# Patient Record
Sex: Female | Born: 1950 | Race: Black or African American | Hispanic: No | Marital: Single | Smoking: Current every day smoker
Health system: Southern US, Community
[De-identification: ages and names within clinical notes are randomized; demographics above are authoritative.]

## PROBLEM LIST (undated history)

## (undated) DIAGNOSIS — Z5189 Encounter for other specified aftercare: Secondary | ICD-10-CM

## (undated) DIAGNOSIS — Z853 Personal history of malignant neoplasm of breast: Secondary | ICD-10-CM

## (undated) DIAGNOSIS — J439 Emphysema, unspecified: Secondary | ICD-10-CM

## (undated) DIAGNOSIS — F419 Anxiety disorder, unspecified: Secondary | ICD-10-CM

## (undated) DIAGNOSIS — IMO0001 Reserved for inherently not codable concepts without codable children: Secondary | ICD-10-CM

## (undated) DIAGNOSIS — D649 Anemia, unspecified: Secondary | ICD-10-CM

## (undated) HISTORY — PX: NEPHRECTOMY: SHX65

## (undated) HISTORY — DX: Emphysema, unspecified: J43.9

## (undated) HISTORY — DX: Personal history of malignant neoplasm of breast: Z85.3

## (undated) HISTORY — DX: Anemia, unspecified: D64.9

## (undated) HISTORY — DX: Encounter for other specified aftercare: Z51.89

---

## 1978-04-12 HISTORY — PX: OTHER SURGICAL HISTORY: SHX169

## 1993-04-12 HISTORY — PX: ABDOMINAL HYSTERECTOMY: SHX81

## 1994-04-12 HISTORY — PX: BREAST SURGERY: SHX581

## 1995-04-13 HISTORY — PX: OTHER SURGICAL HISTORY: SHX169

## 2011-04-13 DIAGNOSIS — IMO0001 Reserved for inherently not codable concepts without codable children: Secondary | ICD-10-CM

## 2011-04-13 HISTORY — DX: Reserved for inherently not codable concepts without codable children: IMO0001

## 2012-04-12 ENCOUNTER — Ambulatory Visit: Payer: Self-pay | Admitting: Hematology and Oncology

## 2012-04-12 HISTORY — PX: BREAST BIOPSY: SHX20

## 2012-04-27 ENCOUNTER — Emergency Department: Payer: Self-pay | Admitting: Emergency Medicine

## 2012-04-27 LAB — URINALYSIS, COMPLETE
Bacteria: NONE SEEN
Bilirubin,UR: NEGATIVE
Ketone: NEGATIVE
Leukocyte Esterase: NEGATIVE
RBC,UR: 1 /HPF (ref 0–5)
Specific Gravity: 1.005 (ref 1.003–1.030)

## 2012-04-27 LAB — CBC WITH DIFFERENTIAL/PLATELET
Comment - H1-Com1: NORMAL
Comment - H1-Com3: NORMAL
HCT: 45.4 % (ref 35.0–47.0)
MCH: 29.2 pg (ref 26.0–34.0)
MCHC: 33.9 g/dL (ref 32.0–36.0)
MCV: 86 fL (ref 80–100)
Monocytes: 13 %
RBC: 5.27 10*6/uL — ABNORMAL HIGH (ref 3.80–5.20)
RDW: 13.9 % (ref 11.5–14.5)
Segmented Neutrophils: 58 %

## 2012-04-27 LAB — BASIC METABOLIC PANEL
Anion Gap: 6 — ABNORMAL LOW (ref 7–16)
BUN: 17 mg/dL (ref 7–18)
Chloride: 106 mmol/L (ref 98–107)
Co2: 27 mmol/L (ref 21–32)
EGFR (African American): >60
EGFR (Non-African Amer.): >60
Glucose: 95 mg/dL (ref 65–99)
Osmolality: 279 (ref 275–301)
Potassium: 3.8 mmol/L (ref 3.5–5.1)
Sodium: 139 mmol/L (ref 136–145)

## 2012-04-29 LAB — URINE CULTURE

## 2012-05-01 ENCOUNTER — Ambulatory Visit: Payer: Self-pay | Admitting: Oncology

## 2012-05-03 LAB — CULTURE, BLOOD (SINGLE)

## 2012-05-13 ENCOUNTER — Ambulatory Visit: Payer: Self-pay | Admitting: Oncology

## 2012-05-16 LAB — CBC CANCER CENTER
Basophil #: 0.1 x10 3/mm (ref 0.0–0.1)
Basophil %: 1.3 %
Eosinophil #: 0.1 x10 3/mm (ref 0.0–0.7)
Eosinophil %: 1.3 %
Monocyte #: 0.7 x10 3/mm (ref 0.2–0.9)
Monocyte %: 12.3 %
Platelet: 210 x10 3/mm (ref 150–440)
RBC: 5.24 10*6/uL — ABNORMAL HIGH (ref 3.80–5.20)
RDW: 14.2 % (ref 11.5–14.5)

## 2012-05-16 LAB — COMPREHENSIVE METABOLIC PANEL
Alkaline Phosphatase: 68 U/L (ref 50–136)
Calcium, Total: 9.7 mg/dL (ref 8.5–10.1)
Chloride: 102 mmol/L (ref 98–107)
Co2: 29 mmol/L (ref 21–32)
EGFR (Non-African Amer.): >60
Glucose: 100 mg/dL — ABNORMAL HIGH (ref 65–99)
SGOT(AST): 20 U/L (ref 15–37)
SGPT (ALT): 25 U/L (ref 12–78)
Sodium: 141 mmol/L (ref 136–145)
Total Protein: 8.2 g/dL (ref 6.4–8.2)

## 2012-05-22 ENCOUNTER — Ambulatory Visit: Payer: Self-pay | Admitting: Hematology and Oncology

## 2012-05-30 LAB — CBC CANCER CENTER
Basophil %: 1.2 %
Eosinophil #: 0.1 x10 3/mm (ref 0.0–0.7)
HGB: 14.7 g/dL (ref 12.0–16.0)
Lymphocyte #: 1.4 x10 3/mm (ref 1.0–3.6)
MCH: 28.6 pg (ref 26.0–34.0)
MCHC: 33.1 g/dL (ref 32.0–36.0)
Neutrophil #: 2.4 x10 3/mm (ref 1.4–6.5)
Neutrophil %: 54.7 %
RDW: 14.2 % (ref 11.5–14.5)

## 2012-05-30 LAB — COMPREHENSIVE METABOLIC PANEL
Albumin: 3.7 g/dL (ref 3.4–5.0)
Alkaline Phosphatase: 67 U/L (ref 50–136)
Anion Gap: 8 (ref 7–16)
BUN: 15 mg/dL (ref 7–18)
Co2: 29 mmol/L (ref 21–32)
EGFR (African American): >60
Glucose: 121 mg/dL — ABNORMAL HIGH (ref 65–99)
Osmolality: 283 (ref 275–301)
Potassium: 4.4 mmol/L (ref 3.5–5.1)
SGOT(AST): 24 U/L (ref 15–37)
SGPT (ALT): 30 U/L (ref 12–78)
Sodium: 141 mmol/L (ref 136–145)
Total Protein: 7.7 g/dL (ref 6.4–8.2)

## 2012-05-30 LAB — PROTIME-INR
INR: 0.9
Prothrombin Time: 12.1 secs (ref 11.5–14.7)

## 2012-05-31 ENCOUNTER — Encounter: Payer: Self-pay | Admitting: Nurse Practitioner

## 2012-05-31 ENCOUNTER — Encounter: Payer: Self-pay | Admitting: Cardiothoracic Surgery

## 2012-06-05 ENCOUNTER — Ambulatory Visit: Payer: Self-pay | Admitting: Vascular Surgery

## 2012-06-06 ENCOUNTER — Ambulatory Visit: Payer: Self-pay | Admitting: Hematology and Oncology

## 2012-06-06 LAB — COMPREHENSIVE METABOLIC PANEL
Alkaline Phosphatase: 67 U/L (ref 50–136)
Anion Gap: 10 (ref 7–16)
BUN: 10 mg/dL (ref 7–18)
Bilirubin,Total: 0.3 mg/dL (ref 0.2–1.0)
Calcium, Total: 8.9 mg/dL (ref 8.5–10.1)
Chloride: 104 mmol/L (ref 98–107)
Glucose: 107 mg/dL — ABNORMAL HIGH (ref 65–99)
Osmolality: 283 (ref 275–301)
SGOT(AST): 16 U/L (ref 15–37)
Sodium: 142 mmol/L (ref 136–145)
Total Protein: 7.5 g/dL (ref 6.4–8.2)

## 2012-06-06 LAB — CBC CANCER CENTER
Basophil #: 0.1 x10 3/mm (ref 0.0–0.1)
Basophil %: 1.1 %
Eosinophil %: 1.3 %
HCT: 42.7 % (ref 35.0–47.0)
Lymphocyte #: 1.8 x10 3/mm (ref 1.0–3.6)
Lymphocyte %: 34.7 %
Monocyte #: 0.6 x10 3/mm (ref 0.2–0.9)
Monocyte %: 11.1 %
Neutrophil #: 2.7 x10 3/mm (ref 1.4–6.5)
Platelet: 195 x10 3/mm (ref 150–440)
RDW: 14 % (ref 11.5–14.5)

## 2012-06-10 ENCOUNTER — Ambulatory Visit: Payer: Self-pay | Admitting: Oncology

## 2012-06-10 ENCOUNTER — Encounter: Payer: Self-pay | Admitting: Cardiothoracic Surgery

## 2012-06-10 ENCOUNTER — Encounter: Payer: Self-pay | Admitting: Nurse Practitioner

## 2012-06-21 LAB — CBC CANCER CENTER
Basophil #: 0 x10 3/mm (ref 0.0–0.1)
Eosinophil #: 0 x10 3/mm (ref 0.0–0.7)
Lymphocyte #: 2.4 x10 3/mm (ref 1.0–3.6)
Lymphocyte %: 38.9 %
MCH: 29.3 pg (ref 26.0–34.0)
MCV: 87 fL (ref 80–100)
Neutrophil %: 44.7 %
Platelet: 182 x10 3/mm (ref 150–440)

## 2012-06-21 LAB — COMPREHENSIVE METABOLIC PANEL
Albumin: 3.9 g/dL (ref 3.4–5.0)
Anion Gap: 6 — ABNORMAL LOW (ref 7–16)
Chloride: 106 mmol/L (ref 98–107)
Creatinine: 0.87 mg/dL (ref 0.60–1.30)
EGFR (African American): >60
Glucose: 81 mg/dL (ref 65–99)
SGPT (ALT): 71 U/L (ref 12–78)
Total Protein: 7.9 g/dL (ref 6.4–8.2)

## 2012-06-27 LAB — COMPREHENSIVE METABOLIC PANEL
Albumin: 3.7 g/dL (ref 3.4–5.0)
Alkaline Phosphatase: 72 U/L (ref 50–136)
Calcium, Total: 9 mg/dL (ref 8.5–10.1)
Chloride: 103 mmol/L (ref 98–107)
Co2: 28 mmol/L (ref 21–32)
Creatinine: 1 mg/dL (ref 0.60–1.30)
EGFR (African American): >60
EGFR (Non-African Amer.): >60
SGOT(AST): 16 U/L (ref 15–37)
Sodium: 140 mmol/L (ref 136–145)

## 2012-06-27 LAB — CBC CANCER CENTER
Basophil %: 1.4 %
HCT: 41.4 % (ref 35.0–47.0)
HGB: 13.8 g/dL (ref 12.0–16.0)
MCHC: 33.4 g/dL (ref 32.0–36.0)
MCV: 85 fL (ref 80–100)
Monocyte #: 0.9 x10 3/mm (ref 0.2–0.9)
Monocyte %: 19.5 %
Platelet: 256 x10 3/mm (ref 150–440)
RBC: 4.87 10*6/uL (ref 3.80–5.20)
RDW: 15.3 % — ABNORMAL HIGH (ref 11.5–14.5)
WBC: 4.7 x10 3/mm (ref 3.6–11.0)

## 2012-07-11 ENCOUNTER — Encounter: Payer: Self-pay | Admitting: Cardiothoracic Surgery

## 2012-07-11 ENCOUNTER — Ambulatory Visit: Payer: Self-pay | Admitting: Oncology

## 2012-07-11 ENCOUNTER — Encounter: Payer: Self-pay | Admitting: Nurse Practitioner

## 2012-07-18 LAB — CBC CANCER CENTER
Basophil %: 1 %
Eosinophil %: 0.4 %
HCT: 39.7 % (ref 35.0–47.0)
HGB: 13.2 g/dL (ref 12.0–16.0)
Lymphocyte #: 1.3 x10 3/mm (ref 1.0–3.6)
Lymphocyte %: 27.9 %
MCHC: 33.3 g/dL (ref 32.0–36.0)
Monocyte #: 0.9 x10 3/mm (ref 0.2–0.9)
Neutrophil #: 2.5 x10 3/mm (ref 1.4–6.5)
Neutrophil %: 51.8 %
Platelet: 236 x10 3/mm (ref 150–440)
RDW: 15.1 % — ABNORMAL HIGH (ref 11.5–14.5)

## 2012-07-18 LAB — COMPREHENSIVE METABOLIC PANEL
Anion Gap: 8 (ref 7–16)
BUN: 15 mg/dL (ref 7–18)
Bilirubin,Total: 0.3 mg/dL (ref 0.2–1.0)
Chloride: 103 mmol/L (ref 98–107)
Co2: 29 mmol/L (ref 21–32)
Creatinine: 1.19 mg/dL (ref 0.60–1.30)
EGFR (Non-African Amer.): 50 — ABNORMAL LOW
Osmolality: 280 (ref 275–301)
Potassium: 3.6 mmol/L (ref 3.5–5.1)
SGOT(AST): 20 U/L (ref 15–37)
SGPT (ALT): 28 U/L (ref 12–78)
Sodium: 140 mmol/L (ref 136–145)
Total Protein: 7.2 g/dL (ref 6.4–8.2)

## 2012-07-26 LAB — CBC CANCER CENTER
Basophil %: 1.5 %
Eosinophil #: 0 x10 3/mm (ref 0.0–0.7)
Lymphocyte %: 76.3 %
RBC: 4.59 10*6/uL (ref 3.80–5.20)
RDW: 15.2 % — ABNORMAL HIGH (ref 11.5–14.5)
WBC: 1.4 x10 3/mm — CL (ref 3.6–11.0)

## 2012-07-26 LAB — BASIC METABOLIC PANEL
Anion Gap: 3 — ABNORMAL LOW (ref 7–16)
BUN: 14 mg/dL (ref 7–18)
Calcium, Total: 8.6 mg/dL (ref 8.5–10.1)
Chloride: 108 mmol/L — ABNORMAL HIGH (ref 98–107)
Co2: 29 mmol/L (ref 21–32)
Creatinine: 0.9 mg/dL (ref 0.60–1.30)
EGFR (African American): >60
EGFR (Non-African Amer.): >60
Glucose: 116 mg/dL — ABNORMAL HIGH (ref 65–99)
Sodium: 140 mmol/L (ref 136–145)

## 2012-08-08 LAB — CBC CANCER CENTER
Basophil #: 0.1 x10 3/mm (ref 0.0–0.1)
Basophil %: 1.1 %
Eosinophil %: 0.7 %
HCT: 40.6 % (ref 35.0–47.0)
Lymphocyte #: 1.5 x10 3/mm (ref 1.0–3.6)
Lymphocyte %: 30.5 %
MCH: 27.9 pg (ref 26.0–34.0)
MCV: 85 fL (ref 80–100)
Neutrophil %: 49.1 %
RBC: 4.75 10*6/uL (ref 3.80–5.20)
WBC: 4.9 x10 3/mm (ref 3.6–11.0)

## 2012-08-08 LAB — COMPREHENSIVE METABOLIC PANEL
Albumin: 3.4 g/dL (ref 3.4–5.0)
Anion Gap: 4 — ABNORMAL LOW (ref 7–16)
Bilirubin,Total: 0.2 mg/dL (ref 0.2–1.0)
Calcium, Total: 8.8 mg/dL (ref 8.5–10.1)
Co2: 29 mmol/L (ref 21–32)
EGFR (African American): >60
EGFR (Non-African Amer.): >60
Osmolality: 281 (ref 275–301)
Potassium: 3.7 mmol/L (ref 3.5–5.1)
SGOT(AST): 21 U/L (ref 15–37)
SGPT (ALT): 28 U/L (ref 12–78)
Sodium: 141 mmol/L (ref 136–145)
Total Protein: 6.9 g/dL (ref 6.4–8.2)

## 2012-08-10 ENCOUNTER — Ambulatory Visit: Payer: Self-pay | Admitting: Oncology

## 2012-08-29 ENCOUNTER — Ambulatory Visit: Payer: Self-pay | Admitting: Hematology and Oncology

## 2012-08-30 LAB — CBC CANCER CENTER
Eosinophil #: 0 x10 3/mm (ref 0.0–0.7)
Eosinophil %: 0.5 %
HCT: 39.2 % (ref 35.0–47.0)
HGB: 13.1 g/dL (ref 12.0–16.0)
Lymphocyte #: 1.6 x10 3/mm (ref 1.0–3.6)
Lymphocyte %: 26 %
MCHC: 33.5 g/dL (ref 32.0–36.0)
MCV: 84 fL (ref 80–100)
Monocyte %: 15.3 %
Neutrophil #: 3.6 x10 3/mm (ref 1.4–6.5)
Platelet: 246 x10 3/mm (ref 150–440)
RDW: 15.7 % — ABNORMAL HIGH (ref 11.5–14.5)
WBC: 6.3 x10 3/mm (ref 3.6–11.0)

## 2012-08-30 LAB — COMPREHENSIVE METABOLIC PANEL
Anion Gap: 10 (ref 7–16)
Bilirubin,Total: 0.3 mg/dL (ref 0.2–1.0)
Calcium, Total: 9.1 mg/dL (ref 8.5–10.1)
Chloride: 105 mmol/L (ref 98–107)
Osmolality: 284 (ref 275–301)
Potassium: 3.8 mmol/L (ref 3.5–5.1)
SGOT(AST): 17 U/L (ref 15–37)
Sodium: 142 mmol/L (ref 136–145)

## 2012-09-07 ENCOUNTER — Ambulatory Visit: Payer: Self-pay | Admitting: Hematology and Oncology

## 2012-09-08 LAB — CBC CANCER CENTER
Basophil %: 1.3 %
Eosinophil #: 0.3 x10 3/mm (ref 0.0–0.7)
Eosinophil %: 7.6 %
HGB: 12.4 g/dL (ref 12.0–16.0)
Lymphocyte %: 32.8 %
MCH: 28.2 pg (ref 26.0–34.0)
MCV: 85 fL (ref 80–100)
Monocyte %: 16.6 %
Neutrophil #: 1.7 x10 3/mm (ref 1.4–6.5)
Platelet: 195 x10 3/mm (ref 150–440)
RBC: 4.41 10*6/uL (ref 3.80–5.20)
RDW: 16.1 % — ABNORMAL HIGH (ref 11.5–14.5)

## 2012-09-08 LAB — BASIC METABOLIC PANEL
Anion Gap: 8 (ref 7–16)
BUN: 15 mg/dL (ref 7–18)
Chloride: 106 mmol/L (ref 98–107)
Co2: 28 mmol/L (ref 21–32)
EGFR (Non-African Amer.): >60
Potassium: 3.5 mmol/L (ref 3.5–5.1)

## 2012-09-10 ENCOUNTER — Ambulatory Visit: Payer: Self-pay | Admitting: Oncology

## 2012-09-14 LAB — BASIC METABOLIC PANEL
Anion Gap: 11 (ref 7–16)
BUN: 15 mg/dL (ref 7–18)
Calcium, Total: 9 mg/dL (ref 8.5–10.1)
Chloride: 101 mmol/L (ref 98–107)
Co2: 27 mmol/L (ref 21–32)
Creatinine: 1.03 mg/dL (ref 0.60–1.30)
Glucose: 91 mg/dL (ref 65–99)
Osmolality: 278 (ref 275–301)
Potassium: 3.7 mmol/L (ref 3.5–5.1)

## 2012-09-14 LAB — CBC CANCER CENTER
HCT: 36.4 % (ref 35.0–47.0)
Lymphocyte %: 60.8 %
MCH: 29.3 pg (ref 26.0–34.0)
MCHC: 34.8 g/dL (ref 32.0–36.0)
Monocyte #: 0.1 x10 3/mm — ABNORMAL LOW (ref 0.2–0.9)
Monocyte %: 4.2 %
Neutrophil #: 0.5 x10 3/mm — ABNORMAL LOW (ref 1.4–6.5)
Neutrophil %: 27.8 %
RDW: 16 % — ABNORMAL HIGH (ref 11.5–14.5)
WBC: 1.8 x10 3/mm — CL (ref 3.6–11.0)

## 2012-09-29 LAB — COMPREHENSIVE METABOLIC PANEL WITH GFR
Albumin: 3.3 g/dL — ABNORMAL LOW
Alkaline Phosphatase: 76 U/L
Anion Gap: 6 — ABNORMAL LOW
BUN: 16 mg/dL
Bilirubin,Total: 0.2 mg/dL
Calcium, Total: 8.8 mg/dL
Chloride: 105 mmol/L
Co2: 29 mmol/L
Creatinine: 1.17 mg/dL
EGFR (African American): 59 — ABNORMAL LOW
EGFR (Non-African Amer.): 51 — ABNORMAL LOW
Glucose: 110 mg/dL — ABNORMAL HIGH
Osmolality: 281
Potassium: 3.3 mmol/L — ABNORMAL LOW
SGOT(AST): 15 U/L
SGPT (ALT): 26 U/L
Sodium: 140 mmol/L
Total Protein: 6.8 g/dL

## 2012-09-29 LAB — CBC CANCER CENTER
Basophil #: 0.1 x10 3/mm (ref 0.0–0.1)
Basophil %: 1.1 %
Eosinophil #: 0 x10 3/mm (ref 0.0–0.7)
HGB: 12.8 g/dL (ref 12.0–16.0)
Lymphocyte #: 1.7 x10 3/mm (ref 1.0–3.6)
MCH: 28.6 pg (ref 26.0–34.0)
MCHC: 33.6 g/dL (ref 32.0–36.0)
MCV: 85 fL (ref 80–100)
Monocyte #: 0.9 x10 3/mm (ref 0.2–0.9)
Monocyte %: 17.1 %
Neutrophil #: 2.3 x10 3/mm (ref 1.4–6.5)

## 2012-10-10 ENCOUNTER — Ambulatory Visit: Payer: Self-pay | Admitting: Oncology

## 2012-10-10 ENCOUNTER — Ambulatory Visit (INDEPENDENT_AMBULATORY_CARE_PROVIDER_SITE_OTHER): Payer: BC Managed Care – PPO | Admitting: General Surgery

## 2012-10-10 ENCOUNTER — Encounter: Payer: Self-pay | Admitting: General Surgery

## 2012-10-10 VITALS — BP 120/70 | HR 74 | Resp 12 | Ht 67.0 in | Wt 174.0 lb

## 2012-10-10 DIAGNOSIS — C50912 Malignant neoplasm of unspecified site of left female breast: Secondary | ICD-10-CM

## 2012-10-10 DIAGNOSIS — C50919 Malignant neoplasm of unspecified site of unspecified female breast: Secondary | ICD-10-CM

## 2012-10-10 DIAGNOSIS — D485 Neoplasm of uncertain behavior of skin: Secondary | ICD-10-CM

## 2012-10-10 NOTE — Progress Notes (Addendum)
Patient ID: [name] [name], female   DOB: 1951/02/25, 62 y.o.   MRN: [id number]  Chief Complaint  Patient presents with  . Other    Breast Discussion    HPI [name] [name] is a 62 y.o. female who presents for a breast discussion. The patient has a history of breast cancer in the right breast diagnosed in 1996. She underwent a right mastectomy at that time. She also did chemotherapy treatments. She has a paternal grandmother with a history of breast cancer.  She states she noticed back in January 2014 that her left breasts had begun to undergoing changes and shortly after this appreciated swelling in her left arm.  She states the swelling has decreased over time, since the initiation of neoadjuvant chemotherapy, and "is almost back down to normal now".   Left breast changes were noted within the last 6 months.  The patient's original breast biopsy was completed by [name], M.D. Power port was placed by [name], M.D. The patient is seen now to discuss options for management of this very advanced breast cancer. HPI  Past Medical History  Diagnosis Date  . Personal history of malignant neoplasm of breast     Past Surgical History  Procedure Laterality Date  . Oophrectomy  1980  . Abdominal hysterectomy  1995  . Breast surgery Right 1996    mastectomy    Family History  Problem Relation Age of Onset  . Cancer Father   . Heart disease Sister   . Diabetes Sister   . Heart disease Brother   . Cancer Paternal Grandmother     breast    Social History History  Substance Use Topics  . Smoking status: Current Every Day Smoker -- 1.00 packs/day for 25 years    Types: Cigarettes  . Smokeless tobacco: Never Used  . Alcohol Use: No    Allergies  Allergen Reactions  . Sulfa Antibiotics Nausea And Vomiting    headache    Current Outpatient Prescriptions  Medication Sig Dispense Refill  . acetaminophen (TYLENOL) 325 MG tablet Take 650 mg by mouth every 6 (six) hours as  needed for pain.      . potassium chloride (K-DUR) 10 MEQ tablet Take 20 mEq by mouth 2 (two) times daily.      . prochlorperazine (COMPAZINE) 10 MG tablet Take 10 mg by mouth every 6 (six) hours as needed.      . traMADol (ULTRAM) 50 MG tablet Take 50 mg by mouth every 4 (four) hours as needed for pain.       No current facility-administered medications for this visit.    Review of Systems Review of Systems  Constitutional: Negative.   Respiratory: Negative.   Cardiovascular: Negative.     Blood pressure 120/70, pulse 74, resp. rate 12, height 5\' 7"  (1.702 m), weight 174 lb (78.926 kg).  Physical Exam Physical Exam  Constitutional: She appears well-developed and well-nourished.  Neck: No thyromegaly present.  Cardiovascular: Normal rate, regular rhythm and normal heart sounds.   No murmur heard. Pulmonary/Chest: Effort normal and breath sounds normal. Left breast exhibits inverted nipple, mass and skin change. Left breast exhibits no nipple discharge and no tenderness. Breasts are asymmetrical.    Nicely healed right mastectomy site.   8 x 10 cm mass on the left upper aspect of the breast.    Extensive skin changes are noted beginning approximately 5 cm below the clavicle midclavicular line extending to at least the midline over the sternum  down to and below the inframammary fold and extending laterally past the mid axillary line. It's unclear with her this is all tumor related or a desmoplastic response.    Musculoskeletal:       Arms: Lymphadenopathy:    She has no cervical adenopathy.       Left axillary: No pectoral and no lateral adenopathy present.      Left: No supraclavicular adenopathy present.  2 cm skin cyst on left side of neck.  Neurological: She is alert.  Skin: Skin is warm and dry.    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  PET CT dated Sep 07, 2012 residual disease versus scarring within the chest wall. Interval  resolution of the diffuse intense metabolic activity involving the axilla and supraclavicular area. The patient is now completed 6 cycles of cyclophosphamide and docetaxel. Assessment    Advanced left breast cancer with extensive skin involvement versus desmoplastic response.     Plan    I recommended that the patient have punch biopsies at the periphery of the tumor. These were completed the midclavicular line 6 cm below the clavicle and 23 cm below the clavicle, medially over the sternum and laterally at the posterior axillary line. This would recommended to help better to ligate the amount of soft tissue that would be lost and the need for plastic surgery reconstruction. A total of 10 cc of 0.5% Xylocaine with 0.25% Marcaine with 1-200,000 units of epinephrine was utilized well tolerated. 2.5 mm punch biopsies were obtained from the above noted locations. Each incision was closed with a 5-0 nylon simple suture. Telfa and Tegaderm dressing applied. The patient tolerated the procedure very well.  The patient will be contacted with the biopsy results are available. If she is going to require flap coverage, smoking cessation will be essential to minimize flap loss.        [name] [name] 10/10/2012, 8:33 PM

## 2012-10-10 NOTE — Addendum Note (Signed)
Addended by: [name] on: 10/10/2012 09:11 PM   Modules accepted: Orders

## 2012-10-10 NOTE — Patient Instructions (Signed)

## 2012-10-11 LAB — CBC CANCER CENTER
Basophil #: 0 x10 3/mm (ref 0.0–0.1)
Basophil %: 0.3 %
Eosinophil #: 0 x10 3/mm (ref 0.0–0.7)
Eosinophil %: 1.8 %
HCT: 36.7 % (ref 35.0–47.0)
HGB: 12.3 g/dL (ref 12.0–16.0)
Lymphocyte %: 62.4 %
Lymphs Abs: 1.5 x10 3/mm (ref 1.0–3.6)
MCH: 28.4 pg (ref 26.0–34.0)
MCHC: 33.7 g/dL (ref 32.0–36.0)
MCV: 84 fL (ref 80–100)
Monocyte #: 0.7 x10 3/mm (ref 0.2–0.9)
Monocyte %: 27.4 %
Neutrophil #: 0.2 x10 3/mm — ABNORMAL LOW (ref 1.4–6.5)
Neutrophil %: 8.1 %
Platelet: 195 x10 3/mm (ref 150–440)
RBC: 4.34 x10 6/mm (ref 3.80–5.20)
RDW: 16.5 % — ABNORMAL HIGH (ref 11.5–14.5)
WBC: 2.4 x10 3/mm — ABNORMAL LOW (ref 3.6–11.0)

## 2012-10-11 LAB — COMPREHENSIVE METABOLIC PANEL
Albumin: 3.3 g/dL — ABNORMAL LOW (ref 3.4–5.0)
Alkaline Phosphatase: 82 U/L (ref 50–136)
BUN: 12 mg/dL (ref 7–18)
Bilirubin,Total: 0.3 mg/dL (ref 0.2–1.0)
Chloride: 105 mmol/L (ref 98–107)
Co2: 30 mmol/L (ref 21–32)
EGFR (African American): >60
EGFR (Non-African Amer.): 56 — ABNORMAL LOW
Glucose: 88 mg/dL (ref 65–99)
Osmolality: 286 (ref 275–301)
Potassium: 3.4 mmol/L — ABNORMAL LOW (ref 3.5–5.1)
SGOT(AST): 14 U/L — ABNORMAL LOW (ref 15–37)
SGPT (ALT): 24 U/L (ref 12–78)
Sodium: 144 mmol/L (ref 136–145)

## 2012-10-12 ENCOUNTER — Telehealth: Payer: Self-pay | Admitting: General Surgery

## 2012-10-12 DIAGNOSIS — C50912 Malignant neoplasm of unspecified site of left female breast: Secondary | ICD-10-CM

## 2012-10-12 LAB — PATHOLOGY

## 2012-10-12 MED ORDER — LETROZOLE 2.5 MG PO TABS: 2.5000 mg | ORAL_TABLET | Freq: Every day | ORAL | Status: DC

## 2012-10-12 NOTE — Telephone Encounter (Signed)
The 4 quadrant biopsies completed earlier this week showed metastatic breast cancer in all samples. This extended from 6 cm below the clavicle to 23 cm below the clavicle, to the sternum and to the posterior axillary line.  The likelihood of surgical resection to clear margins is very small.  In discussion with Ronald Lobo, M.D. from pathology service as well as Shyrl Numbers, M.D.the patient's medical oncologist were going make an effort to achieve better control with the use of antiestrogen therapy.  The patient will be placed on Femara, 2.5 mg daily. Maximal effect may take 3-6 months.  The patient is amenable to this trial.  She will return the week of 10/16/2012 for suture removal from her biopsies. We'll plan for an office followup in 6 weeks to assess her tolerance of antiestrogen therapy.

## 2012-10-19 ENCOUNTER — Ambulatory Visit (INDEPENDENT_AMBULATORY_CARE_PROVIDER_SITE_OTHER): Payer: BC Managed Care – PPO | Admitting: *Deleted

## 2012-10-19 DIAGNOSIS — C50912 Malignant neoplasm of unspecified site of left female breast: Secondary | ICD-10-CM

## 2012-10-19 DIAGNOSIS — C792 Secondary malignant neoplasm of skin: Secondary | ICD-10-CM

## 2012-10-19 DIAGNOSIS — C50919 Malignant neoplasm of unspecified site of unspecified female breast: Secondary | ICD-10-CM

## 2012-10-19 NOTE — Patient Instructions (Addendum)
The patient is aware to call back for any questions or concerns. Follow up as scheduled

## 2012-10-19 NOTE — Progress Notes (Signed)
The sutures were removed from 4 different areas left chest and steri strips applied. Patient advised that steri strips will fall off on there on. If any questions or concerns she is to call our office.

## 2012-11-10 ENCOUNTER — Ambulatory Visit: Payer: Self-pay | Admitting: Oncology

## 2012-11-16 LAB — COMPREHENSIVE METABOLIC PANEL
Albumin: 3.6 g/dL (ref 3.4–5.0)
Alkaline Phosphatase: 99 U/L (ref 50–136)
Anion Gap: 9 (ref 7–16)
Bilirubin,Total: 0.2 mg/dL (ref 0.2–1.0)
Calcium, Total: 9.2 mg/dL (ref 8.5–10.1)
Co2: 28 mmol/L (ref 21–32)
EGFR (African American): 57 — ABNORMAL LOW
Osmolality: 290 (ref 275–301)
Potassium: 3.8 mmol/L (ref 3.5–5.1)
SGPT (ALT): 31 U/L (ref 12–78)
Total Protein: 7.5 g/dL (ref 6.4–8.2)

## 2012-11-16 LAB — CBC CANCER CENTER
Basophil #: 0 x10 3/mm (ref 0.0–0.1)
Eosinophil #: 0.2 x10 3/mm (ref 0.0–0.7)
Eosinophil %: 3.9 %
HCT: 40.4 % (ref 35.0–47.0)
HGB: 13.9 g/dL (ref 12.0–16.0)
Lymphocyte #: 1.9 x10 3/mm (ref 1.0–3.6)
Lymphocyte %: 38 %
MCHC: 34.4 g/dL (ref 32.0–36.0)
Monocyte #: 0.8 x10 3/mm (ref 0.2–0.9)
Platelet: 194 x10 3/mm (ref 150–440)
WBC: 4.9 x10 3/mm (ref 3.6–11.0)

## 2012-11-30 ENCOUNTER — Encounter: Payer: Self-pay | Admitting: General Surgery

## 2012-11-30 ENCOUNTER — Ambulatory Visit (INDEPENDENT_AMBULATORY_CARE_PROVIDER_SITE_OTHER): Payer: BC Managed Care – PPO | Admitting: General Surgery

## 2012-11-30 VITALS — BP 142/76 | HR 72 | Resp 14 | Ht 67.0 in | Wt 174.0 lb

## 2012-11-30 DIAGNOSIS — C50919 Malignant neoplasm of unspecified site of unspecified female breast: Secondary | ICD-10-CM

## 2012-11-30 DIAGNOSIS — C50912 Malignant neoplasm of unspecified site of left female breast: Secondary | ICD-10-CM

## 2012-11-30 DIAGNOSIS — C792 Secondary malignant neoplasm of skin: Secondary | ICD-10-CM

## 2012-11-30 NOTE — Patient Instructions (Signed)
Patient to return in 3 months

## 2012-11-30 NOTE — Progress Notes (Signed)
Patient ID: [name] [name], female   DOB: 1950-05-20, 62 y.o.   MRN: [id number]  Chief Complaint  Patient presents with  . Breast Cancer Long Term Follow Up    6 week follow up    HPI [name] [name] is a 62 y.o. female who presents for a 6 week follow up from her left breast cancer. States she is having some side effects of the medication that she is on but is unable to remember the name of the medication. ( Femara) The side effects include night sweats, decreased appetite, fatigue, and weakness. Besides the side effects she is doing well. Bowels are moving well.   HPI  Past Medical History  Diagnosis Date  . Personal history of malignant neoplasm of breast     Past Surgical History  Procedure Laterality Date  . Oophrectomy  1980  . Abdominal hysterectomy  1995  . Breast surgery Right 1996    mastectomy    Family History  Problem Relation Age of Onset  . Cancer Father   . Heart disease Sister   . Diabetes Sister   . Heart disease Brother   . Cancer Paternal Grandmother     breast    Social History History  Substance Use Topics  . Smoking status: Current Every Day Smoker -- 1.00 packs/day for 25 years    Types: Cigarettes  . Smokeless tobacco: Never Used  . Alcohol Use: No    Allergies  Allergen Reactions  . Sulfa Antibiotics Nausea And Vomiting    headache    No current outpatient prescriptions on file.   No current facility-administered medications for this visit.    Review of Systems Review of Systems  Constitutional: Negative.   Respiratory: Negative.   Cardiovascular: Negative.     Blood pressure 142/76, pulse 72, resp. rate 14, height 5\' 7"  (1.702 m), weight 174 lb (78.926 kg).  Physical Exam Physical Exam  Constitutional: She is oriented to person, place, and time. She appears well-developed and well-nourished.  Neck: No thyromegaly present.  Cardiovascular: Normal rate, regular rhythm and normal heart sounds.   No murmur  heard. Pulmonary/Chest: Effort normal and breath sounds normal.  Warmth over the left breast. Well healed incision site.   Lymphadenopathy:    She has no cervical adenopathy.    She has no axillary adenopathy.  Neurological: She is alert and oriented to person, place, and time.  Skin: Skin is warm and dry.  Left chest wall looks a little less fibrous than on her past exams. There still diffuse warmth. Nodularity appears to be somewhat less prominent.  Data Reviewed Previous pathology showed evidence of invasive breast malignancy in all 4 quadrant biopsies.  Assessment    Advanced left breast cancer, failed adjuvant chemotherapy.     Plan    The patient's volume of disease precludes reasonable expectations of excision without significant volume reduction. The plan at present is to continue an aromatase inhibitor for 6 months to see its maximal effect.        [name] [name] 12/01/2012, 9:20 PM

## 2012-12-01 ENCOUNTER — Encounter: Payer: Self-pay | Admitting: General Surgery

## 2012-12-11 ENCOUNTER — Ambulatory Visit: Payer: Self-pay | Admitting: Hematology and Oncology

## 2013-02-28 ENCOUNTER — Encounter: Payer: Self-pay | Admitting: General Surgery

## 2013-02-28 ENCOUNTER — Ambulatory Visit (INDEPENDENT_AMBULATORY_CARE_PROVIDER_SITE_OTHER): Payer: BC Managed Care – PPO | Admitting: General Surgery

## 2013-02-28 VITALS — BP 142/82 | HR 84 | Resp 14 | Wt 176.0 lb

## 2013-02-28 DIAGNOSIS — C50912 Malignant neoplasm of unspecified site of left female breast: Secondary | ICD-10-CM

## 2013-02-28 DIAGNOSIS — C792 Secondary malignant neoplasm of skin: Secondary | ICD-10-CM

## 2013-02-28 DIAGNOSIS — C50919 Malignant neoplasm of unspecified site of unspecified female breast: Secondary | ICD-10-CM

## 2013-02-28 NOTE — Progress Notes (Signed)
Patient ID: [name] [name], female   DOB: 24-May-1950, 62 y.o.   MRN: [id number]  Chief Complaint  Patient presents with  . Follow-up    breast cancer    HPI [name] [name] is a 62 y.o. female here today following up for her left breast cancer. She states doing  well on the Femara besides hot flashes..  No new problems at this time.   The patient had been identified with extensive chest wall disease not thought amenable to surgical resection. She underwent neo- adjuvant chemotherapy with no discernible affect. Biopsies showed extensive disease consult covering at least a 12 inch area of the chest wall extending to the posterior axillary line. Hormonal therapy was thought reasonable, the patient has completed 4 months of this. She's tolerating the Femara well with minimal hot flashes. She has appreciated some softening of the breast as well as the skin of the left chest wall since beginning treatment. She is not experiencing any pain. HPI  Past Medical History  Diagnosis Date  . Personal history of malignant neoplasm of breast     Past Surgical History  Procedure Laterality Date  . Oophrectomy  1980  . Abdominal hysterectomy  1995  . Breast surgery Right 1996    mastectomy  . Breast biopsy Left 2014    Family History  Problem Relation Age of Onset  . Cancer Father   . Heart disease Sister   . Diabetes Sister   . Heart disease Brother   . Cancer Paternal Grandmother     breast    Social History History  Substance Use Topics  . Smoking status: Current Every Day Smoker -- 1.00 packs/day for 25 years    Types: Cigarettes  . Smokeless tobacco: Never Used  . Alcohol Use: No    Allergies  Allergen Reactions  . Sulfa Antibiotics Nausea And Vomiting    headache    Current Outpatient Prescriptions  Medication Sig Dispense Refill  . letrozole (FEMARA) 2.5 MG tablet Take 2.5 mg by mouth daily.       No current facility-administered medications for this visit.    Review  of Systems Review of Systems  Constitutional: Negative.   Respiratory: Negative.   Cardiovascular: Negative.     Blood pressure 142/82, pulse 84, resp. rate 14, weight 176 lb (79.833 kg).  Physical Exam Physical Exam  Constitutional: She is oriented to person, place, and time. She appears well-developed and well-nourished.  Eyes: No scleral icterus.  Cardiovascular: Normal rate, regular rhythm and normal heart sounds.   Pulmonary/Chest: Breath sounds normal. Left breast exhibits no inverted nipple, no mass, no nipple discharge, no skin change and no tenderness.  Left breast area there is some softening    Lymphadenopathy:    She has no cervical adenopathy.    She has no axillary adenopathy.  Neurological: She is alert and oriented to person, place, and time.  Skin: Skin is warm and dry.    Data Reviewed No new data.  Assessment    Extensive skin involvement and her underlying breast cancer, significant softening although still faint erythema over a 12 x 12" area of the left chest wall.    Plan    We'll go ahead and follow her up for an additional 2 months, with 6 months being the time of maximum antiestrogen affect. At that time, she would like to be a candidate for restaging and if no metastatic disease is appreciated assessment by plastic surgery for reconstruction of the left chest  wall would be considered.       [name] [name] 03/03/2013, 8:32 AM

## 2013-02-28 NOTE — Patient Instructions (Signed)
Patient to return in January 2015.  

## 2013-03-06 ENCOUNTER — Ambulatory Visit: Payer: Self-pay | Admitting: Hematology and Oncology

## 2013-03-06 LAB — CBC CANCER CENTER
Basophil #: 0 x10 3/mm (ref 0.0–0.1)
Basophil %: 0.6 %
Eosinophil #: 0.1 x10 3/mm (ref 0.0–0.7)
Eosinophil %: 3.1 %
HGB: 14.2 g/dL (ref 12.0–16.0)
MCHC: 32.9 g/dL (ref 32.0–36.0)
Monocyte #: 0.5 x10 3/mm (ref 0.2–0.9)
Monocyte %: 11.4 %
Neutrophil %: 46.1 %
Platelet: 185 x10 3/mm (ref 150–440)
RBC: 5.18 10*6/uL (ref 3.80–5.20)
WBC: 4 x10 3/mm (ref 3.6–11.0)

## 2013-03-06 LAB — BASIC METABOLIC PANEL
BUN: 15 mg/dL (ref 7–18)
Calcium, Total: 9.6 mg/dL (ref 8.5–10.1)
Chloride: 105 mmol/L (ref 98–107)
Co2: 29 mmol/L (ref 21–32)
Creatinine: 1.02 mg/dL (ref 0.60–1.30)
EGFR (African American): >60
Sodium: 143 mmol/L (ref 136–145)

## 2013-03-12 ENCOUNTER — Ambulatory Visit: Payer: Self-pay | Admitting: Hematology and Oncology

## 2013-03-12 LAB — CANCER ANTIGEN 27.29: CA 27.29: 17.9 U/mL (ref 0.0–38.6)

## 2013-03-16 ENCOUNTER — Ambulatory Visit (INDEPENDENT_AMBULATORY_CARE_PROVIDER_SITE_OTHER): Payer: BC Managed Care – PPO | Admitting: Podiatry

## 2013-03-16 ENCOUNTER — Ambulatory Visit (INDEPENDENT_AMBULATORY_CARE_PROVIDER_SITE_OTHER): Payer: BC Managed Care – PPO

## 2013-03-16 ENCOUNTER — Encounter: Payer: Self-pay | Admitting: Podiatry

## 2013-03-16 VITALS — BP 141/81 | HR 77 | Resp 16 | Ht 67.0 in | Wt 174.0 lb

## 2013-03-16 DIAGNOSIS — M2040 Other hammer toe(s) (acquired), unspecified foot: Secondary | ICD-10-CM

## 2013-03-16 DIAGNOSIS — M79671 Pain in right foot: Secondary | ICD-10-CM

## 2013-03-16 DIAGNOSIS — M779 Enthesopathy, unspecified: Secondary | ICD-10-CM

## 2013-03-16 DIAGNOSIS — M79609 Pain in unspecified limb: Secondary | ICD-10-CM

## 2013-03-16 MED ORDER — TRIAMCINOLONE ACETONIDE 10 MG/ML IJ SUSP
10.0000 mg | Freq: Once | INTRAMUSCULAR | Status: AC
  Administered 2013-03-16: 10 mg

## 2013-03-16 NOTE — Progress Notes (Signed)
   Subjective:    Patient ID: [name] [name], female    DOB: January 10, 1951, 62 y.o.   MRN: [id number]  HPI Comments: N PAIN L 5TH DIGIT  RIGHT FOOT D 1 M O SUDDEN C WORSE A WALKING, AM BAD, CLOSED IN SHOES T TYLENOL, CREAM   Toe Pain       Review of Systems  Constitutional: Negative.   HENT: Negative.   Eyes: Negative.   Respiratory: Negative.   Cardiovascular: Negative.   Gastrointestinal: Negative.   Genitourinary: Negative.   Musculoskeletal:       JOINT PAIN DIFFICULTY WALKING  Skin: Positive for color change.  Allergic/Immunologic: Negative.   Neurological: Negative.   Hematological: Negative.   Psychiatric/Behavioral: Negative.        Objective:   Physical Exam        Assessment & Plan:

## 2013-03-17 NOTE — Progress Notes (Signed)
Subjective:     Patient ID: [name] [name], female   DOB: [birth year], 62 y.o.   MRN: [id number]  Toe Pain    patient points to right foot stating I'm having a lot of pain between my fourth and fifth toes and I also have a mild bump on my big toe joint that gets sore when I wear shoe gear   Review of Systems  All other systems reviewed and are negative.       Objective:   Physical Exam  Nursing note and vitals reviewed. Constitutional: She is oriented to person, place, and time.  Cardiovascular: Intact distal pulses.   Musculoskeletal: Normal range of motion.  Neurological: She is oriented to person, place, and time.  Skin: Skin is warm.   patient is found to have gear irritation and keratotic tissue between the fourth and fifth toes on the right it's painful when pressed and structural HAV deformity right foot noted. Neurovascular status intact with cancer as a problem for the last year of which she took chemotherapy is finished at the current time     Assessment:     Inflamed right fourth intermetatarsal space with keratotic lesion formation and structural HAV deformity right    Plan:     H&P performed and condition explained along with x-ray review. Injected the fourth MPJ 3 mg dexamethasone Kenalog 5 mg Xylocaine and after appropriate numbness debrided the anterior fourth fifth toe and instructed on soaks. If symptoms were to persist get worse if any swelling or drainage were to occur patient is to let us know immediately

## 2013-04-06 ENCOUNTER — Ambulatory Visit: Payer: BC Managed Care – PPO | Admitting: Podiatry

## 2013-04-27 ENCOUNTER — Telehealth: Payer: Self-pay | Admitting: *Deleted

## 2013-04-27 MED ORDER — CEPHALEXIN 500 MG PO CAPS: 500.0000 mg | ORAL_CAPSULE | Freq: Two times a day (BID) | ORAL | Status: DC

## 2013-04-27 NOTE — Telephone Encounter (Signed)
PT CALLED SAID YOU WERE GOING TO GIVE HER AN ANTIBIOTIC FOR HER HAMMER TOE IF IT HAS NOT CLEARED UP. IS REQUESTING AN ANTIBIOTIC FOR HER TOE SENT TO [facility] [address] SAYS IT HAS NOT CLEARED UP AND HAS MADE AN APPT WITH YOU ON 2.6.15

## 2013-04-27 NOTE — Telephone Encounter (Signed)
Cephalexin 500mg  bid as long as not allergic

## 2013-04-27 NOTE — Telephone Encounter (Signed)
Per dr Paulla Dolly send over cephalexin 500mg  #14 1 refill [facility] [address]

## 2013-04-30 ENCOUNTER — Encounter: Payer: Self-pay | Admitting: General Surgery

## 2013-04-30 ENCOUNTER — Ambulatory Visit (INDEPENDENT_AMBULATORY_CARE_PROVIDER_SITE_OTHER): Payer: BC Managed Care – PPO | Admitting: General Surgery

## 2013-04-30 VITALS — BP 140/80 | HR 76 | Resp 12 | Ht 67.0 in | Wt 174.0 lb

## 2013-04-30 DIAGNOSIS — C50919 Malignant neoplasm of unspecified site of unspecified female breast: Secondary | ICD-10-CM

## 2013-04-30 DIAGNOSIS — C50912 Malignant neoplasm of unspecified site of left female breast: Secondary | ICD-10-CM

## 2013-04-30 NOTE — Patient Instructions (Addendum)
Patient to have a PET scan and labwork.

## 2013-04-30 NOTE — Progress Notes (Addendum)
Patient ID: [name] [name], female   DOB: Dec 05, 1950, 63 y.o.   MRN: [id number]  Chief Complaint  Patient presents with  . Follow-up    breast cancer    HPI [name] [name] is a 63 y.o. female here today following up for her left breast cancer. She states doing well on the Femara besides hot flashes.. No new problems at this time.   HPI  Past Medical History  Diagnosis Date  . Personal history of malignant neoplasm of breast     Past Surgical History  Procedure Laterality Date  . Oophrectomy  1980  . Abdominal hysterectomy  1995  . Breast surgery Right 1996    mastectomy  . Breast biopsy Left 2014    Family History  Problem Relation Age of Onset  . Cancer Father   . Heart disease Sister   . Diabetes Sister   . Heart disease Brother   . Cancer Paternal Grandmother     breast    Social History History  Substance Use Topics  . Smoking status: Current Every Day Smoker -- 1.00 packs/day for 25 years    Types: Cigarettes  . Smokeless tobacco: Never Used  . Alcohol Use: No    Allergies  Allergen Reactions  . Sulfa Antibiotics Nausea And Vomiting    headache    Current Outpatient Prescriptions  Medication Sig Dispense Refill  . cephALEXin (KEFLEX) 500 MG capsule Take 1 capsule (500 mg total) by mouth 2 (two) times daily.  14 capsule  1  . letrozole (FEMARA) 2.5 MG tablet Take 2.5 mg by mouth daily.       No current facility-administered medications for this visit.    Review of Systems Review of Systems  Constitutional: Negative.   Respiratory: Negative.   Cardiovascular: Negative.     Blood pressure 140/80, pulse 76, resp. rate 12, height 5\' 7"  (1.702 m), weight 174 lb (78.926 kg).  Physical Exam Physical Exam  Constitutional: She is oriented to person, place, and time. She appears well-developed and well-nourished.  Eyes: Conjunctivae are normal. No scleral icterus.  Neck: Neck supple.  Cardiovascular: Normal rate, regular rhythm and normal heart  sounds.   Pulmonary/Chest: Effort normal and breath sounds normal.    Left mastectomy site well healed . 15 by 20 faint redness left chest wall.   Lymphadenopathy:    She has no cervical adenopathy.    She has no axillary adenopathy.       Left: No supraclavicular adenopathy present.  Neurological: She is alert and oriented to person, place, and time.  Skin: Skin is warm and dry.    Data Reviewed No new data.  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     Plan     The case was discussed with Kennieth Francois, M.D. from medical oncology. There is improvement, but still suggestion of indolent disease after 6 months of Femara. A repeat PET scan will be obtained to assess for any metastatic disease. If negative, plastic surgery consultation for possible resection of the large area of the chest wall will be obtained. If adequate coverage is not felt possible, the patient may be a candidate for radiation without resection and ongoing aromatase inhibitor blockade. Follow up will take place after her PET scan and laboratory studies are available for review   The patient requested Ativan for use during her PET scan. She is amenable to having a driver. A  prescription for Ativan, 1 mg to be taken one hour prior to the scan was provided.    Patient has been scheduled for a PET scan at [facility] for 05-03-13 at 8 am (arrive 7:30 am). She is aware of date, time, and prep instructions.   This patient will also be sent to [facility] lab to have the following labs drawn today: CA 27-29.   Robert Bellow 04/30/2013, 8:44 PM   An

## 2013-05-01 LAB — CANCER ANTIGEN 27.29: CA 27.29: 13.7 U/mL (ref 0.0–38.6)

## 2013-05-03 ENCOUNTER — Ambulatory Visit: Payer: Self-pay | Admitting: General Surgery

## 2013-05-04 ENCOUNTER — Encounter: Payer: Self-pay | Admitting: General Surgery

## 2013-05-04 ENCOUNTER — Telehealth: Payer: Self-pay | Admitting: General Surgery

## 2013-05-04 NOTE — Telephone Encounter (Signed)
The patient was asked to return to call to discuss her recently completed PET/CT.

## 2013-05-07 ENCOUNTER — Telehealth: Payer: Self-pay | Admitting: *Deleted

## 2013-05-07 NOTE — Telephone Encounter (Signed)
Pt called and said that she missed her call on Friday about her results, so she was just returning the call to find out her results on her CT Scan.

## 2013-05-09 ENCOUNTER — Telehealth: Payer: Self-pay | Admitting: *Deleted

## 2013-05-09 NOTE — Telephone Encounter (Signed)
Notified pt, she agrees

## 2013-05-09 NOTE — Telephone Encounter (Signed)
Message copied by Carson Myrtle on Wed May 09, 2013 10:00 AM ------      Message from: Rea, Forest Gleason      Created: Wed May 09, 2013  8:35 AM       Please notify the patient that her recent PET scan showed a new area of concern. She should be contacted by Dr. Kallie Edward in the near future. At this time, surgery is not an option for her left breast, but we will continue her Femara.            If she does not hear from medical oncology by Monday, February 2 she should call them to arrange a followup. ------

## 2013-05-09 NOTE — Telephone Encounter (Signed)
Notified patient as instructed.   

## 2013-05-18 ENCOUNTER — Ambulatory Visit (INDEPENDENT_AMBULATORY_CARE_PROVIDER_SITE_OTHER): Payer: BC Managed Care – PPO | Admitting: Podiatry

## 2013-05-18 ENCOUNTER — Encounter: Payer: Self-pay | Admitting: Podiatry

## 2013-05-18 VITALS — BP 144/90 | HR 78 | Resp 16

## 2013-05-18 DIAGNOSIS — M2040 Other hammer toe(s) (acquired), unspecified foot: Secondary | ICD-10-CM

## 2013-05-18 DIAGNOSIS — L851 Acquired keratosis [keratoderma] palmaris et plantaris: Secondary | ICD-10-CM

## 2013-05-18 NOTE — Progress Notes (Signed)
Just checking my toe it is better

## 2013-05-18 NOTE — Progress Notes (Signed)
Subjective:     Patient ID: [name] [name], female   DOB: Jul 29, 1950, 63 y.o.   MRN: [id number]  HPI patient presents stating my foot is feeling some better but I wanted to check in between those toes   Review of Systems     Objective:   Physical Exam Neurovascular status is intact with no health history changes noted and keratotic lesion fourth interspace right and moderately tender    Assessment:     Interspace lesion fourth right is moderately tender when pressed    Plan:     Advised on watching this and if it breaks down again we will need to consider surgical intervention. Reappoint her recheck

## 2013-05-21 ENCOUNTER — Ambulatory Visit: Payer: Self-pay | Admitting: Hematology and Oncology

## 2013-05-23 LAB — CANCER ANTIGEN 27.29: CA 27.29: 21.5 U/mL (ref 0.0–38.6)

## 2013-06-06 LAB — CBC CANCER CENTER
Basophil #: 0.1 x10 3/mm (ref 0.0–0.1)
Basophil %: 0.9 %
Eosinophil #: 0.1 x10 3/mm (ref 0.0–0.7)
Eosinophil %: 1.9 %
HCT: 43.7 % (ref 35.0–47.0)
HGB: 14.2 g/dL (ref 12.0–16.0)
Lymphocyte #: 2.3 x10 3/mm (ref 1.0–3.6)
Lymphocyte %: 41.7 %
MCH: 28.2 pg (ref 26.0–34.0)
MCHC: 32.6 g/dL (ref 32.0–36.0)
MCV: 86 fL (ref 80–100)
MONO ABS: 0.5 x10 3/mm (ref 0.2–0.9)
Monocyte %: 8.7 %
Neutrophil #: 2.6 x10 3/mm (ref 1.4–6.5)
Neutrophil %: 46.8 %
Platelet: 209 x10 3/mm (ref 150–440)
RBC: 5.05 10*6/uL (ref 3.80–5.20)
RDW: 14.1 % (ref 11.5–14.5)
WBC: 5.6 x10 3/mm (ref 3.6–11.0)

## 2013-06-06 LAB — COMPREHENSIVE METABOLIC PANEL
ALBUMIN: 3.6 g/dL (ref 3.4–5.0)
ALK PHOS: 86 U/L
AST: 15 U/L (ref 15–37)
Anion Gap: 5 — ABNORMAL LOW (ref 7–16)
BILIRUBIN TOTAL: 0.3 mg/dL (ref 0.2–1.0)
BUN: 17 mg/dL (ref 7–18)
CHLORIDE: 105 mmol/L (ref 98–107)
CO2: 31 mmol/L (ref 21–32)
Calcium, Total: 10.2 mg/dL — ABNORMAL HIGH (ref 8.5–10.1)
Creatinine: 1 mg/dL (ref 0.60–1.30)
EGFR (Non-African Amer.): >60
Glucose: 101 mg/dL — ABNORMAL HIGH (ref 65–99)
OSMOLALITY: 283 (ref 275–301)
Potassium: 3.5 mmol/L (ref 3.5–5.1)
SGPT (ALT): 28 U/L (ref 12–78)
Sodium: 141 mmol/L (ref 136–145)
TOTAL PROTEIN: 7.7 g/dL (ref 6.4–8.2)

## 2013-06-10 ENCOUNTER — Ambulatory Visit: Payer: Self-pay | Admitting: Hematology and Oncology

## 2013-06-21 LAB — CBC CANCER CENTER
Basophil #: 0 x10 3/mm (ref 0.0–0.1)
Basophil %: 0.6 %
EOS ABS: 0.1 x10 3/mm (ref 0.0–0.7)
EOS PCT: 3.1 %
HCT: 43.6 % (ref 35.0–47.0)
HGB: 14.3 g/dL (ref 12.0–16.0)
LYMPHS PCT: 50.5 %
Lymphocyte #: 2.2 x10 3/mm (ref 1.0–3.6)
MCH: 28.3 pg (ref 26.0–34.0)
MCHC: 32.8 g/dL (ref 32.0–36.0)
MCV: 86 fL (ref 80–100)
Monocyte #: 0.5 x10 3/mm (ref 0.2–0.9)
Monocyte %: 12.5 %
NEUTROS ABS: 1.5 x10 3/mm (ref 1.4–6.5)
Neutrophil %: 33.3 %
Platelet: 177 x10 3/mm (ref 150–440)
RBC: 5.07 10*6/uL (ref 3.80–5.20)
RDW: 14.1 % (ref 11.5–14.5)
WBC: 4.4 x10 3/mm (ref 3.6–11.0)

## 2013-07-11 ENCOUNTER — Ambulatory Visit: Payer: Self-pay | Admitting: Hematology and Oncology

## 2013-08-14 ENCOUNTER — Ambulatory Visit: Payer: Self-pay | Admitting: Hematology and Oncology

## 2013-08-21 ENCOUNTER — Ambulatory Visit: Payer: Self-pay | Admitting: Hematology and Oncology

## 2013-08-21 LAB — CBC CANCER CENTER
Basophil #: 0 x10 3/mm (ref 0.0–0.1)
Basophil %: 0.8 %
Eosinophil #: 0.1 x10 3/mm (ref 0.0–0.7)
Eosinophil %: 2.8 %
HCT: 41 % (ref 35.0–47.0)
HGB: 13.7 g/dL (ref 12.0–16.0)
LYMPHS PCT: 42.6 %
Lymphocyte #: 2.2 x10 3/mm (ref 1.0–3.6)
MCH: 28.5 pg (ref 26.0–34.0)
MCHC: 33.5 g/dL (ref 32.0–36.0)
MCV: 85 fL (ref 80–100)
MONO ABS: 0.8 x10 3/mm (ref 0.2–0.9)
MONOS PCT: 15.1 %
Neutrophil #: 2 x10 3/mm (ref 1.4–6.5)
Neutrophil %: 38.7 %
Platelet: 187 x10 3/mm (ref 150–440)
RBC: 4.82 10*6/uL (ref 3.80–5.20)
RDW: 14.2 % (ref 11.5–14.5)
WBC: 5.1 x10 3/mm (ref 3.6–11.0)

## 2013-08-21 LAB — COMPREHENSIVE METABOLIC PANEL
ALT: 28 U/L (ref 12–78)
ANION GAP: 10 (ref 7–16)
AST: 17 U/L (ref 15–37)
Albumin: 3.6 g/dL (ref 3.4–5.0)
Alkaline Phosphatase: 87 U/L
BUN: 17 mg/dL (ref 7–18)
Bilirubin,Total: 0.2 mg/dL (ref 0.2–1.0)
Calcium, Total: 9.1 mg/dL (ref 8.5–10.1)
Chloride: 106 mmol/L (ref 98–107)
Co2: 27 mmol/L (ref 21–32)
Creatinine: 1 mg/dL (ref 0.60–1.30)
EGFR (African American): >60
GLUCOSE: 103 mg/dL — AB (ref 65–99)
Osmolality: 287 (ref 275–301)
Potassium: 3.8 mmol/L (ref 3.5–5.1)
Sodium: 143 mmol/L (ref 136–145)
Total Protein: 7.6 g/dL (ref 6.4–8.2)

## 2013-08-22 LAB — CANCER ANTIGEN 27.29: CA 27.29: 26.3 U/mL (ref 0.0–38.6)

## 2013-09-10 ENCOUNTER — Ambulatory Visit: Payer: Self-pay | Admitting: Hematology and Oncology

## 2013-09-20 LAB — CBC CANCER CENTER
BASOS PCT: 0.4 %
Basophil #: 0 x10 3/mm (ref 0.0–0.1)
Eosinophil #: 0.1 x10 3/mm (ref 0.0–0.7)
Eosinophil %: 2.9 %
HCT: 42.7 % (ref 35.0–47.0)
HGB: 14.1 g/dL (ref 12.0–16.0)
Lymphocyte #: 1.5 x10 3/mm (ref 1.0–3.6)
Lymphocyte %: 36.1 %
MCH: 28.6 pg (ref 26.0–34.0)
MCHC: 33.1 g/dL (ref 32.0–36.0)
MCV: 87 fL (ref 80–100)
Monocyte #: 0.5 x10 3/mm (ref 0.2–0.9)
Monocyte %: 13.1 %
NEUTROS ABS: 1.9 x10 3/mm (ref 1.4–6.5)
NEUTROS PCT: 47.5 %
PLATELETS: 177 x10 3/mm (ref 150–440)
RBC: 4.94 10*6/uL (ref 3.80–5.20)
RDW: 14.7 % — ABNORMAL HIGH (ref 11.5–14.5)
WBC: 4.1 x10 3/mm (ref 3.6–11.0)

## 2013-09-27 LAB — CBC CANCER CENTER
BASOS ABS: 0 x10 3/mm (ref 0.0–0.1)
Basophil %: 0.6 %
EOS PCT: 4 %
Eosinophil #: 0.2 x10 3/mm (ref 0.0–0.7)
HCT: 42.8 % (ref 35.0–47.0)
HGB: 14.2 g/dL (ref 12.0–16.0)
LYMPHS PCT: 34.9 %
Lymphocyte #: 1.4 x10 3/mm (ref 1.0–3.6)
MCH: 28.5 pg (ref 26.0–34.0)
MCHC: 33.1 g/dL (ref 32.0–36.0)
MCV: 86 fL (ref 80–100)
Monocyte #: 0.6 x10 3/mm (ref 0.2–0.9)
Monocyte %: 14.9 %
NEUTROS PCT: 45.6 %
Neutrophil #: 1.8 x10 3/mm (ref 1.4–6.5)
PLATELETS: 178 x10 3/mm (ref 150–440)
RBC: 4.96 10*6/uL (ref 3.80–5.20)
RDW: 14.9 % — AB (ref 11.5–14.5)
WBC: 4 x10 3/mm (ref 3.6–11.0)

## 2013-10-04 LAB — CBC CANCER CENTER
Basophil #: 0 x10 3/mm (ref 0.0–0.1)
Basophil %: 0.7 %
EOS ABS: 0.1 x10 3/mm (ref 0.0–0.7)
Eosinophil %: 3 %
HCT: 41.1 % (ref 35.0–47.0)
HGB: 14.1 g/dL (ref 12.0–16.0)
Lymphocyte #: 1.1 x10 3/mm (ref 1.0–3.6)
Lymphocyte %: 32.5 %
MCH: 28.8 pg (ref 26.0–34.0)
MCHC: 34.3 g/dL (ref 32.0–36.0)
MCV: 84 fL (ref 80–100)
Monocyte #: 0.6 x10 3/mm (ref 0.2–0.9)
Monocyte %: 18.3 %
Neutrophil #: 1.5 x10 3/mm (ref 1.4–6.5)
Neutrophil %: 45.5 %
Platelet: 177 x10 3/mm (ref 150–440)
RBC: 4.89 10*6/uL (ref 3.80–5.20)
RDW: 14.6 % — AB (ref 11.5–14.5)
WBC: 3.3 x10 3/mm — AB (ref 3.6–11.0)

## 2013-10-09 ENCOUNTER — Ambulatory Visit: Payer: BC Managed Care – PPO | Admitting: General Surgery

## 2013-10-10 ENCOUNTER — Ambulatory Visit: Payer: Self-pay | Admitting: Hematology and Oncology

## 2013-10-11 LAB — CBC CANCER CENTER
BASOS ABS: 0 x10 3/mm (ref 0.0–0.1)
BASOS PCT: 0.9 %
EOS PCT: 3.1 %
Eosinophil #: 0.1 x10 3/mm (ref 0.0–0.7)
HCT: 44.2 % (ref 35.0–47.0)
HGB: 14.6 g/dL (ref 12.0–16.0)
LYMPHS PCT: 30.7 %
Lymphocyte #: 0.9 x10 3/mm — ABNORMAL LOW (ref 1.0–3.6)
MCH: 28.5 pg (ref 26.0–34.0)
MCHC: 33.1 g/dL (ref 32.0–36.0)
MCV: 86 fL (ref 80–100)
MONO ABS: 0.5 x10 3/mm (ref 0.2–0.9)
MONOS PCT: 15.4 %
Neutrophil #: 1.5 x10 3/mm (ref 1.4–6.5)
Neutrophil %: 49.9 %
PLATELETS: 180 x10 3/mm (ref 150–440)
RBC: 5.12 10*6/uL (ref 3.80–5.20)
RDW: 14.9 % — AB (ref 11.5–14.5)
WBC: 3.1 x10 3/mm — ABNORMAL LOW (ref 3.6–11.0)

## 2013-10-18 LAB — COMPREHENSIVE METABOLIC PANEL
ALBUMIN: 3.8 g/dL (ref 3.4–5.0)
AST: 15 U/L (ref 15–37)
Alkaline Phosphatase: 92 U/L
Anion Gap: 11 (ref 7–16)
BUN: 15 mg/dL (ref 7–18)
Bilirubin,Total: 0.3 mg/dL (ref 0.2–1.0)
CALCIUM: 9.6 mg/dL (ref 8.5–10.1)
CREATININE: 1.11 mg/dL (ref 0.60–1.30)
Chloride: 104 mmol/L (ref 98–107)
Co2: 28 mmol/L (ref 21–32)
EGFR (African American): >60
EGFR (Non-African Amer.): 54 — ABNORMAL LOW
Glucose: 119 mg/dL — ABNORMAL HIGH (ref 65–99)
Osmolality: 287 (ref 275–301)
Potassium: 3.7 mmol/L (ref 3.5–5.1)
SGPT (ALT): 21 U/L (ref 12–78)
SODIUM: 143 mmol/L (ref 136–145)
Total Protein: 8 g/dL (ref 6.4–8.2)

## 2013-10-18 LAB — CBC CANCER CENTER
Basophil #: 0 x10 3/mm (ref 0.0–0.1)
Basophil %: 0.5 %
EOS ABS: 0.1 x10 3/mm (ref 0.0–0.7)
Eosinophil %: 3.4 %
HCT: 44.6 % (ref 35.0–47.0)
HGB: 14.6 g/dL (ref 12.0–16.0)
Lymphocyte #: 0.7 x10 3/mm — ABNORMAL LOW (ref 1.0–3.6)
Lymphocyte %: 23.9 %
MCH: 28.6 pg (ref 26.0–34.0)
MCHC: 32.8 g/dL (ref 32.0–36.0)
MCV: 87 fL (ref 80–100)
Monocyte #: 0.4 x10 3/mm (ref 0.2–0.9)
Monocyte %: 14 %
NEUTROS PCT: 58.2 %
Neutrophil #: 1.7 x10 3/mm (ref 1.4–6.5)
Platelet: 168 x10 3/mm (ref 150–440)
RBC: 5.11 10*6/uL (ref 3.80–5.20)
RDW: 14.7 % — ABNORMAL HIGH (ref 11.5–14.5)
WBC: 2.9 x10 3/mm — ABNORMAL LOW (ref 3.6–11.0)

## 2013-10-19 LAB — CANCER ANTIGEN 27.29: CA 27.29: 56.3 U/mL — AB (ref 0.0–38.6)

## 2013-10-25 LAB — CBC CANCER CENTER
BASOS ABS: 0 x10 3/mm (ref 0.0–0.1)
BASOS PCT: 0.4 %
EOS ABS: 0.1 x10 3/mm (ref 0.0–0.7)
Eosinophil %: 2.6 %
HCT: 42.3 % (ref 35.0–47.0)
HGB: 14.1 g/dL (ref 12.0–16.0)
LYMPHS PCT: 23.3 %
Lymphocyte #: 0.8 x10 3/mm — ABNORMAL LOW (ref 1.0–3.6)
MCH: 29 pg (ref 26.0–34.0)
MCHC: 33.3 g/dL (ref 32.0–36.0)
MCV: 87 fL (ref 80–100)
Monocyte #: 0.7 x10 3/mm (ref 0.2–0.9)
Monocyte %: 22 %
Neutrophil #: 1.7 x10 3/mm (ref 1.4–6.5)
Neutrophil %: 51.7 %
PLATELETS: 171 x10 3/mm (ref 150–440)
RBC: 4.87 10*6/uL (ref 3.80–5.20)
RDW: 14.9 % — ABNORMAL HIGH (ref 11.5–14.5)
WBC: 3.3 x10 3/mm — ABNORMAL LOW (ref 3.6–11.0)

## 2013-11-10 ENCOUNTER — Ambulatory Visit: Payer: Self-pay | Admitting: Hematology and Oncology

## 2013-11-15 ENCOUNTER — Ambulatory Visit: Payer: BC Managed Care – PPO | Admitting: General Surgery

## 2013-11-19 ENCOUNTER — Ambulatory Visit (INDEPENDENT_AMBULATORY_CARE_PROVIDER_SITE_OTHER): Payer: BC Managed Care – PPO | Admitting: General Surgery

## 2013-11-19 ENCOUNTER — Encounter: Payer: Self-pay | Admitting: General Surgery

## 2013-11-19 VITALS — BP 130/80 | HR 76 | Resp 12 | Ht 67.0 in | Wt 179.0 lb

## 2013-11-19 DIAGNOSIS — C50912 Malignant neoplasm of unspecified site of left female breast: Secondary | ICD-10-CM

## 2013-11-19 DIAGNOSIS — C50919 Malignant neoplasm of unspecified site of unspecified female breast: Secondary | ICD-10-CM

## 2013-11-19 NOTE — Patient Instructions (Signed)
Patient to return in 2 months left breast punch biopsy's .

## 2013-11-19 NOTE — Progress Notes (Signed)
Patient ID: [name] [name], female   DOB: 03/06/1951, 63 y.o.   MRN: [id number]  Chief Complaint  Patient presents with  . Follow-up    discuss surgery    HPI [name] [name] is a 63 y.o. female here today to discuss surgery . Patient states she is been going well. Patient finished her radiation 2 weeks ago. She states she feels numb in the fourth and firth fingers on the left hand.  HPI  Past Medical History  Diagnosis Date  . Personal history of malignant neoplasm of breast     Past Surgical History  Procedure Laterality Date  . Oophrectomy  1980  . Abdominal hysterectomy  1995  . Breast surgery Right 1996    mastectomy  . Breast biopsy Left 2014    Family History  Problem Relation Age of Onset  . Cancer Father   . Heart disease Sister   . Diabetes Sister   . Heart disease Brother   . Cancer Paternal Grandmother     breast    Social History History  Substance Use Topics  . Smoking status: Current Every Day Smoker -- 1.00 packs/day for 25 years    Types: Cigarettes  . Smokeless tobacco: Never Used  . Alcohol Use: No    Allergies  Allergen Reactions  . Sulfa Antibiotics Nausea And Vomiting    headache    Current Outpatient Prescriptions  Medication Sig Dispense Refill  . letrozole (FEMARA) 2.5 MG tablet Take 2.5 mg by mouth daily.      Marland Kitchen LORazepam (ATIVAN) 1 MG tablet        No current facility-administered medications for this visit.    Review of Systems Review of Systems  Blood pressure 130/80, pulse 76, resp. rate 12, height 5\' 7"  (1.702 m), weight 179 lb (81.194 kg).  Physical Exam Physical Exam  Constitutional: She is oriented to person, place, and time. She appears well-developed and well-nourished.  Eyes: Conjunctivae are normal. No scleral icterus.  Neck: Neck supple.  Cardiovascular: Normal rate, regular rhythm and normal heart sounds.   Pulmonary/Chest:    Left breast site there is extremist changes from prevent visit. Lower left  breast  4 by 9 cm area of healing tissues  Lymphadenopathy:    She has no axillary adenopathy.       Left: No supraclavicular adenopathy present.  Neurological: She is alert and oriented to person, place, and time.  Skin: Skin is warm and dry.  Examination of the upper extremities for lymphedema was completed. Measurements were obtained 15 cm superior as well as 10 and 20 cm inferior to the olecranon process.   Measurements on the right: 32, 28.5 and 22 cm respectively. (June 2014 measurements: 34, 29, 22 centimeters respectively.)  Measurements on the left: 33, 27.5 and 22 respectively. (June 2014 measurements: 33, 29, 22 cm respectively)  Data Reviewed PET scan dated May 2015 showed increasing areas of hypermetabolic activity in the lower aspect left breast. No evidence of axillary disease or ongoing activity and the previously identified left neck.  Medical oncology notes of October 23, 2013 reviewed.  The patient recently completed chest wall radiation approximately 10 days ago.  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     Plan    The patient was seen in conjunction with Dr. Jamal Collin, for his input regarding management of the sizable area of active disease.  Will allow 2 months for the swelling related to the recent radiation to  resolve and then re-biopsy the periphery of the chest wall to see if it's possible to address resection of all the nodal cutaneous disease as well as the rest itself. If this is not possible, we'll need to consider whether debulking the breast primary and leaving microscopic disease in the skin post radiation will be of benefit to the patient.  The possibility of plastic surgery involvement for rotation flap coverage was broached.  We'll arrange for plastic surgery consultation in about 2 months, just after her next set of biopsies are completed for early input regarding our coverage.    PCP: No Pcp Per Patient       [name] [name] 11/19/2013, 9:52 AM

## 2013-11-21 ENCOUNTER — Telehealth: Payer: Self-pay | Admitting: *Deleted

## 2013-11-21 NOTE — Telephone Encounter (Signed)
Records have been forwarded to Dr. Edwin Dada office requesting an appointment. April at Dr. Edwin Dada office confirmed that they received records.   Dr. Edwin Dada office will be in contact with patient to arrange day and time.

## 2013-11-21 NOTE — Telephone Encounter (Signed)
Message copied by Dominga Ferry on Wed Nov 21, 2013 11:50 AM ------      Message from: [place], Forest Gleason      Created: Mon Nov 19, 2013  9:06 PM       Please arrange for appointment with Nicholaus Bloom, MD about 2 weeks after her October visit to discuss the chest wall reconstruction. Thank you ------

## 2013-12-06 LAB — CBC CANCER CENTER
BASOS ABS: 0 x10 3/mm (ref 0.0–0.1)
Basophil %: 0.5 %
Eosinophil #: 0.1 x10 3/mm (ref 0.0–0.7)
Eosinophil %: 2.4 %
HCT: 42.7 % (ref 35.0–47.0)
HGB: 14 g/dL (ref 12.0–16.0)
LYMPHS PCT: 35.9 %
Lymphocyte #: 1.4 x10 3/mm (ref 1.0–3.6)
MCH: 28.7 pg (ref 26.0–34.0)
MCHC: 32.8 g/dL (ref 32.0–36.0)
MCV: 88 fL (ref 80–100)
MONOS PCT: 15.2 %
Monocyte #: 0.6 x10 3/mm (ref 0.2–0.9)
NEUTROS ABS: 1.8 x10 3/mm (ref 1.4–6.5)
Neutrophil %: 46 %
Platelet: 187 x10 3/mm (ref 150–440)
RBC: 4.88 10*6/uL (ref 3.80–5.20)
RDW: 14.4 % (ref 11.5–14.5)
WBC: 4 x10 3/mm (ref 3.6–11.0)

## 2013-12-11 ENCOUNTER — Ambulatory Visit: Payer: Self-pay | Admitting: Hematology and Oncology

## 2014-01-01 ENCOUNTER — Ambulatory Visit: Payer: Self-pay | Admitting: Hematology and Oncology

## 2014-01-08 LAB — CBC CANCER CENTER
BASOS PCT: 0.5 %
Basophil #: 0 x10 3/mm (ref 0.0–0.1)
EOS ABS: 0.1 x10 3/mm (ref 0.0–0.7)
EOS PCT: 2.4 %
HCT: 42.2 % (ref 35.0–47.0)
HGB: 13.9 g/dL (ref 12.0–16.0)
LYMPHS ABS: 1.1 x10 3/mm (ref 1.0–3.6)
LYMPHS PCT: 31 %
MCH: 28.7 pg (ref 26.0–34.0)
MCHC: 32.9 g/dL (ref 32.0–36.0)
MCV: 87 fL (ref 80–100)
MONOS PCT: 15.7 %
Monocyte #: 0.6 x10 3/mm (ref 0.2–0.9)
Neutrophil #: 1.8 x10 3/mm (ref 1.4–6.5)
Neutrophil %: 50.4 %
PLATELETS: 188 x10 3/mm (ref 150–440)
RBC: 4.83 10*6/uL (ref 3.80–5.20)
RDW: 14.5 % (ref 11.5–14.5)
WBC: 3.6 x10 3/mm (ref 3.6–11.0)

## 2014-01-08 LAB — COMPREHENSIVE METABOLIC PANEL
ALBUMIN: 3.6 g/dL (ref 3.4–5.0)
ALK PHOS: 83 U/L
Anion Gap: 7 (ref 7–16)
BUN: 16 mg/dL (ref 7–18)
Bilirubin,Total: 0.3 mg/dL (ref 0.2–1.0)
Calcium, Total: 9.4 mg/dL (ref 8.5–10.1)
Chloride: 105 mmol/L (ref 98–107)
Co2: 30 mmol/L (ref 21–32)
Creatinine: 1.04 mg/dL (ref 0.60–1.30)
EGFR (African American): >60
GFR CALC NON AF AMER: 57 — AB
Glucose: 91 mg/dL (ref 65–99)
Osmolality: 284 (ref 275–301)
POTASSIUM: 3.9 mmol/L (ref 3.5–5.1)
SGOT(AST): 15 U/L (ref 15–37)
SGPT (ALT): 22 U/L
SODIUM: 142 mmol/L (ref 136–145)
TOTAL PROTEIN: 7.5 g/dL (ref 6.4–8.2)

## 2014-01-10 ENCOUNTER — Ambulatory Visit: Payer: Self-pay | Admitting: Internal Medicine

## 2014-01-10 ENCOUNTER — Ambulatory Visit: Payer: Self-pay | Admitting: Hematology and Oncology

## 2014-01-11 LAB — CANCER ANTIGEN 27.29: CA 27.29: 22.9 U/mL (ref 0.0–38.6)

## 2014-01-21 ENCOUNTER — Ambulatory Visit (INDEPENDENT_AMBULATORY_CARE_PROVIDER_SITE_OTHER): Payer: BC Managed Care – PPO | Admitting: General Surgery

## 2014-01-21 ENCOUNTER — Encounter: Payer: Self-pay | Admitting: General Surgery

## 2014-01-21 VITALS — BP 120/78 | HR 76 | Resp 12 | Ht 67.5 in | Wt 182.0 lb

## 2014-01-21 DIAGNOSIS — C50912 Malignant neoplasm of unspecified site of left female breast: Secondary | ICD-10-CM

## 2014-01-21 HISTORY — PX: BREAST BIOPSY: SHX20

## 2014-01-21 NOTE — Progress Notes (Signed)
Patient ID: [name] [name], female   DOB: 22-Oct-1950, 63 y.o.   MRN: [id number]  Chief Complaint  Patient presents with  . Procedure    left breast punch biopsy x 2    HPI [name] [name] is a 63 y.o. female who presents for a procedure. The procedure to be performed in a left breast punch biopsy x 4 Quadrants.   HPI  Past Medical History  Diagnosis Date  . Personal history of malignant neoplasm of breast     Past Surgical History  Procedure Laterality Date  . Oophrectomy  1980  . Abdominal hysterectomy  1995  . Breast surgery Right 1996    mastectomy  . Breast biopsy Left 2014    Family History  Problem Relation Age of Onset  . Cancer Father   . Heart disease Sister   . Diabetes Sister   . Heart disease Brother   . Cancer Paternal Grandmother     breast    Social History History  Substance Use Topics  . Smoking status: Current Every Day Smoker -- 1.00 packs/day for 25 years    Types: Cigarettes  . Smokeless tobacco: Never Used  . Alcohol Use: No    Allergies  Allergen Reactions  . Sulfa Antibiotics Nausea And Vomiting    headache    Current Outpatient Prescriptions  Medication Sig Dispense Refill  . Calcium Carbonate-Vitamin D (CALCIUM + D PO) Take 2 tablets by mouth daily.      Marland Kitchen letrozole (FEMARA) 2.5 MG tablet Take 2.5 mg by mouth daily.      Marland Kitchen VITAMIN E PO Take 1 tablet by mouth daily.       No current facility-administered medications for this visit.    Review of Systems Review of Systems  Constitutional: Negative.   Respiratory: Negative.   Cardiovascular: Negative.     Blood pressure 120/78, pulse 76, resp. rate 12, height 5' 7.5" (1.715 m), weight 182 lb (82.555 kg).  Physical Exam Physical Exam  Pulmonary/Chest:      Data Reviewed January 01, 2014 PET scan suggests a new area of nodularity in the medial aspect of the breast as well as a pleural based area atypical  metastatic disease.  Assessment    Advanced left breast  cancer.     Plan    4 quadrant biopsy and recommended to reassess for the possibility of resection she's been on a aromatase inhibitor for a year and had chest wall radiation. Previous biopsies completed one foot apar had shown malignancy on her last assessment.  The ureter was cleansed with alcohol a total 10 cc of 0.5% Xylocaine 0.25% Marcaine with 1-200,000 his epinephrine was utilized a well-tolerated. Chlorpromazine plug of the skin. 2.5 mm punch biopsy obtained from 4 quadrants of the breast 12, 3, 6 and 9 o'clock position. Each defect closed with a single nylon stitch. Procedure was well tolerated. Benzoin and Steri-Strips applied. The patient will be contacted when the pathology is available. If there is a significant reduction in dermal involvement she may be a candidate for rotation flap coverage and vasectomy.     PCP: No Pcp Per Patient   Robert Bellow 01/22/2014, 8:52 PM

## 2014-01-21 NOTE — Patient Instructions (Signed)
Patient to return in 1 week for follow up. The patient is aware to call back for any questions or concerns.

## 2014-01-22 DIAGNOSIS — C50919 Malignant neoplasm of unspecified site of unspecified female breast: Secondary | ICD-10-CM | POA: Insufficient documentation

## 2014-01-24 LAB — PATHOLOGY

## 2014-01-29 ENCOUNTER — Encounter: Payer: Self-pay | Admitting: General Surgery

## 2014-01-29 ENCOUNTER — Ambulatory Visit (INDEPENDENT_AMBULATORY_CARE_PROVIDER_SITE_OTHER): Payer: BC Managed Care – PPO | Admitting: General Surgery

## 2014-01-29 VITALS — BP 130/84 | HR 84 | Resp 14 | Ht 67.5 in | Wt 181.0 lb

## 2014-01-29 DIAGNOSIS — C792 Secondary malignant neoplasm of skin: Secondary | ICD-10-CM

## 2014-01-29 DIAGNOSIS — C50912 Malignant neoplasm of unspecified site of left female breast: Secondary | ICD-10-CM

## 2014-01-29 NOTE — Progress Notes (Signed)
Patient ID: [name] [name], female   DOB: 04-26-1950, 63 y.o.   MRN: [id number]  Chief Complaint  Patient presents with  . Routine Post Op    HPI [name] [name] is a 63 y.o. female.  Here today for postoperative visit, left breast biopsy on 01-21-14. States she is doing well. She is recovering from "flu" like symptoms that started Tuesday last week.  The patient tolerated her biopsies without difficulty.   HPI  Past Medical History  Diagnosis Date  . Personal history of malignant neoplasm of breast     Past Surgical History  Procedure Laterality Date  . Oophrectomy  1980  . Abdominal hysterectomy  1995  . Breast surgery Right 1996    mastectomy  . Breast biopsy Left 2014  . Breast biopsy Left 01-21-14    Family History  Problem Relation Age of Onset  . Cancer Father   . Heart disease Sister   . Diabetes Sister   . Heart disease Brother   . Cancer Paternal Grandmother     breast    Social History History  Substance Use Topics  . Smoking status: Current Every Day Smoker -- 1.00 packs/day for 25 years    Types: Cigarettes  . Smokeless tobacco: Never Used  . Alcohol Use: No    Allergies  Allergen Reactions  . Sulfa Antibiotics Nausea And Vomiting    headache    Current Outpatient Prescriptions  Medication Sig Dispense Refill  . Calcium Carbonate-Vitamin D (CALCIUM + D PO) Take 2 tablets by mouth daily.      Marland Kitchen letrozole (FEMARA) 2.5 MG tablet Take 2.5 mg by mouth daily.      Marland Kitchen VITAMIN E PO Take 1 tablet by mouth daily.       No current facility-administered medications for this visit.    Review of Systems Review of Systems  Constitutional: Negative.   Respiratory: Negative.   Cardiovascular: Negative.     Blood pressure 130/84, pulse 84, resp. rate 14, height 5' 7.5" (1.715 m), weight 181 lb (82.101 kg).  Physical Exam Physical Exam  Constitutional: She is oriented to person, place, and time. She appears well-developed and well-nourished.   Pulmonary/Chest:  Left breast punch biopsy incisions healing well, sutures removed.  Neurological: She is alert and oriented to person, place, and time.  Skin: Skin is warm and dry.    Data Reviewed All of the biopsy samples showed evidence of residual carcinoma.  Assessment    Diffuse skin involvement with invasive lobular carcinoma. No evidence of metastatic disease on PET scan.     Plan    The case has been discussed with both medical oncology and plastic surgery (Dr.Coan). The patient may be able to undergo up she resection of the involved skin with latissimus and free flap coverage, but he recommended university assessment.  With no other areas of metastatic disease, if clear margins could be obtained this might be a significant benefit to the patient. If clear margins can't be predicted or obtained, medical oncology does not believe that debulking would be of benefit.  The patient is amenable to assessment at [facility].     Patient is scheduled to see [facility] Dr Venancio Poisson on 03/27/14 at 1:30 pm. Patient is aware of date and time.   PCP none Ref: [facility] 01/29/2014, 10:00 PM

## 2014-01-29 NOTE — Patient Instructions (Signed)
Continue self breast exams. Call office for any new breast issues or concerns. 

## 2014-01-31 ENCOUNTER — Ambulatory Visit: Payer: BC Managed Care – PPO | Admitting: General Surgery

## 2014-02-05 LAB — CBC CANCER CENTER
Basophil #: 0 x10 3/mm (ref 0.0–0.1)
Basophil %: 0.6 %
EOS ABS: 0.1 x10 3/mm (ref 0.0–0.7)
EOS PCT: 2.8 %
HCT: 42.5 % (ref 35.0–47.0)
HGB: 13.8 g/dL (ref 12.0–16.0)
Lymphocyte #: 1.3 x10 3/mm (ref 1.0–3.6)
Lymphocyte %: 26.5 %
MCH: 28.9 pg (ref 26.0–34.0)
MCHC: 32.5 g/dL (ref 32.0–36.0)
MCV: 89 fL (ref 80–100)
Monocyte #: 0.5 x10 3/mm (ref 0.2–0.9)
Monocyte %: 11.4 %
Neutrophil #: 2.8 x10 3/mm (ref 1.4–6.5)
Neutrophil %: 58.7 %
Platelet: 208 x10 3/mm (ref 150–440)
RBC: 4.78 10*6/uL (ref 3.80–5.20)
RDW: 14.4 % (ref 11.5–14.5)
WBC: 4.8 x10 3/mm (ref 3.6–11.0)

## 2014-02-05 LAB — BASIC METABOLIC PANEL
Anion Gap: 8 (ref 7–16)
BUN: 16 mg/dL (ref 7–18)
CALCIUM: 9.1 mg/dL (ref 8.5–10.1)
CO2: 27 mmol/L (ref 21–32)
CREATININE: 1.08 mg/dL (ref 0.60–1.30)
Chloride: 108 mmol/L — ABNORMAL HIGH (ref 98–107)
EGFR (African American): >60
GFR CALC NON AF AMER: 55 — AB
GLUCOSE: 111 mg/dL — AB (ref 65–99)
Osmolality: 287 (ref 275–301)
Potassium: 3.8 mmol/L (ref 3.5–5.1)
Sodium: 143 mmol/L (ref 136–145)

## 2014-02-10 ENCOUNTER — Ambulatory Visit: Payer: Self-pay | Admitting: Hematology and Oncology

## 2014-02-10 ENCOUNTER — Ambulatory Visit: Payer: Self-pay | Admitting: Internal Medicine

## 2014-02-11 ENCOUNTER — Encounter: Payer: Self-pay | Admitting: General Surgery

## 2014-02-19 LAB — CBC CANCER CENTER
BASOS ABS: 0 x10 3/mm (ref 0.0–0.1)
Basophil %: 0.5 %
EOS ABS: 0.1 x10 3/mm (ref 0.0–0.7)
Eosinophil %: 2.2 %
HCT: 42.2 % (ref 35.0–47.0)
HGB: 13.9 g/dL (ref 12.0–16.0)
LYMPHS PCT: 26 %
Lymphocyte #: 0.8 x10 3/mm — ABNORMAL LOW (ref 1.0–3.6)
MCH: 29.4 pg (ref 26.0–34.0)
MCHC: 32.8 g/dL (ref 32.0–36.0)
MCV: 90 fL (ref 80–100)
MONO ABS: 0.3 x10 3/mm (ref 0.2–0.9)
Monocyte %: 8.3 %
Neutrophil #: 2 x10 3/mm (ref 1.4–6.5)
Neutrophil %: 63 %
PLATELETS: 202 x10 3/mm (ref 150–440)
RBC: 4.72 10*6/uL (ref 3.80–5.20)
RDW: 14.4 % (ref 11.5–14.5)
WBC: 3.2 x10 3/mm — AB (ref 3.6–11.0)

## 2014-03-05 LAB — CBC CANCER CENTER
BASOS PCT: 0.9 %
Basophil #: 0 x10 3/mm (ref 0.0–0.1)
EOS ABS: 0 x10 3/mm (ref 0.0–0.7)
Eosinophil %: 0.9 %
HCT: 40.8 % (ref 35.0–47.0)
HGB: 13.4 g/dL (ref 12.0–16.0)
Lymphocyte #: 1.2 x10 3/mm (ref 1.0–3.6)
Lymphocyte %: 50.2 %
MCH: 29.3 pg (ref 26.0–34.0)
MCHC: 32.9 g/dL (ref 32.0–36.0)
MCV: 89 fL (ref 80–100)
MONOS PCT: 5.8 %
Monocyte #: 0.1 x10 3/mm — ABNORMAL LOW (ref 0.2–0.9)
NEUTROS PCT: 42.2 %
Neutrophil #: 1 x10 3/mm — ABNORMAL LOW (ref 1.4–6.5)
PLATELETS: 138 x10 3/mm — AB (ref 150–440)
RBC: 4.58 10*6/uL (ref 3.80–5.20)
RDW: 14.5 % (ref 11.5–14.5)
WBC: 2.4 x10 3/mm — AB (ref 3.6–11.0)

## 2014-03-05 LAB — COMPREHENSIVE METABOLIC PANEL
ALT: 47 U/L
ANION GAP: 8 (ref 7–16)
Albumin: 3.7 g/dL (ref 3.4–5.0)
Alkaline Phosphatase: 99 U/L
BILIRUBIN TOTAL: 0.4 mg/dL (ref 0.2–1.0)
BUN: 15 mg/dL (ref 7–18)
CALCIUM: 9.4 mg/dL (ref 8.5–10.1)
CHLORIDE: 104 mmol/L (ref 98–107)
Co2: 27 mmol/L (ref 21–32)
Creatinine: 1.16 mg/dL (ref 0.60–1.30)
EGFR (Non-African Amer.): 51 — ABNORMAL LOW
Glucose: 101 mg/dL — ABNORMAL HIGH (ref 65–99)
Osmolality: 279 (ref 275–301)
Potassium: 3.6 mmol/L (ref 3.5–5.1)
SGOT(AST): 20 U/L (ref 15–37)
SODIUM: 139 mmol/L (ref 136–145)
Total Protein: 7.7 g/dL (ref 6.4–8.2)

## 2014-03-06 LAB — CANCER ANTIGEN 27.29: CA 27.29: 54.1 U/mL — ABNORMAL HIGH (ref 0.0–38.6)

## 2014-03-12 ENCOUNTER — Ambulatory Visit: Payer: Self-pay | Admitting: Internal Medicine

## 2014-03-12 ENCOUNTER — Ambulatory Visit: Payer: Self-pay | Admitting: Hematology and Oncology

## 2014-03-19 LAB — CBC CANCER CENTER
Basophil #: 0 x10 3/mm (ref 0.0–0.1)
Basophil %: 1.2 %
EOS ABS: 0 x10 3/mm (ref 0.0–0.7)
EOS PCT: 1 %
HCT: 43.5 % (ref 35.0–47.0)
HGB: 14.3 g/dL (ref 12.0–16.0)
Lymphocyte #: 1.2 x10 3/mm (ref 1.0–3.6)
Lymphocyte %: 36.8 %
MCH: 29.1 pg (ref 26.0–34.0)
MCHC: 32.8 g/dL (ref 32.0–36.0)
MCV: 89 fL (ref 80–100)
Monocyte #: 0.6 x10 3/mm (ref 0.2–0.9)
Monocyte %: 17.6 %
Neutrophil #: 1.4 x10 3/mm (ref 1.4–6.5)
Neutrophil %: 43.4 %
Platelet: 182 x10 3/mm (ref 150–440)
RBC: 4.89 10*6/uL (ref 3.80–5.20)
RDW: 16.3 % — AB (ref 11.5–14.5)
WBC: 3.2 x10 3/mm — ABNORMAL LOW (ref 3.6–11.0)

## 2014-03-19 LAB — COMPREHENSIVE METABOLIC PANEL
ALK PHOS: 90 U/L
Albumin: 3.8 g/dL (ref 3.4–5.0)
Anion Gap: 12 (ref 7–16)
BUN: 18 mg/dL (ref 7–18)
Bilirubin,Total: 0.4 mg/dL (ref 0.2–1.0)
CALCIUM: 9.6 mg/dL (ref 8.5–10.1)
Chloride: 105 mmol/L (ref 98–107)
Co2: 25 mmol/L (ref 21–32)
Creatinine: 1.17 mg/dL (ref 0.60–1.30)
EGFR (African American): >60
GFR CALC NON AF AMER: 50 — AB
GLUCOSE: 106 mg/dL — AB (ref 65–99)
Osmolality: 285 (ref 275–301)
Potassium: 4.4 mmol/L (ref 3.5–5.1)
SGOT(AST): 19 U/L (ref 15–37)
SGPT (ALT): 22 U/L
Sodium: 142 mmol/L (ref 136–145)
Total Protein: 7.9 g/dL (ref 6.4–8.2)

## 2014-04-10 LAB — COMPREHENSIVE METABOLIC PANEL
ANION GAP: 14 (ref 7–16)
Albumin: 3.7 g/dL (ref 3.4–5.0)
Alkaline Phosphatase: 81 U/L
BUN: 15 mg/dL (ref 7–18)
Bilirubin,Total: 0.4 mg/dL (ref 0.2–1.0)
CREATININE: 1.2 mg/dL (ref 0.60–1.30)
Calcium, Total: 9 mg/dL (ref 8.5–10.1)
Chloride: 104 mmol/L (ref 98–107)
Co2: 25 mmol/L (ref 21–32)
EGFR (Non-African Amer.): 49 — ABNORMAL LOW
GFR CALC AF AMER: 59 — AB
Glucose: 96 mg/dL (ref 65–99)
Osmolality: 286 (ref 275–301)
POTASSIUM: 3.8 mmol/L (ref 3.5–5.1)
SGOT(AST): 17 U/L (ref 15–37)
SGPT (ALT): 30 U/L
SODIUM: 143 mmol/L (ref 136–145)
TOTAL PROTEIN: 7.7 g/dL (ref 6.4–8.2)

## 2014-04-10 LAB — CBC CANCER CENTER
BASOS ABS: 0 x10 3/mm (ref 0.0–0.1)
Basophil %: 0.6 %
EOS ABS: 0.1 x10 3/mm (ref 0.0–0.7)
Eosinophil %: 1.9 %
HCT: 40.1 % (ref 35.0–47.0)
HGB: 13.4 g/dL (ref 12.0–16.0)
LYMPHS ABS: 0.8 x10 3/mm — AB (ref 1.0–3.6)
Lymphocyte %: 26.3 %
MCH: 29.5 pg (ref 26.0–34.0)
MCHC: 33.5 g/dL (ref 32.0–36.0)
MCV: 88 fL (ref 80–100)
Monocyte #: 0.2 x10 3/mm (ref 0.2–0.9)
Monocyte %: 8.4 %
Neutrophil #: 1.9 x10 3/mm (ref 1.4–6.5)
Neutrophil %: 62.8 %
PLATELETS: 183 x10 3/mm (ref 150–440)
RBC: 4.55 10*6/uL (ref 3.80–5.20)
RDW: 16.4 % — ABNORMAL HIGH (ref 11.5–14.5)
WBC: 2.9 x10 3/mm — ABNORMAL LOW (ref 3.6–11.0)

## 2014-04-12 ENCOUNTER — Ambulatory Visit: Payer: Self-pay | Admitting: Hematology and Oncology

## 2014-04-12 ENCOUNTER — Ambulatory Visit: Payer: Self-pay | Admitting: Internal Medicine

## 2014-04-12 LAB — CANCER ANTIGEN 27.29: CA 27.29: 94 U/mL — ABNORMAL HIGH (ref 0.0–38.6)

## 2014-05-08 ENCOUNTER — Telehealth: Payer: Self-pay | Admitting: *Deleted

## 2014-05-08 NOTE — Telephone Encounter (Signed)
I talked with Dr. Dahlia Bailiff office and the patient had to reschedule her appointment from 03-27-14 to 05-15-14. They are aware to fax [place] notes after the appointment.

## 2014-05-09 ENCOUNTER — Ambulatory Visit: Payer: Self-pay | Admitting: Hematology and Oncology

## 2014-05-09 LAB — CBC CANCER CENTER
BASOS ABS: 0.1 x10 3/mm (ref 0.0–0.1)
Basophil %: 1.2 %
EOS PCT: 2.8 %
Eosinophil #: 0.1 x10 3/mm (ref 0.0–0.7)
HCT: 40.4 % (ref 35.0–47.0)
HGB: 13.7 g/dL (ref 12.0–16.0)
Lymphocyte #: 1.3 x10 3/mm (ref 1.0–3.6)
Lymphocyte %: 29.6 %
MCH: 30.3 pg (ref 26.0–34.0)
MCHC: 33.9 g/dL (ref 32.0–36.0)
MCV: 89 fL (ref 80–100)
MONO ABS: 0.6 x10 3/mm (ref 0.2–0.9)
Monocyte %: 13.2 %
Neutrophil #: 2.3 x10 3/mm (ref 1.4–6.5)
Neutrophil %: 53.2 %
PLATELETS: 226 x10 3/mm (ref 150–440)
RBC: 4.52 10*6/uL (ref 3.80–5.20)
RDW: 18.1 % — AB (ref 11.5–14.5)
WBC: 4.4 x10 3/mm (ref 3.6–11.0)

## 2014-05-09 LAB — COMPREHENSIVE METABOLIC PANEL
ALBUMIN: 3.5 g/dL (ref 3.4–5.0)
AST: 17 U/L (ref 15–37)
Alkaline Phosphatase: 83 U/L (ref 46–116)
Anion Gap: 8 (ref 7–16)
BUN: 17 mg/dL (ref 7–18)
Bilirubin,Total: 0.4 mg/dL (ref 0.2–1.0)
CALCIUM: 8.9 mg/dL (ref 8.5–10.1)
CO2: 29 mmol/L (ref 21–32)
CREATININE: 1.03 mg/dL (ref 0.60–1.30)
Chloride: 105 mmol/L (ref 98–107)
EGFR (African American): >60
GFR CALC NON AF AMER: 58 — AB
Glucose: 94 mg/dL (ref 65–99)
OSMOLALITY: 284 (ref 275–301)
POTASSIUM: 3.5 mmol/L (ref 3.5–5.1)
SGPT (ALT): 24 U/L (ref 14–63)
SODIUM: 142 mmol/L (ref 136–145)
Total Protein: 7.6 g/dL (ref 6.4–8.2)

## 2014-05-10 LAB — CANCER ANTIGEN 27.29: CA 27.29: 170.8 U/mL — ABNORMAL HIGH (ref 0.0–38.6)

## 2014-05-13 ENCOUNTER — Ambulatory Visit: Payer: Self-pay | Admitting: Internal Medicine

## 2014-05-13 ENCOUNTER — Ambulatory Visit: Payer: Self-pay | Admitting: Hematology and Oncology

## 2014-05-23 ENCOUNTER — Telehealth: Payer: Self-pay | Admitting: *Deleted

## 2014-05-23 NOTE — Telephone Encounter (Signed)
-----   Message from Robert Bellow, MD sent at 05/23/2014  3:18 PM EST ----- Please arrange appt for next week: patient needs a core biopsy of the left breast.  Requested by [name] at cancer center.

## 2014-05-23 NOTE — Telephone Encounter (Signed)
Patient contacted today and arrangements have been made for patient to come in on Monday, 05-27-14 at 2 pm.  Message left for Juliann Pulse at the [facility] to fax Dr. Kem Parkinson records.

## 2014-05-27 ENCOUNTER — Ambulatory Visit (INDEPENDENT_AMBULATORY_CARE_PROVIDER_SITE_OTHER): Payer: BLUE CROSS/BLUE SHIELD | Admitting: General Surgery

## 2014-05-27 ENCOUNTER — Ambulatory Visit: Payer: Self-pay

## 2014-05-27 ENCOUNTER — Other Ambulatory Visit: Payer: Self-pay | Admitting: General Surgery

## 2014-05-27 ENCOUNTER — Encounter: Payer: Self-pay | Admitting: General Surgery

## 2014-05-27 VITALS — BP 132/84 | HR 78 | Resp 12 | Ht 67.0 in | Wt 182.0 lb

## 2014-05-27 DIAGNOSIS — C50912 Malignant neoplasm of unspecified site of left female breast: Secondary | ICD-10-CM

## 2014-05-27 NOTE — Patient Instructions (Signed)
The patient is aware to call back for any questions or concerns.  

## 2014-05-27 NOTE — Progress Notes (Signed)
Patient ID: [name] [name], female   DOB: 02/10/1951, 64 y.o.   MRN: [id number]  Chief Complaint  Patient presents with  . Procedure    left breast biopsy    HPI [name] [name] is a 64 y.o. female who presents for a left breast biopsy. The patient was last seen in October which time plans were for plastic surgery assessment at [facility]. The release appointment was December. In the interval the patient showed progressive disease. She is now being followed by [name] [name] M.D. at the cancer center. There is a question of whether her tumor may be HER-2/neu positive and for that reason repeat biopsy of the foci on the of metastatic disease on the chest wall is been requested.   HPI  Past Medical History  Diagnosis Date  . Personal history of malignant neoplasm of breast     Past Surgical History  Procedure Laterality Date  . Oophrectomy  1980  . Abdominal hysterectomy  1995  . Breast surgery Right 1996    mastectomy  . Breast biopsy Left 2014  . Breast biopsy Left 01-21-14    Family History  Problem Relation Age of Onset  . Cancer Father   . Heart disease Sister   . Diabetes Sister   . Heart disease Brother   . Cancer Paternal Grandmother     breast    Social History History  Substance Use Topics  . Smoking status: Current Every Day Smoker -- 1.00 packs/day for 25 years    Types: Cigarettes  . Smokeless tobacco: Never Used  . Alcohol Use: No    Allergies  Allergen Reactions  . Sulfa Antibiotics Nausea And Vomiting    headache    Current Outpatient Prescriptions  Medication Sig Dispense Refill  . Calcium Carbonate-Vitamin D (CALCIUM + D PO) Take 2 tablets by mouth daily.    . capecitabine (XELODA) 150 MG tablet Take 450 mg by mouth 2 (two) times daily after a meal.    . capecitabine (XELODA) 500 MG tablet Take 1,500 mg by mouth 2 (two) times daily after a meal.     No current facility-administered medications for this visit.    Review of Systems Review of  Systems  Constitutional: Negative.   Respiratory: Negative.   Cardiovascular: Negative.     Blood pressure 132/84, pulse 78, resp. rate 12, height 5' 7"  (1.702 m), weight 182 lb (82.555 kg).  Physical Exam Physical Exam  Pulmonary/Chest:      Data:  Case reviewed with Lorenda Cahill, M.D. for medical oncology.   Assessment     Metastatic breast cancer, failed chemotherapy, radiation and antiestrogen treatment.     Plan     Biopsy is been requested to determine if the tumor is now HER-2/neu positive 10 mL of 0.5% Xylocaine with 0.25% Marcaine with 1-200,000 of epinephrine was utilized well tolerated. ChloraPrep was applied to the skin. A 2.5 mm punch biopsy was obtained from the 2 locations noted above and sent in formalin for histology with special attention to HER-2/neu determination.  Wounds were closed with a single 4-0 nylon at each site. Bacitracin ointment followed by Telfa and Tegaderm dressing applied. Postoperative wound care reviewed. She'll return in one week for suture removal by the staff. Robert Bellow 05/27/2014, 12:35 PM

## 2014-05-29 ENCOUNTER — Telehealth: Payer: Self-pay | Admitting: *Deleted

## 2014-05-29 NOTE — Telephone Encounter (Signed)
Calling to confirm that the most recent specimen needed HER2 sent, advised yes.

## 2014-06-03 ENCOUNTER — Ambulatory Visit: Payer: BLUE CROSS/BLUE SHIELD

## 2014-06-03 LAB — HER-2 / NEU, FISH
AVG NUM HER-2 SIGNALS/NUCLEUS: 2
Avg Num CEP17 probes/nucleus:: 2.4
HER-2/CEP17 Ratio: 0.82
NUMBER OF OBSERVERS: 2
Number of Tumor Cells Counted:: 40

## 2014-06-04 ENCOUNTER — Ambulatory Visit: Payer: BLUE CROSS/BLUE SHIELD

## 2014-06-04 LAB — PATHOLOGY

## 2014-06-07 ENCOUNTER — Ambulatory Visit (INDEPENDENT_AMBULATORY_CARE_PROVIDER_SITE_OTHER): Payer: BLUE CROSS/BLUE SHIELD | Admitting: *Deleted

## 2014-06-07 DIAGNOSIS — C50912 Malignant neoplasm of unspecified site of left female breast: Secondary | ICD-10-CM

## 2014-06-07 NOTE — Progress Notes (Signed)
Patient here today for follow up post left chest wall biopsy.  Applied steri strip. The patient is aware that a heating pad may be used for comfort as needed.Follow up as scheduled.

## 2014-06-11 ENCOUNTER — Ambulatory Visit
Admit: 2014-06-11 | Disposition: A | Discharge status: Home / Self Care | Payer: Self-pay | Attending: Hematology and Oncology | Admitting: Hematology and Oncology

## 2014-06-11 ENCOUNTER — Ambulatory Visit
Admit: 2014-06-11 | Disposition: A | Discharge status: Home / Self Care | Payer: Self-pay | Attending: Internal Medicine | Admitting: Internal Medicine

## 2014-07-05 LAB — CANCER ANTIGEN 27.29: CA 27.29: 531.1 U/mL — ABNORMAL HIGH (ref 0.0–38.6)

## 2014-07-11 ENCOUNTER — Encounter
Admit: 2014-07-11 | Disposition: A | Discharge status: Home / Self Care | Payer: Self-pay | Attending: Surgery | Admitting: Surgery

## 2014-07-12 ENCOUNTER — Encounter
Admit: 2014-07-12 | Disposition: A | Discharge status: Home / Self Care | Payer: Self-pay | Attending: Surgery | Admitting: Surgery

## 2014-07-12 ENCOUNTER — Ambulatory Visit
Admit: 2014-07-12 | Disposition: A | Discharge status: Home / Self Care | Payer: Self-pay | Attending: Hematology and Oncology | Admitting: Hematology and Oncology

## 2014-07-12 ENCOUNTER — Ambulatory Visit
Admit: 2014-07-12 | Disposition: A | Discharge status: Home / Self Care | Payer: Self-pay | Attending: Internal Medicine | Admitting: Internal Medicine

## 2014-07-23 LAB — CBC CANCER CENTER
Basophil #: 0 x10 3/mm (ref 0.0–0.1)
Basophil %: 0.3 %
Eosinophil #: 0.1 x10 3/mm (ref 0.0–0.7)
Eosinophil %: 1.6 %
HCT: 40.5 % (ref 35.0–47.0)
HGB: 13.6 g/dL (ref 12.0–16.0)
Lymphocyte #: 1.6 x10 3/mm (ref 1.0–3.6)
Lymphocyte %: 34.1 %
MCH: 32.4 pg (ref 26.0–34.0)
MCHC: 33.6 g/dL (ref 32.0–36.0)
MCV: 97 fL (ref 80–100)
Monocyte #: 0.7 x10 3/mm (ref 0.2–0.9)
Monocyte %: 15.5 %
Neutrophil #: 2.3 x10 3/mm (ref 1.4–6.5)
Neutrophil %: 48.5 %
Platelet: 192 x10 3/mm (ref 150–440)
RBC: 4.2 10*6/uL (ref 3.80–5.20)
RDW: 20 % — ABNORMAL HIGH (ref 11.5–14.5)
WBC: 4.7 x10 3/mm (ref 3.6–11.0)

## 2014-07-23 LAB — COMPREHENSIVE METABOLIC PANEL
Albumin: 4.1 g/dL
Alkaline Phosphatase: 75 U/L
Anion Gap: 8 (ref 7–16)
BUN: 19 mg/dL
Bilirubin,Total: 0.4 mg/dL
Calcium, Total: 9.6 mg/dL
Chloride: 104 mmol/L
Co2: 25 mmol/L
Creatinine: 1.04 mg/dL — ABNORMAL HIGH
EGFR (African American): >60
EGFR (Non-African Amer.): 58 — ABNORMAL LOW
Glucose: 107 mg/dL — ABNORMAL HIGH
Potassium: 4 mmol/L
SGOT(AST): 24 U/L
SGPT (ALT): 22 U/L
Sodium: 137 mmol/L
Total Protein: 7.4 g/dL

## 2014-07-24 LAB — CANCER ANTIGEN 27.29: CA 27.29: 399.7 U/mL — ABNORMAL HIGH (ref 0.0–38.6)

## 2014-07-25 DIAGNOSIS — C50912 Malignant neoplasm of unspecified site of left female breast: Secondary | ICD-10-CM

## 2014-07-25 DIAGNOSIS — Z01818 Encounter for other preprocedural examination: Secondary | ICD-10-CM

## 2014-07-26 LAB — CBC CANCER CENTER
Basophil #: 0 x10 3/mm (ref 0.0–0.1)
Basophil %: 0.7 %
Eosinophil #: 0.1 x10 3/mm (ref 0.0–0.7)
Eosinophil %: 2.5 %
HCT: 40.2 % (ref 35.0–47.0)
HGB: 13.3 g/dL (ref 12.0–16.0)
Lymphocyte #: 1.5 x10 3/mm (ref 1.0–3.6)
Lymphocyte %: 42.2 %
MCH: 31.8 pg (ref 26.0–34.0)
MCHC: 33.1 g/dL (ref 32.0–36.0)
MCV: 96 fL (ref 80–100)
Monocyte #: 0.7 x10 3/mm (ref 0.2–0.9)
Monocyte %: 19.3 %
Neutrophil #: 1.3 x10 3/mm — ABNORMAL LOW (ref 1.4–6.5)
Neutrophil %: 35.3 %
Platelet: 166 x10 3/mm (ref 150–440)
RBC: 4.18 10*6/uL (ref 3.80–5.20)
RDW: 20.4 % — ABNORMAL HIGH (ref 11.5–14.5)
WBC: 3.6 x10 3/mm (ref 3.6–11.0)

## 2014-07-26 LAB — COMPREHENSIVE METABOLIC PANEL
Albumin: 4.1 g/dL
Alkaline Phosphatase: 111 U/L
Anion Gap: 6 — ABNORMAL LOW (ref 7–16)
BUN: 15 mg/dL
Bilirubin,Total: 0.8 mg/dL
Calcium, Total: 8.8 mg/dL — ABNORMAL LOW
Chloride: 98 mmol/L — ABNORMAL LOW
Co2: 28 mmol/L
Creatinine: 0.96 mg/dL
EGFR (African American): >60
EGFR (Non-African Amer.): >60
Glucose: 85 mg/dL
Potassium: 3.9 mmol/L
SGOT(AST): 23 U/L
SGPT (ALT): 22 U/L
Sodium: 132 mmol/L — ABNORMAL LOW
Total Protein: 7.4 g/dL

## 2014-08-01 LAB — CBC CANCER CENTER
Basophil #: 0 x10 3/mm (ref 0.0–0.1)
Basophil %: 0.5 %
Eosinophil #: 0.1 x10 3/mm (ref 0.0–0.7)
Eosinophil %: 1.6 %
HCT: 42.2 % (ref 35.0–47.0)
HGB: 14.2 g/dL (ref 12.0–16.0)
Lymphocyte #: 1.3 x10 3/mm (ref 1.0–3.6)
Lymphocyte %: 32.3 %
MCH: 32.4 pg (ref 26.0–34.0)
MCHC: 33.7 g/dL (ref 32.0–36.0)
MCV: 96 fL (ref 80–100)
Monocyte #: 0.8 x10 3/mm (ref 0.2–0.9)
Monocyte %: 18.3 %
Neutrophil #: 2 x10 3/mm (ref 1.4–6.5)
Neutrophil %: 47.3 %
Platelet: 169 x10 3/mm (ref 150–440)
RBC: 4.38 10*6/uL (ref 3.80–5.20)
RDW: 20 % — ABNORMAL HIGH (ref 11.5–14.5)
WBC: 4.2 x10 3/mm (ref 3.6–11.0)

## 2014-08-02 NOTE — Op Note (Signed)
PATIENT NAME:  [name] [name], [name] [name] MR#:  [id number] DATE OF BIRTH:  May 25, 1950  DATE OF PROCEDURE:  06/05/2012  PREOPERATIVE DIAGNOSIS: Breast cancer with poor venous access.   POSTOPERATIVE DIAGNOSIS: Breast cancer with poor venous access.   PROCEDURES PERFORMED: 1.  Ultrasound guidance for vascular access, right jugular vein.  2.  Fluoroscopic guidance for placement of catheter.  3.  Placement of CT compatible Port-A-Cath, right jugular vein.   SURGEON: Algernon Huxley, M.D.   ANESTHESIA: Local with moderate conscious sedation.   ESTIMATED BLOOD LOSS: Minimal.   FLUOROSCOPY TIME: Less than 1 minute.   CONTRAST USED: None.   INDICATION FOR PROCEDURE: This is a 65 year old African American female with breast cancer. She has need for Port-A-Cath for venous access for chemotherapy. Risks and benefits were discussed and informed consent was obtained.   DESCRIPTION OF PROCEDURE: The patient is brought to the vascular interventional radiology suite. Right neck and chest were sterilely prepped and draped and a sterile surgical field was created. The right jugular vein was visualized with ultrasound and found to be widely patent. It was then accessed under direct ultrasound guidance without difficulty with a Seldinger needle. A J-wire was placed. After skin nick and dilatation, the peel-away sheath was placed over the wire. I then anesthetized an area 1 to 2 fingerbreadths below the right clavicle. A transverse incision was created. An inferior pocket was created after the transverse incision. The port was secured to the chest wall with 2 Prolene sutures, connected to the catheter and tunneled from the subclavicular incision to the access site. Fluoroscopic guidance was then used to cut the catheter to an appropriate length. It was then placed through the peel-away sheath and the peel-away sheath was removed. The catheter tip was parked in excellent location, at the cavoatrial junction. The port withdrew  blood well and flushed easily with heparinized saline. The wound was irrigated with antibiotic-impregnated saline and closed with 3-0 Vicryl and 4-0 Monocryl and the access incision was closed with a single 4-0 Monocryl. Dermabond was placed as a dressing. The patient tolerated the procedure well and was taken to the recovery room in stable condition.  ____________________________ Algernon Huxley, MD jsd:sb D: 06/05/2012 09:29:58 ET T: 06/05/2012 10:41:20 ET JOB#: [id number]  cc: Algernon Huxley, MD, <Dictator> Algernon Huxley MD ELECTRONICALLY SIGNED 06/05/2012 15:56

## 2014-08-03 NOTE — Consult Note (Signed)
Reason for Visit: This 64 year old Female patient presents to the clinic for initial evaluation of  cancer .   Referred by Dr.Ramiah.  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  Pathology Report pathology report reviewed   Imaging Report PET CT scan is reviewed   Referral Report clinical notes reviewed   HPI   the patient is a 64 year old female status post history of right modified radical mastectomy 1988 at [facility] with adjuvant chemotherapy. She presented with increasing inflammation of her left breastover a six-month period was seen [facility] scan was suspicious for breast malignancy. Biopsy January 2014 was positive for lobular carcinoma. At the time seen by surgical oncology multiple chest wall biopsies were positive for invasive mammary carcinoma and cover to large area for surgical resection. She was started on neoadjuvant chemotherapy including 6 cycles of Taxotere and Cytoxan with good response confirmed on PET CT scan. She's been currently on Femara maintenance therapy repeat CT scan shows PET positivity in the left chest wall. She's also developed some small satellite skin nodules consistent with metastatic disease to her skin. She has no evidence of skin ulceration at this time. Case was discussed at tumor conference I have personally discussed the case with surgical oncology and medical oncology. At this time she seen for evaluation for radiation therapy to her left breast as the only site of disease. She does on PET CT scan has some small level II cervical lymph nodes in my review do not think these are significant. She otherwise specifically  denies chest wall pain or tenderness.  Past Hx:    Breast Cancer:    Mastectomy:    Nephrectomy:   Past, Family and Social History:  Past Medical History positive   Past Surgical History right mastectomy as described above, status post nephrectomy   Family History positive   Social History positive   Social History Comments continues to smoke a half a pack of cigarettes a day greater than 20-pack-year smoking history no EtOH abuse history   Allergies:   Sulfa drugs: N/V/Diarrhea, Hives  Home Meds:  Home Medications: Medication Instructions Status  letrozole 2.5 mg oral tablet 1 tab(s) orally once a day Active   Review of Systems:  General negative   Performance Status (ECOG) 0   Skin see HPI   Breast see HPI   Ophthalmologic negative   ENMT negative   Respiratory and Thorax negative   Cardiovascular negative   Gastrointestinal negative   Genitourinary negative   Musculoskeletal negative   Neurological negative   Psychiatric negative   Hematology/Lymphatics negative   Endocrine negative   Allergic/Immunologic negative   Review of Systems   denies any weight loss, fatigue, weakness, fever, chills or night sweats. Patient denies any loss of vision, blurred vision. Patient denies any ringing  of the ears or hearing loss. No irregular heartbeat. Patient denies heart murmur or history of fainting. Patient denies any chest pain or pain radiating to her upper extremities. Patient denies any shortness of breath, difficulty breathing at night, cough or hemoptysis. Patient denies any swelling in the lower legs. Patient denies any nausea vomiting, vomiting of blood, or coffee ground material in the vomitus. Patient denies any stomach pain. Patient  states has had normal bowel movements no significant constipation or diarrhea. Patient denies any dysuria, hematuria or significant nocturia. Patient denies any problems walking, swelling in the joints or loss of  balance. Patient denies any skin changes, loss of hair or loss of weight. Patient denies any excessive worrying or anxiety or significant depression. Patient denies any problems with insomnia. Patient denies excessive thirst, polyuria, polydipsia. Patient denies any swollen glands, patient denies easy bruising or easy bleeding. Patient denies any recent infections, allergies or URI. Patient "s visual fields have not changed significantly in recent time.   Nursing Notes:  Nursing Vital Signs and Chemo Nursing Nursing Notes: *CC Vital Signs Flowsheet:   20-May-15 14:04  Temp Temperature 96.6  Pulse Pulse 88  Respirations Respirations 20  SBP SBP 122  DBP DBP 75  Pain Scale (0-10)  0  Current Weight (kg) (kg) 80.9   Physical Exam:  General/Skin/HEENT:  General normal   Eyes normal   ENMT normal   Head and Neck normal   Additional PE well-developed female in NAD. She status post right modified radical mastectomy chest walls clear without evidence of mass or nodularity. Left breast is markedly deformed and trunk and. It is firm consistent with diffuse tumor involvement. No axillary or supraclavicular adenopathy is identified no lymph edema in her upper extremities is noted. Lungs are clear to A&P cardiac examination shows regular rate and rhythm.   Breasts/Resp/CV/GI/GU:  Respiratory and Thorax normal   Cardiovascular normal   Gastrointestinal normal   Genitourinary normal   MS/Neuro/Psych/Lymph:  Musculoskeletal normal   Neurological normal   Lymphatics normal   Other Results:  Radiology Results: LabUnknown:    10-Feb-14 15:50, PET/CT Scan Breast CA Stage/Restaging  PACS Image     05-May-15 10:04, PET/CT Scan Breast CA Stage/Restaging  PACS Image   Nuclear Med:    10-Feb-14 15:50, PET/CT Scan Breast CA Stage/Restaging  PET/CT Scan Breast CA Stage/Restaging   REASON FOR EXAM:    metastatic breast CA  COMMENTS:       PROCEDURE: PET - PET/CT RESTG BREAST CA  - May 22 2012  3:50PM     RESULT:     FINDINGS: The patient has a fasting blood glucose level of 90 mg/dl. The   patient received an injection of 11.21mCi of fluorine 18 labeled   fluorodeoxyglucose in the left antecubital region at 13:50 hours with   images obtained from the base of the brain into the thighs between the   hours of 15:18 and 15:39. Injection of tracer was in the left antecubital   region. A low dose, noncontrast CT is performed over the same regions for   the purposes of attenuation correction and fusion. The CT, PET and fused   PET/CT data are reconstructed by the [employer] software in the axial,     coronal and sagittal planes. A rotating three dimensional maximum   intensity pixel projection PET image is also created.     The patient has no previous PET study for comparison.    FINDINGS: There is extensive abnormal accumulation of F18-FDG over the   left chest wall and breast region especially in the area of the nipple   and superiorly. This extends into the left axillary region. There is some   left supraclavicular activity present. There is some skin thickening   present. There does not appear to be evidence of abnormal localization   along the internal mammary chain. There is some increased localization  along the medial left pectoralis region. Some of the changes may   represent residual disease. The possibility of abnormal uptake secondary   to surgery and or radiation changes is not excluded. There is no abnormal   accumulation of tracer within the abdomen or pelvis. The liver, spleen     and kidneys show no abnormal accumulation of activity. No abnormal bony   localization is appreciated. Maximal uptake over the left breast region   is 12.74 with a mean of 6.99 SUV. The findings are most concerning for   residual malignant disease. Uptake in the supraclavicular aspect medially   on the left demonstrates a maximum SUV of 8.41 with a mean of 4.98.    IMPRESSION:   Findings concerning for multiple areas of progressive or   persistent disease over the left breast and axillary region as well as in   the supraclavicular aspect medially on the left. Significant skin   thickening is present. Post surgical changes are present presumably. Some   distortion of the skin surface secondary to disease is not completely   excluded. It appears the patient has undergone previous right mastectomy.      Thank you for this opportunity to contribute to the care of your patient.   Dictation Site: 1        Verified By: Sundra Aland, M.D., MD    [phone] 10:04, PET/CT Scan Breast CA Stage/Restaging  PET/CT Scan Breast CA Stage/Restaging   REASON FOR EXAM:    breast CA restage  COMMENTS:       PROCEDURE: PET - PET/CT RESTG BREAST CA  - Aug 14 2013 10:04AM     CLINICAL DATA:  Subsequent treatment strategy for breast carcinoma.  Status post chemotherapy.    EXAM:  NUCLEAR MEDICINE PET SKULL BASE TO THIGH    TECHNIQUE:  12.6 mCi F-18 FDG was injected intravenously. Full-ring PET imaging  was performed from the skull base to thigh after the radiotracer. CT  data was obtained and used for attenuation correction and anatomic  localization.    FASTING BLOOD GLUCOSE:  Value: 107 mg/dl    COMPARISON:  05/03/2013    FINDINGS:  NECK    No hypermetabolic lymph nodes in the neck. Previously noted small  left cervical level 2 lymph node no longer shows hypermetabolic  activity.    CHEST  No hypermetabolic mediastinal or hilar nodes. No suspicious  pulmonary nodules on the CT scan. There a 3 hypermetabolic nodules  in the lower inner and outer quadrants of the left breast which show  increased size and hypermetabolic activity compared to previous  study. The largest of these in the lower inner quadrant measures  approximately 2.4 cm and has a maximum SUV of 12.5. The 2 others  have maximum SUVs of approximately 7 in 12. No hypermetabolic  axillary lymph  nodes identified. Previous right mastectomy again  noted.    ABDOMEN/PELVIS    No abnormal hypermetabolic activity within the liver, pancreas,  adrenal glands, or spleen. No hypermetabolic lymph nodes in the  abdomen or pelvis.  SKELETON    No focal hypermetabolic activity to suggest skeletal metastasis.     IMPRESSION:  Increase in size and activity of 3 hypermetabolic nodules in the  lower inner and outer quadrants of the left breast, suspicious for  carcinoma. Dedicated breast imaging evaluation recommended.    No evidence of hypermetabolic axillary lymphadenopathy or other  sites of metabolically active metastatic disease. Resolution of  metabolic activity  within the left level 2 cervical lymph node since  prior exam.    Electronically Signed    By: Earle Gell M.D.    On: 08/14/2013 11:45         Verified By: Marlaine Hind, M.D.,   Relevent Results:   Relevant Scans and Labs serial PET/CT scans are reviewed   Assessment and Plan: Impression:   stage IIIB invasive mammary carcinoma the left breast unresectable based on extent of disease and chest wall involvement in 65 year-old female status post neoadjuvant chemotherapy currently on aromatase inhibitor Plan:   at this time I believe we should try to eradicate disease in her left breast with radiation. She is developing skin nodules and soon will develop ulceration of the skin which may not require narcotic analgesics. I will plan on delivering 5000 cGy to left breast and peripheral lymphatics boosting her chest wall another 2000 cGy using electron beam incorporating areas of PET positivity are her most current PET/CT scan. Risks and benefits of treatment including skin reaction, fatigue, alteration of blood counts, inclusions and superficial lung, possible lymphedema of her left upper extremity all were described in detail to the patient. She seems to comprehend my treatment plan well. Again case was discussed our weekly  tumor conference. I have set her up for CT simulation next week.  I would like to take this opportunity for allowing me to participate in the care of your patient..  Electronic Signatures: Armstead Peaks (MD)  (Signed 20-May-15 15:17)  Authored: HPI, Diagnosis, Past Hx, PFSH, Allergies, Home Meds, ROS, Nursing Notes, Physical Exam, Other Results, Relevent Results, Encounter Assessment and Plan   Last Updated: 20-May-15 15:17 by Armstead Peaks (MD)

## 2014-08-06 ENCOUNTER — Encounter: Payer: Self-pay | Admitting: Hematology and Oncology

## 2014-08-06 DIAGNOSIS — C50912 Malignant neoplasm of unspecified site of left female breast: Secondary | ICD-10-CM

## 2014-08-06 LAB — COMPREHENSIVE METABOLIC PANEL
Albumin: 4.2 g/dL
Alkaline Phosphatase: 81 U/L
Anion Gap: 7 (ref 7–16)
BUN: 15 mg/dL
Bilirubin,Total: 0.7 mg/dL
Calcium, Total: 9.4 mg/dL
Chloride: 103 mmol/L
Co2: 26 mmol/L
Creatinine: 0.9 mg/dL
EGFR (African American): >60
EGFR (Non-African Amer.): >60
Glucose: 97 mg/dL
Potassium: 3.8 mmol/L
SGOT(AST): 25 U/L
SGPT (ALT): 24 U/L
Sodium: 136 mmol/L
Total Protein: 7.7 g/dL

## 2014-08-06 LAB — CBC CANCER CENTER
Basophil #: 0 x10 3/mm (ref 0.0–0.1)
Basophil %: 0.6 %
Eosinophil #: 0.1 x10 3/mm (ref 0.0–0.7)
Eosinophil %: 1.6 %
HCT: 41.6 % (ref 35.0–47.0)
HGB: 13.9 g/dL (ref 12.0–16.0)
Lymphocyte #: 1.3 x10 3/mm (ref 1.0–3.6)
Lymphocyte %: 32.6 %
MCH: 31.9 pg (ref 26.0–34.0)
MCHC: 33.5 g/dL (ref 32.0–36.0)
MCV: 95 fL (ref 80–100)
Monocyte #: 0.6 x10 3/mm (ref 0.2–0.9)
Monocyte %: 15.7 %
Neutrophil #: 1.9 x10 3/mm (ref 1.4–6.5)
Neutrophil %: 49.5 %
Platelet: 170 x10 3/mm (ref 150–440)
RBC: 4.36 10*6/uL (ref 3.80–5.20)
RDW: 18.6 % — ABNORMAL HIGH (ref 11.5–14.5)
WBC: 3.8 x10 3/mm (ref 3.6–11.0)

## 2014-08-07 LAB — CANCER ANTIGEN 27.29: CA 27.29: 339.8 U/mL — ABNORMAL HIGH (ref 0.0–38.6)

## 2014-08-07 LAB — CREATININE, SERUM: CREATINE, SERUM: 0.96

## 2014-08-12 ENCOUNTER — Other Ambulatory Visit: Payer: Self-pay | Admitting: *Deleted

## 2014-08-13 ENCOUNTER — Encounter: Payer: Self-pay | Admitting: *Deleted

## 2014-08-13 ENCOUNTER — Other Ambulatory Visit: Payer: Self-pay | Admitting: Hematology and Oncology

## 2014-08-13 ENCOUNTER — Inpatient Hospital Stay (HOSPITAL_BASED_OUTPATIENT_CLINIC_OR_DEPARTMENT_OTHER): Payer: BLUE CROSS/BLUE SHIELD | Admitting: Hematology and Oncology

## 2014-08-13 ENCOUNTER — Encounter: Payer: Self-pay | Admitting: Hematology and Oncology

## 2014-08-13 ENCOUNTER — Inpatient Hospital Stay: Payer: BLUE CROSS/BLUE SHIELD | Attending: Hematology and Oncology

## 2014-08-13 ENCOUNTER — Inpatient Hospital Stay: Payer: BLUE CROSS/BLUE SHIELD

## 2014-08-13 VITALS — BP 123/80 | HR 92 | Temp 98.0°F | Resp 18 | Ht 67.5 in | Wt 179.5 lb

## 2014-08-13 DIAGNOSIS — F17210 Nicotine dependence, cigarettes, uncomplicated: Secondary | ICD-10-CM | POA: Diagnosis not present

## 2014-08-13 DIAGNOSIS — Z853 Personal history of malignant neoplasm of breast: Secondary | ICD-10-CM | POA: Diagnosis not present

## 2014-08-13 DIAGNOSIS — Z9011 Acquired absence of right breast and nipple: Secondary | ICD-10-CM | POA: Diagnosis not present

## 2014-08-13 DIAGNOSIS — C50912 Malignant neoplasm of unspecified site of left female breast: Secondary | ICD-10-CM

## 2014-08-13 DIAGNOSIS — C792 Secondary malignant neoplasm of skin: Principal | ICD-10-CM

## 2014-08-13 DIAGNOSIS — J439 Emphysema, unspecified: Secondary | ICD-10-CM | POA: Diagnosis not present

## 2014-08-13 DIAGNOSIS — Z79899 Other long term (current) drug therapy: Secondary | ICD-10-CM | POA: Insufficient documentation

## 2014-08-13 DIAGNOSIS — Z170 Estrogen receptor positive status [ER+]: Secondary | ICD-10-CM | POA: Insufficient documentation

## 2014-08-13 LAB — BASIC METABOLIC PANEL
Anion gap: 5 (ref 5–15)
BUN: 13 mg/dL (ref 6–20)
CO2: 26 mmol/L (ref 22–32)
Calcium: 8.8 mg/dL — ABNORMAL LOW (ref 8.9–10.3)
Chloride: 103 mmol/L (ref 101–111)
Creatinine, Ser: 0.83 mg/dL (ref 0.44–1.00)
GFR calc Af Amer: >60 mL/min (ref >60)
GFR calc non Af Amer: >60 mL/min (ref >60)
Glucose, Bld: 127 mg/dL — ABNORMAL HIGH (ref 65–99)
Potassium: 3.5 mmol/L (ref 3.5–5.1)
Sodium: 134 mmol/L — ABNORMAL LOW (ref 135–145)

## 2014-08-13 LAB — CBC WITH DIFFERENTIAL/PLATELET
Basophils Absolute: 0 10*3/uL (ref 0–0.1)
Basophils Relative: 1 %
Eosinophils Absolute: 0 10*3/uL (ref 0–0.7)
Eosinophils Relative: 1 %
HCT: 39.2 % (ref 35.0–47.0)
Hemoglobin: 13.1 g/dL (ref 12.0–16.0)
Lymphocytes Relative: 46 %
Lymphs Abs: 1.4 10*3/uL (ref 1.0–3.6)
MCH: 32 pg (ref 26.0–34.0)
MCHC: 33.4 g/dL (ref 32.0–36.0)
MCV: 95.7 fL (ref 80.0–100.0)
Monocytes Absolute: 0.3 10*3/uL (ref 0.2–0.9)
Monocytes Relative: 12 %
Neutro Abs: 1.2 10*3/uL — ABNORMAL LOW (ref 1.4–6.5)
Neutrophils Relative %: 42 %
Platelets: 203 10*3/uL (ref 150–440)
RBC: 4.1 MIL/uL (ref 3.80–5.20)
RDW: 18.4 % — ABNORMAL HIGH (ref 11.5–14.5)
WBC: 3 10*3/uL — ABNORMAL LOW (ref 3.6–11.0)

## 2014-08-13 MED ORDER — HEPARIN SOD (PORK) LOCK FLUSH 100 UNIT/ML IV SOLN
500.0000 [IU] | Freq: Once | INTRAVENOUS | Status: AC | PRN
  Administered 2014-08-13: 500 [IU]
  Filled 2014-08-13: qty 5

## 2014-08-13 MED ORDER — INV-ERIBULIN CHEMO INJECTION 1 MG/2ML RU011201I ACCRU
1.4000 mg/m2 | Freq: Once | INTRAVENOUS | Status: AC
  Administered 2014-08-13: 2.7 mg via INTRAVENOUS
  Filled 2014-08-13: qty 5.4

## 2014-08-13 MED ORDER — SODIUM CHLORIDE 0.9 % IV SOLN
Freq: Once | INTRAVENOUS | Status: AC
  Administered 2014-08-13: 17:00:00 via INTRAVENOUS
  Filled 2014-08-13: qty 250

## 2014-08-13 MED ORDER — ONDANSETRON HCL 40 MG/20ML IJ SOLN
Freq: Once | INTRAMUSCULAR | Status: AC
  Administered 2014-08-13: 17:00:00 via INTRAVENOUS
  Filled 2014-08-13: qty 4

## 2014-08-13 MED ORDER — SODIUM CHLORIDE 0.9 % IJ SOLN
10.0000 mL | INTRAMUSCULAR | Status: DC | PRN
Start: 2014-08-13 — End: 2015-02-04
  Filled 2014-08-13: qty 10

## 2014-08-13 NOTE — Progress Notes (Signed)
[facility] day:  08/13/2014  Chief Complaint: [name] [name] is an 64 y.o. female with recurrent breast cancer who is seen for assessment prior to day 8 of cycle #1 Halaven.  HPI: The patient was last seen in the medical onoclogy clinic on 08/06/2014.  At that time, she began treatment on ACCRU [id number] I (randomization between Halaven and Taxol).  She randomized to Halaven.  She received her first week of Halaven uneventfully.    She states that she tolerated her chemotherapy well.  She notes no side effects.  She did not pick up her anti-emetic.  She has had less chest wall itching.  She is going to wound care every other week.  She feels good.  Past Medical History  Diagnosis Date  . Personal history of malignant neoplasm of breast   . Breast cancer   . Anemia   . Blood transfusion without reported diagnosis   . Emphysema of lung     Past Surgical History  Procedure Laterality Date  . Oophrectomy  1980  . Abdominal hysterectomy  1995  . Breast surgery Right 1996    mastectomy  . Breast biopsy Left 2014  . Breast biopsy Left 01-21-14    Family History  Problem Relation Age of Onset  . Cancer Father   . Heart disease Sister   . Diabetes Sister   . Heart disease Brother   . Cancer Paternal Grandmother     breast    Social History:  reports that she has been smoking Cigarettes.  She has a 25 pack-year smoking history. She has never used smokeless tobacco. She reports that she does not drink alcohol or use illicit drugs.  Allergies:  Allergies  Allergen Reactions  . Sulfa Antibiotics Nausea And Vomiting    headache    Current Outpatient Prescriptions  Medication Sig Dispense Refill  . Calcium Carbonate-Vitamin D (CALCIUM + D PO) Take 2 tablets by mouth daily.    Marland Kitchen KLOR-CON 10 10 MEQ tablet   0  . traMADol (ULTRAM) 50 MG tablet Take 50 mg by mouth every 8 (eight) hours as needed.     No current facility-administered  medications for this visit.   ROS GENERAL:  Feels good.  Active.  No fevers, sweats or weight loss. PERFORMANCE STATUS (ECOG):  1 HEENT:  No visual changes, runny nose, sore throat, mouth sores or tenderness. Lungs: No shortness of breath or cough.  No hemoptysis. Cardiac:  No chest pain, palpitations, orthopnea, or PND. GI:  No nausea, vomiting, diarrhea, constipation, melena or hematochezia. GU:  No urgency, frequency, dysuria, or hematuria. Musculoskeletal:  No back pain.  No joint pain.  No muscle tenderness. Extremities:  No pain or swelling. Skin:  Less chest wall itching.  No changes in left chest wall cutaneous breast cancer. Neuro:  No headache, numbness or weakness, balance or coordination issues. Endocrine:  No diabetes, thyroid issues, hot flashes or night sweats. Psych:  No mood changes, depression or anxiety. Pain:  No focal pain. Review of systems:  All other systems reviewed and found to be negative.   Physical Exam  Blood pressure 123/80, pulse 92, temperature 98 F (36.7 C), temperature source Tympanic, resp. rate 18, height 5' 7.5" (1.715 m), weight 179 lb 7.3 oz (81.4 kg). GENERAL:  Well developed, well nourished woman sitting comfortably in the exam room in no acute distress. MENTAL STATUS:  Alert and oriented to person, place and time. HEAD:  Ow[place]ark short  hair with slight graying.  Normocephalic, atraumatic, face symmetric, no Cushingoid features. EYES:  Brown eyes.  Pupils equal round and reactive to light and accomodation.  No conjunctivitis or scleral icterus. ENT:  Oropharynx clear without lesion.  Tongue normal. Mucous membranes moist.  RESPIRATORY:  Clear to auscultation without rales, wheezes or rhonchi. CARDIOVASCULAR:  Regular rate and rhythm without murmur, rub or gallop. BREAST:  Left chest wall nodularity unchanged.  Left lateral chest wall denuded area more dry.  Healing right midsternal small denided area. ABDOMEN:  Soft, non-tender, with active bowel  sounds, and no hepatosplenomegaly.  No masses. SKIN:  No rashes, ulcers or lesions. EXTREMITIES: No edema, no skin discoloration or tenderness.  No palpable cords. LYMPH NODES: Left axillary ridge more soft.  No palpable cervical, supraclavicular, axillary or inguinal adenopathy  NEUROLOGICAL: Unremarkable. PSYCH:  Appropriate.   Clinical Support on 08/13/2014  Component Date Value Ref Range Status  . WBC 08/13/2014 3.0* 3.6 - 11.0 K/uL Final  . RBC 08/13/2014 4.10  3.80 - 5.20 MIL/uL Final  . Hemoglobin 08/13/2014 13.1  12.0 - 16.0 g/dL Final  . HCT 08/13/2014 39.2  35.0 - 47.0 % Final  . MCV 08/13/2014 95.7  80.0 - 100.0 fL Final  . MCH 08/13/2014 32.0  26.0 - 34.0 pg Final  . MCHC 08/13/2014 33.4  32.0 - 36.0 g/dL Final  . RDW 08/13/2014 18.4* 11.5 - 14.5 % Final  . Platelets 08/13/2014 203  150 - 440 K/uL Final  . Neutrophils Relative % 08/13/2014 42%   Final  . Neutro Abs 08/13/2014 1.2* 1.4 - 6.5 K/uL Final  . Lymphocytes Relative 08/13/2014 46%   Final  . Lymphs Abs 08/13/2014 1.4  1.0 - 3.6 K/uL Final  . Monocytes Relative 08/13/2014 12%   Final  . Monocytes Absolute 08/13/2014 0.3  0.2 - 0.9 K/uL Final  . Eosinophils Relative 08/13/2014 1%   Final  . Eosinophils Absolute 08/13/2014 0.0  0 - 0.7 K/uL Final  . Basophils Relative 08/13/2014 1%   Final  . Basophils Absolute 08/13/2014 0.0  0 - 0.1 K/uL Final   Assessment:  The patient is a 64 year old woman with a history of stage I right breast cancer s/p modified radical mastectomy on 06/12/1996.  Pathology revealed a grade III mutifocal infiltrating ductal carcinoma (tumor size 8.5 cm with an invasive component 1.6 cm).  Twenty-one lymph nodes were negative.  Tumor was ER/PR negative.  She received 4 cycles of AC at [facility].  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  PET scan on 05/22/2012 revealed multiple areas of  disease.  She received 6 cycles of Taxotere and Cytoxan (TC) from 06/06/2012 - 09/29/2012.  PET scan  on 09/07/2012 noted marked improvement.  Post chemotherapy biopsy was positive.  She was not a surgical candidate.  She was placed on Femara.  PET scan on 05/03/2013 revealed a new focus along lateral left breast and a new level II cervical lymph node.  She received radiation from 09/13/2013 - 11/05/2013.  PET scan on 01/01/2014 revealed new nodule medial left chest wall pleural/paraspinal activity LUL.  Multiple biopsies on 01/19/2014 were positive.   She began daily letrozole and Ibrance on 02/12/2014.  Despite treatment, new nodules formed.  She received a total of 2 cycles.    PET scan on 05/09/2014 revealed marked progression of disease with multifocal masses in chest wall (left > right).  She  did not receive Imbrance in 04/2014. CA27.29 has increased:  22.9 on 01/08/2014, 54 on 03/05/2014, 170.8 on 05/06/2014, 250.8 on 05/23/2014, 445.8 on 06/14/2014, and 531.1 on 07/01/2104.    She received 3 cycles of Xeloda (02/12 - 07/05/2014).  Chest and abdomen CT scan on 07/05/2014 revealed persistent and slightly progressive diffuse locally invasive chest wall tumor.  Bone scan on 07/25/2014 revealed no evidence of metastatic disease.    She is currently day 8 of cycle #1 Halaven (began 08/06/2014) on ACCRU [id number] I.  She tolerated her first treatment well without side effect.  Exam reveals slight softening in the left axillae.  Plan: 1)  Labs today:  CBC with diff, BMP. 2)  Day 8 Halaven. 3)  RTC in 1 week for CBC with diff. 4)  RTC in 2 weeks for MD assessment, labs (CBC with diff, CMP, Mg, CA27.29) and cycle #2 Halaven.   Lequita Asal, MD  08/13/2014, 3:28 PM

## 2014-08-13 NOTE — Progress Notes (Signed)
08/13/2014 Patient returns to clinic for Cycle 1 Day 8 Eribulin infusion. She reports feeling well and has no new complaints, but had reported earlier that she was not able to get into her [employer] phone survey this week and that she did not receive a reminder call. T/C made to [employer] contact, and he reports he has reset the survey date, and that the patient should receive weekly reminder phone calls from now on. Patient was provided paper survey to complete prior to her treatment today. She continues to report fatigue, but states this is interfering with her ADLs "a little bit"; therefore is a grade 1 with attribution of possibly related to the study drug. Reports she is having a little difficulty with sleep and rates this as "mild" but also reports it is not new; grade 1 & unrelated. Continues to esperience mild or grade 1 neuropathy, which has not changed since baseline and grade 1 pain, which is also the same as baseline; so both are unrelated to the Eribulin. She does report some appetite changes and states she can tell she is not as hungry - but is making herself eat anyway. CBC, chemistries and VS are all within acceptable parameters for patient to receive her treatment today. [name] has also assessed [name] and we will proceed with C1D8 Eribulin as scheduled.

## 2014-08-15 ENCOUNTER — Encounter: Payer: BLUE CROSS/BLUE SHIELD | Attending: Surgery | Admitting: Surgery

## 2014-08-15 DIAGNOSIS — S21001A Unspecified open wound of right breast, initial encounter: Secondary | ICD-10-CM | POA: Insufficient documentation

## 2014-08-15 DIAGNOSIS — C50412 Malignant neoplasm of upper-outer quadrant of left female breast: Secondary | ICD-10-CM | POA: Insufficient documentation

## 2014-08-15 DIAGNOSIS — S21002A Unspecified open wound of left breast, initial encounter: Secondary | ICD-10-CM | POA: Insufficient documentation

## 2014-08-15 DIAGNOSIS — C50812 Malignant neoplasm of overlapping sites of left female breast: Secondary | ICD-10-CM | POA: Insufficient documentation

## 2014-08-15 DIAGNOSIS — X58XXXA Exposure to other specified factors, initial encounter: Secondary | ICD-10-CM | POA: Diagnosis not present

## 2014-08-16 NOTE — Progress Notes (Signed)
[name] [name], [name] [name] ([id number]) Visit Report for 08/15/2014 Arrival Information Details Patient Name: [name] [name] Date of Service: 08/15/2014 8:00 AM Medical Record Number: [id number] Patient Account Number: [id number] Date of Birth/Sex: 14-Dec-1950 (64 y.o. Female) Treating RN: Afful, RN, BSN, [employer] Primary Care Physician: PATIENT, NO Other Clinician: Referring Physician: Treating Physician/Extender: Frann Rider in Treatment: 5 Visit Information History Since Last Visit Any new allergies or adverse reactions: No Patient Arrived: Ambulatory Had a fall or experienced change in No Arrival Time: 08:14 activities of daily living that may affect Accompanied By: sef risk of falls: Transfer Assistance: None Signs or symptoms of abuse/neglect since last No Patient Identification Verified: Yes visito Secondary Verification Process Yes Hospitalized since last visit: No Completed: Has Dressing in Place as Prescribed: Yes Patient Requires Transmission-Based No Pain Present Now: No Precautions: Patient Has Alerts: No Electronic Signature(s) Signed: 08/15/2014 5:21:09 PM By: Regan Lemming BSN, RN Entered By: Regan Lemming on 08/15/2014 08:15:44 [name] [name] ([id number]) -------------------------------------------------------------------------------- Clinic Level of Care Assessment Details Patient Name: [name] [name] Date of Service: 08/15/2014 8:00 AM Medical Record Number: [id number] Patient Account Number: [id number] Date of Birth/Sex: [birth year] (64 y.o. Female) Treating RN: Montey Hora Primary Care Physician: PATIENT, NO Other Clinician: Referring Physician: Treating Physician/Extender: Frann Rider in Treatment: 5 Clinic Level of Care Assessment Items TOOL 4 Quantity Score []  - Use when only an EandM is performed on FOLLOW-UP visit 0 ASSESSMENTS - Nursing Assessment / Reassessment X - Reassessment of Co-morbidities (includes updates in patient status) 1 10 X  - Reassessment of Adherence to Treatment Plan 1 5 ASSESSMENTS - Wound and Skin Assessment / Reassessment []  - Simple Wound Assessment / Reassessment - one wound 0 X - Complex Wound Assessment / Reassessment - multiple wounds 3 5 []  - Dermatologic / Skin Assessment (not related to wound area) 0 ASSESSMENTS - Focused Assessment []  - Circumferential Edema Measurements - multi extremities 0 []  - Nutritional Assessment / Counseling / Intervention 0 []  - Lower Extremity Assessment (monofilament, tuning fork, pulses) 0 []  - Peripheral Arterial Disease Assessment (using hand held doppler) 0 ASSESSMENTS - Ostomy and/or Continence Assessment and Care []  - Incontinence Assessment and Management 0 []  - Ostomy Care Assessment and Management (repouching, etc.) 0 PROCESS - Coordination of Care X - Simple Patient / Family Education for ongoing care 1 15 []  - Complex (extensive) Patient / Family Education for ongoing care 0 []  - Staff obtains Programmer, systems, Records, Test Results / Process Orders 0 []  - Staff telephones HHA, Nursing Homes / Clarify orders / etc 0 []  - Routine Transfer to another Facility (non-emergent condition) 0 [name] [name], [name] [name] ([id number]) []  - Routine Hospital Admission (non-emergent condition) 0 []  - New Admissions / Biomedical engineer / Ordering NPWT, Apligraf, etc. 0 []  - Emergency Hospital Admission (emergent condition) 0 X - Simple Discharge Coordination 1 10 []  - Complex (extensive) Discharge Coordination 0 PROCESS - Special Needs []  - Pediatric / Minor Patient Management 0 []  - Isolation Patient Management 0 []  - Hearing / Language / Visual special needs 0 []  - Assessment of Community assistance (transportation, D/C planning, etc.) 0 []  - Additional assistance / Altered mentation 0 []  - Support Surface(s) Assessment (bed, cushion, seat, etc.) 0 INTERVENTIONS - Wound Cleansing / Measurement []  - Simple Wound Cleansing - one wound 0 X - Complex Wound Cleansing - multiple  wounds 3 5 X - Wound Imaging (photographs - any number of wounds) 1 5 []  - Wound Tracing (instead of photographs) 0 []  -  Simple Wound Measurement - one wound 0 X - Complex Wound Measurement - multiple wounds 3 5 INTERVENTIONS - Wound Dressings X - Small Wound Dressing one or multiple wounds 3 10 []  - Medium Wound Dressing one or multiple wounds 0 []  - Large Wound Dressing one or multiple wounds 0 []  - Application of Medications - topical 0 []  - Application of Medications - injection 0 INTERVENTIONS - Miscellaneous []  - External ear exam 0 [name] [name], [name] ([id number]) []  - Specimen Collection (cultures, biopsies, blood, body fluids, etc.) 0 []  - Specimen(s) / Culture(s) sent or taken to Lab for analysis 0 []  - Patient Transfer (multiple staff / Harrel Lemon Lift / Similar devices) 0 []  - Simple Staple / Suture removal (25 or less) 0 []  - Complex Staple / Suture removal (26 or more) 0 []  - Hypo / Hyperglycemic Management (close monitor of Blood Glucose) 0 []  - Ankle / Brachial Index (ABI) - do not check if billed separately 0 X - Vital Signs 1 5 Has the patient been seen at the hospital within the last three years: Yes Total Score: 125 Level Of Care: New/Established - Level 4 Electronic Signature(s) Signed: 08/15/2014 5:45:27 PM By: Montey Hora Entered By: Montey Hora on 08/15/2014 08:35:38 [name] [name] ([id number]) -------------------------------------------------------------------------------- Encounter Discharge Information Details Patient Name: [name] [name] Date of Service: 08/15/2014 8:00 AM Medical Record Number: [id number] Patient Account Number: [id number] Date of Birth/Sex: 1951/01/20 (64 y.o. Female) Treating RN: Afful, RN, BSN, Velva Harman Primary Care Physician: PATIENT, NO Other Clinician: Referring Physician: Treating Physician/Extender: Frann Rider in Treatment: 5 Encounter Discharge Information Items Discharge Pain Level: 0 Discharge Condition:  Stable Ambulatory Status: Ambulatory Discharge Destination: Home Transportation: Private Auto Accompanied By: self Schedule Follow-up Appointment: No Medication Reconciliation completed and provided to Patient/Care No Mehki Klumpp: Provided on Clinical Summary of Care: 08/15/2014 Form Type Recipient Paper Patient EG Electronic Signature(s) Signed: 08/15/2014 5:21:09 PM By: Regan Lemming BSN, RN Previous Signature: 08/15/2014 8:41:39 AM Version By: Ruthine Dose Entered By: Regan Lemming on 08/15/2014 08:43:31 [name] [name] ([id number]) -------------------------------------------------------------------------------- Lower Extremity Assessment Details Patient Name: [name] [name] Date of Service: 08/15/2014 8:00 AM Medical Record Number: [id number] Patient Account Number: [id number] Date of Birth/Sex: Aug 14, 1950 (63 y.o. Female) Treating RN: Baruch Gouty, RN, BSN, [employer] Primary Care Physician: PATIENT, NO Other Clinician: Referring Physician: Treating Physician/Extender: Frann Rider in Treatment: 5 Electronic Signature(s) Signed: 08/15/2014 5:21:09 PM By: Regan Lemming BSN, RN Entered By: Regan Lemming on 08/15/2014 08:16:30 [name] [name] ([id number]) -------------------------------------------------------------------------------- Multi Wound Chart Details Patient Name: [name] [name] Date of Service: 08/15/2014 8:00 AM Medical Record Number: [id number] Patient Account Number: [id number] Date of Birth/Sex: 07/29/1950 (63 y.o. Female) Treating RN: Montey Hora Primary Care Physician: PATIENT, NO Other Clinician: Referring Physician: Treating Physician/Extender: Frann Rider in Treatment: 5 Vital Signs Height(in): 68 Pulse(bpm): 90 Weight(lbs): 180.5 Blood Pressure 111/65 (mmHg): Body Mass Index(BMI): 27 Temperature(F): 98.4 Respiratory Rate 16 (breaths/min): Photos: [1:No Photos] [2:No Photos] [3:No Photos] Wound Location: [1:Left Chest] [2:Right Breast] [3:Right  Breast - Medial] Wounding Event: [1:Other Lesion] [2:Other Lesion] [3:Other Lesion] Primary Etiology: [1:Malignant Wound] [2:Malignant Wound] [3:Malignant Wound] Comorbid History: [1:Received Chemotherapy, Received Radiation] [2:Received Chemotherapy, Received Radiation] [3:Received Chemotherapy, Received Radiation] Date Acquired: [1:05/10/2014] [2:06/20/2014] [3:06/20/2014] Weeks of Treatment: [1:5] [2:5] [3:5] Wound Status: [1:Open] [2:Open] [3:Open] Measurements L x W x D 4x6.8x0.2 [2:1x1.5x0.1] [3:0.2x0.4x0.1] (cm) Area (cm) : [1:21.363] [2:1.178] [3:0.063] Volume (cm) : [1:4.273] [2:0.118] [3:0.006] % Reduction in Area: [1:9.30%] [2:-4.90%] [3:59.90%] % Reduction in Volume: 9.30% [  2:-5.40%] [3:62.50%] Classification: [1:Full Thickness Without Exposed Support Structures] [2:Full Thickness Without Exposed Support Structures] [3:Full Thickness Without Exposed Support Structures] Exudate Amount: [1:Small] [2:Small] [3:Small] Exudate Type: [1:Serosanguineous] [2:Serous] [3:Serosanguineous] Exudate Color: [1:red, brown] [2:amber] [3:red, brown] Foul Odor After [1:No] [2:Yes] [3:No] Cleansing: Odor Anticipated Due to N/A [2:No] [3:N/A] Product Use: Wound Margin: [1:Distinct, outline attached] [2:Distinct, outline attached] [3:Distinct, outline attached] Granulation Amount: [1:Medium (34-66%)] [2:Large (67-100%)] [3:Large (67-100%)] Granulation Quality: [1:Red] [2:Red] [3:Red] Necrotic Amount: [1:Medium (34-66%)] [2:Small (1-33%)] [3:None Present (0%)] Exposed Structures: DAANA, PETRASEK ([id number]) Fascia: No Fascia: No Fascia: No Fat: No Fat: No Fat: No Tendon: No Tendon: No Tendon: No Muscle: No Muscle: No Muscle: No Joint: No Joint: No Joint: No Bone: No Bone: No Bone: No Limited to Skin Limited to Skin Limited to Skin Breakdown Breakdown Breakdown Epithelialization: Small (1-33%) Small (1-33%) Small (1-33%) Periwound Skin Texture: Induration: Yes Induration: Yes  Induration: Yes Scarring: Yes Scarring: Yes Scarring: Yes Edema: No Edema: No Edema: No Excoriation: No Excoriation: No Excoriation: No Callus: No Callus: No Callus: No Crepitus: No Crepitus: No Crepitus: No Fluctuance: No Fluctuance: No Fluctuance: No Friable: No Friable: No Friable: No Rash: No Rash: No Rash: No Periwound Skin No Abnormalities Noted Moist: Yes No Abnormalities Noted Moisture: Maceration: No Dry/Scaly: No Periwound Skin Color: No Abnormalities Noted No Abnormalities Noted No Abnormalities Noted Temperature: No Abnormality No Abnormality No Abnormality Tenderness on No No No Palpation: Wound Preparation: Ulcer Cleansing: Ulcer Cleansing: Ulcer Cleansing: Rinsed/Irrigated with Rinsed/Irrigated with Rinsed/Irrigated with Saline Saline Saline Topical Anesthetic Topical Anesthetic Topical Anesthetic Applied: Other: 4% Applied: Other: 4% Applied: Other: 4% Lidocaine Lidocaine cream lidocaine Cream Treatment Notes Electronic Signature(s) Signed: 08/15/2014 5:45:27 PM By: Montey Hora Entered By: Montey Hora on 08/15/2014 08:32:33 [name] [name] ([id number]) -------------------------------------------------------------------------------- [place] Details Patient Name: [name] [name] Date of Service: 08/15/2014 8:00 AM Medical Record Number: [id number] Patient Account Number: [id number] Date of Birth/Sex: 1950-05-08 (63 y.o. Female) Treating RN: Montey Hora Primary Care Physician: PATIENT, NO Other Clinician: Referring Physician: Treating Physician/Extender: Frann Rider in Treatment: 5 Active Inactive Abuse / Safety / Falls / Self Care Management Nursing Diagnoses: Potential for falls Goals: Patient will remain injury free Date Initiated: 07/11/2014 Goal Status: Active Interventions: Assess fall risk on admission and as needed Notes: Malignancy/Atypical Etiology Nursing Diagnoses: Knowledge deficit  related to disease process and management of atypical ulcer etiology Goals: Patient/caregiver will verbalize understanding of disease process and disease management of atypical ulcer etiology Date Initiated: 07/11/2014 Goal Status: Active Interventions: Provide education on atypical ulcer etiologies Notes: Nutrition Nursing Diagnoses: Potential for alteratiion in Nutrition/Potential for imbalanced nutrition Goals: [name] [name], [name] [name] ([id number]) Patient/caregiver agrees to and verbalizes understanding of need to use nutritional supplements and/or vitamins as prescribed Date Initiated: 07/11/2014 Goal Status: Active Interventions: Assess patient nutrition upon admission and as needed per policy Notes: Orientation to the Wound Care Program Nursing Diagnoses: Knowledge deficit related to the wound healing center program Goals: Patient/caregiver will verbalize understanding of the [place] Date Initiated: 07/11/2014 Goal Status: Active Interventions: Provide education on orientation to the wound center Notes: Wound/Skin Impairment Nursing Diagnoses: Impaired tissue integrity Goals: Patient will demonstrate a reduced rate of smoking or cessation of smoking Date Initiated: 07/11/2014 Goal Status: Active Patient will have a decrease in wound volume by X% from date: (specify in notes) Date Initiated: 07/11/2014 Goal Status: Active Patient/caregiver will verbalize understanding of skin care regimen Date Initiated: 07/11/2014 Goal Status: Active Ulcer/skin breakdown will have  a volume reduction of 30% by week 4 Date Initiated: 07/11/2014 Goal Status: Active Ulcer/skin breakdown will have a volume reduction of 50% by week 8 Date Initiated: 07/11/2014 Goal Status: Active Ulcer/skin breakdown will have a volume reduction of 80% by week 12 [name] [name], [name] [name] ([id number]) Date Initiated: 07/11/2014 Goal Status: Active Ulcer/skin breakdown will heal within 14 weeks Date  Initiated: 07/11/2014 Goal Status: Active Interventions: Assess patient/caregiver ability to obtain necessary supplies Assess patient/caregiver ability to perform ulcer/skin care regimen upon admission and as needed Assess ulceration(s) every visit Provide education on smoking Provide education on ulcer and skin care Screen for HBO Notes: Electronic Signature(s) Signed: 08/15/2014 5:45:27 PM By: Montey Hora Entered By: Montey Hora on 08/15/2014 08:32:24 [name] [name] ([id number]) -------------------------------------------------------------------------------- Pain Assessment Details Patient Name: [name] [name] Date of Service: 08/15/2014 8:00 AM Medical Record Number: [id number] Patient Account Number: [id number] Date of Birth/Sex: [birth year] (63 y.o. Female) Treating RN: Afful, RN, BSN, Velva Harman Primary Care Physician: PATIENT, NO Other Clinician: Referring Physician: Treating Physician/Extender: Frann Rider in Treatment: 5 Active Problems Location of Pain Severity and Description of Pain Patient Has Paino No Site Locations Pain Management and Medication Current Pain Management: Electronic Signature(s) Signed: 08/15/2014 5:21:09 PM By: Regan Lemming BSN, RN Entered By: Regan Lemming on 08/15/2014 08:15:51 [name] [name] ([id number]) -------------------------------------------------------------------------------- Patient/Caregiver Education Details Patient Name: [name] [name] Date of Service: 08/15/2014 8:00 AM Medical Record Number: [id number] Patient Account Number: [id number] Date of Birth/Gender: Dec 23, 1950 (63 y.o. Female) Treating RN: Baruch Gouty, RN, BSN, Velva Harman Primary Care Physician: PATIENT, NO Other Clinician: Referring Physician: Treating Physician/Extender: Frann Rider in Treatment: 5 Education Assessment Education Provided To: Patient Education Topics Provided Wound/Skin Impairment: Methods: Explain/Verbal Responses: State content  correctly Electronic Signature(s) Signed: 08/15/2014 5:21:09 PM By: Regan Lemming BSN, RN Entered By: Regan Lemming on 08/15/2014 08:43:59 [name] [name] ([id number]) -------------------------------------------------------------------------------- Wound Assessment Details Patient Name: [name] [name] Date of Service: 08/15/2014 8:00 AM Medical Record Number: [id number] Patient Account Number: [id number] Date of Birth/Sex: 1950/04/17 (63 y.o. Female) Treating RN: Afful, RN, BSN, Administrator, sports Primary Care Physician: PATIENT, NO Other Clinician: Referring Physician: Treating Physician/Extender: Frann Rider in Treatment: 5 Wound Status Wound Number: 1 Primary Malignant Wound Etiology: Wound Location: Left Chest Wound Status: Open Wounding Event: Other Lesion Comorbid Received Chemotherapy, Received Date Acquired: 05/10/2014 History: Radiation Weeks Of Treatment: 5 Clustered Wound: No Photos Photo Uploaded By: Regan Lemming on 08/15/2014 17:06:12 Wound Measurements Length: (cm) 4 Width: (cm) 6.8 Depth: (cm) 0.2 Area: (cm) 21.363 Volume: (cm) 4.273 % Reduction in Area: 9.3% % Reduction in Volume: 9.3% Epithelialization: Small (1-33%) Tunneling: No Wound Description Full Thickness Without Exposed Classification: Support Structures Wound Margin: Distinct, outline attached Exudate Small Amount: Exudate Type: Serosanguineous Exudate Color: red, brown Foul Odor After Cleansing: No Wound Bed Granulation Amount: Medium (34-66%) Exposed Structure Granulation Quality: Red Fascia Exposed: No Necrotic Amount: Medium (34-66%) Fat Layer Exposed: No [name] [name], [name] ([id number]) Necrotic Quality: Adherent Slough Tendon Exposed: No Muscle Exposed: No Joint Exposed: No Bone Exposed: No Limited to Skin Breakdown Periwound Skin Texture Texture Color No Abnormalities Noted: No No Abnormalities Noted: Yes Callus: No Temperature / Pain Crepitus: No Temperature: No  Abnormality Excoriation: No Fluctuance: No Friable: No Induration: Yes Localized Edema: No Rash: No Scarring: Yes Moisture No Abnormalities Noted: Yes Wound Preparation Ulcer Cleansing: Rinsed/Irrigated with Saline Topical Anesthetic Applied: Other: 4% Lidocaine, Treatment Notes Wound #1 (Left Chest) 1. Cleansed with: Clean wound with Normal Saline 4. Dressing Applied: Aquacel Ag  5. Secondary Dressing Applied Bordered Foam Dressing Electronic Signature(s) Signed: 08/15/2014 5:21:09 PM By: Regan Lemming BSN, RN Entered By: Regan Lemming on 08/15/2014 08:23:03 [name] [name] ([id number]) -------------------------------------------------------------------------------- Wound Assessment Details Patient Name: [name] [name] Date of Service: 08/15/2014 8:00 AM Medical Record Number: [id number] Patient Account Number: [id number] Date of Birth/Sex: [birth year] (63 y.o. Female) Treating RN: Afful, RN, BSN, Administrator, sports Primary Care Physician: PATIENT, NO Other Clinician: Referring Physician: Treating Physician/Extender: Frann Rider in Treatment: 5 Wound Status Wound Number: 2 Primary Malignant Wound Etiology: Wound Location: Right Breast Wound Status: Open Wounding Event: Other Lesion Comorbid Received Chemotherapy, Received Date Acquired: 06/20/2014 History: Radiation Weeks Of Treatment: 5 Clustered Wound: No Photos Photo Uploaded By: Regan Lemming on 08/15/2014 17:06:13 Wound Measurements Length: (cm) 1 Width: (cm) 1.5 Depth: (cm) 0.1 Area: (cm) 1.178 Volume: (cm) 0.118 % Reduction in Area: -4.9% % Reduction in Volume: -5.4% Epithelialization: Small (1-33%) Tunneling: No Undermining: No Wound Description Full Thickness Without Exposed Classification: Support Structures Wound Margin: Distinct, outline attached Exudate Small Amount: Exudate Type: Serous Exudate Color: amber Foul Odor After Cleansing: Yes Due to Product Use: No Wound Bed Granulation Amount:  Large (67-100%) Exposed Structure Granulation Quality: Red Fascia Exposed: No Necrotic Amount: Small (1-33%) Fat Layer Exposed: No [name] [name], [name] ([id number]) Necrotic Quality: Adherent Slough Tendon Exposed: No Muscle Exposed: No Joint Exposed: No Bone Exposed: No Limited to Skin Breakdown Periwound Skin Texture Texture Color No Abnormalities Noted: No No Abnormalities Noted: Yes Callus: No Temperature / Pain Crepitus: No Temperature: No Abnormality Excoriation: No Fluctuance: No Friable: No Induration: Yes Localized Edema: No Rash: No Scarring: Yes Moisture No Abnormalities Noted: No Dry / Scaly: No Maceration: No Moist: Yes Wound Preparation Ulcer Cleansing: Rinsed/Irrigated with Saline Topical Anesthetic Applied: Other: 4% Lidocaine cream, Treatment Notes Wound #2 (Right Breast) 1. Cleansed with: Clean wound with Normal Saline 4. Dressing Applied: Aquacel Ag 5. Secondary Dressing Applied Bordered Foam Dressing Electronic Signature(s) Signed: 08/15/2014 5:21:09 PM By: Regan Lemming BSN, RN Entered By: Regan Lemming on 08/15/2014 08:23:18 [name] [name] ([id number]) -------------------------------------------------------------------------------- Wound Assessment Details Patient Name: [name] [name] Date of Service: 08/15/2014 8:00 AM Medical Record Number: [id number] Patient Account Number: [id number] Date of Birth/Sex: 09/12/1950 (63 y.o. Female) Treating RN: Afful, RN, BSN, Administrator, sports Primary Care Physician: PATIENT, NO Other Clinician: Referring Physician: Treating Physician/Extender: Frann Rider in Treatment: 5 Wound Status Wound Number: 3 Primary Malignant Wound Etiology: Wound Location: Right Breast - Medial Wound Status: Open Wounding Event: Other Lesion Comorbid Received Chemotherapy, Received Date Acquired: 06/20/2014 History: Radiation Weeks Of Treatment: 5 Clustered Wound: No Photos Photo Uploaded By: Regan Lemming on 08/15/2014  17:06:13 Wound Measurements Length: (cm) 0.2 Width: (cm) 0.4 Depth: (cm) 0.1 Area: (cm) 0.063 Volume: (cm) 0.006 % Reduction in Area: 59.9% % Reduction in Volume: 62.5% Epithelialization: Small (1-33%) Tunneling: No Undermining: No Wound Description Full Thickness Without Exposed Classification: Support Structures Wound Margin: Distinct, outline attached Exudate Small Amount: Exudate Type: Serosanguineous Exudate Color: red, brown Foul Odor After Cleansing: No Wound Bed Granulation Amount: Large (67-100%) Exposed Structure Granulation Quality: Red Fascia Exposed: No Necrotic Amount: None Present (0%) Fat Layer Exposed: No [name] [name], [name] ([id number]) Tendon Exposed: No Muscle Exposed: No Joint Exposed: No Bone Exposed: No Limited to Skin Breakdown Periwound Skin Texture Texture Color No Abnormalities Noted: No No Abnormalities Noted: Yes Callus: No Temperature / Pain Crepitus: No Temperature: No Abnormality Excoriation: No Fluctuance: No Friable: No Induration: Yes Localized Edema: No Rash: No Scarring: Yes Moisture  No Abnormalities Noted: Yes Wound Preparation Ulcer Cleansing: Rinsed/Irrigated with Saline Topical Anesthetic Applied: Other: 4% lidocaine Cream, Treatment Notes Wound #3 (Right, Medial Breast) 1. Cleansed with: Clean wound with Normal Saline 4. Dressing Applied: Aquacel Ag 5. Secondary Dressing Applied Bordered Foam Dressing Electronic Signature(s) Signed: 08/15/2014 5:21:09 PM By: Regan Lemming BSN, RN Entered By: Regan Lemming on 08/15/2014 08:23:34 [name] [name] ([id number]) -------------------------------------------------------------------------------- Vitals Details Patient Name: [name] [name] Date of Service: 08/15/2014 8:00 AM Medical Record Number: [id number] Patient Account Number: [id number] Date of Birth/Sex: 11/09/1950 (63 y.o. Female) Treating RN: Afful, RN, BSN, Administrator, sports Primary Care Physician: PATIENT, NO Other  Clinician: Referring Physician: Treating Physician/Extender: Frann Rider in Treatment: 5 Vital Signs Time Taken: 08:15 Temperature (F): 98.4 Height (in): 68 Pulse (bpm): 90 Weight (lbs): 180.5 Respiratory Rate (breaths/min): 16 Body Mass Index (BMI): 27.4 Blood Pressure (mmHg): 111/65 Reference Range: 80 - 120 mg / dl Electronic Signature(s) Signed: 08/15/2014 5:21:09 PM By: Regan Lemming BSN, RN Entered By: Regan Lemming on 08/15/2014 08:16:10

## 2014-08-16 NOTE — Progress Notes (Signed)
[name] [name], [name] [name] ([id number]) Visit Report for 08/15/2014 Chief Complaint Document Details Patient Name: [name] [name], [name] [name] Date of Service: 08/15/2014 8:00 AM Medical Record Patient Account Number: [id number] Number: Afful, RN, BSN, Treating RN: 11-11-1950 [phone] y.o. 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 07/18/2014 -- since last week the patient has had no complaints and is on her last cycle of chemotherapy. I have reviewed 155 pages of her reports from a medical oncologist and the summary is made in the history of present illness. Electronic Signature(s) Signed: 08/15/2014 12:22:46 PM By: Christin Fudge MD, FACS Entered By: Christin Fudge on 08/15/2014 08:35:39 [name] [name] ([id number]) -------------------------------------------------------------------------------- HPI Details Patient Name: [name] [name] Date of Service: 08/15/2014 8:00 AM Medical Record Patient Account Number: [id number] Number: Afful, RN, BSN, Treating RN: 10-07-1950 (64 y.o. [name] [name] Date of Birth/Sex: Female) Other Clinician: Primary Care Physician: PATIENT, NO Treating Sherrye Puga Referring Physician: Physician/Extender: Suella Grove in Treatment: 5 History of Present Illness HPI Description: this 64 year old patient has come by herself today as a self-referral and has no documentation with her. She has had open wounds to her left chest and the right chest wall for about 2 months. She is not a diabetic and has no significant medical history except breast cancer. Her right breast cancer was  diagnosed 18 years ago and she had a mastectomy and chemotherapy. 3 years ago she was then diagnosed with a breast cancer of the left side which was advanced and could not be operated and was given chemotherapy for a year and then followed by radiation. Her last radiation was over a year ago. She has not had any biopsy done recently from the ulcerated area but she says there have been other biopsies done from the chest wall and these reports are not available at the present time. She was told to keep the wounds open and let them dry out but she is finding it's more and more difficult to stop soiling her clothes if she leaves wounds open. She is on chemotherapy and we will try and obtain these notes from her oncologist. She is not in pain and does not have any significant problems except the discomfort from an open wound on the chest wall. 07/18/2014 This is a summary of [name] [name] date of birth 04/16/1950. These are obtained by reviewing 151 pages of medical records which were obtained from the [facility] 64 year old patient who had a history of stage I right breast cancer status post modified radical mastectomy on 06/12/1996. 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 She received 4 cycles of AC at [facility]. In January 2014 she presented with left breast swelling and apparently cellulitis. Breast biopsy done revealed lobular carcinoma which was ER and PR positive and HER-2/neu negative. On PET scan and she was shown to have multiple areas of disease left breast, axilla and supraclavicular areas. She received 6 cycles of chemotherapy and then received Femara because she was not a surgical candidate. PET scan also revealed disease in the left lateral breast and new cervical lymph nodes at level II. The patient was  then sent for radiation therapy and received this over a month. In January 2016 she had a PET CT scan done which  was compatible with marked progression of disease with multifocal soft tissue masses in the chest wall left greater than right. She is currently receiving Xeloda and is noted to have disease in the chest wall which is widely present. Her most recent lab work from 07/02/2014 shows that her CMP is within normal limits with a serum albumin of 4.4. The previous CBC was within normal limits with a WBC count of 4.3 hemoglobin of 14 hematocrit of 40.9 and platelets of 202. 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 We will continue with palliative care and make her comfortable as much as possible as far as her wound care goes. Present time there is no odor but if this does occur we would use carbon- based products to mask the odor. [name], [name] ([id number]) Notes were also reviewed from her surgeon Dr. Hervey Ard --closure on 05/27/2014 for breast biopsy in view of the fact that she had extensive disease on her chest wall. Biopsies are taken from the nodular metastatic disease overlying the sternum and nodules chosen for biopsy. Punch biopsies were taken and the pathology on this confirmed that this was consistent with carcinoma 08/15/2014 -- she is started on a new course of injectable chemotherapy and this is 2 weeks on and 2 weeks off. She has no new symptoms no bleeding from the wounds nor is there any odor. She is doing fine otherwise. Electronic Signature(s) Signed: 08/15/2014 12:22:46 PM By: Christin Fudge MD, FACS Entered By: Christin Fudge on 08/15/2014 08:36:15 [name] [name] ([id number]) -------------------------------------------------------------------------------- Physical Exam Details Patient Name: [name] [name] Date of Service: 08/15/2014 8:00 AM Medical Record Patient  Account Number: [id number] Number: Afful, RN, BSN, Treating RN: [birth year] [phone] y.o. [name] [name] Date of Birth/Sex: Female) Other Clinician: Primary Care Physician: PATIENT, NO Treating Giannis Corpuz Referring Physician: Physician/Extender: Suella Grove in Treatment: 5 Constitutional . Pulse regular. Respirations normal and unlabored. Afebrile. . Eyes Nonicteric. Reactive to light. Ears, Nose, Mouth, and Throat Lips, teeth, and gums WNL.Marland Kitchen Moist mucosa without lesions . Neck supple and nontender. No palpable supraclavicular or cervical adenopathy. Normal sized without goiter. Respiratory WNL. No retractions.. Chest contracted left breast with carcinomatosis. Ulcerated areas are clean with no bleeding or odor.. hardened necrotic area with no tenderness nor is there any cellulitis or fungal patient.. Lymphatic heart palpable nodes in the left axilla.. Psychiatric Judgement and insight Intact.. No evidence of depression, anxiety, or agitation.. Electronic Signature(s) Signed: 08/15/2014 12:22:46 PM By: Christin Fudge MD, FACS Entered By: Christin Fudge on 08/15/2014 08:37:29 [name] [name] ([id number]) -------------------------------------------------------------------------------- Physician Orders Details Patient Name: [name] [name] Date of Service: 08/15/2014 8:00 AM Medical Record Number: [id number] Patient Account Number: [id number] Date of Birth/Sex: [birth year] (63 y.o. Female) Treating RN: Montey Hora Primary Care Physician: PATIENT, NO Other Clinician: Referring Physician: Treating Physician/Extender: Frann Rider in Treatment: 5 Verbal / Phone Orders: Yes Clinician: Montey Hora Read Back and Verified: Yes Diagnosis Coding Wound Cleansing Wound #1 Left Chest o Clean wound with Normal Saline. Wound #2 Right Breast o Clean wound with Normal Saline. Wound #3 Right,Medial Breast o Clean wound with Normal Saline. Anesthetic Wound #1 Left Chest o  Topical Lidocaine 4%  cream applied to wound bed prior to debridement Wound #2 Right Breast o Topical Lidocaine 4% cream applied to wound bed prior to debridement Wound #3 Right,Medial Breast o Topical Lidocaine 4% cream applied to wound bed prior to debridement Skin Barriers/Peri-Wound Care Wound #1 Left Chest o Skin Prep Wound #2 Right Breast o Skin Prep Wound #3 Right,Medial Breast o Skin Prep Primary Wound Dressing Wound #1 Left Chest o Aquacel Ag o Other: - carboflex if needed for odor Wound #2 Right Breast o Aquacel Ag [name] [name] ([id number]) o Other: - carboflex if needed for odor Wound #3 Right,Medial Breast o Aquacel Ag o Other: - carboflex if needed for odor Secondary Dressing Wound #1 Left Chest o Boardered Foam Dressing Wound #2 Right Breast o Boardered Foam Dressing Wound #3 Right,Medial Breast o Boardered Foam Dressing Dressing Change Frequency Wound #1 Left Chest o Change dressing every other day. - or more if needed Wound #2 Right Breast o Change dressing every other day. - or more if needed Wound #3 Right,Medial Breast o Change dressing every other day. - or more if needed Follow-up Appointments Wound #1 Left Chest o Return Appointment in 1 month Wound #2 Right Breast o Return Appointment in 1 month Wound #3 Right,Medial Breast o Return Appointment in 1 month Electronic Signature(s) Signed: 08/15/2014 12:22:46 PM By: Christin Fudge MD, FACS Signed: 08/15/2014 5:45:27 PM By: Montey Hora Entered By: Montey Hora on 08/15/2014 08:36:38 [name] [name] ([id number]) -------------------------------------------------------------------------------- Problem List Details Patient Name: [name] [name] Date of Service: 08/15/2014 8:00 AM Medical Record Patient Account Number: [id number] Number: Afful, RN, BSN, Treating RN: 1950/12/19 (64 y.o. [name] [name] Date of Birth/Sex: Female) Other Clinician: Primary  Care Physician: PATIENT, NO Treating Mikolaj Woolstenhulme Referring Physician: Physician/Extender: Suella Grove in Treatment: 5 Active Problems ICD-10 Encounter Code Description Active Date Diagnosis C50.812 Malignant neoplasm of overlapping sites of left female 07/11/2014 Yes breast S21.001A Unspecified open wound of right breast, initial encounter 07/11/2014 Yes C50.412 Malignant neoplasm of upper-outer quadrant of left female 07/11/2014 Yes breast S21.002A Unspecified open wound of left breast, initial encounter 07/11/2014 Yes Inactive Problems Resolved Problems Electronic Signature(s) Signed: 08/15/2014 12:22:46 PM By: Christin Fudge MD, FACS Entered By: Christin Fudge on 08/15/2014 08:35:31 [name] [name] ([id number]) -------------------------------------------------------------------------------- Progress Note Details Patient Name: [name] [name] Date of Service: 08/15/2014 8:00 AM Medical Record Patient Account Number: [id number] Number: Afful, RN, BSN, Treating RN: [birth year] (64 y.o. 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 07/18/2014 -- since last week the patient has had no complaints and is on her last cycle of chemotherapy. I have reviewed 155 pages of her reports from a medical oncologist and the summary is made in the history of present illness. History of Present Illness (HPI) this 64 year old patient has come by herself today as a self-referral and has no documentation with her. She has had open wounds to her left chest and the right chest wall for about 2  months. She is not a diabetic and has no significant medical history except breast cancer. Her right breast cancer was diagnosed 18 years ago and she had a mastectomy and chemotherapy. 3  years ago she was then diagnosed with a breast cancer of the left side which was advanced and could not be operated and was given chemotherapy for a year and then followed by radiation. Her last radiation was over a year ago. She has not had any biopsy done recently from the ulcerated area but she says there have been other biopsies done from the chest wall and these reports are not available at the present time. She was told to keep the wounds open and let them dry out but she is finding it's more and more difficult to stop soiling her clothes if she leaves wounds open. She is on chemotherapy and we will try and obtain these notes from her oncologist. She is not in pain and does not have any significant problems except the discomfort from an open wound on the chest wall. 07/18/2014 This is a summary of [name] [name] date of birth May 20, 1950. These are obtained by reviewing 151 pages of medical records which were obtained from the [facility] 64 year old patient who had a history of stage I right breast cancer status post modified radical mastectomy on 06/12/1996. 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 She received 4 cycles of AC at [facility]. In January 2014 she presented with left breast swelling and apparently cellulitis. Breast biopsy done revealed lobular carcinoma which was ER and PR positive and HER-2/neu negative. On PET scan and she was shown to have multiple areas of disease left breast, axilla and supraclavicular areas. She received 6 cycles of chemotherapy and then received Femara because she was not a surgical candidate. PET scan also revealed disease in the left lateral breast and new cervical lymph nodes at level II. The patient  was then sent for radiation therapy and received this over a month. In January 2016 she had a PET CT scan done which was compatible with marked progression of disease with multifocal soft tissue masses in the chest wall left greater than right. [name] [name], [name] [name]. ([id number]) She is currently receiving Xeloda and is noted to have disease in the chest wall which is widely present. Her most recent lab work from 07/02/2014 shows that her CMP is within normal limits with a serum albumin of 4.4. The previous CBC was within normal limits with a WBC count of 4.3 hemoglobin of 14 hematocrit of 40.9 and platelets of 202. 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 We will continue with palliative care and make her comfortable as much as possible as far as her wound care goes. Present time there is no odor but if this does occur we would use carbon- based products to mask the odor. Notes were also reviewed from her surgeon Dr. Hervey Ard --closure on 05/27/2014 for breast biopsy in view of the fact that she had extensive disease on her chest wall. Biopsies are taken from the nodular metastatic disease overlying the sternum and nodules chosen for biopsy. Punch biopsies were taken and the pathology on this confirmed that this was consistent with carcinoma 08/15/2014 -- she is started on a new course of injectable chemotherapy and this is 2 weeks on and 2 weeks off. She has no new symptoms no bleeding from the wounds nor is there any odor. She is doing fine  otherwise. Objective Constitutional Pulse regular. Respirations normal and unlabored. Afebrile. Vitals Time Taken: 8:15 AM, Height: 68 in, Weight: 180.5 lbs, BMI: 27.4, Temperature: 98.4 F, Pulse: 90 bpm, Respiratory Rate: 16 breaths/min, Blood  Pressure: 111/65 mmHg. Eyes Nonicteric. Reactive to light. Ears, Nose, Mouth, and Throat Lips, teeth, and gums WNL.Marland Kitchen Moist mucosa without lesions . Neck supple and nontender. No palpable supraclavicular or cervical adenopathy. Normal sized without goiter. Respiratory WNL. No retractions.. Chest contracted left breast with carcinomatosis. Ulcerated areas are clean with no bleeding or odor.. hardened necrotic area with no tenderness nor is there any cellulitis or fungal patient.. Lymphatic [name] [name], [name] ([id number]) heart palpable nodes in the left axilla.. Psychiatric Judgement and insight Intact.. No evidence of depression, anxiety, or agitation.. Integumentary (Hair, Skin) Wound #1 status is Open. Original cause of wound was Other Lesion. The wound is located on the Left Chest. The wound measures 4cm length x 6.8cm width x 0.2cm depth; 21.363cm^2 area and 4.273cm^3 volume. The wound is limited to skin breakdown. There is no tunneling noted. There is a small amount of serosanguineous drainage noted. The wound margin is distinct with the outline attached to the wound base. There is medium (34-66%) red granulation within the wound bed. There is a medium (34-66%) amount of necrotic tissue within the wound bed including Adherent Slough. The periwound skin appearance had no abnormalities noted for moisture. The periwound skin appearance had no abnormalities noted for color. The periwound skin appearance exhibited: Induration, Scarring. The periwound skin appearance did not exhibit: Callus, Crepitus, Excoriation, Fluctuance, Friable, Localized Edema, Rash. Periwound temperature was noted as No Abnormality. Wound #2 status is Open. Original cause of wound was Other Lesion. The wound is located on the Right Breast. The wound measures 1cm length x 1.5cm width x 0.1cm depth; 1.178cm^2 area and 0.118cm^3 volume. The wound is limited to skin breakdown. There is no tunneling or undermining noted.  There is a small amount of serous drainage noted. The wound margin is distinct with the outline attached to the wound base. There is large (67-100%) red granulation within the wound bed. There is a small (1-33%) amount of necrotic tissue within the wound bed including Adherent Slough. The periwound skin appearance had no abnormalities noted for color. The periwound skin appearance exhibited: Induration, Scarring, Moist. The periwound skin appearance did not exhibit: Callus, Crepitus, Excoriation, Fluctuance, Friable, Localized Edema, Rash, Dry/Scaly, Maceration. Periwound temperature was noted as No Abnormality. Wound #3 status is Open. Original cause of wound was Other Lesion. The wound is located on the Right,Medial Breast. The wound measures 0.2cm length x 0.4cm width x 0.1cm depth; 0.063cm^2 area and 0.006cm^3 volume. The wound is limited to skin breakdown. There is no tunneling or undermining noted. There is a small amount of serosanguineous drainage noted. The wound margin is distinct with the outline attached to the wound base. There is large (67-100%) red granulation within the wound bed. There is no necrotic tissue within the wound bed. The periwound skin appearance had no abnormalities noted for moisture. The periwound skin appearance had no abnormalities noted for color. The periwound skin appearance exhibited: Induration, Scarring. The periwound skin appearance did not exhibit: Callus, Crepitus, Excoriation, Fluctuance, Friable, Localized Edema, Rash. Periwound temperature was noted as No Abnormality. Assessment Active Problems ICD-10 C50.812 - Malignant neoplasm of overlapping sites of left female breast S21.001A - Unspecified open wound of right breast, initial encounter [name] [name], [name] [name]. ([id number]) C50.412 - Malignant neoplasm of upper-outer quadrant of left female breast S21.002A -  Unspecified open wound of left breast, initial encounter She is doing fine from the palliative  wound care angle and her wounds are clean and not fungating. We will continue all supportive care with silver alginate and if required Carboflex. We will make sure she has enough of dressing material and I have asked her to come as often as she can. Plan Wound Cleansing: Wound #1 Left Chest: Clean wound with Normal Saline. Wound #2 Right Breast: Clean wound with Normal Saline. Wound #3 Right,Medial Breast: Clean wound with Normal Saline. Anesthetic: Wound #1 Left Chest: Topical Lidocaine 4% cream applied to wound bed prior to debridement Wound #2 Right Breast: Topical Lidocaine 4% cream applied to wound bed prior to debridement Wound #3 Right,Medial Breast: Topical Lidocaine 4% cream applied to wound bed prior to debridement Skin Barriers/Peri-Wound Care: Wound #1 Left Chest: Skin Prep Wound #2 Right Breast: Skin Prep Wound #3 Right,Medial Breast: Skin Prep Primary Wound Dressing: Wound #1 Left Chest: Aquacel Ag Other: - carboflex if needed for odor Wound #2 Right Breast: Aquacel Ag Other: - carboflex if needed for odor Wound #3 Right,Medial Breast: Aquacel Ag Other: - carboflex if needed for odor Secondary Dressing: Wound #1 Left Chest: Boardered Foam Dressing Wound #2 Right Breast: [name] [name], [name] [name] ([id number]) Boardered Foam Dressing Wound #3 Right,Medial Breast: Boardered Foam Dressing Dressing Change Frequency: Wound #1 Left Chest: Change dressing every other day. - or more if needed Wound #2 Right Breast: Change dressing every other day. - or more if needed Wound #3 Right,Medial Breast: Change dressing every other day. - or more if needed Follow-up Appointments: Wound #1 Left Chest: Return Appointment in 1 month Wound #2 Right Breast: Return Appointment in 1 month Wound #3 Right,Medial Breast: Return Appointment in 1 month She is doing fine from the palliative wound care angle and her wounds are clean and not fungating. We will continue all supportive  care with silver alginate and if required Carboflex. We will make sure she has enough of dressing material and I have asked her to come as often as she can. Electronic Signature(s) Signed: 08/15/2014 12:22:46 PM By: Christin Fudge MD, FACS Entered By: Christin Fudge on 08/15/2014 08:46:59

## 2014-08-20 ENCOUNTER — Inpatient Hospital Stay: Payer: BLUE CROSS/BLUE SHIELD

## 2014-08-20 ENCOUNTER — Other Ambulatory Visit: Payer: Self-pay | Admitting: *Deleted

## 2014-08-20 DIAGNOSIS — C50912 Malignant neoplasm of unspecified site of left female breast: Secondary | ICD-10-CM

## 2014-08-20 DIAGNOSIS — C792 Secondary malignant neoplasm of skin: Principal | ICD-10-CM

## 2014-08-20 LAB — CBC WITH DIFFERENTIAL/PLATELET
Basophils Absolute: 0 10*3/uL (ref 0–0.1)
Basophils Relative: 1 %
Eosinophils Absolute: 0 10*3/uL (ref 0–0.7)
Eosinophils Relative: 0 %
HEMATOCRIT: 37.9 % (ref 35.0–47.0)
Hemoglobin: 12.5 g/dL (ref 12.0–16.0)
Lymphs Abs: 1.3 10*3/uL (ref 1.0–3.6)
MCH: 31.2 pg (ref 26.0–34.0)
MCHC: 32.9 g/dL (ref 32.0–36.0)
MCV: 94.6 fL (ref 80.0–100.0)
Monocytes Absolute: 0.3 10*3/uL (ref 0.2–0.9)
Monocytes Relative: 17 %
Neutro Abs: 0.2 10*3/uL — ABNORMAL LOW (ref 1.4–6.5)
Neutrophils Relative %: 12 %
Platelets: 207 10*3/uL (ref 150–440)
RBC: 4 MIL/uL (ref 3.80–5.20)
RDW: 18.4 % — AB (ref 11.5–14.5)
WBC: 1.9 10*3/uL — ABNORMAL LOW (ref 3.6–11.0)

## 2014-08-27 ENCOUNTER — Inpatient Hospital Stay (HOSPITAL_BASED_OUTPATIENT_CLINIC_OR_DEPARTMENT_OTHER): Payer: BLUE CROSS/BLUE SHIELD | Admitting: Hematology and Oncology

## 2014-08-27 ENCOUNTER — Inpatient Hospital Stay: Payer: BLUE CROSS/BLUE SHIELD

## 2014-08-27 ENCOUNTER — Other Ambulatory Visit: Payer: Self-pay

## 2014-08-27 ENCOUNTER — Other Ambulatory Visit: Payer: Self-pay | Admitting: *Deleted

## 2014-08-27 VITALS — BP 139/83 | HR 99 | Temp 96.7°F | Ht 68.0 in | Wt 179.2 lb

## 2014-08-27 VITALS — BP 124/71 | HR 86

## 2014-08-27 DIAGNOSIS — C792 Secondary malignant neoplasm of skin: Principal | ICD-10-CM

## 2014-08-27 DIAGNOSIS — C50912 Malignant neoplasm of unspecified site of left female breast: Secondary | ICD-10-CM | POA: Diagnosis not present

## 2014-08-27 DIAGNOSIS — Z006 Encounter for examination for normal comparison and control in clinical research program: Secondary | ICD-10-CM

## 2014-08-27 LAB — CBC WITH DIFFERENTIAL/PLATELET
Basophils Absolute: 0 10*3/uL (ref 0–0.1)
Basophils Relative: 1 %
Eosinophils Absolute: 0 10*3/uL (ref 0–0.7)
Eosinophils Relative: 0 %
HCT: 40.7 % (ref 35.0–47.0)
Hemoglobin: 13.4 g/dL (ref 12.0–16.0)
Lymphocytes Relative: 45 %
Lymphs Abs: 1.5 10*3/uL (ref 1.0–3.6)
MCH: 31.1 pg (ref 26.0–34.0)
MCHC: 32.8 g/dL (ref 32.0–36.0)
MCV: 94.7 fL (ref 80.0–100.0)
Monocytes Absolute: 0.8 10*3/uL (ref 0.2–0.9)
Monocytes Relative: 24 %
Neutro Abs: 1 10*3/uL — ABNORMAL LOW (ref 1.4–6.5)
Neutrophils Relative %: 30 %
Platelets: 224 10*3/uL (ref 150–440)
RBC: 4.3 MIL/uL (ref 3.80–5.20)
RDW: 18.4 % — ABNORMAL HIGH (ref 11.5–14.5)
WBC: 3.3 10*3/uL — ABNORMAL LOW (ref 3.6–11.0)

## 2014-08-27 LAB — COMPREHENSIVE METABOLIC PANEL
ALT: 36 U/L (ref 14–54)
AST: 27 U/L (ref 15–41)
Albumin: 4.1 g/dL (ref 3.5–5.0)
Alkaline Phosphatase: 73 U/L (ref 38–126)
Anion gap: 7 (ref 5–15)
BUN: 15 mg/dL (ref 6–20)
CO2: 25 mmol/L (ref 22–32)
Calcium: 9.1 mg/dL (ref 8.9–10.3)
Chloride: 103 mmol/L (ref 101–111)
Creatinine, Ser: 1.07 mg/dL — ABNORMAL HIGH (ref 0.44–1.00)
GFR calc Af Amer: >60 mL/min (ref >60)
GFR calc non Af Amer: 55 mL/min — ABNORMAL LOW (ref >60)
Glucose, Bld: 102 mg/dL — ABNORMAL HIGH (ref 65–99)
Potassium: 3.5 mmol/L (ref 3.5–5.1)
Sodium: 135 mmol/L (ref 135–145)
Total Bilirubin: 0.5 mg/dL (ref 0.3–1.2)
Total Protein: 7.2 g/dL (ref 6.5–8.1)

## 2014-08-27 MED ORDER — INV-ERIBULIN CHEMO INJECTION 1 MG/2ML RU011201I ACCRU
1.4000 mg/m2 | Freq: Once | INTRAVENOUS | Status: AC
  Administered 2014-08-27: 2.7 mg via INTRAVENOUS
  Filled 2014-08-27: qty 5.4

## 2014-08-27 MED ORDER — SODIUM CHLORIDE 0.9 % IV SOLN
Freq: Once | INTRAVENOUS | Status: AC
  Administered 2014-08-27: 16:00:00 via INTRAVENOUS
  Filled 2014-08-27: qty 4

## 2014-08-27 MED ORDER — SODIUM CHLORIDE 0.9 % IV SOLN
Freq: Once | INTRAVENOUS | Status: AC
  Administered 2014-08-27: 16:00:00 via INTRAVENOUS
  Filled 2014-08-27: qty 1000

## 2014-08-27 MED ORDER — SODIUM CHLORIDE 0.9 % IJ SOLN
10.0000 mL | INTRAMUSCULAR | Status: DC | PRN
  Administered 2014-08-27: 10 mL
  Filled 2014-08-27: qty 10

## 2014-08-27 MED ORDER — HEPARIN SOD (PORK) LOCK FLUSH 100 UNIT/ML IV SOLN
500.0000 [IU] | Freq: Once | INTRAVENOUS | Status: AC | PRN
  Administered 2014-08-27: 500 [IU]

## 2014-08-27 NOTE — Progress Notes (Signed)
Pt here today for follow up regarding breast cancer; offers no complaints today

## 2014-08-27 NOTE — Progress Notes (Signed)
3:15pm Met with [name] [name] in MD office to discuss her treatment this afternoon. She has already had her labs drawn and has been over to the main hospital for an ECG. CBC shows her [name] is 1000 today, and per section 8.2.1 of the protocol, this is barely high enough to receive her Eribulin treatment. Her ECG results were reviewed by [name] and abnormalities were deemed not to be clinically significant. Cutaneous lesions were assessed, measured and photographed by [name] and one of the lesions has completely healed and the others are healing as well. Patient has completed her [employer] phone survey this week on 08/24/14 and this was reviewed with the patient and [name]. She reported mild constipation this past week, but only had one episode of a "hard" stool. Reports appetite has decreased, but she is still eating at least 2 meals per day with no significant weight loss. Continues to report moderate fatigue, which does not interfere with her ability to perform ADLs. Hair is now thinning, but no complete hair loss. 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 Reports insomnia, waking up 1-2 times at night and remaining awake for 30-60 minutes. Reports having one loose stool and that same day she had a total of 2 bowel movements, 1 more than baseline. Reports her pain is about the same and she still takes Tramadol 3 times daily with good control for this. 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 [name] denies any additional adverse effects from her first cycle of chemotherapy. VS are stable. Will proceed with C2D1 Eribulin infusion as scheduled. Patient to return to clinic in one week for C2D8 Eribulin if her Leonard result allows.

## 2014-08-29 ENCOUNTER — Encounter: Payer: Self-pay | Admitting: Hematology and Oncology

## 2014-08-29 NOTE — Progress Notes (Signed)
[facility] day:  08/27/2014  Chief Complaint: [name] [name] is an 64 y.o. female with recurrent breast cancer who is seen for assessment prior to day 1 of cycle #2 Halaven  HPI:  The patient was last seen in the medical onoclogy clinic on 08/13/2014. At that time, she received day 8 Halaven.  She tolerated her chemotherapy well.  She returned the following week for nadir counts.  Counts on 08/20/2014 included a hematocrit of 37.9, hemoglobin 12.5, platelets 207,000, WBC 1900, and ANC 200.  She states that she "can't tell a difference" in how she feels with chemotherapy.  Symptomatically, she states that her appetite was down last week.  Although she wasn't hungry, she ate and has maintained her weight.  She comments that her hair is "thinning a lot".  She has some insomnia.  She had an isolated loose stool.  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  Regarding her chest lesions, one small lesion on her right anterior chest has resolved.  The other right anterior chest wall lesion is improving.  She is unaware of the status of the larger left sided lesion.  She is going to wound care once a month.  She feels her chest wall lesions are drying up.  Past Medical History  Diagnosis Date  . Personal history of malignant neoplasm of breast   . Breast cancer   . Anemia   . Blood transfusion without reported diagnosis   . Emphysema of lung     Past Surgical History  Procedure Laterality Date  . Oophrectomy  1980  . Abdominal hysterectomy  1995  . Breast surgery Right 1996    mastectomy  . Breast biopsy Left 2014  . Breast biopsy Left 01-21-14   Family History  Problem Relation Age of Onset  . Cancer Father   . Heart disease Sister   . Diabetes Sister   . Heart disease Brother   . Cancer Paternal Grandmother     breast   Social History:  reports that she has been smoking Cigarettes.  She has a 25 pack-year smoking history. She has  never used smokeless tobacco. She reports that she does not drink alcohol or use illicit drugs.  The patient is accompanied by the clinical trial nurse, [name] [name].  Allergies:  Allergies  Allergen Reactions  . Sulfa Antibiotics Nausea And Vomiting    headache   Current Medications: Current Outpatient Prescriptions  Medication Sig Dispense Refill  . Calcium Carbonate-Vitamin D (CALCIUM + D PO) Take 2 tablets by mouth daily.    . traMADol (ULTRAM) 50 MG tablet Take 50 mg by mouth every 8 (eight) hours as needed.    Marland Kitchen KLOR-CON 10 10 MEQ tablet   0   No current facility-administered medications for this visit.   Facility-Administered Medications Ordered in Other Visits  Medication Dose Route Frequency Provider Last Rate Last Dose  . sodium chloride 0.9 % injection 10 mL  10 mL Intracatheter PRN Lequita Asal, MD      . sodium chloride 0.9 % injection 10 mL  10 mL Intracatheter PRN Lequita Asal, MD   10 mL at 08/27/14 1400   Review of Systems:  GENERAL:  Feels good.  Active.  No fevers or sweats.  Weight stable. PERFORMANCE STATUS (ECOG):  1 HEENT:  No visual changes, runny nose, sore throat, mouth sores or tenderness. Lungs: No shortness of breath or cough.  No hemoptysis. Cardiac:  No  chest pain, palpitations, orthopnea, or PND. GI:  One loose stool last week.  Appetite down last week.  Eating.  No nausea, vomiting, diarrhea, constipation, melena or hematochezia. GU:  No urgency, frequency, dysuria, or hematuria. Musculoskeletal:  No back pain.  No joint pain.  No muscle tenderness. Extremities:  No pain or swelling. Skin:  Chest wall drying up.  Right anterior chest wall lesions improved (see HPI).  Hair thinning. Neuro:  No headache, numbness or weakness, balance or coordination issues.  No change in baseline mild neuropathy prior to initiation of treatment. Endocrine:  No diabetes, thyroid issues, hot flashes or night sweats. Psych:  No mood changes, depression or  anxiety. Pain:  Chronic mild pain on Tramadol. Review of systems:  All other systems reviewed and found to be negative.   Physical Exam: Blood pressure 139/83, pulse 99, temperature 96.7 F (35.9 C), temperature source Tympanic, height 5' 8"  (1.727 m), weight 179 lb 3.7 oz (81.3 kg).  GENERAL:  Well developed, well nourished, sitting comfortably in the exam room in no acute distress. MENTAL STATUS:  Alert and oriented to person, place and time. HEAD:  Short brown hair with slight graying.  Normocephalic, atraumatic, face symmetric, no Cushingoid features. EYES:  Brown eyes.  Pupils equal round and reactive to light and accomodation.  No conjunctivitis or scleral icterus. ENT:  Oropharynx clear without lesion.  Tongue normal. Mucous membranes moist.  RESPIRATORY:  Clear to auscultation without rales, wheezes or rhonchi. CARDIOVASCULAR:  Regular rate and rhythm without murmur, rub or gallop. BREAST:  Right chest wall lesions improved (central lesion healed with new skin, 2nd lesion < 1 cm).  Central upper sternal area flatter and less discrete (3 cm in greatest dimension).  Skin drying and flattening on left chest wall.  Left lateral denuded area in slightly irregular rectangular shape measuring 7 x 3-4 cm). ABDOMEN:  Soft, non-tender, with active bowel sounds, and no hepatosplenomegaly.  No masses. SKIN:  Cutaneous breast cancer noted in Breast section.  No other lesions. EXTREMITIES: No edema, no skin discoloration or tenderness.  No palpable cords. LYMPH NODES: Left axillary ridge stable.  No palpable cervical, supraclavicular, axillary or inguinal adenopathy  NEUROLOGICAL: Unremarkable. PSYCH:  Appropriate.   Clinical Support on 08/27/2014  Component Date Value Ref Range Status  . WBC 08/27/2014 3.3* 3.6 - 11.0 K/uL Final   A-LINE DRAW  . RBC 08/27/2014 4.30  3.80 - 5.20 MIL/uL Final  . Hemoglobin 08/27/2014 13.4  12.0 - 16.0 g/dL Final  . HCT 08/27/2014 40.7  35.0 - 47.0 % Final  .  MCV 08/27/2014 94.7  80.0 - 100.0 fL Final  . MCH 08/27/2014 31.1  26.0 - 34.0 pg Final  . MCHC 08/27/2014 32.8  32.0 - 36.0 g/dL Final  . RDW 08/27/2014 18.4* 11.5 - 14.5 % Final  . Platelets 08/27/2014 224  150 - 440 K/uL Final  . Neutrophils Relative % 08/27/2014 30   Final  . Neutro Abs 08/27/2014 1.0* 1.4 - 6.5 K/uL Final  . Lymphocytes Relative 08/27/2014 45   Final  . Lymphs Abs 08/27/2014 1.5  1.0 - 3.6 K/uL Final  . Monocytes Relative 08/27/2014 24   Final  . Monocytes Absolute 08/27/2014 0.8  0.2 - 0.9 K/uL Final  . Eosinophils Relative 08/27/2014 0   Final  . Eosinophils Absolute 08/27/2014 0.0  0 - 0.7 K/uL Final  . Basophils Relative 08/27/2014 1   Final  . Basophils Absolute 08/27/2014 0.0  0 - 0.1 K/uL Final  .  Sodium 08/27/2014 135  135 - 145 mmol/L Final  . Potassium 08/27/2014 3.5  3.5 - 5.1 mmol/L Final  . Chloride 08/27/2014 103  101 - 111 mmol/L Final  . CO2 08/27/2014 25  22 - 32 mmol/L Final  . Glucose, Bld 08/27/2014 102* 65 - 99 mg/dL Final  . BUN 08/27/2014 15  6 - 20 mg/dL Final  . Creatinine, Ser 08/27/2014 1.07* 0.44 - 1.00 mg/dL Final  . Calcium 08/27/2014 9.1  8.9 - 10.3 mg/dL Final  . Total Protein 08/27/2014 7.2  6.5 - 8.1 g/dL Final  . Albumin 08/27/2014 4.1  3.5 - 5.0 g/dL Final  . AST 08/27/2014 27  15 - 41 U/L Final  . ALT 08/27/2014 36  14 - 54 U/L Final  . Alkaline Phosphatase 08/27/2014 73  38 - 126 U/L Final  . Total Bilirubin 08/27/2014 0.5  0.3 - 1.2 mg/dL Final  . GFR calc non Af Amer 08/27/2014 55* >60 mL/min Final  . GFR calc Af Amer 08/27/2014 >60  >60 mL/min Final   Comment: (NOTE) The eGFR has been calculated using the CKD EPI equation. This calculation has not been validated in all clinical situations. eGFR's persistently <60 mL/min signify possible Chronic Kidney Disease.   . Anion gap 08/27/2014 7  5 - 15 Final    Assessment:  [name] [name] is a 64 year old African American woman with a history of stage I right breast  cancer s/p modified radical mastectomy on 06/12/1996. Pathology revealed a grade III mutifocal infiltrating ductal carcinoma (tumor size 8.5 cm with an invasive component 1.6 cm). Twenty-one lymph nodes were negative. Tumor was ER/PR negative. She received 4 cycles of AC at [facility].  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 PET scan on 05/22/2012 revealed multiple areas of disease. She received 6 cycles of Taxotere and Cytoxan (TC) from 06/06/2012 - 09/29/2012. PET scan on 09/07/2012 noted marked improvement. Post chemotherapy biopsy was positive. She was not a surgical candidate. She was placed on Femara.  PET scan on 05/03/2013 revealed a new focus along lateral left breast and a new level II cervical lymph node. She received radiation from 09/13/2013 - 11/05/2013. PET scan on 01/01/2014 revealed new nodule medial left chest wall pleural/paraspinal activity LUL. Multiple biopsies on 01/19/2014 were positive. She began daily letrozole and Ibrance on 02/12/2014. Despite treatment, new nodules formed. She received a total of 2 cycles.   PET scan on 05/09/2014 revealed marked progression of disease with multifocal masses in chest wall (left > right). She did not receive Imbrance in 04/2014. CA27.29 has increased: 22.9 on 01/08/2014, 54 on 03/05/2014, 170.8 on 05/06/2014, 250.8 on 05/23/2014, 445.8 on 06/14/2014, and 531.1 on 07/01/2104.   She received 3 cycles of Xeloda (02/12 - 07/05/2014). Chest and abdomen CT scan on 07/05/2014 revealed persistent and slightly progressive diffuse locally invasive chest wall tumor. Bone scan on 07/25/2014 revealed no evidence of metastatic disease.   She is status post 1 cycle of Halaven (began 08/06/2014) on ACCRU [id number]. Cycle #1 was notable for an ANC of 200 on day 15.  Symptomatically, she feels good.  She has had minimal side  effects due to treatment.  Exam reveals resolving right chest wall lesions, smaller superior sternal lesion, drying of the left anterior chest wall lesions, and a large denuded area in the left lateral chest wall.    Plan: 1. Labs today:  CBC with diff,  CMP, CA27.29. 2. Day 1 of cycle #2 Halaven. 3. Medical photographs. 4. RTC in 1 week for labs and day 8 of cycle #2 Halaven. 5. Discuss blood counts and associated neutropenia with cycle #1.  Re-review fever and neutropenia precautions. 6. RTC in 3 weeks for MD assessment, labs (CBC with diff, CMP, CA27.29) and cycle #3 Halaven.  Lequita Asal, MD  08/27/2014, 4:46 PM

## 2014-08-30 ENCOUNTER — Telehealth: Payer: Self-pay | Admitting: *Deleted

## 2014-08-30 NOTE — Telephone Encounter (Signed)
Received voicemail message earlier today from patient requesting call back. T/C made to [name] [name] and she reports she has been experiencing some nausea for the past 2 days. States she does not have any medication at home for this. Questioned about prescription [name] had sent over to her pharmacy prior to beginning treatment, and patient reports she never picked it up. T/C made to [facility] pharmacy and questioned whether they still have the prescription. They report that the prescription ifs still on file. Inst pharmacy to go ahead and fill the prescription. Patient called back and informed that [facility] will be filling her prescription again and she can go over/send someone over to pick it up later today. Patient states she is not vomiting yet, but feels very queasy and fears she may become sick over the weekend and need the medication.

## 2014-09-03 ENCOUNTER — Encounter: Payer: Self-pay | Admitting: *Deleted

## 2014-09-03 ENCOUNTER — Encounter: Payer: Self-pay | Admitting: Hematology and Oncology

## 2014-09-03 ENCOUNTER — Inpatient Hospital Stay: Payer: BLUE CROSS/BLUE SHIELD

## 2014-09-03 VITALS — BP 136/75 | HR 80 | Temp 97.8°F | Resp 18

## 2014-09-03 DIAGNOSIS — C50912 Malignant neoplasm of unspecified site of left female breast: Secondary | ICD-10-CM | POA: Diagnosis not present

## 2014-09-03 LAB — CBC WITH DIFFERENTIAL/PLATELET
Basophils Absolute: 0 10*3/uL (ref 0–0.1)
Basophils Relative: 0 %
Eosinophils Absolute: 0 10*3/uL (ref 0–0.7)
Eosinophils Relative: 0 %
HCT: 38.6 % (ref 35.0–47.0)
Hemoglobin: 12.8 g/dL (ref 12.0–16.0)
Lymphocytes Relative: 47 %
Lymphs Abs: 1.5 10*3/uL (ref 1.0–3.6)
MCH: 31 pg (ref 26.0–34.0)
MCHC: 33.1 g/dL (ref 32.0–36.0)
MCV: 93.5 fL (ref 80.0–100.0)
Monocytes Absolute: 0.4 10*3/uL (ref 0.2–0.9)
Monocytes Relative: 13 %
Neutro Abs: 1.3 10*3/uL — ABNORMAL LOW (ref 1.4–6.5)
Neutrophils Relative %: 40 %
Platelets: 192 10*3/uL (ref 150–440)
RBC: 4.12 MIL/uL (ref 3.80–5.20)
RDW: 17.7 % — ABNORMAL HIGH (ref 11.5–14.5)
WBC: 3.1 10*3/uL — ABNORMAL LOW (ref 3.6–11.0)

## 2014-09-03 MED ORDER — INV-ERIBULIN CHEMO INJECTION 1 MG/2ML RU011201I ACCRU
1.4000 mg/m2 | Freq: Once | INTRAVENOUS | Status: AC
  Administered 2014-09-03: 2.7 mg via INTRAVENOUS
  Filled 2014-09-03: qty 5.4

## 2014-09-03 MED ORDER — SODIUM CHLORIDE 0.9 % IV SOLN
Freq: Once | INTRAVENOUS | Status: AC
  Administered 2014-09-03: 16:00:00 via INTRAVENOUS
  Filled 2014-09-03: qty 4

## 2014-09-03 MED ORDER — SODIUM CHLORIDE 0.9 % IV SOLN
Freq: Once | INTRAVENOUS | Status: AC
  Administered 2014-09-03: 16:00:00 via INTRAVENOUS
  Filled 2014-09-03: qty 250

## 2014-09-03 MED ORDER — SODIUM CHLORIDE 0.9 % IJ SOLN
10.0000 mL | INTRAMUSCULAR | Status: DC | PRN
  Administered 2014-09-03: 10 mL
  Filled 2014-09-03: qty 10

## 2014-09-03 MED ORDER — HEPARIN SOD (PORK) LOCK FLUSH 100 UNIT/ML IV SOLN
500.0000 [IU] | Freq: Once | INTRAVENOUS | Status: AC | PRN
  Administered 2014-09-03: 500 [IU]
  Filled 2014-09-03: qty 5

## 2014-09-03 NOTE — Progress Notes (Signed)
09/03/2014 @2 :40pm Patient returns to clinic this afternoon for cycle 2, day 8 Eribulin infusion per protocol. Patient reports she has had a rough week, and is feeling like she has no energy. CBC collected by Infusion RN and PRO-CTCAE symptoms reviewed by research nurse and [name]. Ms. Frenz continues to report decreased appetite, but states she is still eating regularly - grade 1 anorexia that is definitely related to study drug. Also reports feeling quite a bit more fatigue since her last infusion and she rates her level of fatigue as "severe" on the PRO survey; however she was able to continue to manage her ADLs. She does report periods of having no energy at all, and rest did not help with the fatigue, therefore it is a grade 2 and definitely related. Reports increased insomnia since last week stating she occasionally has difficulty sleeping more than 2-3 hours at a time, then waking up for 1-2 hours before being able to fall back to sleep. She does not use a sleeping aid, it does not occur every night, and she reported insomnia prior to beginning the Eribulin; therefore it is a grade 1 and unrelated. She has experienced quite a bit more hair loss, but not complete hair loss yet; grade 1 and definitely related. Reports experiencing a little nausea last week, but never vomited and did not take prn medication for it - grade 1 and definitely related. Pt states she experienced one episode of numbness and tingling in her left hand and fingers consistent with grade 1 sensory neuropathy, which has not worsened since baseline and likely not related to the Eribulin. Ms. Beachem daughter Jenetta Downer is with her today.  3:00pm    CBC results show ANC of 1300 and platelets are 192,000 today. VS are stable. Reported all to [name] and will proceed with C2D8 Eribulin infusion as planned. Ms. Hershkowitz reports her appointment card has her coming back next week for infusion. Instructed pt that she will not have her  infusion next week - but will be off and return in 2 weeks for her next cycle of Eribulin infusions, and that her appointment is on June 7th at 8:45am. Patient wrote the appointment date/time down.

## 2014-09-10 ENCOUNTER — Ambulatory Visit: Payer: BLUE CROSS/BLUE SHIELD | Admitting: Hematology and Oncology

## 2014-09-10 ENCOUNTER — Other Ambulatory Visit: Payer: BLUE CROSS/BLUE SHIELD

## 2014-09-10 ENCOUNTER — Ambulatory Visit: Payer: BLUE CROSS/BLUE SHIELD

## 2014-09-12 ENCOUNTER — Ambulatory Visit: Payer: BLUE CROSS/BLUE SHIELD | Admitting: Surgery

## 2014-09-16 ENCOUNTER — Encounter: Payer: BLUE CROSS/BLUE SHIELD | Attending: Surgery | Admitting: Surgery

## 2014-09-16 DIAGNOSIS — C50812 Malignant neoplasm of overlapping sites of left female breast: Secondary | ICD-10-CM | POA: Diagnosis not present

## 2014-09-16 DIAGNOSIS — S21001A Unspecified open wound of right breast, initial encounter: Secondary | ICD-10-CM | POA: Diagnosis present

## 2014-09-16 DIAGNOSIS — S21002A Unspecified open wound of left breast, initial encounter: Secondary | ICD-10-CM | POA: Diagnosis present

## 2014-09-16 DIAGNOSIS — C50412 Malignant neoplasm of upper-outer quadrant of left female breast: Secondary | ICD-10-CM | POA: Insufficient documentation

## 2014-09-17 ENCOUNTER — Encounter (INDEPENDENT_AMBULATORY_CARE_PROVIDER_SITE_OTHER): Payer: Self-pay

## 2014-09-17 ENCOUNTER — Inpatient Hospital Stay: Payer: BLUE CROSS/BLUE SHIELD | Attending: Hematology and Oncology

## 2014-09-17 ENCOUNTER — Encounter: Payer: Self-pay | Admitting: Hematology and Oncology

## 2014-09-17 ENCOUNTER — Inpatient Hospital Stay: Payer: BLUE CROSS/BLUE SHIELD

## 2014-09-17 ENCOUNTER — Encounter: Payer: Self-pay | Admitting: *Deleted

## 2014-09-17 ENCOUNTER — Inpatient Hospital Stay (HOSPITAL_BASED_OUTPATIENT_CLINIC_OR_DEPARTMENT_OTHER): Payer: BLUE CROSS/BLUE SHIELD | Admitting: Hematology and Oncology

## 2014-09-17 VITALS — BP 122/82 | HR 90 | Temp 96.9°F | Resp 20 | Ht 68.0 in | Wt 176.1 lb

## 2014-09-17 DIAGNOSIS — C7981 Secondary malignant neoplasm of breast: Secondary | ICD-10-CM | POA: Diagnosis not present

## 2014-09-17 DIAGNOSIS — R5383 Other fatigue: Secondary | ICD-10-CM | POA: Insufficient documentation

## 2014-09-17 DIAGNOSIS — Z170 Estrogen receptor positive status [ER+]: Secondary | ICD-10-CM | POA: Diagnosis not present

## 2014-09-17 DIAGNOSIS — C50912 Malignant neoplasm of unspecified site of left female breast: Secondary | ICD-10-CM

## 2014-09-17 DIAGNOSIS — Z5111 Encounter for antineoplastic chemotherapy: Secondary | ICD-10-CM | POA: Diagnosis not present

## 2014-09-17 DIAGNOSIS — Z79899 Other long term (current) drug therapy: Secondary | ICD-10-CM | POA: Diagnosis not present

## 2014-09-17 DIAGNOSIS — R197 Diarrhea, unspecified: Secondary | ICD-10-CM | POA: Diagnosis not present

## 2014-09-17 DIAGNOSIS — Z171 Estrogen receptor negative status [ER-]: Secondary | ICD-10-CM | POA: Insufficient documentation

## 2014-09-17 DIAGNOSIS — Z853 Personal history of malignant neoplasm of breast: Secondary | ICD-10-CM | POA: Diagnosis not present

## 2014-09-17 DIAGNOSIS — F17210 Nicotine dependence, cigarettes, uncomplicated: Secondary | ICD-10-CM | POA: Diagnosis not present

## 2014-09-17 LAB — CBC WITH DIFFERENTIAL/PLATELET
Basophils Absolute: 0 10*3/uL (ref 0–0.1)
Basophils Relative: 1 %
Eosinophils Absolute: 0 10*3/uL (ref 0–0.7)
Eosinophils Relative: 0 %
HCT: 40.8 % (ref 35.0–47.0)
Hemoglobin: 13.3 g/dL (ref 12.0–16.0)
Lymphocytes Relative: 36 %
Lymphs Abs: 1.1 10*3/uL (ref 1.0–3.6)
MCH: 30.2 pg (ref 26.0–34.0)
MCHC: 32.4 g/dL (ref 32.0–36.0)
MCV: 93.1 fL (ref 80.0–100.0)
Monocytes Absolute: 0.8 10*3/uL (ref 0.2–0.9)
Monocytes Relative: 27 %
Neutro Abs: 1.1 10*3/uL — ABNORMAL LOW (ref 1.4–6.5)
Neutrophils Relative %: 36 %
Platelets: 233 10*3/uL (ref 150–440)
RBC: 4.39 MIL/uL (ref 3.80–5.20)
RDW: 18.4 % — ABNORMAL HIGH (ref 11.5–14.5)
WBC: 3 10*3/uL — ABNORMAL LOW (ref 3.6–11.0)

## 2014-09-17 LAB — COMPREHENSIVE METABOLIC PANEL
ALT: 33 U/L (ref 14–54)
AST: 31 U/L (ref 15–41)
Albumin: 3.9 g/dL (ref 3.5–5.0)
Alkaline Phosphatase: 74 U/L (ref 38–126)
Anion gap: 5 (ref 5–15)
BUN: 15 mg/dL (ref 6–20)
CO2: 25 mmol/L (ref 22–32)
Calcium: 8.7 mg/dL — ABNORMAL LOW (ref 8.9–10.3)
Chloride: 107 mmol/L (ref 101–111)
Creatinine, Ser: 1.04 mg/dL — ABNORMAL HIGH (ref 0.44–1.00)
GFR calc Af Amer: >60 mL/min (ref >60)
GFR calc non Af Amer: 57 mL/min — ABNORMAL LOW (ref >60)
Glucose, Bld: 99 mg/dL (ref 65–99)
Potassium: 3.5 mmol/L (ref 3.5–5.1)
Sodium: 137 mmol/L (ref 135–145)
Total Bilirubin: 0.6 mg/dL (ref 0.3–1.2)
Total Protein: 7.4 g/dL (ref 6.5–8.1)

## 2014-09-17 MED ORDER — INV-ERIBULIN CHEMO INJECTION 1 MG/2ML RU011201I ACCRU
1.4000 mg/m2 | Freq: Once | INTRAVENOUS | Status: AC
  Administered 2014-09-17: 2.7 mg via INTRAVENOUS
  Filled 2014-09-17: qty 5.4

## 2014-09-17 MED ORDER — SODIUM CHLORIDE 0.9 % IV SOLN
Freq: Once | INTRAVENOUS | Status: AC
  Administered 2014-09-17: 12:00:00 via INTRAVENOUS
  Filled 2014-09-17: qty 4

## 2014-09-17 MED ORDER — HEPARIN SOD (PORK) LOCK FLUSH 100 UNIT/ML IV SOLN
500.0000 [IU] | Freq: Once | INTRAVENOUS | Status: AC | PRN
  Administered 2014-09-17: 500 [IU]
  Filled 2014-09-17: qty 5

## 2014-09-17 MED ORDER — SODIUM CHLORIDE 0.9 % IV SOLN
Freq: Once | INTRAVENOUS | Status: AC
  Administered 2014-09-17: 11:00:00 via INTRAVENOUS
  Filled 2014-09-17: qty 1000

## 2014-09-17 NOTE — Progress Notes (Signed)
09/17/14 @09 :30 [name] returns to clinic this morning for consideration of Cycle 3 Day 1 Eribulin infusion. She has completed her PRO phone survey and has listed the adverse events of fatigue and decreased appetite as "severe" in nature, but reports these responses were based on how she felt last week. Reports she feels good today, and her appetite is good, but states her appetite was very poor last week and she has lost about 3 lbs since her last visit, and nearly 7 lbs since baseline weight check. Continues to report fatigue and states some days she feels like a "rag doll." States she is able to perform her ADLs unassisted, but has no energy to do anything else on these days. She is also experiencing partial hair loss, problems with waking up in the middle of the night - but not falling asleep, 2 consecutive days of "diarrhea" in which she had 2 loose stools each day, and "rare" transient nausea - for which she did not take an antiemetic. Continues to report 1-2 episodes of transient peripheral neuropathy in her hands and fingers, which has not changed since baseline assessment. She reports taking Tramadol 3 times daily to prevent pain; and therefore only has occasional mild pain. Her neutrophil count is 1100 K/uL, which is a grade 2 decrease per CTCAE, but is high enough (1000 K/uL or greater) for the patient to receive her Eribulin infusion today. VS are stable. CBC & MetC values are all otherwise within acceptable treatment parameters. Will proceed with C3D1 Eribulin as planned. AEs identified at this visit along with grade and attribution to study drug include:      Decreased appetite - grade 1; definitely attributed   Fatigue - grade 2; deifnitely attributed   Alopecia - grade 1; definitely attributed   Insomnia - grade 1; unlikely related   Diarrhea - grade 1; definitely attributed   Nausea - grade 1; definitely attributed   Peripheral Neuropathy - grade 1; not attributed   Pain - grade  1; not attributed   Neutrophil Count decrease - grade 2; definitely attributed  09/17/14 @9 :45am Patient was seen by [name] and AE's were discussed. [name] to give her another prescription for Tramadol. Pt also reports she was seen at the wound care center yesterday, and they photographed the lesions on her chest. Copy of photos requested per [name] for patient records. Infusion orders reviewed by [name] and signed.

## 2014-09-17 NOTE — Progress Notes (Signed)
NEDA, WILLENBRING ([id number]) Visit Report for 09/16/2014 Chief Complaint Document Details Patient Name: [name] [name], [name] [name] Date of Service: 09/16/2014 8:15 AM Medical Record Number: [id number] Patient Account Number: [id number] Date of Birth/Sex: 07-04-1950 (64 y.o. Female) Treating RN: Primary Care Physician: PATIENT, NO Other Clinician: Referring Physician: Treating Physician/Extender: Frann Rider in Treatment: 9 Information Obtained from: Patient Chief Complaint Patient presents to the wound care center for a consult due non healing wound patient is a self-referral who comes with a history of having open wounds to her left chest wall and right chest wall for about 2 months. 07/18/2014 -- since last week the patient has had no complaints and is on her last cycle of chemotherapy. I have reviewed 155 pages of her reports from a medical oncologist and the summary is made in the history of present illness. Electronic Signature(s) Signed: 09/16/2014 12:34:34 PM By: Christin Fudge MD, FACS Entered By: Christin Fudge on 09/16/2014 08:38:32 [name] ([id number]) -------------------------------------------------------------------------------- HPI Details Patient Name: [name] Date of Service: 09/16/2014 8:15 AM Medical Record Number: [id number] Patient Account Number: [id number] Date of Birth/Sex: [birth year] (64 y.o. Female) Treating RN: Primary Care Physician: PATIENT, NO Other Clinician: Referring Physician: Treating Physician/Extender: Frann Rider in Treatment: 9 History of Present Illness HPI Description: this 64 year old patient has come by herself today as a self-referral and has no documentation with her. She has had open wounds to her left chest and the right chest wall for about 2 months. She is not a diabetic and has no significant medical history except breast cancer. Her right breast cancer was diagnosed 18 years ago and she had a mastectomy and  chemotherapy. 3 years ago she was then diagnosed with a breast cancer of the left side which was advanced and could not be operated and was given chemotherapy for a year and then followed by radiation. Her last radiation was over a year ago. She has not had any biopsy done recently from the ulcerated area but she says there have been other biopsies done from the chest wall and these reports are not available at the present time. She was told to keep the wounds open and let them dry out but she is finding it's more and more difficult to stop soiling her clothes if she leaves wounds open. She is on chemotherapy and we will try and obtain these notes from her oncologist. She is not in pain and does not have any significant problems except the discomfort from an open wound on the chest wall. 07/18/2014 This is a summary of Kasumi Ditullio date of birth 19-Oct-1950. These are obtained by reviewing 151 pages of medical records which were obtained from the [facility] 64 year old patient who had a history of stage I right breast cancer status post modified radical mastectomy on 06/12/1996. 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 She received 4 cycles of AC at [facility]. In January 2014 she presented with left breast swelling and apparently cellulitis. Breast biopsy done revealed lobular carcinoma which was ER and PR positive and HER-2/neu negative. On PET scan and she was shown to have multiple areas of disease left breast, axilla and supraclavicular areas. She received 6 cycles of chemotherapy and then received Femara because she was not a surgical candidate. PET scan also revealed disease in the left lateral breast and new cervical lymph nodes at level II. The patient was then sent for radiation therapy and received this  over a month. In January 2016 she had a PET CT scan done which was compatible with marked progression of  disease with multifocal soft tissue masses in the chest wall left greater than right. She is currently receiving Xeloda and is noted to have disease in the chest wall which is widely present. Her most recent lab work from 07/02/2014 shows that her CMP is within normal limits with a serum albumin of 4.4. The previous CBC was within normal limits with a WBC count of 4.3 hemoglobin of 14 hematocrit of 40.9 and platelets of 202. 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 We will continue with palliative care and make her comfortable as much as possible as far as her wound care goes. Present time there is no odor but if this does occur we would use carbon- based products to mask the odor. Notes were also reviewed from her surgeon Dr. Jeffrey Byrnett --closure on 05/27/2014 for breast biopsy in [place], Raha W. ([id number]) view of the fact that she had extensive disease on her chest wall. Biopsies are taken from the nodular metastatic disease overlying the sternum and nodules chosen for biopsy. Punch biopsies were taken and the pathology on this confirmed that this was consistent with carcinoma 08/15/2014 -- she is started on a new course of injectable chemotherapy and this is 2 weeks on and 2 weeks off. She has no new symptoms no bleeding from the wounds nor is there any odor. She is doing fine otherwise. 09/16/2014. She is doing very well and the right side wounds have completely healed. She has finished 2 cycles of chemotherapy and she is 2 weeks on and 2 weeks off. He is to restart her chemotherapy this week. No fresh complaints pain is within control and there is no odor from the wounds. Electronic Signature(s) Signed: 09/16/2014 12:34:34 PM By: Britto, Errol MD, FACS Entered By: Britto, Errol on  09/16/2014 08:39:31 Masek, Inaara W. ([id number]) -------------------------------------------------------------------------------- Physical Exam Details Patient Name: Buehrle, Deborra W. Date of Service: 09/16/2014 8:15 AM Medical Record Number: [id number] Patient Account Number: [id number] Date of Birth/Sex: 09/02/1950 (63 y.o. Female) Treating RN: Primary Care Physician: PATIENT, NO Other Clinician: Referring Physician: Treating Physician/Extender: Britto, Errol Weeks in Treatment: 9 Constitutional . Pulse regular. Respirations normal and unlabored. Afebrile. . Eyes Nonicteric. Reactive to light. Ears, Nose, Mouth, and Throat Lips, teeth, and gums WNL.. Moist mucosa without lesions . Neck supple and nontender. No palpable supraclavicular or cervical adenopathy. Normal sized without goiter. Respiratory WNL. No retractions.. Cardiovascular Pedal Pulses WNL. No clubbing, cyanosis or edema. Chest continues to have an ulcerated area on the left lateral chest wall and this shows small amount of slough and no odor.. right breast shows that there is minimal scarring but no open ulceration and gross tumor is palpable.. Integumentary (Hair, Skin) No suspicious lesions. No crepitus or fluctuance. No peri-wound warmth or erythema. No masses.. Psychiatric Judgement and insight Intact.. No evidence of depression, anxiety, or agitation.. Electronic Signature(s) Signed: 09/16/2014 12:34:34 PM By: Britto, Errol MD, FACS Entered By: Britto, Errol on 09/16/2014 08:40:50 Dragoo, Junette W. ([id number]) -------------------------------------------------------------------------------- Physician Orders Details Patient Name: Childrey, Shamaria W. Date of Service: 09/16/2014 8:15 AM Medical Record Number: [id number] Patient Account Number: [id number] Date of Birth/Sex: 03/07/1951 (63 y.o. Female) Treating RN: Dorthy, Joanna Primary Care Physician:   PATIENT, NO Other Clinician: Referring Physician: Treating  Physician/Extender: Britto, Errol Weeks in Treatment: 9 Verbal / Phone Orders: Yes Clinician: Dorthy, Joanna Read Back and Verified: Yes Diagnosis Coding Wound Cleansing Wound #1 Left Chest o Clean wound with Normal Saline. Anesthetic Wound #1 Left Chest o Topical Lidocaine 4% cream applied to wound bed prior to debridement Skin Barriers/Peri-Wound Care Wound #1 Left Chest o Skin Prep Primary Wound Dressing Wound #1 Left Chest o Aquacel Ag Secondary Dressing Wound #1 Left Chest o Boardered Foam Dressing Dressing Change Frequency Wound #1 Left Chest o Change dressing every other day. - or more if needed Follow-up Appointments Wound #1 Left Chest o Return Appointment in 1 month Electronic Signature(s) Signed: 09/16/2014 12:34:34 PM By: Britto, Errol MD, FACS Signed: 09/16/2014 5:11:42 PM By: Dorthy, Joanna Entered By: Dorthy, Joanna on 09/16/2014 08:39:44 [place], Maudine W. ([id number]) Nayak, Jose W. ([id number]) -------------------------------------------------------------------------------- Problem List Details Patient Name: Perin, Amalie W. Date of Service: 09/16/2014 8:15 AM Medical Record Number: [id number] Patient Account Number: [id number] Date of Birth/Sex: 07/22/1950 (63 y.o. Female) Treating RN: Primary Care Physician: PATIENT, NO Other Clinician: Referring Physician: Treating Physician/Extender: Britto, Errol Weeks in Treatment: 9 Active Problems ICD-10 Encounter Code Description Active Date Diagnosis C50.812 Malignant neoplasm of overlapping sites of left female 07/11/2014 Yes breast S21.001A Unspecified open wound of right breast, initial encounter 07/11/2014 Yes C50.412 Malignant neoplasm of upper-outer quadrant of left female 07/11/2014 Yes breast S21.002A Unspecified open wound of left breast, initial encounter 07/11/2014 Yes Inactive Problems Resolved Problems Electronic Signature(s) Signed: 09/16/2014 12:34:34 PM By: Britto, Errol MD,  FACS Entered By: Britto, Errol on 09/16/2014 08:38:26 Gruszka, Viviana W. ([id number]) -------------------------------------------------------------------------------- Progress Note Details Patient Name: Mcbroom, Sudie W. Date of Service: 09/16/2014 8:15 AM Medical Record Number: [id number] Patient Account Number: [id number] Date of Birth/Sex: 10/12/1950 (63 y.o. 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 07/18/2014 -- since last week the patient has had no complaints and is on her last cycle of chemotherapy. I have reviewed 155 pages of her reports from a medical oncologist and the summary is made in the history of present illness. History of Present Illness (HPI) this 63-year-old patient has come by herself today as a self-referral and has no documentation with her. She has had open wounds to her left chest and the right chest wall for about 2 months. She is not a diabetic and has no significant medical history except breast cancer. Her right breast cancer was diagnosed 18 years ago and she had a mastectomy and chemotherapy. 3 years ago she was then diagnosed with a breast cancer of the left side which was advanced and could not be operated and was given chemotherapy for a year and then followed by radiation. Her last radiation was over a year ago. She has not had any biopsy done recently from the ulcerated area but she says there have been other biopsies done from the chest wall and these reports are not available at the present time. She was told to keep the wounds open and let them dry out but she  is finding it's more and more difficult to stop soiling her clothes if she leaves   wounds open. She is on chemotherapy and we will try and obtain these notes from her oncologist. She is not in pain and does not have any significant problems except the discomfort from an open wound on the chest wall. 07/18/2014 This is a summary of Akshitha Culmer date of birth May 11, 1950. These are obtained by reviewing 151 pages of medical records which were obtained from the [facility] 64 year old patient who had a history of stage I right breast cancer status post modified radical mastectomy on 06/12/1996. 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 She received 4 cycles of AC at [facility]. In January 2014 she presented with left breast swelling and apparently cellulitis. Breast biopsy done revealed lobular carcinoma which was ER and PR positive and HER-2/neu negative. On PET scan and she was shown to have multiple areas of disease left breast, axilla and supraclavicular areas. She received 6 cycles of chemotherapy and then received Femara because she was not a surgical candidate. PET scan also revealed disease in the left lateral breast and new cervical lymph nodes at level II. The patient was then sent for radiation therapy and received this over a month. In January 2016 she had a PET CT scan done which was compatible with marked progression of disease with multifocal soft tissue masses in the chest wall left greater than right. She is currently receiving Xeloda and is noted to have disease in the chest wall which is widely present. Her most recent lab work from 07/02/2014 shows that her CMP is within normal limits with a serum albumin Tolle, Berna W. ([id number]) of 4.4. The previous CBC was within normal limits with a WBC count of 4.3 hemoglobin of 14 hematocrit of 40.9 and platelets of 202. After reading the reports as above I believe that  this patient has extensive stage IV breast carcinoma which has now infiltrated into the skin of the chest wall and has fungating tumors coming through in several places especially on the left. We will continue with palliative care and make her comfortable as much as possible as far as her wound care goes. Present time there is no odor but if this does occur we would use carbon- based products to mask the odor. Notes were also reviewed from her surgeon Dr. Hervey Ard --closure on 05/27/2014 for breast biopsy in view of the fact that she had extensive disease on her chest wall. Biopsies are taken from the nodular metastatic disease overlying the sternum and nodules chosen for biopsy. Punch biopsies were taken and the pathology on this confirmed that this was consistent with carcinoma 08/15/2014 -- she is started on a new course of injectable chemotherapy and this is 2 weeks on and 2 weeks off. She has no new symptoms no bleeding from the wounds nor is there any odor. She is doing fine otherwise. 09/16/2014. She is doing very well and the right side wounds have completely healed. She has finished 2 cycles of chemotherapy and she is 2 weeks on and 2 weeks off. He is to restart her chemotherapy this week. No fresh complaints pain is within control and there is no odor from the wounds. Objective Constitutional Pulse regular. Respirations normal and unlabored. Afebrile. Vitals Time Taken: 8:10 AM, Height: 68 in, Weight: 180.5 lbs, BMI: 27.4, Temperature: 98.5 F, Pulse: 87 bpm, Respiratory Rate: 16 breaths/min, Blood Pressure: 139/79 mmHg. Eyes Nonicteric. Reactive to light. Ears, Nose, Mouth, and Throat Lips, teeth, and gums WNL.Marland Kitchen Moist  mucosa without lesions . Neck supple and nontender. No palpable supraclavicular or cervical adenopathy. Normal sized without goiter. Respiratory WNL. No retractions.. Cardiovascular Pedal Pulses WNL. No clubbing, cyanosis or edema. KESHANNA, RISO.  ([id number]) Chest continues to have an ulcerated area on the left lateral chest wall and this shows small amount of slough and no odor.. right breast shows that there is minimal scarring but no open ulceration and gross tumor is palpable.Marland Kitchen Psychiatric Judgement and insight Intact.. No evidence of depression, anxiety, or agitation.. Integumentary (Hair, Skin) No suspicious lesions. No crepitus or fluctuance. No peri-wound warmth or erythema. No masses.. Wound #1 status is Open. Original cause of wound was Other Lesion. The wound is located on the Left Chest. The wound measures 4cm length x 6.4cm width x 0.1cm depth; 20.106cm^2 area and 2.011cm^3 volume. The wound is limited to skin breakdown. There is no tunneling or undermining noted. There is a small amount of serosanguineous drainage noted. The wound margin is distinct with the outline attached to the wound base. There is small (1-33%) red granulation within the wound bed. There is a large (67-100%) amount of necrotic tissue within the wound bed including Adherent Slough. The periwound skin appearance had no abnormalities noted for moisture. The periwound skin appearance had no abnormalities noted for color. The periwound skin appearance exhibited: Induration, Scarring. The periwound skin appearance did not exhibit: Callus, Crepitus, Excoriation, Fluctuance, Friable, Localized Edema, Rash. Periwound temperature was noted as No Abnormality. Wound #2 status is Open. Original cause of wound was Other Lesion. The wound is located on the Right Breast. The wound measures 0cm length x 0cm width x 0cm depth; 0cm^2 area and 0cm^3 volume. The wound is limited to skin breakdown. There is no tunneling or undermining noted. There is a none present amount of drainage noted. There is no granulation within the wound bed. There is no necrotic tissue within the wound bed. The periwound skin appearance had no abnormalities noted for texture. The periwound  skin appearance had no abnormalities noted for moisture. The periwound skin appearance had no abnormalities noted for color. Periwound temperature was noted as No Abnormality. Wound #3 status is Open. Original cause of wound was Other Lesion. The wound is located on the Right,Medial Breast. The wound measures 0cm length x 0cm width x 0cm depth; 0cm^2 area and 0cm^3 volume. The wound is limited to skin breakdown. There is no tunneling or undermining noted. There is a none present amount of drainage noted. There is no granulation within the wound bed. There is no necrotic tissue within the wound bed. The periwound skin appearance had no abnormalities noted for texture. The periwound skin appearance had no abnormalities noted for moisture. The periwound skin appearance had no abnormalities noted for color. Periwound temperature was noted as No Abnormality. Assessment Active Problems ICD-10 C50.812 - Malignant neoplasm of overlapping sites of left female breast S21.001A - Unspecified open wound of right breast, initial encounter BRANDILYNN, TAORMINA. ([id number]) C50.412 - Malignant neoplasm of upper-outer quadrant of left female breast S21.002A - Unspecified open wound of left breast, initial encounter With her new chemotherapy she has received 2 cycles and overall has made good progress for palliative care. Her left chest wall wound is still open but small and clean and has minimal slough. We'll continue with Aquacel dressing every other day with a bordered foam over this. If odor is a problem we will add CarboFlex to this. She will come and see as as scheduled by her home health  needs. Plan Wound Cleansing: Wound #1 Left Chest: Clean wound with Normal Saline. Anesthetic: Wound #1 Left Chest: Topical Lidocaine 4% cream applied to wound bed prior to debridement Skin Barriers/Peri-Wound Care: Wound #1 Left Chest: Skin Prep Primary Wound Dressing: Wound #1 Left Chest: Aquacel Ag Secondary  Dressing: Wound #1 Left Chest: Boardered Foam Dressing Dressing Change Frequency: Wound #1 Left Chest: Change dressing every other day. - or more if needed Follow-up Appointments: Wound #1 Left Chest: Return Appointment in 1 month Cowden, Kylii W. ([id number]) With her new chemotherapy she has received 2 cycles and overall has made good progress for palliative care. Her left chest wall wound is still open but small and clean and has minimal slough. We'll continue with Aquacel dressing every other day with a bordered foam over this. If odor is a problem we will add CarboFlex to this. She will come and see as as scheduled by her home health needs. Electronic Signature(s) Signed: 09/16/2014 12:34:34 PM By: Britto, Errol MD, FACS Entered By: Britto, Errol on 09/16/2014 08:42:31 Wolz, Deriyah W. ([id number]) -------------------------------------------------------------------------------- SuperBill Details Patient Name: Oehlert, Mykel W. Date of Service: 09/16/2014 Medical Record Number: [id number] Patient Account Number: [id number] Date of Birth/Sex: 07/23/1950 (63 y.o. Female) Treating RN: Primary Care Physician: PATIENT, NO Other Clinician: Referring Physician: Treating Physician/Extender: Britto, Errol Weeks in Treatment: 9 Diagnosis Coding ICD-10 Codes Code Description C50.812 Malignant neoplasm of overlapping sites of left female breast S21.001A Unspecified open wound of right breast, initial encounter C50.412 Malignant neoplasm of upper-outer quadrant of left female breast S21.002A Unspecified open wound of left breast, initial encounter Facility Procedures CPT4 Code: 76100137 Description: 99212 - WOUND CARE VISIT-LEV 2 EST PT Modifier: Quantity: 1 Physician Procedures CPT4 Code: 6770416 Description: 99213 - WC PHYS LEVEL 3 - EST PT ICD-10 Description Diagnosis C50.812 Malignant neoplasm of overlapping sites of left S21.001A Unspecified open wound of right breast, initial  S21.002A Unspecified open wound of left breast, initial e Modifier: female breast encounter ncounter Quantity: 1 Electronic Signature(s) Signed: 09/16/2014 9:49:22 AM By: Dorthy, Joanna Signed: 09/16/2014 12:34:34 PM By: Britto, Errol MD, FACS Entered By: Dorthy, Joanna on 09/16/2014 09:49:22 

## 2014-09-17 NOTE — Progress Notes (Signed)
[facility], [facility] ([id number]) Visit Report for 09/16/2014 Arrival Information Details Patient Name: [name] [name], WOLKEN Date of Service: 09/16/2014 8:15 AM Medical Record Number: [id number] Patient Account Number: [id number] Date of Birth/Sex: 08/16/1950 (64 y.o. Female) Treating RN: Junious Dresser Primary Care Physician: PATIENT, NO Other Clinician: Referring Physician: Treating Physician/Extender: Frann Rider in Treatment: 9 Visit Information History Since Last Visit Added or deleted any medications: No Patient Arrived: Ambulatory Any new allergies or adverse reactions: No Arrival Time: 08:19 Had a fall or experienced change in No Accompanied By: self activities of daily living that may affect Transfer Assistance: None risk of falls: Patient Identification Verified: Yes Signs or symptoms of abuse/neglect since last No Secondary Verification Process Yes visito Completed: Has Dressing in Place as Prescribed: Yes Patient Requires Transmission-Based No Pain Present Now: Yes Precautions: Patient Has Alerts: No Electronic Signature(s) Signed: 09/16/2014 4:49:26 PM By: Junious Dresser RN Entered By: Junious Dresser on 09/16/2014 08:20:17 [name] [name] ([id number]) -------------------------------------------------------------------------------- Clinic Level of Care Assessment Details Patient Name: [name] [name] Date of Service: 09/16/2014 8:15 AM Medical Record Number: [id number] Patient Account Number: [id number] Date of Birth/Sex: 11/15/1950 (64 y.o. Female) Treating RN: Montey Hora Primary Care Physician: PATIENT, NO Other Clinician: Referring Physician: Treating Physician/Extender: Frann Rider in Treatment: 9 Clinic Level of Care Assessment Items TOOL 4 Quantity Score []  - Use when only an EandM is performed on FOLLOW-UP visit 0 ASSESSMENTS - Nursing Assessment / Reassessment X - Reassessment of Co-morbidities (includes updates in patient status) 1 10 X -  Reassessment of Adherence to Treatment Plan 1 5 ASSESSMENTS - Wound and Skin Assessment / Reassessment X - Simple Wound Assessment / Reassessment - one wound 1 5 []  - Complex Wound Assessment / Reassessment - multiple wounds 0 []  - Dermatologic / Skin Assessment (not related to wound area) 0 ASSESSMENTS - Focused Assessment []  - Circumferential Edema Measurements - multi extremities 0 []  - Nutritional Assessment / Counseling / Intervention 0 []  - Lower Extremity Assessment (monofilament, tuning fork, pulses) 0 []  - Peripheral Arterial Disease Assessment (using hand held doppler) 0 ASSESSMENTS - Ostomy and/or Continence Assessment and Care []  - Incontinence Assessment and Management 0 []  - Ostomy Care Assessment and Management (repouching, etc.) 0 PROCESS - Coordination of Care X - Simple Patient / Family Education for ongoing care 1 15 []  - Complex (extensive) Patient / Family Education for ongoing care 0 []  - Staff obtains Programmer, systems, Records, Test Results / Process Orders 0 []  - Staff telephones HHA, Nursing Homes / Clarify orders / etc 0 []  - Routine Transfer to another Facility (non-emergent condition) 0 [place], [place] ([id number]) []  - Routine Hospital Admission (non-emergent condition) 0 []  - New Admissions / Biomedical engineer / Ordering NPWT, Apligraf, etc. 0 []  - Emergency Hospital Admission (emergent condition) 0 X - Simple Discharge Coordination 1 10 []  - Complex (extensive) Discharge Coordination 0 PROCESS - Special Needs []  - Pediatric / Minor Patient Management 0 []  - Isolation Patient Management 0 []  - Hearing / Language / Visual special needs 0 []  - Assessment of Community assistance (transportation, D/C planning, etc.) 0 []  - Additional assistance / Altered mentation 0 []  - Support Surface(s) Assessment (bed, cushion, seat, etc.) 0 INTERVENTIONS - Wound Cleansing / Measurement X - Simple Wound Cleansing - one wound 1 5 []  - Complex Wound Cleansing - multiple  wounds 0 X - Wound Imaging (photographs - any number of wounds) 1 5 []  - Wound Tracing (instead of photographs) 0 X - Simple  Wound Measurement - one wound 1 5 []  - Complex Wound Measurement - multiple wounds 0 INTERVENTIONS - Wound Dressings X - Small Wound Dressing one or multiple wounds 1 10 []  - Medium Wound Dressing one or multiple wounds 0 []  - Large Wound Dressing one or multiple wounds 0 []  - Application of Medications - topical 0 []  - Application of Medications - injection 0 INTERVENTIONS - Miscellaneous []  - External ear exam 0 Kautzman, Kathryn W. ([id number]) []  - Specimen Collection (cultures, biopsies, blood, body fluids, etc.) 0 []  - Specimen(s) / Culture(s) sent or taken to Lab for analysis 0 []  - Patient Transfer (multiple staff / Harrel Lemon Lift / Similar devices) 0 []  - Simple Staple / Suture removal (25 or less) 0 []  - Complex Staple / Suture removal (26 or more) 0 []  - Hypo / Hyperglycemic Management (close monitor of Blood Glucose) 0 []  - Ankle / Brachial Index (ABI) - do not check if billed separately 0 X - Vital Signs 1 5 Has the patient been seen at the hospital within the last three years: Yes Total Score: 75 Level Of Care: New/Established - Level 2 Electronic Signature(s) Signed: 09/16/2014 9:48:52 AM By: Montey Hora Entered By: Montey Hora on 09/16/2014 09:48:52 [name] [name] ([id number]) -------------------------------------------------------------------------------- Encounter Discharge Information Details Patient Name: [name] [name] Date of Service: 09/16/2014 8:15 AM Medical Record Number: [id number] Patient Account Number: [id number] Date of Birth/Sex: [birth year] (64 y.o. Female) Treating RN: Primary Care Physician: PATIENT, NO Other Clinician: Referring Physician: Treating Physician/Extender: Frann Rider in Treatment: 9 Encounter Discharge Information Items Schedule Follow-up Appointment: No Medication Reconciliation completed  and No provided to Patient/Care Dariel Betzer: Patient Clinical Summary of Care: Declined Electronic Signature(s) Signed: 09/16/2014 8:42:36 AM By: Sharon Mt Entered By: Sharon Mt on 09/16/2014 08:42:36 [name] [name] ([id number]) -------------------------------------------------------------------------------- Multi Wound Chart Details Patient Name: [name] [name] Date of Service: 09/16/2014 8:15 AM Medical Record Number: [id number] Patient Account Number: [id number] Date of Birth/Sex: 1950/08/01 (64 y.o. Female) Treating RN: Montey Hora Primary Care Physician: PATIENT, NO Other Clinician: Referring Physician: Treating Physician/Extender: Frann Rider in Treatment: 9 Vital Signs Height(in): 68 Pulse(bpm): 87 Weight(lbs): 180.5 Blood Pressure 139/79 (mmHg): Body Mass Index(BMI): 27 Temperature(F): 98.5 Respiratory Rate 16 (breaths/min): Photos: [1:No Photos] [2:No Photos] [3:No Photos] Wound Location: [1:Left Chest] [2:Right Breast] [3:Right Breast - Medial] Wounding Event: [1:Other Lesion] [2:Other Lesion] [3:Other Lesion] Primary Etiology: [1:Malignant Wound] [2:Malignant Wound] [3:Malignant Wound] Comorbid History: [1:Received Chemotherapy, Received Radiation] [2:Received Chemotherapy, Received Radiation] [3:Received Chemotherapy, Received Radiation] Date Acquired: [1:05/10/2014] [2:06/20/2014] [3:06/20/2014] Weeks of Treatment: [1:9] [2:9] [3:9] Wound Status: [1:Open] [2:Open] [3:Open] Measurements L x W x D 4x6.4x0.1 [2:0x0x0] [3:0x0x0] (cm) Area (cm) : [1:20.106] [2:0] [3:0] Volume (cm) : [1:2.011] [2:0] [3:0] % Reduction in Area: [1:14.70%] [2:100.00%] [3:100.00%] % Reduction in Volume: 57.30% [2:100.00%] [3:100.00%] Classification: [1:Full Thickness Without Exposed Support Structures] [2:Full Thickness Without Exposed Support Structures] [3:Full Thickness Without Exposed Support Structures] Exudate Amount: [1:Small] [2:None Present] [3:None  Present] Exudate Type: [1:Serosanguineous] [2:N/A] [3:N/A] Exudate Color: [1:red, brown] [2:N/A] [3:N/A] Foul Odor After [1:No] [2:Yes] [3:No] Cleansing: Odor Anticipated Due to N/A [2:No] [3:N/A] Product Use: Wound Margin: [1:Distinct, outline attached] [2:N/A] [3:N/A] Granulation Amount: [1:Small (1-33%)] [2:None Present (0%)] [3:None Present (0%)] Granulation Quality: [1:Red] [2:N/A] [3:N/A] Necrotic Amount: [1:Large (67-100%)] [2:None Present (0%)] [3:None Present (0%)] Exposed Structures: ABYGAYLE, DELTORO. ([id number]) Fascia: No Fascia: No Fascia: No Fat: No Fat: No Fat: No Tendon: No Tendon: No Tendon: No Muscle: No Muscle: No Muscle: No Joint:  No Joint: No Joint: No Bone: No Bone: No Bone: No Limited to Skin Limited to Skin Limited to Skin Breakdown Breakdown Breakdown Epithelialization: None Large (67-100%) Large (67-100%) Periwound Skin Texture: Induration: Yes No Abnormalities Noted No Abnormalities Noted Scarring: Yes Edema: No Excoriation: No Callus: No Crepitus: No Fluctuance: No Friable: No Rash: No Periwound Skin No Abnormalities Noted No Abnormalities Noted No Abnormalities Noted Moisture: Periwound Skin Color: No Abnormalities Noted No Abnormalities Noted No Abnormalities Noted Temperature: No Abnormality No Abnormality No Abnormality Tenderness on No No No Palpation: Wound Preparation: Ulcer Cleansing: Topical Anesthetic Topical Anesthetic Rinsed/Irrigated with Applied: None Applied: None Saline Topical Anesthetic Applied: Other: 4% Lidocaine Treatment Notes Electronic Signature(s) Signed: 09/16/2014 5:11:42 PM By: Montey Hora Entered By: Montey Hora on 09/16/2014 08:36:44 [name] [name] ([id number]) -------------------------------------------------------------------------------- [place] Details Patient Name: [name] [name] Date of Service: 09/16/2014 8:15 AM Medical Record Number: [id number] Patient  Account Number: [id number] Date of Birth/Sex: 11/25/1950 (64 y.o. Female) Treating RN: Montey Hora Primary Care Physician: PATIENT, NO Other Clinician: Referring Physician: Treating Physician/Extender: Frann Rider in Treatment: 9 Active Inactive Abuse / Safety / Falls / Self Care Management Nursing Diagnoses: Potential for falls Goals: Patient will remain injury free Date Initiated: 07/11/2014 Goal Status: Active Interventions: Assess fall risk on admission and as needed Notes: Malignancy/Atypical Etiology Nursing Diagnoses: Knowledge deficit related to disease process and management of atypical ulcer etiology Goals: Patient/caregiver will verbalize understanding of disease process and disease management of atypical ulcer etiology Date Initiated: 07/11/2014 Goal Status: Active Interventions: Provide education on atypical ulcer etiologies Notes: Nutrition Nursing Diagnoses: Potential for alteratiion in Nutrition/Potential for imbalanced nutrition Goals: ZOHA, SPRANGER ([id number]) Patient/caregiver agrees to and verbalizes understanding of need to use nutritional supplements and/or vitamins as prescribed Date Initiated: 07/11/2014 Goal Status: Active Interventions: Assess patient nutrition upon admission and as needed per policy Notes: Orientation to the Wound Care Program Nursing Diagnoses: Knowledge deficit related to the wound healing center program Goals: Patient/caregiver will verbalize understanding of the [facility] Date Initiated: 07/11/2014 Goal Status: Active Interventions: Provide education on orientation to the wound center Notes: Wound/Skin Impairment Nursing Diagnoses: Impaired tissue integrity Goals: Patient will demonstrate a reduced rate of smoking or cessation of smoking Date Initiated: 07/11/2014 Goal Status: Active Patient will have a decrease in wound volume by X% from date: (specify in notes) Date Initiated:  07/11/2014 Goal Status: Active Patient/caregiver will verbalize understanding of skin care regimen Date Initiated: 07/11/2014 Goal Status: Active Ulcer/skin breakdown will have a volume reduction of 30% by week 4 Date Initiated: 07/11/2014 Goal Status: Active Ulcer/skin breakdown will have a volume reduction of 50% by week 8 Date Initiated: 07/11/2014 Goal Status: Active Ulcer/skin breakdown will have a volume reduction of 80% by week 12 SUMMERLYN, FICKEL ([id number]) Date Initiated: 07/11/2014 Goal Status: Active Ulcer/skin breakdown will heal within 14 weeks Date Initiated: 07/11/2014 Goal Status: Active Interventions: Assess patient/caregiver ability to obtain necessary supplies Assess patient/caregiver ability to perform ulcer/skin care regimen upon admission and as needed Assess ulceration(s) every visit Provide education on smoking Provide education on ulcer and skin care Screen for HBO Notes: Electronic Signature(s) Signed: 09/16/2014 5:11:42 PM By: Montey Hora Entered By: Montey Hora on 09/16/2014 08:36:35 [name] [name] ([id number]) -------------------------------------------------------------------------------- Pain Assessment Details Patient Name: [name] [name] Date of Service: 09/16/2014 8:15 AM Medical Record Number: [id number] Patient Account Number: [id number] Date of Birth/Sex: 23-May-1950 (64 y.o. Female) Treating RN: Junious Dresser Primary Care Physician: PATIENT, NO Other Clinician:  Referring Physician: Treating Physician/Extender: Frann Rider in Treatment: 9 Active Problems Location of Pain Severity and Description of Pain Patient Has Paino Yes Site Locations Pain Location: Generalized Pain With Dressing Change: No Duration of the Pain. Constant / Intermittento Constant Rate the pain. Current Pain Level: 2 Worst Pain Level: 2 Least Pain Level: 2 Pain Management and Medication Current Pain Management: Notes pain in left  arm Electronic Signature(s) Signed: 09/16/2014 4:49:26 PM By: Junious Dresser RN Entered By: Junious Dresser on 09/16/2014 08:19:54 [name] [name] ([id number]) -------------------------------------------------------------------------------- Patient/Caregiver Education Details Patient Name: [name] [name] Date of Service: 09/16/2014 8:15 AM Medical Record Number: [id number] Patient Account Number: [id number] Date of Birth/Gender: 1951/03/16 (64 y.o. Female) Treating RN: Junious Dresser Primary Care Physician: PATIENT, NO Other Clinician: Referring Physician: Treating Physician/Extender: Frann Rider in Treatment: 9 Education Assessment Education Provided To: Patient Education Topics Provided Malignant/Atypical Wounds: Methods: Explain/Verbal Responses: State content correctly Wound/Skin Impairment: Methods: Explain/Verbal Responses: State content correctly Electronic Signature(s) Signed: 09/16/2014 4:49:26 PM By: Junious Dresser RN Entered By: Junious Dresser on 09/16/2014 08:48:29 [name] [name] ([id number]) -------------------------------------------------------------------------------- Wound Assessment Details Patient Name: [name] [name] Date of Service: 09/16/2014 8:15 AM Medical Record Number: [id number] Patient Account Number: [id number] Date of Birth/Sex: [birth year] (64 y.o. Female) Treating RN: Junious Dresser Primary Care Physician: PATIENT, NO Other Clinician: Referring Physician: Treating Physician/Extender: Frann Rider in Treatment: 9 Wound Status Wound Number: 1 Primary Malignant Wound Etiology: Wound Location: Left Chest Wound Status: Open Wounding Event: Other Lesion Comorbid Received Chemotherapy, Received Date Acquired: 05/10/2014 History: Radiation Weeks Of Treatment: 9 Clustered Wound: No Photos Photo Uploaded By: Junious Dresser on 09/16/2014 16:43:16 Wound Measurements Length: (cm) 4 Width: (cm) 6.4 Depth: (cm) 0.1 Area: (cm)  20.106 Volume: (cm) 2.011 % Reduction in Area: 14.7% % Reduction in Volume: 57.3% Epithelialization: None Tunneling: No Undermining: No Wound Description Full Thickness Without Exposed Classification: Support Structures Wound Margin: Distinct, outline attached Exudate Small Amount: LONYA, JOHANNESEN. ([id number]) Foul Odor After Cleansing: No Exudate Type: Serosanguineous Exudate Color: red, brown Wound Bed Granulation Amount: Small (1-33%) Exposed Structure Granulation Quality: Red Fascia Exposed: No Necrotic Amount: Large (67-100%) Fat Layer Exposed: No Necrotic Quality: Adherent Slough Tendon Exposed: No Muscle Exposed: No Joint Exposed: No Bone Exposed: No Limited to Skin Breakdown Periwound Skin Texture Texture Color No Abnormalities Noted: No No Abnormalities Noted: Yes Callus: No Temperature / Pain Crepitus: No Temperature: No Abnormality Excoriation: No Fluctuance: No Friable: No Induration: Yes Localized Edema: No Rash: No Scarring: Yes Moisture No Abnormalities Noted: Yes Wound Preparation Ulcer Cleansing: Rinsed/Irrigated with Saline Topical Anesthetic Applied: Other: 4% Lidocaine, Treatment Notes Wound #1 (Left Chest) 1. Cleansed with: Clean wound with Normal Saline 2. Anesthetic Topical Lidocaine 4% cream to wound bed prior to debridement 4. Dressing Applied: Aquacel Ag 5. Secondary Dressing Applied Bordered Foam Dressing Electronic Signature(s) Signed: 09/16/2014 4:49:26 PM By: Junious Dresser RN Entered By: Junious Dresser on 09/16/2014 08:27:51 [name] [name] ([id number]) Archie Balboa, Elouise Munroe ([id number]) -------------------------------------------------------------------------------- Wound Assessment Details Patient Name: [name] [name] Date of Service: 09/16/2014 8:15 AM Medical Record Number: [id number] Patient Account Number: [id number] Date of Birth/Sex: 1950-07-02 (64 y.o. Female) Treating RN: Junious Dresser Primary Care Physician:  PATIENT, NO Other Clinician: Referring Physician: Treating Physician/Extender: Frann Rider in Treatment: 9 Wound Status Wound Number: 2 Primary Malignant Wound Etiology: Wound Location: Right Breast Wound Status: Open Wounding Event: Other Lesion Comorbid Received Chemotherapy, Received Date Acquired: 06/20/2014 History: Radiation Weeks Of Treatment: 9 Clustered Wound: No  Photos Photo Uploaded By: Junious Dresser on 09/16/2014 16:43:17 Wound Measurements Length: (cm) 0 % Reduction i Width: (cm) 0 % Reduction i Depth: (cm) 0 Epithelializa Area: (cm) 0 Tunneling: Volume: (cm) 0 Undermining: n Area: 100% n Volume: 100% tion: Large (67-100%) No No Wound Description Full Thickness Without Exposed Foul Odor A Classification: Support Structures Due to Prod Exudate None Present Amount: fter Cleansing: Yes uct Use: No Wound Bed Granulation Amount: None Present (0%) Exposed Structure Necrotic Amount: None Present (0%) Fascia Exposed: No Fat Layer Exposed: No Tendon Exposed: No Muscle Exposed: No Joint Exposed: No Impastato, Jackye W. ([id number]) Bone Exposed: No Limited to Skin Breakdown Periwound Skin Texture Texture Color No Abnormalities Noted: Yes No Abnormalities Noted: Yes Moisture Temperature / Pain No Abnormalities Noted: Yes Temperature: No Abnormality Wound Preparation Topical Anesthetic Applied: None Electronic Signature(s) Signed: 09/16/2014 4:49:26 PM By: Junious Dresser RN Entered By: Junious Dresser on 09/16/2014 40:98:11 [name] [name] ([id number]) -------------------------------------------------------------------------------- Wound Assessment Details Patient Name: [name] [name] Date of Service: 09/16/2014 8:15 AM Medical Record Number: [id number] Patient Account Number: [id number] Date of Birth/Sex: 23-Aug-1950 (64 y.o. Female) Treating RN: Junious Dresser Primary Care Physician: PATIENT, NO Other Clinician: Referring  Physician: Treating Physician/Extender: Frann Rider in Treatment: 9 Wound Status Wound Number: 3 Primary Malignant Wound Etiology: Wound Location: Right Breast - Medial Wound Status: Open Wounding Event: Other Lesion Comorbid Received Chemotherapy, Received Date Acquired: 06/20/2014 History: Radiation Weeks Of Treatment: 9 Clustered Wound: No Photos Photo Uploaded By: Junious Dresser on 09/16/2014 16:43:46 Wound Measurements Length: (cm) 0 % Reduction in Width: (cm) 0 % Reduction in Depth: (cm) 0 Epithelializat Area: (cm) 0 Tunneling: Volume: (cm) 0 Undermining: Area: 100% Volume: 100% ion: Large (67-100%) No No Wound Description Full Thickness Without Exposed Foul Odor A Classification: Support Structures Exudate None Present Amount: ANTOINE, FIALLOS ([id number]) fter Cleansing: No Wound Bed Granulation Amount: None Present (0%) Exposed Structure Necrotic Amount: None Present (0%) Fascia Exposed: No Fat Layer Exposed: No Tendon Exposed: No Muscle Exposed: No Joint Exposed: No Bone Exposed: No Limited to Skin Breakdown Periwound Skin Texture Texture Color No Abnormalities Noted: Yes No Abnormalities Noted: Yes Moisture Temperature / Pain No Abnormalities Noted: Yes Temperature: No Abnormality Wound Preparation Topical Anesthetic Applied: None Electronic Signature(s) Signed: 09/16/2014 4:49:26 PM By: Junious Dresser RN Entered By: Junious Dresser on 09/16/2014 08:28:52 [name] [name] ([id number]) -------------------------------------------------------------------------------- Vitals Details Patient Name: [name] [name] Date of Service: 09/16/2014 8:15 AM Medical Record Number: [id number] Patient Account Number: [id number] Date of Birth/Sex: 1951/04/01 (64 y.o. Female) Treating RN: Junious Dresser Primary Care Physician: PATIENT, NO Other Clinician: Referring Physician: Treating Physician/Extender: Frann Rider in Treatment: 9 Vital  Signs Time Taken: 08:10 Temperature (F): 98.5 Height (in): 68 Pulse (bpm): 87 Weight (lbs): 180.5 Respiratory Rate (breaths/min): 16 Body Mass Index (BMI): 27.4 Blood Pressure (mmHg): 139/79 Reference Range: 80 - 120 mg / dl Electronic Signature(s) Signed: 09/16/2014 4:49:26 PM By: Junious Dresser RN Entered By: Junious Dresser on 09/16/2014 08:19:07

## 2014-09-24 ENCOUNTER — Encounter: Payer: Self-pay | Admitting: Hematology and Oncology

## 2014-09-24 ENCOUNTER — Inpatient Hospital Stay: Payer: BLUE CROSS/BLUE SHIELD

## 2014-09-24 ENCOUNTER — Ambulatory Visit: Payer: BLUE CROSS/BLUE SHIELD

## 2014-09-24 ENCOUNTER — Other Ambulatory Visit: Payer: Self-pay | Admitting: Hematology and Oncology

## 2014-09-24 ENCOUNTER — Encounter: Payer: Self-pay | Admitting: *Deleted

## 2014-09-24 VITALS — BP 128/67 | HR 87 | Temp 96.6°F | Resp 20

## 2014-09-24 DIAGNOSIS — E876 Hypokalemia: Secondary | ICD-10-CM

## 2014-09-24 DIAGNOSIS — C50912 Malignant neoplasm of unspecified site of left female breast: Secondary | ICD-10-CM

## 2014-09-24 LAB — CBC WITH DIFFERENTIAL/PLATELET
Basophils Absolute: 0 10*3/uL (ref 0–0.1)
Basophils Relative: 0 %
Eosinophils Absolute: 0 10*3/uL (ref 0–0.7)
Eosinophils Relative: 0 %
HCT: 38.6 % (ref 35.0–47.0)
Hemoglobin: 12.7 g/dL (ref 12.0–16.0)
Lymphocytes Relative: 55 %
Lymphs Abs: 2.3 10*3/uL (ref 1.0–3.6)
MCH: 30.5 pg (ref 26.0–34.0)
MCHC: 32.8 g/dL (ref 32.0–36.0)
MCV: 93.1 fL (ref 80.0–100.0)
Monocytes Absolute: 0.5 10*3/uL (ref 0.2–0.9)
Monocytes Relative: 13 %
Neutro Abs: 1.3 10*3/uL — ABNORMAL LOW (ref 1.4–6.5)
Neutrophils Relative %: 32 %
Platelets: 215 10*3/uL (ref 150–440)
RBC: 4.15 MIL/uL (ref 3.80–5.20)
RDW: 17.3 % — ABNORMAL HIGH (ref 11.5–14.5)
WBC: 4.1 10*3/uL (ref 3.6–11.0)

## 2014-09-24 LAB — COMPREHENSIVE METABOLIC PANEL
ALT: 28 U/L (ref 14–54)
AST: 30 U/L (ref 15–41)
Albumin: 3.9 g/dL (ref 3.5–5.0)
Alkaline Phosphatase: 69 U/L (ref 38–126)
Anion gap: 7 (ref 5–15)
BUN: 10 mg/dL (ref 6–20)
CO2: 25 mmol/L (ref 22–32)
Calcium: 8.6 mg/dL — ABNORMAL LOW (ref 8.9–10.3)
Chloride: 105 mmol/L (ref 101–111)
Creatinine, Ser: 0.74 mg/dL (ref 0.44–1.00)
GFR calc Af Amer: >60 mL/min (ref >60)
GFR calc non Af Amer: >60 mL/min (ref >60)
Glucose, Bld: 84 mg/dL (ref 65–99)
Potassium: 3.2 mmol/L — ABNORMAL LOW (ref 3.5–5.1)
Sodium: 137 mmol/L (ref 135–145)
Total Bilirubin: 0.3 mg/dL (ref 0.3–1.2)
Total Protein: 7.4 g/dL (ref 6.5–8.1)

## 2014-09-24 MED ORDER — HEPARIN SOD (PORK) LOCK FLUSH 100 UNIT/ML IV SOLN
INTRAVENOUS | Status: AC
  Filled 2014-09-24: qty 5

## 2014-09-24 MED ORDER — INV-ERIBULIN CHEMO INJECTION 1 MG/2ML RU011201I ACCRU
1.4000 mg/m2 | Freq: Once | INTRAVENOUS | Status: AC
  Administered 2014-09-24: 2.7 mg via INTRAVENOUS
  Filled 2014-09-24: qty 5.4

## 2014-09-24 MED ORDER — SODIUM CHLORIDE 0.9 % IJ SOLN
10.0000 mL | INTRAMUSCULAR | Status: DC | PRN
  Administered 2014-09-24: 10 mL
  Filled 2014-09-24: qty 10

## 2014-09-24 MED ORDER — TRAMADOL HCL 50 MG PO TABS: 50.0000 mg | ORAL_TABLET | Freq: Three times a day (TID) | ORAL | Status: DC | PRN

## 2014-09-24 MED ORDER — POTASSIUM CHLORIDE CRYS ER 10 MEQ PO TBCR: 10.0000 meq | EXTENDED_RELEASE_TABLET | Freq: Two times a day (BID) | ORAL | Status: DC

## 2014-09-24 MED ORDER — SODIUM CHLORIDE 0.9 % IV SOLN
Freq: Once | INTRAVENOUS | Status: AC
  Administered 2014-09-24: 15:00:00 via INTRAVENOUS
  Filled 2014-09-24: qty 4

## 2014-09-24 MED ORDER — SODIUM CHLORIDE 0.9 % IV SOLN
Freq: Once | INTRAVENOUS | Status: AC
  Administered 2014-09-24: 14:00:00 via INTRAVENOUS
  Filled 2014-09-24: qty 250

## 2014-09-24 MED ORDER — HEPARIN SOD (PORK) LOCK FLUSH 100 UNIT/ML IV SOLN
500.0000 [IU] | Freq: Once | INTRAVENOUS | Status: AC | PRN
  Administered 2014-09-24: 500 [IU]

## 2014-09-24 NOTE — Progress Notes (Signed)
Ms. Maree Ainley returns to clinic this afternoon for consideration of Cycle 3 Day 8 Eribulin infusion. Patient reports she has felt better this past week, and anticipates the coming week will be a little worse. States she is going out of town on vacation for several days beginning Saturday - 09/28/14. She has a friend with her this afternoon. PRO phone assessment responses reviewed with patient and she denies any new adverse events, but continues to experience the following events - which she states are mostly less severe than they had been at her last visit:    Decreased appetite - grade 1; definitely attributed Fatigue - grade 1; deifnitely attributed Alopecia - grade 1; definitely attributed Insomnia - grade 1; unlikely related Peripheral Neuropathy - grade 1; not attributed Pain - grade 1; not attributed Neutrophil Count decrease - grade 2; definitely attributed  Ms. Archuleta states her appetite has improved a little and she is trying to eat regularly. Reports she still feels "tired" most of the time, but is not "dragging" as much as before and is well able to perform her ADLs without assistance. Sleep pattern has not changed, and pt reports one episode of difficulty falling asleep in the past week. 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 That she states lasts only a short while and dies not interfere with ambulation at all. Encouraged patient to continue to report any episode of new or increased sensation of numbness or tingling as this can affect whether she receives her chemo, or whether we need to reduce the dose she is receiving. States the occasional shooting pain across her chest has not really changed, but also reports she ran out of Tramadol and did not take it for several days - and the pain did not increase. New  prescription of Tramadol was given to patient per [name] today, and discussed with Ms.Schliep that she may not need to take the medication 3 times daily, but that she should be aware of when the pain starts and whether there is a trigger, and go ahead and take it to prevent any pain if possible. States she will try just taking it at bedtime to see how that works. Patient VS are stable this afternoon. ANC is 1300 and must be at least 1000 for treatment. All other labs are within acceptable treatment parameters per [id number] I protocol guidelines. Reviewed all with [name] and she will sign orders to proceed with scheduled C3D8 Eribulin infusion.  09/24/14 @4 :44pm [name] states the lab also drew a [facility] today and that Ms. Gutridge's potassium level is 3.2 She has sent a prescription over to the patient's pharmacy and wants her to take one 10 Meq tables twice daily for the next 3 days. T/C made to patient to inform that her potassium level was a little low today and instructed that [name] has sent over a prescription for potassium pills and wants her to take the potassium twice daily for the next 3 days. Patient states she still has some potassium from February when it was prescribed before. She verifies that they are 10 Meq tablets and that she has #20 left in the bottle. Instructed to go ahead and begin taking these today and continue for the next 3 days. Patient able to repeat instructions back correctly.

## 2014-09-30 NOTE — Progress Notes (Signed)
[facility] day:  09/17/2014  Chief Complaint: [name] [name] is a 64 y.o. female with recurrent breast cancer who is seen for assessment prior to day 1 of cycle #3 Halaven.  HPI: The patient was last seen in the medical oncology clinic on 08/27/2014.  At that time,  she began cycle #2.  Symptomatically, she felt good.  She described her appetite being down the week prior. She had an isolated episode of loose stool. She had stable pretreatment neuropathy.  Exam revealed resolving right chest wall lesions, smaller superior sternal lesion, drying of the left anterior chest wall lesions, and a large denuded area in the left lateral chest wall.   She continues to tolerate her chemotherapy well. She describes " not being sick". She had 2 days of loose stools (2 episodes each day). Her neuropathy is stable.  She had issues with fatigue. Her appetite was down 2 weeks ago. She lost 3 pounds. She denies mouth sores or tenderness. She went to wound care yesterday. Chest wall lesions are smaller.  Two right-sided lesions have healed. She is taking Tramadol 3 times a day (no change).  Past Medical History  Diagnosis Date  . Personal history of malignant neoplasm of breast   . Breast cancer   . Anemia   . Blood transfusion without reported diagnosis   . Emphysema of lung     Past Surgical History  Procedure Laterality Date  . Oophrectomy  1980  . Abdominal hysterectomy  1995  . Breast surgery Right 1996    mastectomy  . Breast biopsy Left 2014  . Breast biopsy Left 01-21-14    Family History  Problem Relation Age of Onset  . Cancer Father   . Heart disease Sister   . Diabetes Sister   . Heart disease Brother   . Cancer Paternal Grandmother     breast    Social History:  reports that she has been smoking Cigarettes.  She has a 25 pack-year smoking history. She has never used smokeless tobacco. She reports that she does not drink alcohol or use  illicit drugs.  The patient is accompanied by Karma Ganja, protocol nurse.  Allergies:  Allergies  Allergen Reactions  . Sulfa Antibiotics Nausea And Vomiting    headache    Current Medications: Current Outpatient Prescriptions  Medication Sig Dispense Refill  . Calcium Carbonate-Vitamin D (CALCIUM + D PO) Take 2 tablets by mouth daily.    Marland Kitchen LORazepam (ATIVAN) 0.5 MG tablet   0  . KLOR-CON 10 10 MEQ tablet   0  . potassium chloride SA (K-DUR,KLOR-CON) 10 MEQ tablet Take 1 tablet (10 mEq total) by mouth 2 (two) times daily. for 3 days 20 tablet 0  . traMADol (ULTRAM) 50 MG tablet Take 1 tablet (50 mg total) by mouth every 8 (eight) hours as needed. 90 tablet 0   No current facility-administered medications for this visit.   Facility-Administered Medications Ordered in Other Visits  Medication Dose Route Frequency Provider Last Rate Last Dose  . sodium chloride 0.9 % injection 10 mL  10 mL Intracatheter PRN Lequita Asal, MD      . sodium chloride 0.9 % injection 10 mL  10 mL Intracatheter PRN Lequita Asal, MD   10 mL at 08/27/14 1400    Review of Systems:  GENERAL:  Fatigue.  No fevers or sweats.  Weight loss of 3 pounds. PERFORMANCE STATUS (ECOG):  1 HEENT:  No  visual changes, runny nose, sore throat, mouth sores or tenderness. Lungs: No shortness of breath or cough.  No hemoptysis. Cardiac:  No chest pain, palpitations, orthopnea, or PND. GI:  Appetite down.  Loose stool x 2 days (resolved).  No nausea, vomiting, diarrhea, constipation, melena or hematochezia. GU:  No urgency, frequency, dysuria, or hematuria. Musculoskeletal:  No back pain.  No joint pain.  No muscle tenderness. Extremities:  No pain or swelling. Skin:  Hair thinning.  No rashes or skin changes. Neuro:  Baseline neuropathy pre-treatment.  No headache, numbness or weakness, balance or coordination issues. Endocrine:  No diabetes, thyroid issues, hot flashes or night sweats. Psych:  Insomnia.  No  mood changes, depression or anxiety. Pain:  Stable pain treated with Tramadol. Review of systems:  All other systems reviewed and found to be negative.   Physical Exam: Blood pressure 122/82, pulse 90, temperature 96.9 F (36.1 C), temperature source Tympanic, resp. rate 20, height 5' 8"  (1.727 m), weight 176 lb 2.4 oz (79.9 kg), SpO2 98 %.  GENERAL: Well developed, well nourished, sitting comfortably in the exam room in no acute distress. MENTAL STATUS: Alert and oriented to person, place and time. HEAD: Short brown thinning hair with slight graying. Normocephalic, atraumatic, face symmetric, no Cushingoid features. EYES: Brown eyes. Right lateral eye injected.  Pupils equal round and reactive to light and accomodation. No conjunctivitis or scleral icterus. ENT: Oropharynx clear without lesion. Tongue normal. Mucous membranes moist.  RESPIRATORY: Clear to auscultation without rales, wheezes or rhonchi. CARDIOVASCULAR: Regular rate and rhythm without murmur, rub or gallop. BREAST: Right chest wall lesions healed. Central upper sternal area flat.  Left lateral denuded area triangular in shape  measuring 7 x 4 cm in greatest dimension, but tapering (arrow head shape). ABDOMEN: Soft, non-tender, with active bowel sounds, and no hepatosplenomegaly. No masses. SKIN: Cutaneous breast cancer noted in breast section. No other lesions.  Long nails. EXTREMITIES: No edema, no skin discoloration or tenderness. No palpable cords. LYMPH NODES: Left axillary ridge stable. No palpable cervical, supraclavicular, axillary or inguinal adenopathy  NEUROLOGICAL: Unremarkable. PSYCH: Appropriate.  Infusion on 09/17/2014  Component Date Value Ref Range Status  . WBC 09/17/2014 3.0* 3.6 - 11.0 K/uL Final   A-LINE DRAW  . RBC 09/17/2014 4.39  3.80 - 5.20 MIL/uL Final  . Hemoglobin 09/17/2014 13.3  12.0 - 16.0 g/dL Final  . HCT 09/17/2014 40.8  35.0 - 47.0 % Final  . MCV 09/17/2014 93.1  80.0  - 100.0 fL Final  . MCH 09/17/2014 30.2  26.0 - 34.0 pg Final  . MCHC 09/17/2014 32.4  32.0 - 36.0 g/dL Final  . RDW 09/17/2014 18.4* 11.5 - 14.5 % Final  . Platelets 09/17/2014 233  150 - 440 K/uL Final  . Neutrophils Relative % 09/17/2014 36   Final  . Neutro Abs 09/17/2014 1.1* 1.4 - 6.5 K/uL Final  . Lymphocytes Relative 09/17/2014 36   Final  . Lymphs Abs 09/17/2014 1.1  1.0 - 3.6 K/uL Final  . Monocytes Relative 09/17/2014 27   Final  . Monocytes Absolute 09/17/2014 0.8  0.2 - 0.9 K/uL Final  . Eosinophils Relative 09/17/2014 0   Final  . Eosinophils Absolute 09/17/2014 0.0  0 - 0.7 K/uL Final  . Basophils Relative 09/17/2014 1   Final  . Basophils Absolute 09/17/2014 0.0  0 - 0.1 K/uL Final  . Sodium 09/17/2014 137  135 - 145 mmol/L Final  . Potassium 09/17/2014 3.5  3.5 - 5.1 mmol/L Final  .  Chloride 09/17/2014 107  101 - 111 mmol/L Final  . CO2 09/17/2014 25  22 - 32 mmol/L Final  . Glucose, Bld 09/17/2014 99  65 - 99 mg/dL Final  . BUN 09/17/2014 15  6 - 20 mg/dL Final  . Creatinine, Ser 09/17/2014 1.04* 0.44 - 1.00 mg/dL Final  . Calcium 09/17/2014 8.7* 8.9 - 10.3 mg/dL Final  . Total Protein 09/17/2014 7.4  6.5 - 8.1 g/dL Final  . Albumin 09/17/2014 3.9  3.5 - 5.0 g/dL Final  . AST 09/17/2014 31  15 - 41 U/L Final  . ALT 09/17/2014 33  14 - 54 U/L Final  . Alkaline Phosphatase 09/17/2014 74  38 - 126 U/L Final  . Total Bilirubin 09/17/2014 0.6  0.3 - 1.2 mg/dL Final  . GFR calc non Af Amer 09/17/2014 57* >60 mL/min Final  . GFR calc Af Amer 09/17/2014 >60  >60 mL/min Final   Comment: (NOTE) The eGFR has been calculated using the CKD EPI equation. This calculation has not been validated in all clinical situations. eGFR's persistently <60 mL/min signify possible Chronic Kidney Disease.   . Anion gap 09/17/2014 5  5 - 15 Final    Assessment:  DYNASIA KERCHEVAL is a 64 y.o. African American woman with a history of stage I right breast cancer s/p modified radical  mastectomy on 06/12/1996. Pathology revealed a grade III mutifocal infiltrating ductal carcinoma (tumor size 8.5 cm with an invasive component 1.6 cm). Twenty-one lymph nodes were negative. Tumor was ER/PR negative. She received 4 cycles of AC at [facility].  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 PET scan on 05/22/2012 revealed multiple areas of disease. She received 6 cycles of Taxotere and Cytoxan (TC) from 06/06/2012 - 09/29/2012. PET scan on 09/07/2012 noted marked improvement. Post chemotherapy biopsy was positive. She was not a surgical candidate. She was placed on Femara.  PET scan on 05/03/2013 revealed a new focus along lateral left breast and a new level II cervical lymph node. She received radiation from 09/13/2013 - 11/05/2013. PET scan on 01/01/2014 revealed new nodule medial left chest wall pleural/paraspinal activity LUL. Multiple biopsies on 01/19/2014 were positive. She began daily letrozole and Ibrance on 02/12/2014. Despite treatment, new nodules formed. She received a total of 2 cycles.   PET scan on 05/09/2014 revealed marked progression of disease with multifocal masses in chest wall (left > right). She did not receive Imbrance in 04/2014. CA27.29 has increased: 22.9 on 01/08/2014, 54 on 03/05/2014, 170.8 on 05/06/2014, 250.8 on 05/23/2014, 445.8 on 06/14/2014, and 531.1 on 07/01/2104.   She received 3 cycles of Xeloda (02/12 - 07/05/2014). Chest and abdomen CT scan on 07/05/2014 revealed persistent and slightly progressive diffuse locally invasive chest wall tumor. Bone scan on 07/25/2014 revealed no evidence of metastatic disease.   She is status post 2 cycles of Halaven (08/06/2014 - 08/27/2014) on ACCRU [id number] I. Cycle #1 was notable for an ANC of 200 on day 15. Cycle #2 was notable for grade II fatigue, brief grade I diarrhea, and decreased  appetite.    Symptomatically, she is fatigued. Exam reveals resolution of right chest wall lesions, stable superior sternal lesion, and a large triangle shaped denuded area in the left lateral chest wall.   Plan: 1. Labs today: CBC with diff, CMP, CA27.29. 2. Day 1 of cycle #3 Halaven. 3. Medical photographs. 4. RTC in 1 week for labs and day 8 of  cycle #2 Halaven. 5. Refill Tramadol. 6. RTC in 3 weeks for MD assessment, labs (CBC with diff, CMP, CA27.29), and cycle #4 Halaven.   Lequita Asal, MD  09/17/2014, 6:20 PM

## 2014-10-08 ENCOUNTER — Inpatient Hospital Stay (HOSPITAL_BASED_OUTPATIENT_CLINIC_OR_DEPARTMENT_OTHER): Payer: BLUE CROSS/BLUE SHIELD | Admitting: Hematology and Oncology

## 2014-10-08 ENCOUNTER — Encounter: Payer: Self-pay | Admitting: Hematology and Oncology

## 2014-10-08 ENCOUNTER — Inpatient Hospital Stay: Payer: BLUE CROSS/BLUE SHIELD

## 2014-10-08 ENCOUNTER — Encounter: Payer: Self-pay | Admitting: *Deleted

## 2014-10-08 VITALS — BP 141/92 | HR 96 | Temp 96.9°F | Resp 18 | Wt 173.7 lb

## 2014-10-08 DIAGNOSIS — F17210 Nicotine dependence, cigarettes, uncomplicated: Secondary | ICD-10-CM

## 2014-10-08 DIAGNOSIS — Z853 Personal history of malignant neoplasm of breast: Secondary | ICD-10-CM | POA: Diagnosis not present

## 2014-10-08 DIAGNOSIS — Z171 Estrogen receptor negative status [ER-]: Secondary | ICD-10-CM | POA: Diagnosis not present

## 2014-10-08 DIAGNOSIS — C50912 Malignant neoplasm of unspecified site of left female breast: Secondary | ICD-10-CM | POA: Diagnosis not present

## 2014-10-08 DIAGNOSIS — Z006 Encounter for examination for normal comparison and control in clinical research program: Secondary | ICD-10-CM

## 2014-10-08 DIAGNOSIS — R5383 Other fatigue: Secondary | ICD-10-CM | POA: Diagnosis not present

## 2014-10-08 LAB — COMPREHENSIVE METABOLIC PANEL
ALT: 34 U/L (ref 14–54)
AST: 34 U/L (ref 15–41)
Albumin: 4.1 g/dL (ref 3.5–5.0)
Alkaline Phosphatase: 79 U/L (ref 38–126)
Anion gap: 6 (ref 5–15)
BUN: 12 mg/dL (ref 6–20)
CO2: 26 mmol/L (ref 22–32)
Calcium: 8.4 mg/dL — ABNORMAL LOW (ref 8.9–10.3)
Chloride: 104 mmol/L (ref 101–111)
Creatinine, Ser: 1.04 mg/dL — ABNORMAL HIGH (ref 0.44–1.00)
GFR calc Af Amer: >60 mL/min (ref >60)
GFR calc non Af Amer: 57 mL/min — ABNORMAL LOW (ref >60)
Glucose, Bld: 107 mg/dL — ABNORMAL HIGH (ref 65–99)
Potassium: 4 mmol/L (ref 3.5–5.1)
Sodium: 136 mmol/L (ref 135–145)
Total Bilirubin: 0.4 mg/dL (ref 0.3–1.2)
Total Protein: 7.9 g/dL (ref 6.5–8.1)

## 2014-10-08 LAB — CBC WITH DIFFERENTIAL/PLATELET
Basophils Absolute: 0 10*3/uL (ref 0–0.1)
Basophils Relative: 1 %
Eosinophils Absolute: 0 10*3/uL (ref 0–0.7)
Eosinophils Relative: 0 %
HCT: 42.4 % (ref 35.0–47.0)
Hemoglobin: 13.9 g/dL (ref 12.0–16.0)
Lymphocytes Relative: 35 %
Lymphs Abs: 1.2 10*3/uL (ref 1.0–3.6)
MCH: 30.4 pg (ref 26.0–34.0)
MCHC: 32.7 g/dL (ref 32.0–36.0)
MCV: 92.9 fL (ref 80.0–100.0)
Monocytes Absolute: 0.8 10*3/uL (ref 0.2–0.9)
Monocytes Relative: 22 %
Neutro Abs: 1.5 10*3/uL (ref 1.4–6.5)
Neutrophils Relative %: 42 %
Platelets: 246 10*3/uL (ref 150–440)
RBC: 4.57 MIL/uL (ref 3.80–5.20)
RDW: 17.8 % — ABNORMAL HIGH (ref 11.5–14.5)
WBC: 3.5 10*3/uL — ABNORMAL LOW (ref 3.6–11.0)

## 2014-10-08 MED ORDER — SODIUM CHLORIDE 0.9 % IJ SOLN
10.0000 mL | INTRAMUSCULAR | Status: AC | PRN
  Administered 2014-10-08: 10 mL via INTRAVENOUS
  Filled 2014-10-08: qty 10

## 2014-10-08 MED ORDER — SODIUM CHLORIDE 0.9 % IV SOLN
Freq: Once | INTRAVENOUS | Status: AC
  Administered 2014-10-08: 16:00:00 via INTRAVENOUS
  Filled 2014-10-08: qty 4

## 2014-10-08 MED ORDER — INV-ERIBULIN CHEMO INJECTION 1 MG/2ML RU011201I ACCRU
1.4000 mg/m2 | Freq: Once | INTRAVENOUS | Status: AC
  Administered 2014-10-08: 2.7 mg via INTRAVENOUS
  Filled 2014-10-08: qty 5.4

## 2014-10-08 MED ORDER — SODIUM CHLORIDE 0.9 % IJ SOLN
10.0000 mL | INTRAMUSCULAR | Status: DC | PRN
  Filled 2014-10-08: qty 10

## 2014-10-08 MED ORDER — SODIUM CHLORIDE 0.9 % IV SOLN
Freq: Once | INTRAVENOUS | Status: AC
  Administered 2014-10-08: 16:00:00 via INTRAVENOUS
  Filled 2014-10-08: qty 1000

## 2014-10-08 MED ORDER — HEPARIN SOD (PORK) LOCK FLUSH 100 UNIT/ML IV SOLN
500.0000 [IU] | Freq: Once | INTRAVENOUS | Status: AC | PRN
  Administered 2014-10-08: 500 [IU]
  Filled 2014-10-08: qty 5

## 2014-10-08 NOTE — Progress Notes (Signed)
[name] [name] is a 64 y.o. female with recurrent breast cancer who is seen for assessment prior to day 1 of cycle #4 Halaven.  HPI: The patient was last seen in the medical oncology clinic on 09/17/2014.  At that time,  she began cycle #3.  Symptomatically, she noted fatigue. She had 2 days of loose stools (2 episodes each day). Her neuropathy was stable.  Chest wall lesions were smaller.  Two right-sided lesions had healed. She was taking Tramadol 3 times a day (no change).  During the interim, she has done well.  Her fatigue is less.  Appetite remains decreased.  She has lost 3 pounds in the past 3 weeks.  She notes constipation on her recent trip.  She describes a cramping sensation in her stomach.  Her neuropathy is unchanged.  She denies any new chest wall lesions.  Past Medical History  Diagnosis Date  . Personal history of malignant neoplasm of breast   . Breast cancer   . Anemia   . Blood transfusion without reported diagnosis   . Emphysema of lung     Past Surgical History  Procedure Laterality Date  . Oophrectomy  1980  . Abdominal hysterectomy  1995  . Breast surgery Right 1996    mastectomy  . Breast biopsy Left 2014  . Breast biopsy Left 01-21-14    Family History  Problem Relation Age of Onset  . Cancer Father   . Heart disease Sister   . Diabetes Sister   . Heart disease Brother   . Cancer Paternal Grandmother     breast    Social History:  reports that she has been smoking Cigarettes.  She has a 25 pack-year smoking history. She has never used smokeless tobacco. She reports that she does not drink alcohol or use illicit drugs.  She would like to go on a cruise in 12/2014.  The patient is accompanied by Karma Ganja, protocol nurse.  Allergies:  Allergies  Allergen Reactions  . Sulfa Antibiotics Nausea And Vomiting    headache    Current Medications: Current  Outpatient Prescriptions  Medication Sig Dispense Refill  . Calcium Carbonate-Vitamin D (CALCIUM + D PO) Take 2 tablets by mouth daily.    Marland Kitchen KLOR-CON 10 10 MEQ tablet   0  . LORazepam (ATIVAN) 0.5 MG tablet   0  . potassium chloride SA (K-DUR,KLOR-CON) 10 MEQ tablet Take 1 tablet (10 mEq total) by mouth 2 (two) times daily. for 3 days 20 tablet 0  . traMADol (ULTRAM) 50 MG tablet Take 1 tablet (50 mg total) by mouth every 8 (eight) hours as needed. 90 tablet 0   No current facility-administered medications for this visit.   Facility-Administered Medications Ordered in Other Visits  Medication Dose Route Frequency Provider Last Rate Last Dose  . sodium chloride 0.9 % injection 10 mL  10 mL Intracatheter PRN Lequita Asal, MD      . sodium chloride 0.9 % injection 10 mL  10 mL Intracatheter PRN Lequita Asal, MD   10 mL at 08/27/14 1400  . sodium chloride 0.9 % injection 10 mL  10 mL Intravenous PRN Lequita Asal, MD   10 mL at 10/08/14 1335    Review of Systems:  GENERAL:  Fatigue.  No fevers or sweats.  Weight loss of 3 pounds. PERFORMANCE STATUS (ECOG):  1 HEENT:  No visual changes, runny  nose, sore throat, mouth sores or tenderness. Lungs: No shortness of breath or cough.  No hemoptysis. Cardiac:  No chest pain, palpitations, orthopnea, or PND. GI:  Appetite down.  Abdominal cramping.  Constipation.  No nausea, vomiting, diarrhea, melena or hematochezia. GU:  No urgency, frequency, dysuria, or hematuria. Musculoskeletal:  No back pain.  No joint pain.  No muscle tenderness. Extremities:  No pain or swelling. Skin:  Hair thinning.  No rashes or skin changes. Neuro:  Baseline neuropathy pre-treatment.  No headache, numbness or weakness, balance or coordination issues. Endocrine:  No diabetes, thyroid issues, hot flashes or night sweats. Psych:  Insomnia.  No mood changes, depression or anxiety. Pain:  Stable pain treated with Tramadol. Review of systems:  All other  systems reviewed and found to be negative.   Physical Exam: There were no vitals taken for this visit.  GENERAL: Well developed, well nourished, sitting comfortably in the exam room in no acute distress. MENTAL STATUS: Alert and oriented to person, place and time. HEAD: Short brown thinning hair with slight graying. Normocephalic, atraumatic, face symmetric, no Cushingoid features. EYES: Brown eyes. Pupils equal round and reactive to light and accomodation. No conjunctivitis or scleral icterus. ENT: Oropharynx clear without lesion. Tongue normal. Mucous membranes moist.  RESPIRATORY: Clear to auscultation without rales, wheezes or rhonchi. CARDIOVASCULAR: Regular rate and rhythm without murmur, rub or gallop. BREAST: Right chest wall lesions healed. Central upper sternal area remains flat.  Left lateral denuded area triangular in shape  measuring 6.5 x 4.5 cm in greatest dimension, but tapering (arrow head shape). ABDOMEN: Soft, non-tender, with active bowel sounds, and no hepatosplenomegaly. No masses. SKIN: Cutaneous breast cancer noted in breast section. No other lesions.  Long nails. EXTREMITIES: No edema, no skin discoloration or tenderness. No palpable cords. LYMPH NODES: Left axillary ridge slightly less prominent. No palpable cervical, supraclavicular, axillary or inguinal adenopathy  NEUROLOGICAL: Unremarkable. PSYCH: Appropriate.  No visits with results within 3 Day(s) from this visit. Latest known visit with results is:  Infusion on 09/24/2014  Component Date Value Ref Range Status  . WBC 09/24/2014 4.1  3.6 - 11.0 K/uL Final   A-LINE DRAW  . RBC 09/24/2014 4.15  3.80 - 5.20 MIL/uL Final  . Hemoglobin 09/24/2014 12.7  12.0 - 16.0 g/dL Final  . HCT 09/24/2014 38.6  35.0 - 47.0 % Final  . MCV 09/24/2014 93.1  80.0 - 100.0 fL Final  . MCH 09/24/2014 30.5  26.0 - 34.0 pg Final  . MCHC 09/24/2014 32.8  32.0 - 36.0 g/dL Final  . RDW 09/24/2014 17.3* 11.5 -  14.5 % Final  . Platelets 09/24/2014 215  150 - 440 K/uL Final  . Neutrophils Relative % 09/24/2014 32   Final  . Neutro Abs 09/24/2014 1.3* 1.4 - 6.5 K/uL Final  . Lymphocytes Relative 09/24/2014 55   Final  . Lymphs Abs 09/24/2014 2.3  1.0 - 3.6 K/uL Final  . Monocytes Relative 09/24/2014 13   Final  . Monocytes Absolute 09/24/2014 0.5  0.2 - 0.9 K/uL Final  . Eosinophils Relative 09/24/2014 0   Final  . Eosinophils Absolute 09/24/2014 0.0  0 - 0.7 K/uL Final  . Basophils Relative 09/24/2014 0   Final  . Basophils Absolute 09/24/2014 0.0  0 - 0.1 K/uL Final  . Sodium 09/24/2014 137  135 - 145 mmol/L Final  . Potassium 09/24/2014 3.2* 3.5 - 5.1 mmol/L Final  . Chloride 09/24/2014 105  101 - 111 mmol/L Final  . CO2  09/24/2014 25  22 - 32 mmol/L Final  . Glucose, Bld 09/24/2014 84  65 - 99 mg/dL Final  . BUN 09/24/2014 10  6 - 20 mg/dL Final  . Creatinine, Ser 09/24/2014 0.74  0.44 - 1.00 mg/dL Final  . Calcium 09/24/2014 8.6* 8.9 - 10.3 mg/dL Final  . Total Protein 09/24/2014 7.4  6.5 - 8.1 g/dL Final  . Albumin 09/24/2014 3.9  3.5 - 5.0 g/dL Final  . AST 09/24/2014 30  15 - 41 U/L Final  . ALT 09/24/2014 28  14 - 54 U/L Final  . Alkaline Phosphatase 09/24/2014 69  38 - 126 U/L Final  . Total Bilirubin 09/24/2014 0.3  0.3 - 1.2 mg/dL Final  . GFR calc non Af Amer 09/24/2014 >60  >60 mL/min Final  . GFR calc Af Amer 09/24/2014 >60  >60 mL/min Final   Comment: (NOTE) The eGFR has been calculated using the CKD EPI equation. This calculation has not been validated in all clinical situations. eGFR's persistently <60 mL/min signify possible Chronic Kidney Disease.   . Anion gap 09/24/2014 7  5 - 15 Final    Assessment:  [name] [name] is a 64 y.o. African American woman with a history of stage I right breast cancer s/p modified radical mastectomy on 06/12/1996. Pathology revealed a grade III mutifocal infiltrating ductal carcinoma (tumor size 8.5 cm with an invasive component 1.6  cm). Twenty-one lymph nodes were negative. Tumor was ER/PR negative. She received 4 cycles of AC at [facility].  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 PET scan on 05/22/2012 revealed multiple areas of disease. She received 6 cycles of Taxotere and Cytoxan (TC) from 06/06/2012 - 09/29/2012. PET scan on 09/07/2012 noted marked improvement. Post chemotherapy biopsy was positive. She was not a surgical candidate. She was placed on Femara.  PET scan on 05/03/2013 revealed a new focus along lateral left breast and a new level II cervical lymph node. She received radiation from 09/13/2013 - 11/05/2013. PET scan on 01/01/2014 revealed new nodule medial left chest wall pleural/paraspinal activity LUL. Multiple biopsies on 01/19/2014 were positive. She began daily letrozole and Ibrance on 02/12/2014. Despite treatment, new nodules formed. She received a total of 2 cycles.   PET scan on 05/09/2014 revealed marked progression of disease with multifocal masses in chest wall (left > right). She did not receive Imbrance in 04/2014. CA27.29 has increased: 22.9 on 01/08/2014, 54 on 03/05/2014, 170.8 on 05/06/2014, 250.8 on 05/23/2014, 445.8 on 06/14/2014, and 531.1 on 07/01/2104.   She received 3 cycles of Xeloda (02/12 - 07/05/2014). Chest and abdomen CT scan on 07/05/2014 revealed persistent and slightly progressive diffuse locally invasive chest wall tumor. Bone scan on 07/25/2014 revealed no evidence of metastatic disease.   She is status post 3 cycles of Halaven (08/06/2014 - 09/17/2014) on ACCRU [id number] I. Cycle #1 was notable for an ANC of 200 on day 15. Cycle #2 was notable for grade II fatigue, brief grade I diarrhea, and decreased appetite.  Cycle #3 was notable for grade I fatigue, anorexia, and weight loss.  Symptomatically, she remains fatigued. She continues to slowly lose  weight.  Exam reveals resolution of right chest wall lesions, stable flat superior sternal lesion, and a stable large triangle shaped denuded area in the left lateral chest wall.   Plan: 1. Labs today: CBC with diff, CMP. 2. Day 1 of cycle #4 Halaven. 3. Medical photographs. 4. RTC  in 1 week for labs and day 8 of cycle #4 Halaven. 5. Refill Tramadol. 6. Schedule chest, abdomen, pelvic CT scan. 7. RTC in 3 weeks for MD assessment, labs (CBC with diff, CMP, CA27.29), review of restaging studies, and cycle #5 Halaven.   Lequita Asal, MD

## 2014-10-08 NOTE — Progress Notes (Signed)
Ms. Grethel Zenk returns to clinic this afternoon for consideration of cycle 4, day 1 Eribulin infusion. Reports she has been doing well since her last visit. She completed her phone survey - PRO-CTCAE on 10/05/14. All patient reported outcomes were reviewed with patient.Reports some constipation in the past week, which lasted 4 days and corrected itself without use of a laxative. Appetite remains decreased, and patient reports she is eating about 50% of what she used to, but is supplementing with some Ensure daily. She has lost a total of 4.2kg since beginning Eribulin, which is now a grade 1 weight loss per CTCAE v4.0. Fatigue still present, but improved a little since last cycle. Insomnia and Peripheral neuropathy are about the same. Reports a new pain in her lower abdomin, which is a sharp, shooting pain and makes her feel like she is going to have diarrhea, but then goes away. Denies that this is related to ny episode of diarrhea or constipation. [name] questions whether is may be just intestinal gas since it resolves so quickly on its own. Her CBC and Chemistry results are all within acceptable parameters for her treatment today. B/P slightly elevated at 141/92, and VS otherwise stable. Patient has completed her Patient Questionnaire booklet for the study while in the clinic. Open lesions on chest have completely resolved now, and photos were taken of this and one remaining lesion. Will proceed with C4D1 Eribulin. Follow up CT has been scheduled for 10/24/14. Current AEs with CTCAE grade and attributions as follows:     Decreased appetite - grade 1; definitely attributed   Fatigue - grade 1; deifnitely attributed   Alopecia - grade 1; definitely attributed   Insomnia - grade 1; unlikely related   Peripheral Neuropathy - grade 1; not attributed   Pain - grade 1; not attributed   Constipation - grade 1; possibly attributed   Weight  loss(5%) - grade 1; definitely related

## 2014-10-09 ENCOUNTER — Ambulatory Visit: Payer: Self-pay | Admitting: Radiation Oncology

## 2014-10-15 ENCOUNTER — Encounter: Payer: Self-pay | Admitting: Hematology and Oncology

## 2014-10-15 ENCOUNTER — Inpatient Hospital Stay: Payer: Medicare Other

## 2014-10-15 ENCOUNTER — Encounter: Payer: Self-pay | Admitting: *Deleted

## 2014-10-15 ENCOUNTER — Inpatient Hospital Stay: Payer: Medicare Other | Attending: Hematology and Oncology

## 2014-10-15 VITALS — BP 118/75 | HR 87 | Temp 98.2°F | Resp 20

## 2014-10-15 DIAGNOSIS — F17210 Nicotine dependence, cigarettes, uncomplicated: Secondary | ICD-10-CM | POA: Diagnosis not present

## 2014-10-15 DIAGNOSIS — G629 Polyneuropathy, unspecified: Secondary | ICD-10-CM | POA: Insufficient documentation

## 2014-10-15 DIAGNOSIS — Z171 Estrogen receptor negative status [ER-]: Secondary | ICD-10-CM | POA: Insufficient documentation

## 2014-10-15 DIAGNOSIS — Z5111 Encounter for antineoplastic chemotherapy: Secondary | ICD-10-CM | POA: Insufficient documentation

## 2014-10-15 DIAGNOSIS — C50912 Malignant neoplasm of unspecified site of left female breast: Secondary | ICD-10-CM | POA: Diagnosis not present

## 2014-10-15 DIAGNOSIS — Z79899 Other long term (current) drug therapy: Secondary | ICD-10-CM | POA: Insufficient documentation

## 2014-10-15 LAB — COMPREHENSIVE METABOLIC PANEL
ALT: 26 U/L (ref 14–54)
AST: 34 U/L (ref 15–41)
Albumin: 3.9 g/dL (ref 3.5–5.0)
Alkaline Phosphatase: 71 U/L (ref 38–126)
Anion gap: 7 (ref 5–15)
BUN: 12 mg/dL (ref 6–20)
CO2: 25 mmol/L (ref 22–32)
Calcium: 8.2 mg/dL — ABNORMAL LOW (ref 8.9–10.3)
Chloride: 104 mmol/L (ref 101–111)
Creatinine, Ser: 0.99 mg/dL (ref 0.44–1.00)
GFR calc Af Amer: >60 mL/min (ref >60)
GFR calc non Af Amer: >60 mL/min (ref >60)
Glucose, Bld: 130 mg/dL — ABNORMAL HIGH (ref 65–99)
Potassium: 3.1 mmol/L — ABNORMAL LOW (ref 3.5–5.1)
Sodium: 136 mmol/L (ref 135–145)
Total Bilirubin: 0.6 mg/dL (ref 0.3–1.2)
Total Protein: 7.2 g/dL (ref 6.5–8.1)

## 2014-10-15 LAB — CBC WITH DIFFERENTIAL/PLATELET
Basophils Absolute: 0 10*3/uL (ref 0–0.1)
Basophils Relative: 1 %
Eosinophils Absolute: 0 10*3/uL (ref 0–0.7)
Eosinophils Relative: 0 %
HCT: 38.1 % (ref 35.0–47.0)
Hemoglobin: 12.5 g/dL (ref 12.0–16.0)
Lymphocytes Relative: 48 %
Lymphs Abs: 1.4 10*3/uL (ref 1.0–3.6)
MCH: 30.3 pg (ref 26.0–34.0)
MCHC: 32.9 g/dL (ref 32.0–36.0)
MCV: 92.3 fL (ref 80.0–100.0)
Monocytes Absolute: 0.4 10*3/uL (ref 0.2–0.9)
Monocytes Relative: 12 %
Neutro Abs: 1.2 10*3/uL — ABNORMAL LOW (ref 1.4–6.5)
Neutrophils Relative %: 39 %
Platelets: 204 10*3/uL (ref 150–440)
RBC: 4.13 MIL/uL (ref 3.80–5.20)
RDW: 17.6 % — ABNORMAL HIGH (ref 11.5–14.5)
WBC: 3 10*3/uL — ABNORMAL LOW (ref 3.6–11.0)

## 2014-10-15 MED ORDER — HEPARIN SOD (PORK) LOCK FLUSH 100 UNIT/ML IV SOLN
500.0000 [IU] | Freq: Once | INTRAVENOUS | Status: AC | PRN
  Administered 2014-10-15: 500 [IU]
  Filled 2014-10-15: qty 5

## 2014-10-15 MED ORDER — SODIUM CHLORIDE 0.9 % IV SOLN
Freq: Once | INTRAVENOUS | Status: AC
  Administered 2014-10-15: 16:00:00 via INTRAVENOUS
  Filled 2014-10-15: qty 4

## 2014-10-15 MED ORDER — INV-ERIBULIN CHEMO INJECTION 1 MG/2ML RU011201I ACCRU
1.4000 mg/m2 | Freq: Once | INTRAVENOUS | Status: AC
  Administered 2014-10-15: 2.7 mg via INTRAVENOUS
  Filled 2014-10-15: qty 5.4

## 2014-10-15 MED ORDER — SODIUM CHLORIDE 0.9 % IV SOLN
Freq: Once | INTRAVENOUS | Status: AC
  Administered 2014-10-15: 16:00:00 via INTRAVENOUS
  Filled 2014-10-15: qty 1000

## 2014-10-15 NOTE — Progress Notes (Signed)
ACCRU Protocol patient: Cycle4/Day 8- [name] [name] arrives in clinic today for treatment with Eribulin.  She reports that she has completed her phone survey-PRO-CTCAE and we reviewed her responses. She report no constipation, mouth or throat sores, and no vomiting.  She continues to have a decreased appetite but this is intermittent. Some days she eats really well and other days food does not appeal to her.  She continues Ensure supplements. She reports and increase in her fatigue that peaks about two days after treatment and rebounds after about 48 hours. When questioned she says that her fatigue responds to short periods of rest making this a Grade 1 AE.  She continues to ambulate, drive, and care for herself so her ADL's are not compromised. She had one night this week where she could not sleep but she does not take any medication for this.   She reports frequent stools and clarified that this means she had one stool per day. She continues to have fleeting  abdominal pain unrelated to diarrhea. This does not interfered with her ADL's.  She reports the numbness and tingling of her hands and feet is unchanged since her last visit but she now notices some numbness of her left leg from mid-calf up to the knee.  The numbness wraps around her leg but  does not inhibit her activity in any way.  Her CBC and CA 27.29 have been drawn and her neutrophil count is 1.1 ( the threshold for holding treatment is < 1,000). He potassium is 3.1 ( Grade 1) and [name] has ordered 10 meq Potassium BID X 3 days. Her calcium is 8.2 and she takes a daily supplement. This information has been reviewed with  [name] and the patient has been cleared for today's treatment.   Hypokalemia- Grade 1; not attributed as intermittent hypokalemia predated clinical trial Hypocalcemia-Grade 1; not attributed as hypocalcemia predated clinical trial Decreased appetite- grade 1; definitely attributed Fatigue-relieved with rest-grade 1;  definitely attributed Alopecia-grade 1; definitely attributed Insomnia- grade 1; unlikely related Peripheral neuropathy-grade 1; not attributed Pain- grade 1; not attributed Constipation- grade 1 resolved Neutropenia- grade 2; definitely related.  Weight loss (5%)- grade 1; definitely related.   Jake Samples, RN

## 2014-10-16 ENCOUNTER — Telehealth: Payer: Self-pay

## 2014-10-16 LAB — CANCER ANTIGEN 27.29: CA 27.29: 51.7 U/mL — ABNORMAL HIGH (ref 0.0–38.6)

## 2014-10-16 NOTE — Telephone Encounter (Signed)
Called pt to inform her about her tumor markers going from 339 to 51.7 which was good news; pt very appreciative of the call

## 2014-10-17 ENCOUNTER — Other Ambulatory Visit: Payer: Self-pay | Admitting: *Deleted

## 2014-10-17 ENCOUNTER — Other Ambulatory Visit: Payer: Self-pay

## 2014-10-17 MED ORDER — KLOR-CON 10 10 MEQ PO TBCR: 10.0000 meq | EXTENDED_RELEASE_TABLET | Freq: Every day | ORAL | Status: DC

## 2014-10-17 NOTE — Telephone Encounter (Signed)
Refill sent to pharmacy.   

## 2014-10-21 ENCOUNTER — Encounter: Payer: Medicare Other | Attending: Surgery | Admitting: Surgery

## 2014-10-21 DIAGNOSIS — C50812 Malignant neoplasm of overlapping sites of left female breast: Secondary | ICD-10-CM | POA: Insufficient documentation

## 2014-10-21 DIAGNOSIS — S21002A Unspecified open wound of left breast, initial encounter: Secondary | ICD-10-CM | POA: Diagnosis not present

## 2014-10-21 DIAGNOSIS — X58XXXA Exposure to other specified factors, initial encounter: Secondary | ICD-10-CM | POA: Diagnosis not present

## 2014-10-21 DIAGNOSIS — C50412 Malignant neoplasm of upper-outer quadrant of left female breast: Secondary | ICD-10-CM | POA: Insufficient documentation

## 2014-10-21 DIAGNOSIS — S21001A Unspecified open wound of right breast, initial encounter: Secondary | ICD-10-CM | POA: Diagnosis not present

## 2014-10-22 NOTE — Progress Notes (Addendum)
[name] [name], [name] [name] ([id number]) Visit Report for 10/21/2014 Arrival Information Details Patient Name: [name] [name] Date of Service: 10/21/2014 8:15 AM Medical Record Number: [id number] Patient Account Number: [id number] Date of Birth/Sex: 1950-06-18 (63 y.o. Female) Treating RN: [name] [name] Primary Care Physician: PATIENT, NO Other Clinician: Referring Physician: Treating Physician/Extender: [name] [name] in Treatment: 14 Visit Information History Since Last Visit Added or deleted any medications: No Patient Arrived: Ambulatory Any new allergies or adverse reactions: No Arrival Time: 08:29 Had a fall or experienced change in No Accompanied By: self activities of daily living that may affect Transfer Assistance: None risk of falls: Patient Identification Verified: Yes Signs or symptoms of abuse/neglect since last No Secondary Verification Process Yes visito Completed: Hospitalized since last visit: No Patient Requires Transmission-Based No Pain Present Now: No Precautions: Patient Has Alerts: No Electronic Signature(s) Signed: 10/21/2014 5:31:46 PM By: [name] [name] Entered By: [name] [name] on 10/21/2014 08:30:02 [name] [name] ([id number]) -------------------------------------------------------------------------------- Clinic Level of Care Assessment Details Patient Name: [name] [name] Date of Service: 10/21/2014 8:15 AM Medical Record Number: [id number] Patient Account Number: [id number] Date of Birth/Sex: 03/30/1951 (63 y.o. Female) Treating RN: [name] [name] Primary Care Physician: PATIENT, NO Other Clinician: Referring Physician: Treating Physician/Extender: [name] [name] in Treatment: 14 Clinic Level of Care Assessment Items TOOL 4 Quantity Score []  - Use when only an EandM is performed on FOLLOW-UP visit 0 ASSESSMENTS - Nursing Assessment / Reassessment X - Reassessment of Co-morbidities (includes updates in patient status) 1 10 X -  Reassessment of Adherence to Treatment Plan 1 5 ASSESSMENTS - Wound and Skin Assessment / Reassessment X - Simple Wound Assessment / Reassessment - one wound 1 5 []  - Complex Wound Assessment / Reassessment - multiple wounds 0 []  - Dermatologic / Skin Assessment (not related to wound area) 0 ASSESSMENTS - Focused Assessment []  - Circumferential Edema Measurements - multi extremities 0 []  - Nutritional Assessment / Counseling / Intervention 0 []  - Lower Extremity Assessment (monofilament, tuning fork, pulses) 0 []  - Peripheral Arterial Disease Assessment (using hand held doppler) 0 ASSESSMENTS - Ostomy and/or Continence Assessment and Care []  - Incontinence Assessment and Management 0 []  - Ostomy Care Assessment and Management (repouching, etc.) 0 PROCESS - Coordination of Care X - Simple Patient / Family Education for ongoing care 1 15 []  - Complex (extensive) Patient / Family Education for ongoing care 0 []  - Staff obtains Programmer, systems, Records, Test Results / Process Orders 0 []  - Staff telephones HHA, Nursing Homes / Clarify orders / etc 0 []  - Routine Transfer to another Facility (non-emergent condition) 0 [name], [name] []  - Routine Hospital Admission (non-emergent condition) 0 []  - New Admissions / Biomedical engineer / Ordering NPWT, Apligraf, etc. 0 []  - Emergency Hospital Admission (emergent condition) 0 X - Simple Discharge Coordination 1 10 []  - Complex (extensive) Discharge Coordination 0 PROCESS - Special Needs []  - Pediatric / Minor Patient Management 0 []  - Isolation Patient Management 0 []  - Hearing / Language / Visual special needs 0 []  - Assessment of Community assistance (transportation, D/C planning, etc.) 0 []  - Additional assistance / Altered mentation 0 []  - Support Surface(s) Assessment (bed, cushion, seat, etc.) 0 INTERVENTIONS - Wound Cleansing / Measurement X - Simple Wound Cleansing - one wound 1 5 []  - Complex Wound Cleansing - multiple  wounds 0 X - Wound Imaging (photographs - any number of wounds) 1 5 []  - Wound Tracing (instead of photographs) 0 X - Simple Wound Measurement -  one wound 1 5 []  - Complex Wound Measurement - multiple wounds 0 INTERVENTIONS - Wound Dressings X - Small Wound Dressing one or multiple wounds 1 10 []  - Medium Wound Dressing one or multiple wounds 0 []  - Large Wound Dressing one or multiple wounds 0 []  - Application of Medications - topical 0 []  - Application of Medications - injection 0 INTERVENTIONS - Miscellaneous []  - External ear exam 0 [name], [name] ([id number]) []  - Specimen Collection (cultures, biopsies, blood, body fluids, etc.) 0 []  - Specimen(s) / Culture(s) sent or taken to Lab for analysis 0 []  - Patient Transfer (multiple staff / Harrel Lemon Lift / Similar devices) 0 []  - Simple Staple / Suture removal (25 or less) 0 []  - Complex Staple / Suture removal (26 or more) 0 []  - Hypo / Hyperglycemic Management (close monitor of Blood Glucose) 0 []  - Ankle / Brachial Index (ABI) - do not check if billed separately 0 X - Vital Signs 1 5 Has the patient been seen at the hospital within the last three years: Yes Total Score: 75 Level Of Care: New/Established - Level 2 Electronic Signature(s) Signed: 10/21/2014 5:31:46 PM By: [name] [name] Entered By: [name] [name] on 10/21/2014 08:44:02 [name] [name] ([id number]) -------------------------------------------------------------------------------- Complex / Palliative Patient Assessment Details Patient Name: [name] [name] Date of Service: 10/21/2014 8:15 AM Medical Record Number: [id number] Patient Account Number: [id number] Date of Birth/Sex: 10/28/1950 (63 y.o. Female) Treating RN: [name] [name] Primary Care Physician: PATIENT, NO Other Clinician: Referring Physician: Treating Physician/Extender: [name] [name] in Treatment: 14 Palliative Management Criteria Complex Wound Management Criteria Patient has remarkable or  complex co-morbidities requiring medications or treatments that extend wound healing times. Examples: o Diabetes mellitus with chronic renal failure or end stage renal disease requiring dialysis o Advanced or poorly controlled rheumatoid arthritis o Diabetes mellitus and end stage chronic obstructive pulmonary disease o Active cancer with current chemo- or radiation therapy Breast Cancer Chemo Care Approach Wound Care Plan: Complex Wound Management Electronic Signature(s) Signed: 10/24/2014 12:22:30 PM By: Christin Fudge MD, FACS Signed: 10/24/2014 5:55:09 PM By: Gretta Cool, RN, BSN, Kim RN, BSN Entered By: Gretta Cool, RN, BSN, Kim on 10/24/2014 11:22:38 [name] [name] ([id number]) -------------------------------------------------------------------------------- Encounter Discharge Information Details Patient Name: [name] [name] Date of Service: 10/21/2014 8:15 AM Medical Record Number: [id number] Patient Account Number: [id number] Date of Birth/Sex: 12-29-1950 (63 y.o. Female) Treating RN: [name] [name] Primary Care Physician: PATIENT, NO Other Clinician: Referring Physician: Treating Physician/Extender: [name] [name] in Treatment: 14 Encounter Discharge Information Items Discharge Pain Level: 0 Discharge Condition: Stable Ambulatory Status: Ambulatory Discharge Destination: Home Private Transportation: Auto Accompanied By: self Schedule Follow-up Appointment: Yes Medication Reconciliation completed and No provided to Patient/Care Darrow Barreiro: Clinical Summary of Care: Electronic Signature(s) Signed: 10/21/2014 5:31:46 PM By: [name] [name] Entered By: [name] [name] on 10/21/2014 08:44:58 [name] [name] ([id number]) -------------------------------------------------------------------------------- Multi Wound Chart Details Patient Name: [name] [name] Date of Service: 10/21/2014 8:15 AM Medical Record Number: [id number] Patient Account Number: [id number] Date  of Birth/Sex: 1950-04-29 (63 y.o. Female) Treating RN: [name] [name] Primary Care Physician: PATIENT, NO Other Clinician: Referring Physician: Treating Physician/Extender: [name] [name] in Treatment: 14 Vital Signs Height(in): 68 Pulse(bpm): 98 Weight(lbs): 180.5 Blood Pressure 112/73 (mmHg): Body Mass Index(BMI): 27 Temperature(F): 98.4 Respiratory Rate 18 (breaths/min): Wound Assessments Treatment Notes Electronic Signature(s) Signed: 10/21/2014 5:31:46 PM By: [name] [name] Entered By: [name] [name] on 10/21/2014 08:31:27 [name] [name] ([id number]) -------------------------------------------------------------------------------- [place] Details Patient Name: [name] [name] Date of  Service: 10/21/2014 8:15 AM Medical Record Number: [id number] Patient Account Number: [id number] Date of Birth/Sex: December 05, 1950 (64 y.o. Female) Treating RN: [name] [name] Primary Care Physician: PATIENT, NO Other Clinician: Referring Physician: Treating Physician/Extender: [name] [name] in Treatment: 14 Active Inactive Abuse / Safety / Falls / Self Care Management Nursing Diagnoses: Potential for falls Goals: Patient will remain injury free Date Initiated: 07/11/2014 Goal Status: Active Interventions: Assess fall risk on admission and as needed Notes: Malignancy/Atypical Etiology Nursing Diagnoses: Knowledge deficit related to disease process and management of atypical ulcer etiology Goals: Patient/caregiver will verbalize understanding of disease process and disease management of atypical ulcer etiology Date Initiated: 07/11/2014 Goal Status: Active Interventions: Provide education on atypical ulcer etiologies Notes: Nutrition Nursing Diagnoses: Potential for alteratiion in Nutrition/Potential for imbalanced nutrition Goals: ANNMARIE, PLEMMONS ([id number]) Patient/caregiver agrees to and verbalizes understanding of need to use nutritional  supplements and/or vitamins as prescribed Date Initiated: 07/11/2014 Goal Status: Active Interventions: Assess patient nutrition upon admission and as needed per policy Notes: Orientation to the Wound Care Program Nursing Diagnoses: Knowledge deficit related to the wound healing center program Goals: Patient/caregiver will verbalize understanding of the [place] Program Date Initiated: 07/11/2014 Goal Status: Active Interventions: Provide education on orientation to the wound center Notes: Wound/Skin Impairment Nursing Diagnoses: Impaired tissue integrity Goals: Patient will demonstrate a reduced rate of smoking or cessation of smoking Date Initiated: 07/11/2014 Goal Status: Active Patient will have a decrease in wound volume by X% from date: (specify in notes) Date Initiated: 07/11/2014 Goal Status: Active Patient/caregiver will verbalize understanding of skin care regimen Date Initiated: 07/11/2014 Goal Status: Active Ulcer/skin breakdown will have a volume reduction of 30% by week 4 Date Initiated: 07/11/2014 Goal Status: Active Ulcer/skin breakdown will have a volume reduction of 50% by week 8 Date Initiated: 07/11/2014 Goal Status: Active Ulcer/skin breakdown will have a volume reduction of 80% by week 12 AVIELLE, IMBERT ([id number]) Date Initiated: 07/11/2014 Goal Status: Active Ulcer/skin breakdown will heal within 14 weeks Date Initiated: 07/11/2014 Goal Status: Active Interventions: Assess patient/caregiver ability to obtain necessary supplies Assess patient/caregiver ability to perform ulcer/skin care regimen upon admission and as needed Assess ulceration(s) every visit Provide education on smoking Provide education on ulcer and skin care Screen for HBO Notes: Electronic Signature(s) Signed: 10/21/2014 5:31:46 PM By: [name] [name] Entered By: [name] [name] on 10/21/2014 08:31:04 [name] [name]  ([id number]) -------------------------------------------------------------------------------- Patient/Caregiver Education Details Patient Name: [name] [name] Date of Service: 10/21/2014 8:15 AM Medical Record Number: [id number] Patient Account Number: [id number] Date of Birth/Gender: 26-Nov-1950 (63 y.o. Female) Treating RN: [name] [name] Primary Care Physician: PATIENT, NO Other Clinician: Referring Physician: Treating Physician/Extender: [name] [name] in Treatment: 14 Education Assessment Education Provided To: Patient Education Topics Provided Wound/Skin Impairment: Handouts: Other: wound care as ordered Methods: Demonstration, Explain/Verbal Responses: State content correctly Electronic Signature(s) Signed: 10/21/2014 5:31:46 PM By: [name] [name] Entered By: [name] [name] on 10/21/2014 08:32:14 [name] [name] ([id number]) -------------------------------------------------------------------------------- Wound Assessment Details Patient Name: [name] [name] Date of Service: 10/21/2014 8:15 AM Medical Record Number: [id number] Patient Account Number: [id number] Date of Birth/Sex: [birth year] (63 y.o. Female) Treating RN: [name] [name] Primary Care Physician: PATIENT, NO Other Clinician: Referring Physician: Treating Physician/Extender: [name] [name] in Treatment: 14 Wound Status Wound Number: 1 Primary Malignant Wound Etiology: Wound Location: Left Chest Wound Status: Open Wounding Event: Other Lesion Comorbid Received Chemotherapy, Received Date Acquired: 05/10/2014 History: Radiation Weeks Of Treatment: 14 Clustered Wound: No Photos Photo Uploaded By: Gretta Cool, RN, BSN,  Kim on 10/21/2014 17:21:07 Wound Measurements Length: (cm) 4.1 Width: (cm) 6 Depth: (cm) 0.1 Area: (cm) 19.321 Volume: (cm) 1.932 % Reduction in Area: 18% % Reduction in Volume: 59% Epithelialization: None Tunneling: No Undermining: No Wound Description Full Thickness  Without Exposed Classification: Support Structures Wound Margin: Distinct, outline attached Exudate Small Amount: Exudate Type: Serosanguineous Exudate Color: red, brown Foul Odor After Cleansing: No Wound Bed Granulation Amount: Medium (34-66%) Exposed Structure Granulation Quality: Red Fascia Exposed: No Necrotic Amount: Medium (34-66%) Fat Layer Exposed: No Kingsford, Tonye W. ([id number]) Necrotic Quality: Adherent Slough Tendon Exposed: No Muscle Exposed: No Joint Exposed: No Bone Exposed: No Limited to Skin Breakdown Periwound Skin Texture Texture Color No Abnormalities Noted: No No Abnormalities Noted: Yes Callus: No Temperature / Pain Crepitus: No Temperature: No Abnormality Excoriation: No Fluctuance: No Friable: No Induration: Yes Localized Edema: No Rash: No Scarring: Yes Moisture No Abnormalities Noted: Yes Wound Preparation Ulcer Cleansing: Rinsed/Irrigated with Saline Topical Anesthetic Applied: None Treatment Notes Wound #1 (Left Chest) 1. Cleansed with: Clean wound with Normal Saline 3. Peri-wound Care: Skin Prep 4. Dressing Applied: Aquacel Ag 5. Secondary Dressing Applied Bordered Foam Dressing Electronic Signature(s) Signed: 10/21/2014 5:31:46 PM By: [name] [name] Entered By: [name] [name] on 10/21/2014 08:39:53 [name] [name] ([id number]) -------------------------------------------------------------------------------- [place] Details Patient Name: [name] [name] Date of Service: 10/21/2014 8:15 AM Medical Record Number: [id number] Patient Account Number: [id number] Date of Birth/Sex: April 13, 1950 (63 y.o. Female) Treating RN: [name] [name] Primary Care Physician: PATIENT, NO Other Clinician: Referring Physician: Treating Physician/Extender: [name] [name] in Treatment: 14 Vital Signs Time Taken: 08:30 Temperature (F): 98.4 Height (in): 68 Pulse (bpm): 98 Weight (lbs): 180.5 Respiratory Rate (breaths/min): 18 Body  Mass Index (BMI): 27.4 Blood Pressure (mmHg): 112/73 Reference Range: 80 - 120 mg / dl Electronic Signature(s) Signed: 10/21/2014 5:31:46 PM By: [name] [name] Entered By: [name] [name] on 10/21/2014 08:30:30

## 2014-10-22 NOTE — Progress Notes (Signed)
ARDENE, REMLEY ([id number]) Visit Report for 10/21/2014 Chief Complaint Document Details Patient Name: [name] [name] 10/21/2014 8:15 Date of Service: AM Medical Record [id number] Number: Patient Account Number: [id number] [birth year] (64 y.o. Treating RN: Date of Birth/Sex: Female) Other Clinician: Primary Care Physician: PATIENT, NO Treating Jeanann Balinski Referring Physician: Physician/Extender: Suella Grove in Treatment: 14 Information Obtained from: Patient Chief Complaint Patient presents to the wound care center for a consult due non healing wound patient is a self-referral who comes with a history of having open wounds to her left chest wall and right chest wall for about 2 months. 07/18/2014 -- since last week the patient has had no complaints and is on her last cycle of chemotherapy. I have reviewed 155 pages of her reports from a medical oncologist and the summary is made in the history of present illness. Electronic Signature(s) Signed: 10/21/2014 8:51:54 AM By: Christin Fudge MD, FACS Entered By: Christin Fudge on 10/21/2014 08:51:54 [name] [name] ([id number]) -------------------------------------------------------------------------------- HPI Details Patient Name: [name] [name], [name] [name] 10/21/2014 8:15 Date of Service: AM Medical Record [id number] Number: Patient Account Number: [id number] December 08, 1950 (64 y.o. Treating RN: Date of Birth/Sex: Female) Other Clinician: Primary Care Physician: PATIENT, NO Treating [name] [name] Referring Physician: Physician/Extender: Suella Grove in Treatment: 14 History of Present Illness HPI Description: this 64 year old patient has come by herself today as a self-referral and has no documentation with her. She has had open wounds to her left chest and the right chest wall for about 2 months. She is not a diabetic and has no significant medical history except breast cancer. Her right breast cancer was diagnosed 18 years ago and she had a  mastectomy and chemotherapy. 3 years ago she was then diagnosed with a breast cancer of the left side which was advanced and could not be operated and was given chemotherapy for a year and then followed by radiation. Her last radiation was over a year ago. She has not had any biopsy done recently from the ulcerated area but she says there have been other biopsies done from the chest wall and these reports are not available at the present time. She was told to keep the wounds open and let them dry out but she is finding it's more and more difficult to stop soiling her clothes if she leaves wounds open. She is on chemotherapy and we will try and obtain these notes from her oncologist. She is not in pain and does not have any significant problems except the discomfort from an open wound on the chest wall. 07/18/2014 This is a summary of [name] [name] date of birth 1950/12/26. These are obtained by reviewing 151 pages of medical records which were obtained from the [facility] 64 year old patient who had a history of stage I right breast cancer status post modified radical mastectomy on 06/12/1996. 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 She received 4 cycles of AC at [facility]. In January 2014 she presented with left breast swelling and apparently cellulitis. Breast biopsy done revealed lobular carcinoma which was ER and PR positive and HER-2/neu negative. On PET scan and she was shown to have multiple areas of disease left breast, axilla and supraclavicular areas. She received 6 cycles of chemotherapy and then received Femara because she was not a surgical candidate. PET scan also revealed disease in the left lateral breast and new cervical lymph nodes at level II. The patient was then sent for radiation therapy and received this  over a month. In January 2016 she had a PET CT scan done which was compatible with marked progression  of disease with multifocal soft tissue masses in the chest wall left greater than right. She is currently receiving Xeloda and is noted to have disease in the chest wall which is widely present. Her most recent lab work from 07/02/2014 shows that her CMP is within normal limits with a serum albumin of 4.4. The previous CBC was within normal limits with a WBC count of 4.3 hemoglobin of 14 hematocrit of 40.9 and platelets of 202. 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 We will continue with palliative care and make her comfortable as much as possible as far as her wound care goes. Present time there is no odor but if this does occur we would use carbon- based products to mask the odor. [name] [name], [name] [name] ([id number]) Notes were also reviewed from her surgeon Dr. Hervey Ard --closure on 05/27/2014 for breast biopsy in view of the fact that she had extensive disease on her chest wall. Biopsies are taken from the nodular metastatic disease overlying the sternum and nodules chosen for biopsy. Punch biopsies were taken and the pathology on this confirmed that this was consistent with carcinoma 08/15/2014 -- she is started on a new course of injectable chemotherapy and this is 2 weeks on and 2 weeks off. She has no new symptoms no bleeding from the wounds nor is there any odor. She is doing fine otherwise. 09/16/2014. She is doing very well and the right side wounds have completely healed. She has finished 2 cycles of chemotherapy and she is 2 weeks on and 2 weeks off. He is to restart her chemotherapy this week. No fresh complaints pain is within control and there is no odor from the wounds. 10/21/2014 -- overall she is doing very well and is on a clinical trial with her medical oncologist. No  issues with the wound on her left side and there is no odor or drainage. Electronic Signature(s) Signed: 10/21/2014 8:52:32 AM By: Christin Fudge MD, FACS Entered By: Christin Fudge on 10/21/2014 08:52:32 [name] [name] ([id number]) -------------------------------------------------------------------------------- Physical Exam Details Patient Name: SHAHAD, [name] [name] 10/21/2014 8:15 Date of Service: AM Medical Record [id number] Number: Patient Account Number: [id number] Jan 24, 1951 (64 y.o. Treating RN: Date of Birth/Sex: Female) Other Clinician: Primary Care Physician: PATIENT, NO Treating Thula Stewart Referring Physician: Physician/Extender: Suella Grove in Treatment: 14 Constitutional . Pulse regular. Respirations normal and unlabored. Afebrile. . Eyes Nonicteric. Reactive to light. Ears, Nose, Mouth, and Throat Lips, teeth, and gums WNL.Marland Kitchen Moist mucosa without lesions . Neck supple and nontender. No palpable supraclavicular or cervical adenopathy. Normal sized without goiter. Respiratory WNL. No retractions.. Cardiovascular Pedal Pulses WNL. No clubbing, cyanosis or edema. Chest she has significant thickening of the breast on the left side and open ulceration on the left axillary area which has minimal slough but the rest of it is nice and clean. Right chest wall is completely healed.. Breast tissue WNL, no masses, lumps, or tenderness.. Musculoskeletal Adexa without tenderness or enlargement.. Digits and nails w/o clubbing, cyanosis, infection, petechiae, ischemia, or inflammatory conditions.. Integumentary (Hair, Skin) No suspicious lesions. No crepitus or fluctuance. No peri-wound warmth or erythema. No masses.Marland Kitchen Psychiatric Judgement and insight Intact.. No evidence of depression, anxiety, or agitation.Marland Kitchen  Electronic Signature(s) Signed: 10/21/2014 9:01:22 AM By: Christin Fudge MD, FACS Previous Signature: 10/21/2014 8:53:20 AM Version By: Christin Fudge MD, FACS Entered By:  Christin Fudge on 10/21/2014 09:01:21 [name] [name] ([id number]) -------------------------------------------------------------------------------- Physician Orders Details Patient Name: [name] [name], [name] [name] 10/21/2014 8:15 Date of Service: AM Medical Record [id number] Number: Patient Account Number: [id number] 1950-04-19 (64 y.o. Treating RN: Montey Hora Date of Birth/Sex: Female) Other Clinician: Primary Care Physician: PATIENT, NO Treating Latangela Mccomas Referring Physician: Physician/Extender: Suella Grove in Treatment: 13 Verbal / Phone Orders: Yes Clinician: Montey Hora Read Back and Verified: Yes Diagnosis Coding Wound Cleansing Wound #1 Left Chest o Clean wound with Normal Saline. Anesthetic Wound #1 Left Chest o Topical Lidocaine 4% cream applied to wound bed prior to debridement Skin Barriers/Peri-Wound Care Wound #1 Left Chest o Skin Prep Primary Wound Dressing Wound #1 Left Chest o Aquacel Ag Secondary Dressing Wound #1 Left Chest o Boardered Foam Dressing Dressing Change Frequency Wound #1 Left Chest o Change dressing every other day. - or more if needed Follow-up Appointments Wound #1 Left Chest o Return Appointment in 1 month Electronic Signature(s) Signed: 10/21/2014 12:34:21 PM By: Christin Fudge MD, FACS Signed: 10/21/2014 5:31:46 PM By: Roslyn Smiling, Felicitas W. ([id number]) Entered By: Montey Hora on 10/21/2014 08:43:21 [name] [name] ([id number]) -------------------------------------------------------------------------------- Problem List Details Patient Name: ALEXSYS, ESKIN 10/21/2014 8:15 Date of Service: AM Medical Record [id number] Number: Patient Account Number: [id number] July 27, 1950 (64 y.o. Treating RN: Date of Birth/Sex: Female) Other Clinician: Primary Care Physician: PATIENT, NO Treating Naelle Diegel Referring Physician: Physician/Extender: Suella Grove in Treatment: 14 Active Problems ICD-10 Encounter Code  Description Active Date Diagnosis C50.812 Malignant neoplasm of overlapping sites of left female 07/11/2014 Yes breast S21.001A Unspecified open wound of right breast, initial encounter 07/11/2014 Yes C50.412 Malignant neoplasm of upper-outer quadrant of left female 07/11/2014 Yes breast S21.002A Unspecified open wound of left breast, initial encounter 07/11/2014 Yes Inactive Problems Resolved Problems Electronic Signature(s) Signed: 10/21/2014 8:51:48 AM By: Christin Fudge MD, FACS Entered By: Christin Fudge on 10/21/2014 08:51:48 [name] [name] ([id number]) -------------------------------------------------------------------------------- Progress Note Details Patient Name: [name] [name]. 10/21/2014 8:15 Date of Service: AM Medical Record [id number] Number: Patient Account Number: [id number] [birth year] (64 y.o. 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 07/18/2014 -- since last week the patient has had no complaints and is on her last cycle of chemotherapy. I have reviewed 155 pages of her reports from a medical oncologist and the summary is made in the history of present illness. History of Present Illness (HPI) this 64 year old patient has come by herself today as a self-referral and has no documentation with her. She has had open wounds to her left chest and the right chest wall for about 2 months. She is not a diabetic and has no significant medical history except breast cancer. Her right breast cancer was diagnosed 18 years ago and she had a mastectomy and  chemotherapy. 3 years ago she was then diagnosed with a breast cancer of the left side which was advanced and could not be operated and was given chemotherapy for a year and then followed by radiation. Her last radiation  was over a year ago. She has not had any biopsy done recently from the ulcerated area but she says there have been other biopsies done from the chest wall and these reports are not available at the present time. She was told to keep the wounds open and let them dry out but she is finding it's more and more difficult to stop soiling her clothes if she leaves wounds open. She is on chemotherapy and we will try and obtain these notes from her oncologist. She is not in pain and does not have any significant problems except the discomfort from an open wound on the chest wall. 07/18/2014 This is a summary of [name] [name] date of birth July 05, 1950. These are obtained by reviewing 151 pages of medical records which were obtained from the [facility] 64 year old patient who had a history of stage I right breast cancer status post modified radical mastectomy on 06/12/1996. 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 She received 4 cycles of AC at [facility]. In January 2014 she presented with left breast swelling and apparently cellulitis. Breast biopsy done revealed lobular carcinoma which was ER and PR positive and HER-2/neu negative. On PET scan and she was shown to have multiple areas of disease left breast, axilla and supraclavicular areas. She received 6 cycles of chemotherapy and then received Femara because she was not a surgical candidate. PET scan also revealed disease in the left lateral breast and new cervical lymph nodes at level II. The patient was then sent for radiation therapy and received this over a month. In January 2016 she had a PET CT scan done which was compatible with marked progression of disease with  multifocal soft tissue masses in the chest wall left greater than right. [name] [name], [name] [name]. ([id number]) She is currently receiving Xeloda and is noted to have disease in the chest wall which is widely present. Her most recent lab work from 07/02/2014 shows that her CMP is within normal limits with a serum albumin of 4.4. The previous CBC was within normal limits with a WBC count of 4.3 hemoglobin of 14 hematocrit of 40.9 and platelets of 202. 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 We will continue with palliative care and make her comfortable as much as possible as far as her wound care goes. Present time there is no odor but if this does occur we would use carbon- based products to mask the odor. Notes were also reviewed from her surgeon Dr. Hervey Ard --closure on 05/27/2014 for breast biopsy in view of the fact that she had extensive disease on her chest wall. Biopsies are taken from the nodular metastatic disease overlying the sternum and nodules chosen for biopsy. Punch biopsies were taken and the pathology on this confirmed that this was consistent with carcinoma 08/15/2014 -- she is started on a new course of injectable chemotherapy and this is 2 weeks on and 2 weeks off. She has no new symptoms no bleeding from the wounds nor is there any odor. She is doing fine otherwise. 09/16/2014. She is doing very well and the right side wounds have completely healed. She has finished 2 cycles of chemotherapy and she is 2 weeks on and 2 weeks off. He is to restart her  chemotherapy this week. No fresh complaints pain is within control and there is no odor from the wounds. 10/21/2014 -- overall she is doing very well and is on a clinical trial with her medical oncologist. No issues with the wound  on her left side and there is no odor or drainage. Objective Constitutional Pulse regular. Respirations normal and unlabored. Afebrile. Vitals Time Taken: 8:30 AM, Height: 68 in, Weight: 180.5 lbs, BMI: 27.4, Temperature: 98.4 F, Pulse: 98 bpm, Respiratory Rate: 18 breaths/min, Blood Pressure: 112/73 mmHg. Eyes Nonicteric. Reactive to light. Ears, Nose, Mouth, and Throat Lips, teeth, and gums WNL.Marland Kitchen Moist mucosa without lesions . Neck supple and nontender. No palpable supraclavicular or cervical adenopathy. Normal sized without goiter. Respiratory WNL. No retractions.[name] [name], [name] [name] ([id number]) Cardiovascular Pedal Pulses WNL. No clubbing, cyanosis or edema. Chest she has significant thickening of the breast on the left side and open ulceration on the left axillary area which has minimal slough but the rest of it is nice and clean. Right chest wall is completely healed.. Breast tissue WNL, no masses, lumps, or tenderness.. Musculoskeletal Adexa without tenderness or enlargement.. Digits and nails w/o clubbing, cyanosis, infection, petechiae, ischemia, or inflammatory conditions.Marland Kitchen Psychiatric Judgement and insight Intact.. No evidence of depression, anxiety, or agitation.. Integumentary (Hair, Skin) No suspicious lesions. No crepitus or fluctuance. No peri-wound warmth or erythema. No masses.. Wound #1 status is Open. Original cause of wound was Other Lesion. The wound is located on the Left Chest. The wound measures 4.1cm length x 6cm width x 0.1cm depth; 19.321cm^2 area and 1.932cm^3 volume. The wound is limited to skin breakdown. There is no tunneling or undermining noted. There is a small amount of serosanguineous drainage noted. The wound margin is distinct with the outline attached to the wound base. There is medium (34-66%) red granulation within the wound bed. There is a medium (34- 66%) amount of necrotic tissue within the wound bed including Adherent Slough. The  periwound skin appearance had no abnormalities noted for moisture. The periwound skin appearance had no abnormalities noted for color. The periwound skin appearance exhibited: Induration, Scarring. The periwound skin appearance did not exhibit: Callus, Crepitus, Excoriation, Fluctuance, Friable, Localized Edema, Rash. Periwound temperature was noted as No Abnormality. Assessment Active Problems ICD-10 C50.812 - Malignant neoplasm of overlapping sites of left female breast S21.001A - Unspecified open wound of right breast, initial encounter C50.412 - Malignant neoplasm of upper-outer quadrant of left female breast S21.002A - Unspecified open wound of left breast, initial encounter Situ, [name] ([id number]) We will continue with the silver alginate and if necessary, Carboflex if the odor comes back. Bordered foam dressing is what she normally uses and we will continue to support her palliative care as long as she requires it. Plan Wound Cleansing: Wound #1 Left Chest: Clean wound with Normal Saline. Anesthetic: Wound #1 Left Chest: Topical Lidocaine 4% cream applied to wound bed prior to debridement Skin Barriers/Peri-Wound Care: Wound #1 Left Chest: Skin Prep Primary Wound Dressing: Wound #1 Left Chest: Aquacel Ag Secondary Dressing: Wound #1 Left Chest: Boardered Foam Dressing Dressing Change Frequency: Wound #1 Left Chest: Change dressing every other day. - or more if needed Follow-up Appointments: Wound #1 Left Chest: Return Appointment in 1 month We will continue with the silver alginate and if necessary, Carboflex if the odor comes back. Bordered foam dressing is what she normally uses and we will continue to support her palliative care as long as she requires it. Electronic Signature(s) Signed: 10/21/2014 9:03:00 AM  By: Christin Fudge MD, FACS Entered By: Christin Fudge on 10/21/2014 09:03:00 [name] [name]  ([id number]) -------------------------------------------------------------------------------- SuperBill Details Patient Name: [name] [name] Date of Service: 10/21/2014 Medical Record Number: [id number] Patient Account Number: [id number] Date of Birth/Sex: Feb 15, 1951 (63 y.o. Female) Treating RN: Primary Care Physician: PATIENT, NO Other Clinician: Referring Physician: Treating Physician/Extender: Frann Rider in Treatment: 14 Diagnosis Coding ICD-10 Codes Code Description C50.812 Malignant neoplasm of overlapping sites of left female breast S21.001A Unspecified open wound of right breast, initial encounter C50.412 Malignant neoplasm of upper-outer quadrant of left female breast S21.002A Unspecified open wound of left breast, initial encounter Facility Procedures CPT4 Code: [id number] Description: [phone] - WOUND CARE VISIT-LEV 2 EST PT Modifier: Quantity: 1 Physician Procedures CPT4 Code: 2449753 Description: 00511 - WC PHYS LEVEL 3 - EST PT ICD-10 Description Diagnosis C50.812 Malignant neoplasm of overlapping sites of left S21.002A Unspecified open wound of left breast, initial e Modifier: female breast ncounter Quantity: 1 Electronic Signature(s) Signed: 10/21/2014 9:03:26 AM By: Christin Fudge MD, FACS Entered By: Christin Fudge on 10/21/2014 09:03:26

## 2014-10-24 ENCOUNTER — Ambulatory Visit
Admission: RE | Admit: 2014-10-24 | Discharge: 2014-10-24 | Disposition: A | Discharge status: Home / Self Care | Payer: Medicare Other | Source: Ambulatory Visit | Attending: Hematology and Oncology | Admitting: Hematology and Oncology

## 2014-10-24 DIAGNOSIS — Z9011 Acquired absence of right breast and nipple: Secondary | ICD-10-CM | POA: Diagnosis not present

## 2014-10-24 DIAGNOSIS — C50912 Malignant neoplasm of unspecified site of left female breast: Secondary | ICD-10-CM | POA: Insufficient documentation

## 2014-10-24 MED ORDER — IOHEXOL 300 MG/ML  SOLN
100.0000 mL | Freq: Once | INTRAMUSCULAR | Status: AC | PRN
  Administered 2014-10-24: 100 mL via INTRAVENOUS

## 2014-10-29 ENCOUNTER — Encounter: Payer: Self-pay | Admitting: *Deleted

## 2014-10-29 ENCOUNTER — Inpatient Hospital Stay (HOSPITAL_BASED_OUTPATIENT_CLINIC_OR_DEPARTMENT_OTHER): Payer: Medicare Other | Admitting: Hematology and Oncology

## 2014-10-29 ENCOUNTER — Ambulatory Visit
Admission: RE | Admit: 2014-10-29 | Discharge: 2014-10-29 | Disposition: A | Discharge status: Home / Self Care | Payer: Medicare Other | Source: Ambulatory Visit | Attending: Radiation Oncology | Admitting: Radiation Oncology

## 2014-10-29 ENCOUNTER — Inpatient Hospital Stay: Payer: Medicare Other

## 2014-10-29 ENCOUNTER — Encounter: Payer: Self-pay | Admitting: Radiation Oncology

## 2014-10-29 VITALS — BP 138/85 | HR 79 | Temp 97.8°F | Resp 20

## 2014-10-29 VITALS — Ht 67.8 in | Wt 172.4 lb

## 2014-10-29 DIAGNOSIS — C50912 Malignant neoplasm of unspecified site of left female breast: Secondary | ICD-10-CM | POA: Diagnosis not present

## 2014-10-29 DIAGNOSIS — Z006 Encounter for examination for normal comparison and control in clinical research program: Secondary | ICD-10-CM

## 2014-10-29 DIAGNOSIS — Z5111 Encounter for antineoplastic chemotherapy: Secondary | ICD-10-CM | POA: Diagnosis not present

## 2014-10-29 DIAGNOSIS — Z171 Estrogen receptor negative status [ER-]: Secondary | ICD-10-CM

## 2014-10-29 DIAGNOSIS — C792 Secondary malignant neoplasm of skin: Principal | ICD-10-CM

## 2014-10-29 DIAGNOSIS — R634 Abnormal weight loss: Secondary | ICD-10-CM

## 2014-10-29 LAB — COMPREHENSIVE METABOLIC PANEL
ALT: 23 U/L (ref 14–54)
AST: 22 U/L (ref 15–41)
Albumin: 4.1 g/dL (ref 3.5–5.0)
Alkaline Phosphatase: 84 U/L (ref 38–126)
Anion gap: 8 (ref 5–15)
BUN: 16 mg/dL (ref 6–20)
CO2: 25 mmol/L (ref 22–32)
Calcium: 8.7 mg/dL — ABNORMAL LOW (ref 8.9–10.3)
Chloride: 103 mmol/L (ref 101–111)
Creatinine, Ser: 0.84 mg/dL (ref 0.44–1.00)
GFR calc Af Amer: >60 mL/min (ref >60)
GFR calc non Af Amer: >60 mL/min (ref >60)
Glucose, Bld: 88 mg/dL (ref 65–99)
Potassium: 4.1 mmol/L (ref 3.5–5.1)
Sodium: 136 mmol/L (ref 135–145)
Total Bilirubin: 0.4 mg/dL (ref 0.3–1.2)
Total Protein: 7.7 g/dL (ref 6.5–8.1)

## 2014-10-29 LAB — CBC WITH DIFFERENTIAL/PLATELET
Basophils Absolute: 0 10*3/uL (ref 0–0.1)
Basophils Relative: 1 %
Eosinophils Absolute: 0 10*3/uL (ref 0–0.7)
Eosinophils Relative: 0 %
HCT: 40.7 % (ref 35.0–47.0)
Hemoglobin: 13.2 g/dL (ref 12.0–16.0)
Lymphocytes Relative: 37 %
Lymphs Abs: 1.4 10*3/uL (ref 1.0–3.6)
MCH: 29.7 pg (ref 26.0–34.0)
MCHC: 32.5 g/dL (ref 32.0–36.0)
MCV: 91.4 fL (ref 80.0–100.0)
Monocytes Absolute: 0.8 10*3/uL (ref 0.2–0.9)
Monocytes Relative: 20 %
Neutro Abs: 1.6 10*3/uL (ref 1.4–6.5)
Neutrophils Relative %: 42 %
Platelets: 241 10*3/uL (ref 150–440)
RBC: 4.45 MIL/uL (ref 3.80–5.20)
RDW: 18.1 % — ABNORMAL HIGH (ref 11.5–14.5)
WBC: 3.8 10*3/uL (ref 3.6–11.0)

## 2014-10-29 MED ORDER — HEPARIN SOD (PORK) LOCK FLUSH 100 UNIT/ML IV SOLN
500.0000 [IU] | Freq: Once | INTRAVENOUS | Status: AC | PRN
  Administered 2014-10-29: 500 [IU]
  Filled 2014-10-29: qty 5

## 2014-10-29 MED ORDER — SODIUM CHLORIDE 0.9 % IV SOLN
Freq: Once | INTRAVENOUS | Status: AC
  Administered 2014-10-29: 16:00:00 via INTRAVENOUS
  Filled 2014-10-29: qty 4

## 2014-10-29 MED ORDER — INV-ERIBULIN CHEMO INJECTION 1 MG/2ML RU011201I ACCRU
1.4000 mg/m2 | Freq: Once | INTRAVENOUS | Status: AC
  Administered 2014-10-29: 2.7 mg via INTRAVENOUS
  Filled 2014-10-29: qty 5.4

## 2014-10-29 MED ORDER — SODIUM CHLORIDE 0.9 % IV SOLN
Freq: Once | INTRAVENOUS | Status: AC
  Administered 2014-10-29: 16:00:00 via INTRAVENOUS
  Filled 2014-10-29: qty 1000

## 2014-10-29 NOTE — Progress Notes (Signed)
Patient is here today for follow up Breast cancer and treatment with [place]. States her appetite is decreased by 50% over the last 2-3 months.

## 2014-10-29 NOTE — Progress Notes (Signed)
Radiation Oncology Follow up Note  Name: [name] [name]   Date:   10/29/2014 MRN:  [id number] DOB: 1950-04-22    This 64 y.o. 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  REFERRING PROVIDER: No ref. provider found  HPI: patient is a 64 year old female now out over a year having completed radiation therapy to her left breast for stage IIIB invasive mammary carcinoma. Her initial history goes back to 80 when she was status post right modified radical mastectomy. Last use presented with inflammation of her left breast over 6 months. Biopsy positive for lobular carcinoma. At that time multiple chest wall biopsy positive covering too large an area for surgical resection. She underwent new adjuvant chemotherapy with good result confirmed on PET CT scan. PET was only positive in the left chest wall and she started developing satellite skin nodules consistent with metastatic disease to her skin. We treated her breast for palliation..she has done well is currently on maintenance chemotherapy with [place]. She has an open wound in the left side of her chest which is bandaged at this time still takes tramadol for pain.she seen today in routine follow-up and feels she is doing fairly well slightly fatigued.repeat CT scan this month shows multifocal inflammatory left breast cancer chest wall involvement no other evidence of distant disease.  COMPLICATIONS OF TREATMENT: none  FOLLOW UP COMPLIANCE: keeps appointments   PHYSICAL EXAM:  BP 138/85 mmHg  Pulse 79  Temp(Src) 97.8 F (36.6 C) (Tympanic)  Resp 20 Patient status post right modified radical mastectomy. Left breast is totally matted with no evidence of old open ulceration or scars except for the bandaged area in the lateral aspect of her chest wall. Masses matted and extends into the axilla. Well-developed well-nourished patient in NAD. HEENT reveals PERLA, EOMI, discs not visualized.  Oral cavity is  clear. No oral mucosal lesions are identified. Neck is clear without evidence of cervical or supraclavicular adenopathy. Lungs are clear to A&P. Cardiac examination is essentially unremarkable with regular rate and rhythm without murmur rub or thrill. Abdomen is benign with no organomegaly or masses noted. Motor sensory and DTR levels are equal and symmetric in the upper and lower extremities. Cranial nerves II through XII are grossly intact. Proprioception is intact. No peripheral adenopathy or edema is identified. No motor or sensory levels are noted. Crude visual fields are within normal range.   RADIOLOGY RESULTS: CT scan of chest abdomen and pelvis are reviewed and compatible with the above-stated findings  PLAN: at the present time she has stable disease with only chest wall involvement under good palliative control. She continues close follow-up care with medical oncology and continued maintenance chemotherapy. I have asked to see her back in 6 months for follow-up. We'll be happy to reevaluate her any time should further palliative treatment be indicated.  I would like to take this opportunity for allowing me to participate in the care of your patient.Armstead Peaks., MD

## 2014-10-29 NOTE — Progress Notes (Signed)
[facility] day:  10/29/2014  Chief Complaint: [name] [name] is a 64 y.o. female with recurrent breast cancer who is seen for review of interval restaging studies and assessment prior to day 1 of cycle #5 Halaven.  HPI: The patient was last seen in the medical oncology clinic on 10/08/2014.  At that time, she began cycle #4.  Symptomatically, she remained fatigued. She continued to slowly lose weight.  Exam revealed resolution of right chest wall lesions, stable flat superior sternal lesion, and a stable large triangle shaped denuded area in the left lateral chest wall.   During the interim, she has been quite fatigued, although she believes "the heat plays into it".  Her stamina is poor.  She notes being up 2 nights during the week.  She feels a little less steady on her feet.  She notes that the neuropathy in her feet is the same.  She states that her feet feel cold, but are warm.  Today, her feet feel "fine".  She denies any recurrent ulnar neuropathy.  She states that she "eats something every day".  Her chest wall lesions are improving.  Chest, abdomen, and pelvic CT scan on 10/24/2014 revealed improved multifocal inflammatory left breast cancer.  The dominant 2.6 x 3.5 cm lesion in the left upper outer left breast has decreased to 1.7 x 3.5 cm. The anterior sternal lesion has decreased from 2 x 4 cm to 1.5 x 3.9 cm. There were no suspicious mediastinal, hilar or axillary adenopathy.  Past Medical History  Diagnosis Date  . Personal history of malignant neoplasm of breast   . Anemia   . Blood transfusion without reported diagnosis   . Emphysema of lung   . Breast cancer     Past Surgical History  Procedure Laterality Date  . Oophrectomy  1980  . Abdominal hysterectomy  1995  . Breast surgery Right 1996    mastectomy  . Breast biopsy Left 2014  . Breast biopsy Left 01-21-14    Family History  Problem Relation Age of Onset  .  Cancer Father   . Heart disease Sister   . Diabetes Sister   . Heart disease Brother   . Cancer Paternal Grandmother     breast    Social History:  reports that she has been smoking Cigarettes.  She has a 25 pack-year smoking history. She has never used smokeless tobacco. She reports that she does not drink alcohol or use illicit drugs.  She would like to go on a cruise in 12/2014.  The patient is accompanied by Karma Ganja, protocol nurse.  Allergies:  Allergies  Allergen Reactions  . Sulfa Antibiotics Nausea And Vomiting    headache    Current Medications: Current Outpatient Prescriptions  Medication Sig Dispense Refill  . Calcium Carbonate-Vitamin D (CALCIUM + D PO) Take 2 tablets by mouth daily.    Marland Kitchen KLOR-CON 10 10 MEQ tablet Take 1 tablet (10 mEq total) by mouth daily. 30 tablet 1  . LORazepam (ATIVAN) 0.5 MG tablet   0  . traMADol (ULTRAM) 50 MG tablet Take 1 tablet (50 mg total) by mouth every 8 (eight) hours as needed. 90 tablet 0   No current facility-administered medications for this visit.   Facility-Administered Medications Ordered in Other Visits  Medication Dose Route Frequency Provider Last Rate Last Dose  . sodium chloride 0.9 % injection 10 mL  10 mL Intracatheter PRN Lequita Asal, MD      .  sodium chloride 0.9 % injection 10 mL  10 mL Intracatheter PRN Lequita Asal, MD   10 mL at 08/27/14 1400  . sodium chloride 0.9 % injection 10 mL  10 mL Intravenous PRN Lequita Asal, MD   10 mL at 10/08/14 1335  . sodium chloride 0.9 % injection 10 mL  10 mL Intracatheter PRN Lequita Asal, MD        Review of Systems:  GENERAL:  Fatigue.  Lack of stamina.  No fevers or sweats.  Weight loss of 3 pounds. PERFORMANCE STATUS (ECOG):  1 HEENT:  No visual changes, runny nose, sore throat, mouth sores or tenderness. Lungs: No shortness of breath or cough.  No hemoptysis. Cardiac:  No chest pain, palpitations, orthopnea, or PND. GI:  Appetite poor.   Constipation.  No nausea, vomiting, diarrhea, melena or hematochezia. GU:  No urgency, frequency, dysuria, or hematuria. Musculoskeletal:  No back pain.  No joint pain.  No muscle tenderness. Extremities:  No pain or swelling. Skin:  Hair thinning.  No rashes or skin changes. Neuro:  Baseline neuropathy pre-treatment.  Cold sensation in feet.  No headache, numbness or weakness, balance or coordination issues. Endocrine:  No diabetes, thyroid issues, hot flashes or night sweats. Psych:  Insomnia.  No mood changes, depression or anxiety. Pain:  Stable pain treated with Tramadol. Review of systems:  All other systems reviewed and found to be negative.   Physical Exam: Height 5' 7.8" (1.722 m), weight 172 lb 6.4 oz (78.2 kg).  GENERAL: Well developed, well nourished, sitting comfortably in the exam room in no acute distress. MENTAL STATUS: Alert and oriented to person, place and time. HEAD: Short brown thinning hair with slight graying. Normocephalic, atraumatic, face symmetric, no Cushingoid features. EYES: Brown eyes. Pupils equal round and reactive to light and accomodation. No conjunctivitis or scleral icterus. ENT: Oropharynx clear without lesion. Tongue normal. Mucous membranes moist.  RESPIRATORY: Clear to auscultation without rales, wheezes or rhonchi. CARDIOVASCULAR: Regular rate and rhythm without murmur, rub or gallop. BREAST: Right chest wall lesions healed. Central upper sternal area remains flat.  Left lateral denuded area triangular in shape  measuring 6.2 x 4 cm in greatest dimension, but tapering (arrow head shape). ABDOMEN: Soft, non-tender, with active bowel sounds, and no hepatosplenomegaly. No masses. SKIN: Cutaneous breast cancer noted in breast section. No other lesions.  Long nails. EXTREMITIES: No edema, no skin discoloration or tenderness. No palpable cords. LYMPH NODES: Left axillary ridge less prominent. No palpable cervical, supraclavicular,  axillary or inguinal adenopathy  NEUROLOGICAL: Unremarkable. PSYCH: Appropriate.  Appointment on 10/29/2014  Component Date Value Ref Range Status  . WBC 10/29/2014 3.8  3.6 - 11.0 K/uL Final   A-LINE DRAW  . RBC 10/29/2014 4.45  3.80 - 5.20 MIL/uL Final  . Hemoglobin 10/29/2014 13.2  12.0 - 16.0 g/dL Final  . HCT 10/29/2014 40.7  35.0 - 47.0 % Final  . MCV 10/29/2014 91.4  80.0 - 100.0 fL Final  . MCH 10/29/2014 29.7  26.0 - 34.0 pg Final  . MCHC 10/29/2014 32.5  32.0 - 36.0 g/dL Final  . RDW 10/29/2014 18.1* 11.5 - 14.5 % Final  . Platelets 10/29/2014 241  150 - 440 K/uL Final  . Neutrophils Relative % 10/29/2014 42   Final  . Neutro Abs 10/29/2014 1.6  1.4 - 6.5 K/uL Final  . Lymphocytes Relative 10/29/2014 37   Final  . Lymphs Abs 10/29/2014 1.4  1.0 - 3.6 K/uL Final  . Monocytes  Relative 10/29/2014 20   Final  . Monocytes Absolute 10/29/2014 0.8  0.2 - 0.9 K/uL Final  . Eosinophils Relative 10/29/2014 0   Final  . Eosinophils Absolute 10/29/2014 0.0  0 - 0.7 K/uL Final  . Basophils Relative 10/29/2014 1   Final  . Basophils Absolute 10/29/2014 0.0  0 - 0.1 K/uL Final  . Sodium 10/29/2014 136  135 - 145 mmol/L Final  . Potassium 10/29/2014 4.1  3.5 - 5.1 mmol/L Final  . Chloride 10/29/2014 103  101 - 111 mmol/L Final  . CO2 10/29/2014 25  22 - 32 mmol/L Final  . Glucose, Bld 10/29/2014 88  65 - 99 mg/dL Final  . BUN 10/29/2014 16  6 - 20 mg/dL Final  . Creatinine, Ser 10/29/2014 0.84  0.44 - 1.00 mg/dL Final  . Calcium 10/29/2014 8.7* 8.9 - 10.3 mg/dL Final  . Total Protein 10/29/2014 7.7  6.5 - 8.1 g/dL Final  . Albumin 10/29/2014 4.1  3.5 - 5.0 g/dL Final  . AST 10/29/2014 22  15 - 41 U/L Final  . ALT 10/29/2014 23  14 - 54 U/L Final  . Alkaline Phosphatase 10/29/2014 84  38 - 126 U/L Final  . Total Bilirubin 10/29/2014 0.4  0.3 - 1.2 mg/dL Final  . GFR calc non Af Amer 10/29/2014 >60  >60 mL/min Final  . GFR calc Af Amer 10/29/2014 >60  >60 mL/min Final   Comment:  (NOTE) The eGFR has been calculated using the CKD EPI equation. This calculation has not been validated in all clinical situations. eGFR's persistently <60 mL/min signify possible Chronic Kidney Disease.   . Anion gap 10/29/2014 8  5 - 15 Final    Assessment:  [name] [name] is a 64 y.o. African American woman with a history of stage I right breast cancer s/p modified radical mastectomy on 06/12/1996. Pathology revealed a grade III mutifocal infiltrating ductal carcinoma (tumor size 8.5 cm with an invasive component 1.6 cm). Twenty-one lymph nodes were negative. Tumor was ER/PR negative. She received 4 cycles of AC at [facility].  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 PET scan on 05/22/2012 revealed multiple areas of disease. She received 6 cycles of Taxotere and Cytoxan (TC) from 06/06/2012 - 09/29/2012. PET scan on 09/07/2012 noted marked improvement. Post chemotherapy biopsy was positive. She was not a surgical candidate. She was placed on Femara.  PET scan on 05/03/2013 revealed a new focus along lateral left breast and a new level II cervical lymph node. She received radiation from 09/13/2013 - 11/05/2013. PET scan on 01/01/2014 revealed new nodule medial left chest wall pleural/paraspinal activity LUL. Multiple biopsies on 01/19/2014 were positive. She began daily letrozole and Ibrance on 02/12/2014. Despite treatment, new nodules formed. She received a total of 2 cycles.   PET scan on 05/09/2014 revealed marked progression of disease with multifocal masses in chest wall (left > right). She did not receive Imbrance in 04/2014. CA27.29 has increased: 22.9 on 01/08/2014, 54 on 03/05/2014, 170.8 on 05/06/2014, 250.8 on 05/23/2014, 445.8 on 06/14/2014, and 531.1 on 07/01/2104.   She received 3 cycles of Xeloda (02/12 - 07/05/2014). Chest and abdomen CT scan on  07/05/2014 revealed persistent and slightly progressive diffuse locally invasive chest wall tumor. Bone scan on 07/25/2014 revealed no evidence of metastatic disease.   She is status post 4 cycles of Halaven (08/06/2014 - 10/08/2014) on ACCRU [id number] I. Cycle #1 was notable for  an [place] of 200 on day 15. Cycle #2 was notable for grade II fatigue, brief grade I diarrhea, and decreased appetite.  Cycle #3 was notable for grade I fatigue, anorexia, and weight loss.  Cycle #4 was notable for fatigue/lack of stamina, ongoing weight loss, and transient cold neuropathy in her feet.  Chest, abdomen, and pelvic CT scan on 10/24/2014 revealed improved multifocal inflammatory left breast cancer.  The dominant 2.6 x 3.5 cm lesion in the left upper outer left breast has decreased to 1.7 x 3.5 cm. The anterior sternal lesion has decreased from 2 x 4 cm to 1.5 x 3.9 cm. There were no suspicious mediastinal, hilar or axillary adenopathy.  Symptomatically, she remains fatigued. She continues to lose weight.  Exam reveals improving chest wall disease.  Plan: 1. Labs today: CBC with diff, CMP. 2. Day 1 of cycle #5 Halaven. 3. Medical photographs. 4. RTC in 1 week for labs and day 8 of cycle #5 Halaven. 5. Discuss Megace for appetite stimulation. 6. RTC in 3 weeks for MD assessment, labs (CBC with diff, CMP, CA27.29), and cycle #6 Halaven.   Lequita Asal, MD

## 2014-10-30 NOTE — Progress Notes (Signed)
10/29/2014 @1545  [name] [name] returns to clinic this afternoon for consideration of Cycle 5, Day 1 Eribulin infusion for the [id number] I research study. Patient states she is doing well other than feeling weak fatigued all of the time and not having any appetite. She describes her fatigue as not having any energy and feeling like her legs are weak. States she does try to get out of the house daily, and had been walking at the park some until the weather became very hot. Patient reports she has a very poor appetite and [name] discussed with her the possibility of prescribing some medication to stimulate her appetite. [name] is reluctant to take additional medication and states she will make a better effort to eat regularly and try to add a Boost or Ensure supplement daily. Her current weight is 78.2 kg - down from 83 kg at baseline; which is a loss of almost 5 kg(or 11 lbs). Patient reports she did not have any loose stools until she had to drink the contrast for her CT scan, and states that always upsets her stomach a little. Reports her pain is the same - just occasional transient episodes of sharp, shooting pain through her left chest, and she takes Tramadol in effort to control/prevent this. [name] reports one new adverse event since her last visit, which is the feeling of coldness in her feet at times - even though they are warm to touch. She reports feeling the sensation like "small drops of water" splashing on her feet and lower legs, and she cannot distinguish whether this would be considered a type of tingling. She denies any problems with ambulation. Her transient episodes of numbness and tingling in her bilateral fingers, hands & forearms since baseline have not increased and patient states she is noticing this a little less than before. [name] VS are stable. CBC and Chemistries are within acceptable parameters for patient to receive her treatment today. [name] reviewed her CT  results with her and significant improvement was noted. Her CA 27.29 value was 51.7 when checked 2 weeks ago, and this is significantly decreased from 339.8 at baseline. [name] also assessed, measured and photographed Ms. Vanpatten's remaining skin lesion on her left lateral chest. The Lesions on the left and right anterior chest wall have completely healed. Will proceed with C5D1 Eribulin infusion as planned. Current AEs with grade and attributions noted below.     Decreased appetite - grade 1; definitely attributed Fatigue - grade 1; deifnitely attributed Alopecia - grade 1; definitely attributed Insomnia - grade 1; unlikely related Peripheral Neuropathy - grade 1; probably related Pain - grade 1; not attributed Weight loss: 5.8% since baseline - grade 1; definitely related

## 2014-11-05 ENCOUNTER — Inpatient Hospital Stay: Payer: Medicare Other

## 2014-11-05 ENCOUNTER — Encounter: Payer: Self-pay | Admitting: Hematology and Oncology

## 2014-11-05 ENCOUNTER — Encounter: Payer: Self-pay | Admitting: *Deleted

## 2014-11-05 VITALS — BP 124/75 | HR 92 | Temp 96.0°F | Resp 20

## 2014-11-05 DIAGNOSIS — C50912 Malignant neoplasm of unspecified site of left female breast: Secondary | ICD-10-CM

## 2014-11-05 DIAGNOSIS — Z5111 Encounter for antineoplastic chemotherapy: Secondary | ICD-10-CM | POA: Diagnosis not present

## 2014-11-05 LAB — CBC WITH DIFFERENTIAL/PLATELET
Basophils Absolute: 0 10*3/uL (ref 0–0.1)
Basophils Relative: 0 %
Eosinophils Absolute: 0 10*3/uL (ref 0–0.7)
Eosinophils Relative: 0 %
HCT: 38.5 % (ref 35.0–47.0)
Hemoglobin: 12.6 g/dL (ref 12.0–16.0)
Lymphocytes Relative: 33 %
Lymphs Abs: 1.2 10*3/uL (ref 1.0–3.6)
MCH: 29.4 pg (ref 26.0–34.0)
MCHC: 32.6 g/dL (ref 32.0–36.0)
MCV: 90.3 fL (ref 80.0–100.0)
Monocytes Absolute: 0.4 10*3/uL (ref 0.2–0.9)
Monocytes Relative: 10 %
Neutro Abs: 2.2 10*3/uL (ref 1.4–6.5)
Neutrophils Relative %: 57 %
Platelets: 201 10*3/uL (ref 150–440)
RBC: 4.26 MIL/uL (ref 3.80–5.20)
RDW: 17.8 % — ABNORMAL HIGH (ref 11.5–14.5)
WBC: 3.8 10*3/uL (ref 3.6–11.0)

## 2014-11-05 LAB — COMPREHENSIVE METABOLIC PANEL
ALT: 38 U/L (ref 14–54)
AST: 38 U/L (ref 15–41)
Albumin: 4 g/dL (ref 3.5–5.0)
Alkaline Phosphatase: 84 U/L (ref 38–126)
Anion gap: 7 (ref 5–15)
BUN: 13 mg/dL (ref 6–20)
CO2: 27 mmol/L (ref 22–32)
Calcium: 8.9 mg/dL (ref 8.9–10.3)
Chloride: 106 mmol/L (ref 101–111)
Creatinine, Ser: 0.83 mg/dL (ref 0.44–1.00)
GFR calc Af Amer: >60 mL/min (ref >60)
GFR calc non Af Amer: >60 mL/min (ref >60)
Glucose, Bld: 153 mg/dL — ABNORMAL HIGH (ref 65–99)
Potassium: 3.6 mmol/L (ref 3.5–5.1)
Sodium: 140 mmol/L (ref 135–145)
Total Bilirubin: 0.6 mg/dL (ref 0.3–1.2)
Total Protein: 7.5 g/dL (ref 6.5–8.1)

## 2014-11-05 MED ORDER — HEPARIN SOD (PORK) LOCK FLUSH 100 UNIT/ML IV SOLN
500.0000 [IU] | Freq: Once | INTRAVENOUS | Status: AC
  Administered 2014-11-05: 500 [IU] via INTRAVENOUS

## 2014-11-05 MED ORDER — INV-ERIBULIN CHEMO INJECTION 1 MG/2ML RU011201I ACCRU
1.4000 mg/m2 | Freq: Once | INTRAVENOUS | Status: AC
  Administered 2014-11-05: 2.7 mg via INTRAVENOUS
  Filled 2014-11-05: qty 5.4

## 2014-11-05 MED ORDER — SODIUM CHLORIDE 0.9 % IV SOLN
Freq: Once | INTRAVENOUS | Status: AC
  Administered 2014-11-05: 15:00:00 via INTRAVENOUS
  Filled 2014-11-05: qty 4

## 2014-11-05 MED ORDER — SODIUM CHLORIDE 0.9 % IV SOLN
Freq: Once | INTRAVENOUS | Status: AC
  Administered 2014-11-05: 15:00:00 via INTRAVENOUS
  Filled 2014-11-05: qty 1000

## 2014-11-05 MED ORDER — HEPARIN SOD (PORK) LOCK FLUSH 100 UNIT/ML IV SOLN
INTRAVENOUS | Status: AC
  Filled 2014-11-05: qty 5

## 2014-11-05 NOTE — Progress Notes (Signed)
11/05/14 @2 :25pm [name] [name] returns to clinic this afternoon for consideration of her Cycle 5, Day 8 Eribulin infusion. Patient reports she is about the same as last week, but feels her appetite has worsened. States she is drinking Ensure Plus twice daily now to keep her calories up. States she does not have a taste for food that used to be good to her and is eating a lot of plain pasta lately. She also reports some constipation - which she describes as no less frequency in bowel movements, but states her stools are hard in consistency now, and questions whether the Ensure may be causing that. Informed that it could be the Ensure, but may also be the fact that she is eating more pasta and less foods with fiber content. Reports energy level is unchanged and remains fatigued much of the time. Denies any difficulty falling asleep, but states she awakenns during the night, and it seems to take her longer to get back to sleep than before. Her VS, CBC and Chemistry values are within acceptable parameters for treatment today. Will proceed with C5D8 Eribulin infusion as planned. AE values and attributions as follows:   Decreased appetite - grade 1; definitely attributed Fatigue - grade 1; deifnitely attributed Alopecia - grade 1; definitely attributed Insomnia - grade 1; unlikely related Peripheral Neuropathy - grade 1; not attributed Pain - denies any pain in the past week Weight loss - not assessed today

## 2014-11-10 ENCOUNTER — Encounter: Payer: Self-pay | Admitting: Hematology and Oncology

## 2014-11-10 DIAGNOSIS — R634 Abnormal weight loss: Secondary | ICD-10-CM | POA: Insufficient documentation

## 2014-11-18 ENCOUNTER — Ambulatory Visit: Payer: Medicare Other | Admitting: Surgery

## 2014-11-19 ENCOUNTER — Inpatient Hospital Stay (HOSPITAL_BASED_OUTPATIENT_CLINIC_OR_DEPARTMENT_OTHER): Payer: Medicare Other | Admitting: Hematology and Oncology

## 2014-11-19 ENCOUNTER — Inpatient Hospital Stay: Payer: Medicare Other | Attending: Hematology and Oncology

## 2014-11-19 ENCOUNTER — Encounter: Payer: Self-pay | Admitting: *Deleted

## 2014-11-19 ENCOUNTER — Inpatient Hospital Stay: Payer: Medicare Other

## 2014-11-19 VITALS — BP 134/77 | HR 89 | Temp 95.3°F | Resp 18 | Ht 67.5 in | Wt 170.3 lb

## 2014-11-19 DIAGNOSIS — Z171 Estrogen receptor negative status [ER-]: Secondary | ICD-10-CM | POA: Insufficient documentation

## 2014-11-19 DIAGNOSIS — G629 Polyneuropathy, unspecified: Secondary | ICD-10-CM | POA: Diagnosis not present

## 2014-11-19 DIAGNOSIS — C50912 Malignant neoplasm of unspecified site of left female breast: Secondary | ICD-10-CM

## 2014-11-19 DIAGNOSIS — Z853 Personal history of malignant neoplasm of breast: Secondary | ICD-10-CM | POA: Insufficient documentation

## 2014-11-19 DIAGNOSIS — R5383 Other fatigue: Secondary | ICD-10-CM | POA: Insufficient documentation

## 2014-11-19 DIAGNOSIS — F17210 Nicotine dependence, cigarettes, uncomplicated: Secondary | ICD-10-CM | POA: Diagnosis not present

## 2014-11-19 DIAGNOSIS — Z5111 Encounter for antineoplastic chemotherapy: Secondary | ICD-10-CM | POA: Insufficient documentation

## 2014-11-19 DIAGNOSIS — Z79899 Other long term (current) drug therapy: Secondary | ICD-10-CM

## 2014-11-19 DIAGNOSIS — Z006 Encounter for examination for normal comparison and control in clinical research program: Secondary | ICD-10-CM

## 2014-11-19 DIAGNOSIS — R634 Abnormal weight loss: Secondary | ICD-10-CM

## 2014-11-19 LAB — CBC WITH DIFFERENTIAL/PLATELET
Basophils Absolute: 0 10*3/uL (ref 0–0.1)
Basophils Relative: 1 %
Eosinophils Absolute: 0 10*3/uL (ref 0–0.7)
Eosinophils Relative: 0 %
HCT: 39.2 % (ref 35.0–47.0)
Hemoglobin: 13 g/dL (ref 12.0–16.0)
Lymphocytes Relative: 39 %
Lymphs Abs: 1.4 10*3/uL (ref 1.0–3.6)
MCH: 29.6 pg (ref 26.0–34.0)
MCHC: 33.2 g/dL (ref 32.0–36.0)
MCV: 89.1 fL (ref 80.0–100.0)
Monocytes Absolute: 0.7 10*3/uL (ref 0.2–0.9)
Monocytes Relative: 20 %
Neutro Abs: 1.4 10*3/uL (ref 1.4–6.5)
Neutrophils Relative %: 40 %
Platelets: 208 10*3/uL (ref 150–440)
RBC: 4.4 MIL/uL (ref 3.80–5.20)
RDW: 19.1 % — ABNORMAL HIGH (ref 11.5–14.5)
WBC: 3.6 10*3/uL (ref 3.6–11.0)

## 2014-11-19 LAB — COMPREHENSIVE METABOLIC PANEL
ALT: 28 U/L (ref 14–54)
AST: 29 U/L (ref 15–41)
Albumin: 4 g/dL (ref 3.5–5.0)
Alkaline Phosphatase: 81 U/L (ref 38–126)
Anion gap: 6 (ref 5–15)
BUN: 15 mg/dL (ref 6–20)
CO2: 24 mmol/L (ref 22–32)
Calcium: 8.7 mg/dL — ABNORMAL LOW (ref 8.9–10.3)
Chloride: 106 mmol/L (ref 101–111)
Creatinine, Ser: 0.92 mg/dL (ref 0.44–1.00)
GFR calc Af Amer: >60 mL/min (ref >60)
GFR calc non Af Amer: >60 mL/min (ref >60)
Glucose, Bld: 98 mg/dL (ref 65–99)
Potassium: 3.6 mmol/L (ref 3.5–5.1)
Sodium: 136 mmol/L (ref 135–145)
Total Bilirubin: 0.4 mg/dL (ref 0.3–1.2)
Total Protein: 7.7 g/dL (ref 6.5–8.1)

## 2014-11-19 MED ORDER — HEPARIN SOD (PORK) LOCK FLUSH 100 UNIT/ML IV SOLN
500.0000 [IU] | Freq: Once | INTRAVENOUS | Status: AC | PRN
  Administered 2014-11-19: 500 [IU]

## 2014-11-19 MED ORDER — SODIUM CHLORIDE 0.9 % IJ SOLN
10.0000 mL | INTRAMUSCULAR | Status: DC | PRN
  Filled 2014-11-19: qty 10

## 2014-11-19 MED ORDER — SODIUM CHLORIDE 0.9 % IV SOLN
Freq: Once | INTRAVENOUS | Status: AC
  Administered 2014-11-19: 15:00:00 via INTRAVENOUS
  Filled 2014-11-19: qty 1000

## 2014-11-19 MED ORDER — ONDANSETRON HCL 40 MG/20ML IJ SOLN
Freq: Once | INTRAMUSCULAR | Status: AC
  Administered 2014-11-19: 15:00:00 via INTRAVENOUS
  Filled 2014-11-19: qty 4

## 2014-11-19 MED ORDER — INV-ERIBULIN CHEMO INJECTION 1 MG/2ML RU011201I ACCRU
1.1000 mg/m2 | Freq: Once | INTRAVENOUS | Status: AC
  Administered 2014-11-19: 2.15 mg via INTRAVENOUS
  Filled 2014-11-19: qty 4.3

## 2014-11-19 NOTE — Progress Notes (Signed)
11/19/14@1405  [name] [name] returns to clinic this afternoon for consideration of Cycle 6 Day 1 Eribulin infusion. She reports her appetite was a little better last week and states she cut back on her Ensure to just one can per day rather than two. States this helped with her constipation as well. Her weight continues to slowly decrease, and she has now lost almost 13lbs since baseline. Reports her level of fatigue and her issues with occasional insomnia are essentially the same - no worse. She describes the insomnia as waking up during the night and not being able to fall back to sleep for 2-3 hours, and this occurs about 3 times weekly; and again reports this is unchanged and was occuring long before enrollment to the study. [name] [name] is now reporting increased pain and odd sensation in her toes and tenderness on the bottoms of her feet. States she also has a cold sensation in her feet at times, even though they are warm to touch. States this is not constant, but it is affecting her balance at times. She continues to report some transient  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 When the question was posed to the patient regarding tolerability, she responded that she feels it is intolerable at times; therefore [name] will reduce her Eribulin to 1.1mg /m2 beginning today - which is a level -1 dose reduction. Patient has completed her questionnaire and her PRO-CTCAE form was reviewed with her. Left cutaneous lesion was measured and photographed per [name]. VS remain stable. CBC and Chemistries are within acceptable parameters for treatment. Will proceed with Eribulin infusion at reduced dose per protocol instructions. Current AEs with grade and attribution as follows:    Decreased appetite - grade 1; definitely attributed Fatigue - grade 1; deifnitely  attributed Alopecia - grade 1; definitely attributed Insomnia - grade 1; unlikely related Peripheral Neuropathy - grade 2; definitely attributed Weight loss - grade 1(7% total weight loss) - definitely attributed   Constipation - grade 1 - possibly attributed

## 2014-11-19 NOTE — Progress Notes (Signed)
[facility] day:  11/19/2014   Chief Complaint: [name] [name] is a 64 y.o. female with recurrent breast cancer who is seen for assessment prior to day 1 of cycle #6 Halaven.  HPI: The patient was last seen in the medical oncology clinic on 10/29/2014.  At that time, restaging studies were reviewed.  Scans revealed improvement.  Exam revealed improving chest wall disease.  Symptomatically, she noted stable fatigue.  She continued to lose weight.  She began cycle #5.   During the interim, she has continued to lose weight.  She feels that her appetite is better by only drinking 1 can of Ensure instead of 2 cans a day.  She has less constipation.  She continues to be fatigued.  Her insomnia is at baseline.  She notes a new issue with her feet.  She has been unable to tell hot from cold water.  Feet are tender.  She feels less steady on her feet and a little off balance.  She feels her toes are "going down and around".  Regarding her chest wall disease, she continues to note improvement.  Past Medical History  Diagnosis Date  . Personal history of malignant neoplasm of breast   . Anemia   . Blood transfusion without reported diagnosis   . Emphysema of lung   . Breast cancer     Past Surgical History  Procedure Laterality Date  . Oophrectomy  1980  . Abdominal hysterectomy  1995  . Breast surgery Right 1996    mastectomy  . Breast biopsy Left 2014  . Breast biopsy Left 01-21-14    Family History  Problem Relation Age of Onset  . Cancer Father   . Heart disease Sister   . Diabetes Sister   . Heart disease Brother   . Cancer Paternal Grandmother     breast    Social History:  reports that she has been smoking Cigarettes.  She has a 25 pack-year smoking history. She has never used smokeless tobacco. She reports that she does not drink alcohol or use illicit drugs.  She would like to go on a cruise in 12/2014.  The patient is accompanied  by Karma Ganja, protocol nurse.  Allergies:  Allergies  Allergen Reactions  . Sulfa Antibiotics Nausea And Vomiting    headache    Current Medications: Current Outpatient Prescriptions  Medication Sig Dispense Refill  . Calcium Carbonate-Vitamin D (CALCIUM + D PO) Take 2 tablets by mouth daily.    Marland Kitchen KLOR-CON 10 10 MEQ tablet Take 1 tablet (10 mEq total) by mouth daily. 30 tablet 1  . LORazepam (ATIVAN) 0.5 MG tablet   0  . traMADol (ULTRAM) 50 MG tablet Take 1 tablet (50 mg total) by mouth every 8 (eight) hours as needed. 90 tablet 0   No current facility-administered medications for this visit.   Facility-Administered Medications Ordered in Other Visits  Medication Dose Route Frequency Provider Last Rate Last Dose  . sodium chloride 0.9 % injection 10 mL  10 mL Intracatheter PRN Lequita Asal, MD      . sodium chloride 0.9 % injection 10 mL  10 mL Intracatheter PRN Lequita Asal, MD   10 mL at 08/27/14 1400  . sodium chloride 0.9 % injection 10 mL  10 mL Intravenous PRN Lequita Asal, MD   10 mL at 10/08/14 1335  . sodium chloride 0.9 % injection 10 mL  10 mL Intracatheter PRN Melissa  Elmarie Mainland, MD        Review of Systems:  GENERAL:  Fatigue.  No fevers or sweats.  Weight loss of 2 pounds. PERFORMANCE STATUS (ECOG):  1 HEENT:  No visual changes, runny nose, sore throat, mouth sores or tenderness. Lungs: No shortness of breath or cough.  No hemoptysis. Cardiac:  No chest pain, palpitations, orthopnea, or PND. GI:  Appetite better with less Ensure.  Constipation immproved.  No nausea, vomiting, diarrhea, melena or hematochezia. GU:  No urgency, frequency, dysuria, or hematuria. Musculoskeletal:  No back pain.  No joint pain.  No muscle tenderness. Extremities:  No pain or swelling. Skin:  Hair thinning.  No rashes or skin changes. Neuro:  Unable to distinguish hot and cold water in feet.  Little off balance/less steady secondary to sensation in feet.  Toes  feel cool, but are warm to the touch.  No headache, numbness or weakness, or coordination issues. Endocrine:  No diabetes, thyroid issues, hot flashes or night sweats. Psych:  Insomnia.  No mood changes, depression or anxiety. Pain:  Stable pain treated with Tramadol. Review of systems:  All other systems reviewed and found to be negative.   Physical Exam: Blood pressure 134/77, pulse 89, temperature 95.3 F (35.2 C), temperature source Tympanic, resp. rate 18, height 5' 7.5" (1.715 m), weight 170 lb 4.9 oz (77.25 kg).  GENERAL: Well developed, well nourished, sitting comfortably in the exam room in no acute distress. MENTAL STATUS: Alert and oriented to person, place and time. HEAD: Short brown thinning hair with slight graying. Normocephalic, atraumatic, face symmetric, no Cushingoid features. EYES: Brown eyes. Pupils equal round and reactive to light and accomodation. No conjunctivitis or scleral icterus. ENT: Oropharynx clear without lesion. Tongue normal. Mucous membranes moist.  RESPIRATORY: Clear to auscultation without rales, wheezes or rhonchi. CARDIOVASCULAR: Regular rate and rhythm without murmur, rub or gallop. BREAST: Right chest wall lesions healed. Central upper sternal area remains flat.  Left lateral denuded area triangular in shape  measuring 5 x 3.5 cm in greatest dimension, but tapering (arrow head shape). ABDOMEN: Soft, non-tender, with active bowel sounds, and no hepatosplenomegaly. No masses. SKIN: Cutaneous breast cancer noted in breast section. No other lesions.  Long nails. EXTREMITIES: No edema, no skin discoloration or tenderness. No palpable cords. LYMPH NODES: Left axillary ridge less prominent. No palpable cervical, supraclavicular, axillary or inguinal adenopathy  NEUROLOGICAL: Unremarkable. PSYCH: Appropriate.  Infusion on 11/19/2014  Component Date Value Ref Range Status  . WBC 11/19/2014 3.6  3.6 - 11.0 K/uL Final  . RBC 11/19/2014  4.40  3.80 - 5.20 MIL/uL Final  . Hemoglobin 11/19/2014 13.0  12.0 - 16.0 g/dL Final  . HCT 11/19/2014 39.2  35.0 - 47.0 % Final  . MCV 11/19/2014 89.1  80.0 - 100.0 fL Final  . MCH 11/19/2014 29.6  26.0 - 34.0 pg Final  . MCHC 11/19/2014 33.2  32.0 - 36.0 g/dL Final  . RDW 11/19/2014 19.1* 11.5 - 14.5 % Final  . Platelets 11/19/2014 208  150 - 440 K/uL Final  . Neutrophils Relative % 11/19/2014 40   Final  . Neutro Abs 11/19/2014 1.4  1.4 - 6.5 K/uL Final  . Lymphocytes Relative 11/19/2014 39   Final  . Lymphs Abs 11/19/2014 1.4  1.0 - 3.6 K/uL Final  . Monocytes Relative 11/19/2014 20   Final  . Monocytes Absolute 11/19/2014 0.7  0.2 - 0.9 K/uL Final  . Eosinophils Relative 11/19/2014 0   Final  . Eosinophils Absolute  11/19/2014 0.0  0 - 0.7 K/uL Final  . Basophils Relative 11/19/2014 1   Final  . Basophils Absolute 11/19/2014 0.0  0 - 0.1 K/uL Final    Assessment:  [name] [name] is a 64 y.o. African American woman with a history of stage I right breast cancer s/p modified radical mastectomy on 06/12/1996. Pathology revealed a grade III mutifocal infiltrating ductal carcinoma (tumor size 8.5 cm with an invasive component 1.6 cm). Twenty-one lymph nodes were negative. Tumor was ER/PR negative. She received 4 cycles of AC at [facility]  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 PET scan on 05/22/2012 revealed multiple areas of disease. She received 6 cycles of Taxotere and Cytoxan (TC) from 06/06/2012 - 09/29/2012. PET scan on 09/07/2012 noted marked improvement. Post chemotherapy biopsy was positive. She was not a surgical candidate. She was placed on Femara.  PET scan on 05/03/2013 revealed a new focus along lateral left breast and a new level II cervical lymph node. She received radiation from 09/13/2013 - 11/05/2013. PET scan on 01/01/2014 revealed new nodule medial  left chest wall pleural/paraspinal activity LUL. Multiple biopsies on 01/19/2014 were positive. She began daily letrozole and Ibrance on 02/12/2014. Despite treatment, new nodules formed. She received a total of 2 cycles.   PET scan on 05/09/2014 revealed marked progression of disease with multifocal masses in chest wall (left > right). She did not receive Imbrance in 04/2014. CA27.29 has increased: 22.9 on 01/08/2014, 54 on 03/05/2014, 170.8 on 05/06/2014, 250.8 on 05/23/2014, 445.8 on 06/14/2014, and 531.1 on 07/01/2104.   She received 3 cycles of Xeloda (02/12 - 07/05/2014). Chest and abdomen CT scan on 07/05/2014 revealed persistent and slightly progressive diffuse locally invasive chest wall tumor. Bone scan on 07/25/2014 revealed no evidence of metastatic disease.   She is status post 5 cycles of Halaven (08/06/2014 - 10/29/2014) on ACCRU [id number] I. Cycle #1 was notable for an ANC of 200 on day 15. Cycle #2 was notable for grade II fatigue, brief grade I diarrhea, and decreased appetite.  Cycle #3 was notable for grade I fatigue, anorexia, and weight loss.  Cycle #4 and #5 was notable for fatigue/lack of stamina, ongoing weight loss, and transient cold neuropathy in her feet.  Chest, abdomen, and pelvic CT scan on 10/24/2014 revealed improved multifocal inflammatory left breast cancer.  The dominant 2.6 x 3.5 cm lesion in the left upper outer left breast has decreased to 1.7 x 3.5 cm. The anterior sternal lesion has decreased from 2 x 4 cm to 1.5 x 3.9 cm. There were no suspicious mediastinal, hilar or axillary adenopathy.  Symptomatically, she remains fatigued and continues to lose weight.  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  Exam reveals improving chest wall disease.  Plan: 1. Labs today: CBC with diff, CMP. 2. Day 1 of cycle #6 Halaven.  Dose level -1 reduction (1.1 mg/m2) secondary to neuropathy. 3. Medical photographs. 4. RTC in 1 week for labs and day  8 of cycle #6 Halaven. 5. Discuss caloric intake and consideration of Megace for appetite stimulation. 6. RTC in 3 weeks for MD assessment, labs (CBC with diff, CMP, CA27.29), and cycle #7 Halaven.   Lequita Asal, MD  11/19/2014, 5:16 PM

## 2014-11-25 ENCOUNTER — Encounter: Payer: Medicare Other | Attending: Surgery | Admitting: Surgery

## 2014-11-25 DIAGNOSIS — X58XXXA Exposure to other specified factors, initial encounter: Secondary | ICD-10-CM | POA: Diagnosis not present

## 2014-11-25 DIAGNOSIS — S21002A Unspecified open wound of left breast, initial encounter: Secondary | ICD-10-CM | POA: Insufficient documentation

## 2014-11-25 DIAGNOSIS — C50812 Malignant neoplasm of overlapping sites of left female breast: Secondary | ICD-10-CM | POA: Diagnosis not present

## 2014-11-25 DIAGNOSIS — S21001A Unspecified open wound of right breast, initial encounter: Secondary | ICD-10-CM | POA: Diagnosis not present

## 2014-11-25 DIAGNOSIS — C50412 Malignant neoplasm of upper-outer quadrant of left female breast: Secondary | ICD-10-CM | POA: Insufficient documentation

## 2014-11-25 NOTE — Progress Notes (Addendum)
[name] [name], [name] [name] ([id number]) Visit Report for 11/25/2014 Chief Complaint Document Details Patient Name: [name] [name], [name] [name] 11/25/2014 3:45 Date of Service: PM Medical Record [id number] Number: Patient Account Number: [id number] November 01, 1950 (64 y.o. 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 07/18/2014 -- since last week the patient has had [name] complaints and is on her last cycle of chemotherapy. I have reviewed 155 pages of her reports from a medical oncologist and the summary is made in the history of present illness. Electronic Signature(s) Signed: 11/25/2014 4:13:56 PM By: [name] Fudge MD, FACS Entered By: [name] [name] on 11/25/2014 16:13:56 [name] [name] ([id number]) -------------------------------------------------------------------------------- HPI Details Patient Name: [name] [name], [name] [name] 11/25/2014 3:45 Date of Service: PM Medical Record [id number] Number: Patient Account Number: [id number] [birth year] (64 y.o. Treating RN: [name] [name] Date of Birth/Sex: Female) Other Clinician: Primary Care Physician: [name] [name] Treating [name] [name] Referring Physician: Physician/Extender: [name] [name] in Treatment: 19 History of Present Illness HPI Description: this 64 year old patient has come by herself today as a self-referral and has [name] documentation with her. She has had open wounds to her left chest and the right chest wall for about 2 months. She is not a diabetic and has [name] significant medical history except breast cancer. Her right breast cancer was diagnosed 18  years ago and she had a mastectomy and chemotherapy. 3 years ago she was then diagnosed with a breast cancer of the left side which was advanced and could not be operated and was given chemotherapy for a year and then followed by radiation. Her last radiation was over a year ago. She has not had any biopsy done recently from the ulcerated area but she says there have been other biopsies done from the chest wall and these reports are not available at the present time. She was told to keep the wounds open and let them dry out but she is finding it's more and more difficult to stop soiling her clothes if she leaves wounds open. She is on chemotherapy and we will try and obtain these notes from her oncologist. She is not in pain and does not have any significant problems except the discomfort from an open wound on the chest wall. 07/18/2014 This is a summary of [name] [name] date of birth Mar 25, 1951. These are obtained by reviewing 151 pages of medical records which were obtained from the [facility] 64 year old patient who had a history of stage I right breast cancer status post modified radical mastectomy on 06/12/1996. 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 She received 4 cycles of AC at [facility]. In January 2014 she presented with left breast swelling and apparently cellulitis. Breast biopsy done revealed lobular carcinoma which was ER and PR positive and HER-2/neu negative. On PET scan and she was shown to have multiple areas of disease left breast, axilla and supraclavicular areas. She received 6 cycles of chemotherapy and then received Femara because she was not a surgical candidate. PET scan also revealed disease in the left lateral breast and new cervical lymph nodes at level II. The patient was then sent for radiation  therapy and received this over a month. In January 2016 she had a PET CT scan done which was compatible  with marked progression of disease with multifocal soft tissue masses in the chest wall left greater than right. She is currently receiving Xeloda and is noted to have disease in the chest wall which is widely present. Her most recent lab work from 07/02/2014 shows that her CMP is within normal limits with a serum albumin of 4.4. The previous CBC was within normal limits with a WBC count of 4.3 hemoglobin of 14 hematocrit of 40.9 and platelets of 202. 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 We will continue with palliative care and make her comfortable as much as possible as far as her wound care goes. Present time there is [name] odor but if this does occur we would use carbon- based products to mask the odor. [name] [name], [name] [name] ([id number]) Notes were also reviewed from her surgeon Dr. Hervey [name] --closure on 05/27/2014 for breast biopsy in view of the fact that she had extensive disease on her chest wall. Biopsies are taken from the nodular metastatic disease overlying the sternum and nodules chosen for biopsy. Punch biopsies were taken and the pathology on this confirmed that this was consistent with carcinoma 08/15/2014 -- she is started on a new course of injectable chemotherapy and this is 2 weeks on and 2 weeks off. She has [name] new symptoms [name] bleeding from the wounds nor is there any odor. She is doing fine otherwise. 09/16/2014. She is doing very well and the right side wounds have completely healed. She has finished 2 cycles of chemotherapy and she is 2 weeks on and 2 weeks off. He is to restart her chemotherapy this week. [name] fresh complaints pain is within control and there is [name] odor from the wounds. 10/21/2014 -- overall she is doing very well and is on a clinical trial with her  medical oncologist. [name] issues with the wound on her left side and there is [name] odor or drainage. 11/25/2014 -- she continues to be under clinical trial protocol with the medical oncologist and has [name] fresh issues. Electronic Signature(s) Signed: 11/25/2014 4:14:22 PM By: [name] Fudge MD, FACS Entered By: [name] [name] on 11/25/2014 16:14:22 [name] [name] ([id number]) -------------------------------------------------------------------------------- Physical Exam Details Patient Name: [name] [name], [name] [name] 11/25/2014 3:45 Date of Service: PM Medical Record [id number] Number: Patient Account Number: [id number] 1951/02/25 (64 y.o. Treating RN: [name] [name] Date of Birth/Sex: Female) Other Clinician: Primary Care Physician: [name] [name] Treating Kam Rahimi Referring Physician: Physician/Extender: [name] [name] in Treatment: 19 Constitutional . Pulse regular. Respirations normal and unlabored. Afebrile. . Eyes Nonicteric. Reactive to light. Ears, Nose, Mouth, and Throat Lips, teeth, and gums WNL.Marland Kitchen Moist mucosa without lesions . Neck supple and nontender. [name] palpable supraclavicular or cervical adenopathy. Normal sized without goiter. Respiratory WNL. [name] retractions.. Cardiovascular Pedal Pulses WNL. [name] clubbing, cyanosis or edema. Chest the right breast does not show any open ulcerations. The left breast and axillary area has a significant ulceration which is small in size and has minimal slough. Rest of the skin around it looks supple and not inflamed.. Breast tissue WNL, [name] masses, lumps, or tenderness.. Lymphatic [name] adneopathy. [name] adenopathy. [name] adenopathy. Musculoskeletal Adexa without tenderness or enlargement.. Digits and nails w/o clubbing, cyanosis, infection, petechiae,  ischemia, or inflammatory conditions.. Integumentary (Hair, Skin) [name] suspicious lesions. [name] crepitus or fluctuance. [name] peri-wound warmth or erythema. [name] masses.Marland Kitchen Psychiatric Judgement and insight Intact.. [name]  evidence of depression, anxiety, or agitation.. Electronic Signature(s) Signed: 11/25/2014 4:15:02 PM By: [name] Fudge MD, FACS Entered By: [name] [name] on 11/25/2014 16:15:00 [name] [name] ([id number]) -------------------------------------------------------------------------------- Physician Orders Details Patient Name: [name] [name], [name] [name] 11/25/2014 3:45 Date of Service: PM Medical Record [id number] Number: Patient Account Number: [id number] [birth year] (64 y.o. Treating RN: Cornell Barman Date of Birth/Sex: Female) Other Clinician: Primary Care Physician: [name] [name] Treating Galen Malkowski Referring Physician: Physician/Extender: [name] [name] in Treatment: 41 Verbal / Phone Orders: Yes Clinician: Cornell Barman Read Back and Verified: Yes Diagnosis Coding ICD-10 Coding Code Description C50.812 Malignant neoplasm of overlapping sites of left female breast S21.001A Unspecified open wound of right breast, initial encounter C50.412 Malignant neoplasm of upper-outer quadrant of left female breast S21.002A Unspecified open wound of left breast, initial encounter Wound Cleansing Wound #1 Left Chest o Clean wound with Normal Saline. Anesthetic Wound #1 Left Chest o Topical Lidocaine 4% cream applied to wound bed prior to debridement Skin Barriers/Peri-Wound Care Wound #1 Left Chest o Skin Prep Primary Wound Dressing Wound #1 Left Chest o Aquacel Ag Secondary Dressing Wound #1 Left Chest o Boardered Foam Dressing Dressing Change Frequency Wound #1 Left Chest o Change dressing every other day. - or more if needed Follow-up Appointments [name] [name], [name] [name] ([id number]) Wound #1 Left Chest o Return Appointment in 1 month Electronic Signature(s) Signed: 11/25/2014 6:31:19 PM By: Gretta Cool RN, BSN, Kim RN, BSN Signed: 11/26/2014 12:24:58 PM By: [name] Fudge MD, FACS Entered By: Gretta Cool RN, BSN, Kim on 11/25/2014 17:37:15 [name] [name]  ([id number]) -------------------------------------------------------------------------------- Problem List Details Patient Name: [name] [name], [name] [name] 11/25/2014 3:45 Date of Service: PM Medical Record [id number] Number: Patient Account Number: [id number] 1950/10/31 (64 y.o. Treating RN: [name] [name] Date of Birth/Sex: Female) Other Clinician: Primary Care Physician: [name] [name] Treating Zahki Hoogendoorn Referring Physician: Physician/Extender: [name] [name] in Treatment: 19 Active Problems ICD-10 Encounter Code Description Active Date Diagnosis C50.812 Malignant neoplasm of overlapping sites of left female 07/11/2014 Yes breast S21.001A Unspecified open wound of right breast, initial encounter 07/11/2014 Yes C50.412 Malignant neoplasm of upper-outer quadrant of left female 07/11/2014 Yes breast S21.002A Unspecified open wound of left breast, initial encounter 07/11/2014 Yes Inactive Problems Resolved Problems Electronic Signature(s) Signed: 11/25/2014 4:13:49 PM By: [name] Fudge MD, FACS Entered By: [name] [name] on 11/25/2014 16:13:49 [name] [name] ([id number]) -------------------------------------------------------------------------------- Progress Note Details Patient Name: [name] [name]. 11/25/2014 3:45 Date of Service: PM Medical Record [id number] Number: Patient Account Number: [id number] [birth year] (64 y.o. 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 07/18/2014 -- since last week the patient has had [name] complaints and is on her last cycle of  chemotherapy. I have reviewed 155 pages of her reports from a medical oncologist and the summary is made in the history of present illness. History of Present Illness (HPI) this 64 year old patient has come by herself today as a self-referral and has [name] documentation with her. She has had open wounds to her left chest and the right  chest wall for about 2 months. She is not a diabetic and has [name] significant medical history except breast cancer. Her right breast cancer was diagnosed 18 years ago and she had a mastectomy and chemotherapy. 3 years ago she was then diagnosed with a breast cancer of the left side which was advanced and could not be operated and was given chemotherapy for a year and then followed by radiation. Her last radiation was over a year ago. She has not had any biopsy done recently from the ulcerated area but she says there have been other biopsies done from the chest wall and these reports are not available at the present time. She was told to keep the wounds open and let them dry out but she is finding it's more and more difficult to stop soiling her clothes if she leaves wounds open. She is on chemotherapy and we will try and obtain these notes from her oncologist. She is not in pain and does not have any significant problems except the discomfort from an open wound on the chest wall. 07/18/2014 This is a summary of [name] [name] date of birth 02-08-1951. These are obtained by reviewing 151 pages of medical records which were obtained from the [facility] 64 year old patient who had a history of stage I right breast cancer status post modified radical mastectomy on 06/12/1996. 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 She received 4 cycles of AC at [facility]. In January 2014 she presented with left breast swelling and apparently cellulitis. Breast biopsy done revealed lobular carcinoma which was ER and PR  positive and HER-2/neu negative. On PET scan and she was shown to have multiple areas of disease left breast, axilla and supraclavicular areas. She received 6 cycles of chemotherapy and then received Femara because she was not a surgical candidate. PET scan also revealed disease in the left lateral breast and new cervical lymph nodes at level II. The patient was then sent for radiation therapy and received this over a month. In January 2016 she had a PET CT scan done which was compatible with marked progression of disease with multifocal soft tissue masses in the chest wall left greater than right. [name] [name], GROSSHANS. ([id number]) She is currently receiving Xeloda and is noted to have disease in the chest wall which is widely present. Her most recent lab work from 07/02/2014 shows that her CMP is within normal limits with a serum albumin of 4.4. The previous CBC was within normal limits with a WBC count of 4.3 hemoglobin of 14 hematocrit of 40.9 and platelets of 202. 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 We will continue with palliative care and make her comfortable as much as possible as far as her wound care goes. Present time there is [name] odor but if this does occur we would use carbon- based products to mask the odor. Notes were also reviewed from her surgeon Dr. Hervey [name] --closure on 05/27/2014 for breast biopsy in view of the fact that she had extensive disease on her chest wall. Biopsies are taken from the nodular metastatic disease overlying the sternum and nodules chosen for biopsy. Punch biopsies were taken and the pathology on this confirmed that this was consistent with carcinoma 08/15/2014 -- she is  started on a new course of injectable chemotherapy and this is 2 weeks on and  2 weeks off. She has [name] new symptoms [name] bleeding from the wounds nor is there any odor. She is doing fine otherwise. 09/16/2014. She is doing very well and the right side wounds have completely healed. She has finished 2 cycles of chemotherapy and she is 2 weeks on and 2 weeks off. He is to restart her chemotherapy this week. [name] fresh complaints pain is within control and there is [name] odor from the wounds. 10/21/2014 -- overall she is doing very well and is on a clinical trial with her medical oncologist. [name] issues with the wound on her left side and there is [name] odor or drainage. 11/25/2014 -- she continues to be under clinical trial protocol with the medical oncologist and has [name] fresh issues. Objective Constitutional Pulse regular. Respirations normal and unlabored. Afebrile. Vitals Time Taken: 4:00 PM, Height: 68 in, Weight: 180.5 lbs, BMI: 27.4, Temperature: 98.7 F, Pulse: 89 bpm, Respiratory Rate: 18 breaths/min, Blood Pressure: 133/73 mmHg. Eyes Nonicteric. Reactive to light. Ears, Nose, Mouth, and Throat Lips, teeth, and gums WNL.Marland Kitchen Moist mucosa without lesions . Neck supple and nontender. [name] palpable supraclavicular or cervical adenopathy. Normal sized without goiter. [name] [name], VENO. ([id number]) Respiratory WNL. [name] retractions.. Cardiovascular Pedal Pulses WNL. [name] clubbing, cyanosis or edema. Chest the right breast does not show any open ulcerations. The left breast and axillary area has a significant ulceration which is small in size and has minimal slough. Rest of the skin around it looks supple and not inflamed.. Breast tissue WNL, [name] masses, lumps, or tenderness.. Lymphatic [name] adneopathy. [name] adenopathy. [name] adenopathy. Musculoskeletal Adexa without tenderness or enlargement.. Digits and nails w/o clubbing, cyanosis, infection, petechiae, ischemia, or inflammatory conditions.Marland Kitchen Psychiatric Judgement and insight Intact.. [name] evidence of depression, anxiety, or  agitation.. Integumentary (Hair, Skin) [name] suspicious lesions. [name] crepitus or fluctuance. [name] peri-wound warmth or erythema. [name] masses.. Wound #1 status is Open. Original cause of wound was Other Lesion. The wound is located on the Left Chest. The wound measures 2.5cm length x 5.5cm width x 0.1cm depth; 10.799cm^2 area and 1.08cm^3 volume. The wound is limited to skin breakdown. There is [name] tunneling or undermining noted. There is a small amount of serosanguineous drainage noted. The wound margin is distinct with the outline attached to the wound base. There is medium (34-66%) red granulation within the wound bed. There is a medium (34- 66%) amount of necrotic tissue within the wound bed including Adherent Slough. The periwound skin appearance had [name] abnormalities noted for moisture. The periwound skin appearance had [name] abnormalities noted for color. The periwound skin appearance exhibited: Induration, Scarring. The periwound skin appearance did not exhibit: Callus, Crepitus, Excoriation, Fluctuance, Friable, Localized Edema, Rash. Periwound temperature was noted as [name] Abnormality. Assessment Active Problems ICD-10 C50.812 - Malignant neoplasm of overlapping sites of left female breast S21.001A - Unspecified open wound of right breast, initial encounter C50.412 - Malignant neoplasm of upper-outer quadrant of left female breast S21.002A - Unspecified open wound of left breast, initial encounter [name] [name], SIMER. ([id number]) Under the present chemotherapy protocol her wounds have gotten much better and she has minimal open area with minimal slough. We will continue to use silver alginate and support her with palliative care. She will come back in 3-4 weeks as needed. Plan Wound Cleansing: Wound #1 Left Chest: Clean wound with Normal Saline. Anesthetic: Wound #1 Left Chest: Topical Lidocaine 4% cream applied to wound  bed prior to debridement Skin Barriers/Peri-Wound Care: Wound #1 Left  Chest: Skin Prep Primary Wound Dressing: Wound #1 Left Chest: Aquacel Ag Secondary Dressing: Wound #1 Left Chest: Boardered Foam Dressing Dressing Change Frequency: Wound #1 Left Chest: Change dressing every other day. - or more if needed Follow-up Appointments: Wound #1 Left Chest: Return Appointment in 1 month Under the present chemotherapy protocol her wounds have gotten much better and she has minimal open area with minimal slough. We will continue to use silver alginate and support her with palliative care. She will come back in 3-4 weeks as needed. Electronic Signature(s) Signed: 11/26/2014 7:52:31 AM By: [name] Fudge MD, FACS Previous Signature: 11/26/2014 7:52:19 AM Version By: [name] Fudge MD, FACS Previous Signature: 11/25/2014 4:16:00 PM Version By: [name] Fudge MD, FACS [name] [name] ([id number]) Entered By: [name] [name] on 11/26/2014 07:52:31 [name] [name] ([id number]) -------------------------------------------------------------------------------- SuperBill Details Patient Name: [name] [name] Date of Service: 11/25/2014 Medical Record Number: [id number] Patient Account Number: [id number] Date of Birth/Sex: 04-08-1951 (63 y.o. Female) Treating RN: [name] [name] Primary Care Physician: [name] [name] Other Clinician: Referring Physician: Treating Physician/Extender: Frann Rider in Treatment: 19 Diagnosis Coding ICD-10 Codes Code Description C50.812 Malignant neoplasm of overlapping sites of left female breast S21.001A Unspecified open wound of right breast, initial encounter C50.412 Malignant neoplasm of upper-outer quadrant of left female breast S21.002A Unspecified open wound of left breast, initial encounter Facility Procedures CPT4 Code: [id number] Description: [phone] - WOUND CARE VISIT-LEV 2 EST PT Modifier: Quantity: 1 Physician Procedures CPT4 Code Description: [id number] - WC PHYS LEVEL 3 - EST PT ICD-10 Description Diagnosis C50.812  Malignant neoplasm of overlapping sites of left S21.001A Unspecified open wound of right breast, initial C50.412 Malignant neoplasm of upper-outer  quadrant of le S21.002A Unspecified open wound of left breast, initial e Modifier: female breast encounter ft female brea ncounter Quantity: 1 st Electronic Signature(s) Signed: 11/25/2014 6:31:19 PM By: Gretta Cool, RN, BSN, Kim RN, BSN Signed: 11/26/2014 12:24:58 PM By: [name] Fudge MD, FACS Previous Signature: 11/25/2014 4:16:19 PM Version By: [name] Fudge MD, FACS Entered By: Gretta Cool, RN, BSN, Kim on 11/25/2014 17:38:02

## 2014-11-26 ENCOUNTER — Other Ambulatory Visit: Payer: Medicare Other

## 2014-11-26 ENCOUNTER — Inpatient Hospital Stay: Payer: Medicare Other

## 2014-11-26 ENCOUNTER — Encounter: Payer: Self-pay | Admitting: Internal Medicine

## 2014-11-26 ENCOUNTER — Encounter: Payer: Self-pay | Admitting: *Deleted

## 2014-11-26 VITALS — BP 122/81 | HR 89 | Temp 96.9°F | Resp 18

## 2014-11-26 DIAGNOSIS — C50912 Malignant neoplasm of unspecified site of left female breast: Secondary | ICD-10-CM

## 2014-11-26 DIAGNOSIS — Z5111 Encounter for antineoplastic chemotherapy: Secondary | ICD-10-CM | POA: Diagnosis not present

## 2014-11-26 LAB — COMPREHENSIVE METABOLIC PANEL
ALT: 26 U/L (ref 14–54)
AST: 28 U/L (ref 15–41)
Albumin: 4 g/dL (ref 3.5–5.0)
Alkaline Phosphatase: 78 U/L (ref 38–126)
Anion gap: 7 (ref 5–15)
BUN: 18 mg/dL (ref 6–20)
CO2: 24 mmol/L (ref 22–32)
Calcium: 8.5 mg/dL — ABNORMAL LOW (ref 8.9–10.3)
Chloride: 103 mmol/L (ref 101–111)
Creatinine, Ser: 0.91 mg/dL (ref 0.44–1.00)
GFR calc Af Amer: >60 mL/min (ref >60)
GFR calc non Af Amer: >60 mL/min (ref >60)
Glucose, Bld: 95 mg/dL (ref 65–99)
Potassium: 3.5 mmol/L (ref 3.5–5.1)
Sodium: 134 mmol/L — ABNORMAL LOW (ref 135–145)
Total Bilirubin: 0.5 mg/dL (ref 0.3–1.2)
Total Protein: 7.7 g/dL (ref 6.5–8.1)

## 2014-11-26 LAB — CBC WITH DIFFERENTIAL/PLATELET
Basophils Absolute: 0 10*3/uL (ref 0–0.1)
Basophils Relative: 0 %
Eosinophils Absolute: 0 10*3/uL (ref 0–0.7)
Eosinophils Relative: 0 %
HCT: 37.9 % (ref 35.0–47.0)
Hemoglobin: 12.6 g/dL (ref 12.0–16.0)
Lymphocytes Relative: 32 %
Lymphs Abs: 1.6 10*3/uL (ref 1.0–3.6)
MCH: 29.2 pg (ref 26.0–34.0)
MCHC: 33.4 g/dL (ref 32.0–36.0)
MCV: 87.6 fL (ref 80.0–100.0)
Monocytes Absolute: 0.4 10*3/uL (ref 0.2–0.9)
Monocytes Relative: 9 %
Neutro Abs: 2.9 10*3/uL (ref 1.4–6.5)
Neutrophils Relative %: 59 %
Platelets: 195 10*3/uL (ref 150–440)
RBC: 4.32 MIL/uL (ref 3.80–5.20)
RDW: 18.8 % — ABNORMAL HIGH (ref 11.5–14.5)
WBC: 5 10*3/uL (ref 3.6–11.0)

## 2014-11-26 MED ORDER — SODIUM CHLORIDE 0.9 % IV SOLN
Freq: Once | INTRAVENOUS | Status: AC
  Administered 2014-11-26: 16:00:00 via INTRAVENOUS
  Filled 2014-11-26: qty 1000

## 2014-11-26 MED ORDER — INV-ERIBULIN CHEMO INJECTION 1 MG/2ML RU011201I ACCRU
1.1000 mg/m2 | Freq: Once | INTRAVENOUS | Status: AC
  Administered 2014-11-26: 2.15 mg via INTRAVENOUS
  Filled 2014-11-26: qty 4.3

## 2014-11-26 MED ORDER — HEPARIN SOD (PORK) LOCK FLUSH 100 UNIT/ML IV SOLN
500.0000 [IU] | Freq: Once | INTRAVENOUS | Status: AC
  Administered 2014-11-26: 500 [IU] via INTRAVENOUS

## 2014-11-26 MED ORDER — SODIUM CHLORIDE 0.9 % IV SOLN
Freq: Once | INTRAVENOUS | Status: AC
  Administered 2014-11-26: 16:00:00 via INTRAVENOUS
  Filled 2014-11-26: qty 4

## 2014-11-26 MED ORDER — SODIUM CHLORIDE 0.9 % IJ SOLN
10.0000 mL | INTRAMUSCULAR | Status: AC | PRN
  Administered 2014-11-26: 10 mL
  Filled 2014-11-26: qty 10

## 2014-11-26 NOTE — Progress Notes (Signed)
11/26/14 @14 :39 [name] [name] returns to clinic this afternoon for consideration of C6D8 Eribulin infusion. Reports she has been doing about the same until yesterday, when she began to really feel good. Reviewed [employer] phone survey with patient and she had described her decreased appetite and numbness and tingling as severe. Today she reports that her appetite improved significantly yesterday and the neuropathy, which is mostly in her feet, decreased as well. Prior to that, patient states she really could not tell a difference since receiving a reduced dose of Eribuilin last week, and states the severety was about the same as before. [name] reports that her other AEs including fatigue, alopecia, insomnia, weight loss and constipation are essentially unchanged since last visit. Her VS are stable. CBC results are within acceptable parameters to proceed with planned Eirbulin infusion. Will continue at level -1 dose reduction of 1.1mg /m2; total dose 2.15mg , which  was reduced by [name] last week due to patient's worsening peripheral neuropathy. All AEs including grade and attribution noted below.     Decreased appetite - grade 1; definitely attributed Fatigue - grade 1; deifnitely attributed Alopecia - grade 1; definitely attributed Insomnia - grade 1; unlikely related Peripheral Neuropathy - grade 2; definitely attributed - reports improved since last week Weight loss - grade 1(7% total weight loss) - definitely attributed Constipation - grade 1 - possibly attributed

## 2014-11-26 NOTE — Progress Notes (Signed)
[name], [name] ([id number]) Visit Report for 11/25/2014 Arrival Information Details Patient Name: [name], [name] Date of Service: 11/25/2014 3:45 PM Medical Record Number: [id number] Patient Account Number: [id number] Date of Birth/Sex: 07-23-1950 (64 y.o. Female) Treating RN: Cornell Barman Primary Care Physician: PATIENT, NO Other Clinician: Referring Physician: Treating Physician/Extender: Frann Rider in Treatment: 95 Visit Information History Since Last Visit Added or deleted any medications: No Patient Arrived: Ambulatory Any new allergies or adverse reactions: No Arrival Time: 16:00 Had a fall or experienced change in No Accompanied By: self activities of daily living that may affect Transfer Assistance: None risk of falls: Patient Identification Verified: Yes Signs or symptoms of abuse/neglect since last No Patient Requires Transmission-Based No visito Precautions: Hospitalized since last visit: No Patient Has Alerts: No Has Dressing in Place as Prescribed: Yes Pain Present Now: No Electronic Signature(s) Signed: 11/25/2014 6:31:19 PM By: Gretta Cool, RN, BSN, Kim RN, BSN Entered By: Gretta Cool, RN, BSN, Kim on 11/25/2014 16:01:27 [name] [name] ([id number]) -------------------------------------------------------------------------------- Clinic Level of Care Assessment Details Patient Name: [name] [name] Date of Service: 11/25/2014 3:45 PM Medical Record Number: [id number] Patient Account Number: [id number] Date of Birth/Sex: 12-15-1950 (64 y.o. Female) Treating RN: Cornell Barman Primary Care Physician: PATIENT, NO Other Clinician: Referring Physician: Treating Physician/Extender: Frann Rider in Treatment: 19 Clinic Level of Care Assessment Items TOOL 4 Quantity Score []  - Use when only an EandM is performed on FOLLOW-UP visit 0 ASSESSMENTS - Nursing Assessment / Reassessment []  - Reassessment of Co-morbidities (includes updates in patient status) 0 X -  Reassessment of Adherence to Treatment Plan 1 5 ASSESSMENTS - Wound and Skin Assessment / Reassessment X - Simple Wound Assessment / Reassessment - one wound 1 5 []  - Complex Wound Assessment / Reassessment - multiple wounds 0 []  - Dermatologic / Skin Assessment (not related to wound area) 0 ASSESSMENTS - Focused Assessment []  - Circumferential Edema Measurements - multi extremities 0 []  - Nutritional Assessment / Counseling / Intervention 0 []  - Lower Extremity Assessment (monofilament, tuning fork, pulses) 0 []  - Peripheral Arterial Disease Assessment (using hand held doppler) 0 ASSESSMENTS - Ostomy and/or Continence Assessment and Care []  - Incontinence Assessment and Management 0 []  - Ostomy Care Assessment and Management (repouching, etc.) 0 PROCESS - Coordination of Care X - Simple Patient / Family Education for ongoing care 1 15 []  - Complex (extensive) Patient / Family Education for ongoing care 0 []  - Staff obtains Programmer, systems, Records, Test Results / Process Orders 0 []  - Staff telephones HHA, Nursing Homes / Clarify orders / etc 0 []  - Routine Transfer to another Facility (non-emergent condition) 0 [name] ([id number]) []  - Routine Hospital Admission (non-emergent condition) 0 []  - New Admissions / Biomedical engineer / Ordering NPWT, Apligraf, etc. 0 []  - Emergency Hospital Admission (emergent condition) 0 X - Simple Discharge Coordination 1 10 []  - Complex (extensive) Discharge Coordination 0 PROCESS - Special Needs []  - Pediatric / Minor Patient Management 0 []  - Isolation Patient Management 0 []  - Hearing / Language / Visual special needs 0 []  - Assessment of Community assistance (transportation, D/C planning, etc.) 0 []  - Additional assistance / Altered mentation 0 []  - Support Surface(s) Assessment (bed, cushion, seat, etc.) 0 INTERVENTIONS - Wound Cleansing / Measurement X - Simple Wound Cleansing - one wound 1 5 []  - Complex Wound Cleansing - multiple  wounds 0 X - Wound Imaging (photographs - any number of wounds) 1 5 []  - Wound Tracing (instead of photographs)  0 X - Simple Wound Measurement - one wound 1 5 []  - Complex Wound Measurement - multiple wounds 0 INTERVENTIONS - Wound Dressings X - Small Wound Dressing one or multiple wounds 1 10 []  - Medium Wound Dressing one or multiple wounds 0 []  - Large Wound Dressing one or multiple wounds 0 []  - Application of Medications - topical 0 []  - Application of Medications - injection 0 INTERVENTIONS - Miscellaneous []  - External ear exam 0 [name], [name] ([id number]) []  - Specimen Collection (cultures, biopsies, blood, body fluids, etc.) 0 []  - Specimen(s) / Culture(s) sent or taken to Lab for analysis 0 []  - Patient Transfer (multiple staff / Harrel Lemon Lift / Similar devices) 0 []  - Simple Staple / Suture removal (25 or less) 0 []  - Complex Staple / Suture removal (26 or more) 0 []  - Hypo / Hyperglycemic Management (close monitor of Blood Glucose) 0 []  - Ankle / Brachial Index (ABI) - do not check if billed separately 0 X - Vital Signs 1 5 Has the patient been seen at the hospital within the last three years: Yes Total Score: 65 Level Of Care: New/Established - Level 2 Electronic Signature(s) Signed: 11/25/2014 6:31:19 PM By: Gretta Cool, RN, BSN, Kim RN, BSN Entered By: Gretta Cool, RN, BSN, Kim on 11/25/2014 17:37:49 [name] [name] ([id number]) -------------------------------------------------------------------------------- Encounter Discharge Information Details Patient Name: [name] [name] Date of Service: 11/25/2014 3:45 PM Medical Record Number: [id number] Patient Account Number: [id number] Date of Birth/Sex: 10/29/1950 (64 y.o. Female) Treating RN: Cornell Barman Primary Care Physician: PATIENT, NO Other Clinician: Referring Physician: Treating Physician/Extender: Frann Rider in Treatment: 69 Encounter Discharge Information Items Discharge Pain Level: 0 Discharge Condition:  Stable Ambulatory Status: Ambulatory Discharge Destination: Home Private Transportation: Auto Accompanied By: self Schedule Follow-up Appointment: Yes Medication Reconciliation completed and Yes provided to Patient/Care [name] [name]: Clinical Summary of Care: Electronic Signature(s) Signed: 11/25/2014 6:31:19 PM By: Gretta Cool, RN, BSN, Kim RN, BSN Entered By: Gretta Cool, RN, BSN, Kim on 11/25/2014 17:39:00 [name] [name] ([id number]) -------------------------------------------------------------------------------- Multi Wound Chart Details Patient Name: [name] [name] Date of Service: 11/25/2014 3:45 PM Medical Record Number: [id number] Patient Account Number: [id number] Date of Birth/Sex: [birth year] (63 y.o. Female) Treating RN: Cornell Barman Primary Care Physician: PATIENT, NO Other Clinician: Referring Physician: Treating Physician/Extender: Frann Rider in Treatment: 19 Vital Signs Height(in): 68 Pulse(bpm): 89 Weight(lbs): 180.5 Blood Pressure 133/73 (mmHg): Body Mass Index(BMI): 27 Temperature(F): 98.7 Respiratory Rate 18 (breaths/min): Photos: [1:No Photos] [N/A:N/A] Wound Location: [1:Left Chest] [N/A:N/A] Wounding Event: [1:Other Lesion] [N/A:N/A] Primary Etiology: [1:Malignant Wound] [N/A:N/A] Comorbid History: [1:Received Chemotherapy, Received Radiation] [N/A:N/A] Date Acquired: [1:05/10/2014] [N/A:N/A] Weeks of Treatment: [1:19] [N/A:N/A] Wound Status: [1:Open] [N/A:N/A] Measurements L x W x D 2.5x5.5x0.1 [N/A:N/A] (cm) Area (cm) : [1:10.799] [N/A:N/A] Volume (cm) : [1:1.08] [N/A:N/A] % Reduction in Area: [1:54.20%] [N/A:N/A] % Reduction in Volume: 77.10% [N/A:N/A] Classification: [1:Full Thickness Without Exposed Support Structures] [N/A:N/A] Exudate Amount: [1:Small] [N/A:N/A] Exudate Type: [1:Serosanguineous] [N/A:N/A] Exudate Color: [1:red, brown] [N/A:N/A] Wound Margin: [1:Distinct, outline attached] [N/A:N/A] Granulation Amount: [1:Medium  (34-66%)] [N/A:N/A] Granulation Quality: [1:Red] [N/A:N/A] Necrotic Amount: [1:Medium (34-66%)] [N/A:N/A] Exposed Structures: [1:Fascia: No Fat: No Tendon: No Muscle: No Joint: No] [N/A:N/A] Bone: No Limited to Skin Breakdown Epithelialization: None N/A N/A Periwound Skin Texture: Induration: Yes N/A N/A Scarring: Yes Edema: No Excoriation: No Callus: No Crepitus: No Fluctuance: No Friable: No Rash: No Periwound Skin No Abnormalities Noted N/A N/A Moisture: Periwound Skin Color: No Abnormalities Noted N/A N/A Temperature: No Abnormality N/A N/A Tenderness on  No N/A N/A Palpation: Wound Preparation: Ulcer Cleansing: N/A N/A Rinsed/Irrigated with Saline Topical Anesthetic Applied: None Treatment Notes Electronic Signature(s) Signed: 11/25/2014 6:31:19 PM By: Gretta Cool, RN, BSN, Kim RN, BSN Entered By: Gretta Cool, RN, BSN, Kim on 11/25/2014 17:36:51 [name] [name] ([id number]) -------------------------------------------------------------------------------- Multi-Disciplinary Care Plan Details Patient Name: [name] [name] Date of Service: 11/25/2014 3:45 PM Medical Record Number: [id number] Patient Account Number: [id number] Date of Birth/Sex: Nov 12, 1950 (63 y.o. Female) Treating RN: Cornell Barman Primary Care Physician: PATIENT, NO Other Clinician: Referring Physician: Treating Physician/Extender: Frann Rider in Treatment: 61 Active Inactive Abuse / Safety / Falls / Self Care Management Nursing Diagnoses: Potential for falls Goals: Patient will remain injury free Date Initiated: 07/11/2014 Goal Status: Active Interventions: Assess fall risk on admission and as needed Notes: Malignancy/Atypical Etiology Nursing Diagnoses: Knowledge deficit related to disease process and management of atypical ulcer etiology Goals: Patient/caregiver will verbalize understanding of disease process and disease management of atypical ulcer etiology Date Initiated: 07/11/2014 Goal  Status: Active Interventions: Provide education on atypical ulcer etiologies Notes: Nutrition Nursing Diagnoses: Potential for alteratiion in Nutrition/Potential for imbalanced nutrition Goals: [name], [name] ([id number]) Patient/caregiver agrees to and verbalizes understanding of need to use nutritional supplements and/or vitamins as prescribed Date Initiated: 07/11/2014 Goal Status: Active Interventions: Assess patient nutrition upon admission and as needed per policy Notes: Orientation to the Wound Care Program Nursing Diagnoses: Knowledge deficit related to the wound healing center program Goals: Patient/caregiver will verbalize understanding of the [facility] Date Initiated: 07/11/2014 Goal Status: Active Interventions: Provide education on orientation to the wound center Notes: Wound/Skin Impairment Nursing Diagnoses: Impaired tissue integrity Goals: Patient will demonstrate a reduced rate of smoking or cessation of smoking Date Initiated: 07/11/2014 Goal Status: Active Patient will have a decrease in wound volume by X% from date: (specify in notes) Date Initiated: 07/11/2014 Goal Status: Active Patient/caregiver will verbalize understanding of skin care regimen Date Initiated: 07/11/2014 Goal Status: Active Ulcer/skin breakdown will have a volume reduction of 30% by week 4 Date Initiated: 07/11/2014 Goal Status: Active Ulcer/skin breakdown will have a volume reduction of 50% by week 8 Date Initiated: 07/11/2014 Goal Status: Active Ulcer/skin breakdown will have a volume reduction of 80% by week 12 [name], [name] ([id number]) Date Initiated: 07/11/2014 Goal Status: Active Ulcer/skin breakdown will heal within 14 weeks Date Initiated: 07/11/2014 Goal Status: Active Interventions: Assess patient/caregiver ability to obtain necessary supplies Assess patient/caregiver ability to perform ulcer/skin care regimen upon admission and as needed Assess  ulceration(s) every visit Provide education on smoking Provide education on ulcer and skin care Screen for HBO Notes: Electronic Signature(s) Signed: 11/25/2014 6:31:19 PM By: Gretta Cool, RN, BSN, Kim RN, BSN Entered By: Gretta Cool, RN, BSN, Kim on 11/25/2014 17:36:44 [name] [name] ([id number]) -------------------------------------------------------------------------------- Pain Assessment Details Patient Name: [name] [name] Date of Service: 11/25/2014 3:45 PM Medical Record Number: [id number] Patient Account Number: [id number] Date of Birth/Sex: [birth year] (63 y.o. Female) Treating RN: Cornell Barman Primary Care Physician: PATIENT, NO Other Clinician: Referring Physician: Treating Physician/Extender: Frann Rider in Treatment: 19 Active Problems Location of Pain Severity and Description of Pain Patient Has Paino No Site Locations Pain Management and Medication Current Pain Management: Electronic Signature(s) Signed: 11/25/2014 6:31:19 PM By: Gretta Cool, RN, BSN, Kim RN, BSN Entered By: Gretta Cool, RN, BSN, Kim on 11/25/2014 16:01:33 [name] [name] ([id number]) -------------------------------------------------------------------------------- Patient/Caregiver Education Details Patient Name: [name] [name] Date of Service: 11/25/2014 3:45 PM Medical Record Number: [id number] Patient Account Number: [id number] Date of Birth/Gender:  01-Nov-1950 (64 y.o. Female) Treating RN: Cornell Barman Primary Care Physician: PATIENT, NO Other Clinician: Referring Physician: Treating Physician/Extender: Frann Rider in Treatment: 78 Education Assessment Education Provided To: Patient Education Topics Provided Wound/Skin Impairment: Handouts: Other: wound care as prescribed Electronic Signature(s) Signed: 11/25/2014 6:31:19 PM By: Gretta Cool, RN, BSN, Kim RN, BSN Entered By: Gretta Cool, RN, BSN, Kim on 11/25/2014 17:39:34 [name] [name]  ([id number]) -------------------------------------------------------------------------------- Wound Assessment Details Patient Name: [name] [name] Date of Service: 11/25/2014 3:45 PM Medical Record Number: [id number] Patient Account Number: [id number] Date of Birth/Sex: 01-19-1951 (63 y.o. Female) Treating RN: Cornell Barman Primary Care Physician: PATIENT, NO Other Clinician: Referring Physician: Treating Physician/Extender: Frann Rider in Treatment: 19 Wound Status Wound Number: 1 Primary Malignant Wound Etiology: Wound Location: Left Chest Wound Status: Open Wounding Event: Other Lesion Comorbid Received Chemotherapy, Received Date Acquired: 05/10/2014 History: Radiation Weeks Of Treatment: 19 Clustered Wound: No Photos Photo Uploaded By: Gretta Cool, RN, BSN, Kim on 11/25/2014 18:24:23 Wound Measurements Length: (cm) 2.5 Width: (cm) 5.5 Depth: (cm) 0.1 Area: (cm) 10.799 Volume: (cm) 1.08 % Reduction in Area: 54.2% % Reduction in Volume: 77.1% Epithelialization: None Tunneling: No Undermining: No Wound Description Full Thickness Without Exposed Classification: Support Structures Wound Margin: Distinct, outline attached Exudate Small Amount: Exudate Type: Serosanguineous Exudate Color: red, brown Foul Odor After Cleansing: No Wound Bed Granulation Amount: Medium (34-66%) Exposed Structure Granulation Quality: Red Fascia Exposed: No Necrotic Amount: Medium (34-66%) Fat Layer Exposed: No [name], [name] ([id number]) Necrotic Quality: Adherent Slough Tendon Exposed: No Muscle Exposed: No Joint Exposed: No Bone Exposed: No Limited to Skin Breakdown Periwound Skin Texture Texture Color No Abnormalities Noted: No No Abnormalities Noted: Yes Callus: No Temperature / Pain Crepitus: No Temperature: No Abnormality Excoriation: No Fluctuance: No Friable: No Induration: Yes Localized Edema: No Rash: No Scarring: Yes Moisture No Abnormalities Noted:  Yes Wound Preparation Ulcer Cleansing: Rinsed/Irrigated with Saline Topical Anesthetic Applied: None Treatment Notes Wound #1 (Left Chest) 1. Cleansed with: Clean wound with Normal Saline 2. Anesthetic Topical Lidocaine 4% cream to wound bed prior to debridement 4. Dressing Applied: Aquacel Ag 5. Secondary Dressing Applied Bordered Foam Dressing Electronic Signature(s) Signed: 11/25/2014 6:31:19 PM By: Gretta Cool, RN, BSN, Kim RN, BSN Entered By: Gretta Cool, RN, BSN, Kim on 11/25/2014 16:05:11 [name], [name] ([id number]) -------------------------------------------------------------------------------- [place] Details Patient Name: [name] [name] Date of Service: 11/25/2014 3:45 PM Medical Record Number: [id number] Patient Account Number: [id number] Date of Birth/Sex: 1950/12/29 (63 y.o. Female) Treating RN: Cornell Barman Primary Care Physician: PATIENT, NO Other Clinician: Referring Physician: Treating Physician/Extender: Frann Rider in Treatment: 19 Vital Signs Time Taken: 16:00 Temperature (F): 98.7 Height (in): 68 Pulse (bpm): 89 Weight (lbs): 180.5 Respiratory Rate (breaths/min): 18 Body Mass Index (BMI): 27.4 Blood Pressure (mmHg): 133/73 Reference Range: 80 - 120 mg / dl Electronic Signature(s) Signed: 11/25/2014 6:31:19 PM By: Gretta Cool, RN, BSN, Kim RN, BSN Entered By: Gretta Cool, RN, BSN, Kim on 11/25/2014 16:02:09

## 2014-12-09 ENCOUNTER — Other Ambulatory Visit: Payer: Self-pay

## 2014-12-09 DIAGNOSIS — C50912 Malignant neoplasm of unspecified site of left female breast: Secondary | ICD-10-CM

## 2014-12-10 ENCOUNTER — Inpatient Hospital Stay: Payer: Medicare Other

## 2014-12-10 ENCOUNTER — Encounter: Payer: Self-pay | Admitting: *Deleted

## 2014-12-10 ENCOUNTER — Encounter: Payer: Self-pay | Admitting: Hematology and Oncology

## 2014-12-10 ENCOUNTER — Inpatient Hospital Stay (HOSPITAL_BASED_OUTPATIENT_CLINIC_OR_DEPARTMENT_OTHER): Payer: Medicare Other | Admitting: Hematology and Oncology

## 2014-12-10 VITALS — BP 128/82 | HR 82 | Temp 97.6°F | Resp 18

## 2014-12-10 VITALS — BP 126/80 | HR 83 | Temp 98.3°F | Resp 16 | Wt 170.7 lb

## 2014-12-10 DIAGNOSIS — R5383 Other fatigue: Secondary | ICD-10-CM | POA: Diagnosis not present

## 2014-12-10 DIAGNOSIS — C50912 Malignant neoplasm of unspecified site of left female breast: Secondary | ICD-10-CM

## 2014-12-10 DIAGNOSIS — Z171 Estrogen receptor negative status [ER-]: Secondary | ICD-10-CM

## 2014-12-10 DIAGNOSIS — G629 Polyneuropathy, unspecified: Secondary | ICD-10-CM | POA: Diagnosis not present

## 2014-12-10 DIAGNOSIS — F17210 Nicotine dependence, cigarettes, uncomplicated: Secondary | ICD-10-CM

## 2014-12-10 DIAGNOSIS — Z853 Personal history of malignant neoplasm of breast: Secondary | ICD-10-CM

## 2014-12-10 DIAGNOSIS — Z5111 Encounter for antineoplastic chemotherapy: Secondary | ICD-10-CM | POA: Diagnosis not present

## 2014-12-10 DIAGNOSIS — Z79899 Other long term (current) drug therapy: Secondary | ICD-10-CM

## 2014-12-10 LAB — CBC WITH DIFFERENTIAL/PLATELET
Basophils Absolute: 0 10*3/uL (ref 0–0.1)
Basophils Relative: 1 %
Eosinophils Absolute: 0 10*3/uL (ref 0–0.7)
Eosinophils Relative: 0 %
HCT: 38.8 % (ref 35.0–47.0)
Hemoglobin: 12.8 g/dL (ref 12.0–16.0)
Lymphocytes Relative: 34 %
Lymphs Abs: 1.4 10*3/uL (ref 1.0–3.6)
MCH: 29 pg (ref 26.0–34.0)
MCHC: 33.1 g/dL (ref 32.0–36.0)
MCV: 87.7 fL (ref 80.0–100.0)
Monocytes Absolute: 0.7 10*3/uL (ref 0.2–0.9)
Monocytes Relative: 18 %
Neutro Abs: 2 10*3/uL (ref 1.4–6.5)
Neutrophils Relative %: 47 %
Platelets: 222 10*3/uL (ref 150–440)
RBC: 4.42 MIL/uL (ref 3.80–5.20)
RDW: 19.9 % — ABNORMAL HIGH (ref 11.5–14.5)
WBC: 4.1 10*3/uL (ref 3.6–11.0)

## 2014-12-10 LAB — COMPREHENSIVE METABOLIC PANEL
ALT: 26 U/L (ref 14–54)
AST: 27 U/L (ref 15–41)
Albumin: 4 g/dL (ref 3.5–5.0)
Alkaline Phosphatase: 83 U/L (ref 38–126)
Anion gap: 9 (ref 5–15)
BUN: 20 mg/dL (ref 6–20)
CO2: 23 mmol/L (ref 22–32)
Calcium: 8.5 mg/dL — ABNORMAL LOW (ref 8.9–10.3)
Chloride: 106 mmol/L (ref 101–111)
Creatinine, Ser: 0.87 mg/dL (ref 0.44–1.00)
GFR calc Af Amer: >60 mL/min (ref >60)
GFR calc non Af Amer: >60 mL/min (ref >60)
Glucose, Bld: 88 mg/dL (ref 65–99)
Potassium: 3.7 mmol/L (ref 3.5–5.1)
Sodium: 138 mmol/L (ref 135–145)
Total Bilirubin: 0.5 mg/dL (ref 0.3–1.2)
Total Protein: 7.6 g/dL (ref 6.5–8.1)

## 2014-12-10 MED ORDER — INV-ERIBULIN CHEMO INJECTION 1 MG/2ML RU011201I ACCRU
1.1000 mg/m2 | Freq: Once | INTRAVENOUS | Status: AC
  Administered 2014-12-10: 2.15 mg via INTRAVENOUS
  Filled 2014-12-10: qty 4.3

## 2014-12-10 MED ORDER — SODIUM CHLORIDE 0.9 % IV SOLN
Freq: Once | INTRAVENOUS | Status: AC
  Administered 2014-12-10: 16:00:00 via INTRAVENOUS
  Filled 2014-12-10: qty 4

## 2014-12-10 MED ORDER — SODIUM CHLORIDE 0.9 % IJ SOLN
10.0000 mL | INTRAMUSCULAR | Status: DC | PRN
  Filled 2014-12-10: qty 10

## 2014-12-10 MED ORDER — SODIUM CHLORIDE 0.9 % IV SOLN
Freq: Once | INTRAVENOUS | Status: AC
  Administered 2014-12-10: 16:00:00 via INTRAVENOUS
  Filled 2014-12-10: qty 1000

## 2014-12-10 MED ORDER — HEPARIN SOD (PORK) LOCK FLUSH 100 UNIT/ML IV SOLN
500.0000 [IU] | Freq: Once | INTRAVENOUS | Status: AC
  Administered 2014-12-10: 500 [IU] via INTRAVENOUS

## 2014-12-10 NOTE — Progress Notes (Signed)
[facility] [id number] Protocol- Patient arrives in clinic today alone for Cycle 7/Day 1 Eribulin infusion. 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 She does report that she feels unsteady on her feet and stumbles occasionally.  She denies pain at this time. When she does experience pain it is mild and in the left breast lesion. She reports her appetite was improved at her last visit and continues to be good.  Her weight is stable at 170 lbs. Pictures were taken of her healed lesions and the left breast lesion. She was seen by [name] and cleared for today's treatment. She continues at a level one dose reduction of 1.1 mg/m@/total dose 2.15mg , which was reduced due to peripheral neuropathy. She is scheduled to go on a cruise on Sept 17, 2016 and will miss a treatment.   Decreased appetite-grade 1; definitely attributed Fatigue -grade 1; definitely attributed Alopecia - grade 1 definitely attributed Insomnia - resolved Peripheral Neuropathy grade 2; definitely attributed  Weight loss - grade 1 ( 7%) total weight loss) definitely attributed Constipation-resolved

## 2014-12-10 NOTE — Progress Notes (Signed)
[facility] day:  12/10/2014  Chief Complaint: [name] [name] is a 64 y.o. female with recurrent breast cancer who is seen for assessment prior to day 1 of cycle #7 Halaven.  HPI: The patient was last seen in the medical oncology clinic on 11/19/2014.  At that time, chest wall exam continued to show improvement.  She was still fatigued and losing weight.  In addition, she was noted to have a new grade II neuropathy.  As she was not tolerating her neuropathy, decision was made to dose reduce her Halaven to 1.1 mg/m2.  She received her chemotherapy uneventfully.  During the interim, she has felt fine. She denies any pain.  She has not needed any pain medication for the past 2 weeks. Previously she was taking 1 pill a day.  She denies any weight loss. Fatigue is about the same.  She denies any insomnia. She had 1 episode of diarrhea since her last visit. 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 She remains unsteady on her feet.  Neuropathy in her hands and feet has not worsened. Sometimes when she "rounds the corner", she may bump into it.  Her feet stay cold but this is "no better or worse".  Regarding her chest wall disease, she continues to note improvement.  Past Medical History  Diagnosis Date  . Personal history of malignant neoplasm of breast   . Anemia   . Blood transfusion without reported diagnosis   . Emphysema of lung   . Breast cancer     Past Surgical History  Procedure Laterality Date  . Oophrectomy  1980  . Abdominal hysterectomy  1995  . Breast surgery Right 1996    mastectomy  . Breast biopsy Left 2014  . Breast biopsy Left 01-21-14    Family History  Problem Relation Age of Onset  . Cancer Father   . Heart disease Sister   . Diabetes Sister   . Heart disease Brother   . Cancer Paternal Grandmother     breast    Social History:  reports that she has been smoking Cigarettes.  She has a 25 pack-year smoking history.  She has never used smokeless tobacco. She reports that she does not drink alcohol or use illicit drugs.  She is going on a cruise from 12/26/2014 - 01/05/2015.  The patient is accompanied by Jake Samples, protocol nurse.  Allergies:  Allergies  Allergen Reactions  . Sulfa Antibiotics Nausea And Vomiting    headache    Current Medications: Current Outpatient Prescriptions  Medication Sig Dispense Refill  . Calcium Carbonate-Vitamin D (CALCIUM + D PO) Take 2 tablets by mouth daily.    Marland Kitchen KLOR-CON 10 10 MEQ tablet Take 1 tablet (10 mEq total) by mouth daily. 30 tablet 1  . LORazepam (ATIVAN) 0.5 MG tablet   0  . traMADol (ULTRAM) 50 MG tablet Take 1 tablet (50 mg total) by mouth every 8 (eight) hours as needed. 90 tablet 0   No current facility-administered medications for this visit.   Facility-Administered Medications Ordered in Other Visits  Medication Dose Route Frequency Provider Last Rate Last Dose  . sodium chloride 0.9 % injection 10 mL  10 mL Intracatheter PRN Lequita Asal, MD      . sodium chloride 0.9 % injection 10 mL  10 mL Intracatheter PRN Lequita Asal, MD   10 mL at 08/27/14 1400  . sodium chloride 0.9 % injection 10 mL  10 mL Intravenous  PRN Lequita Asal, MD   10 mL at 10/08/14 1335  . sodium chloride 0.9 % injection 10 mL  10 mL Intracatheter PRN Lequita Asal, MD      . sodium chloride 0.9 % injection 10 mL  10 mL Intracatheter PRN Lequita Asal, MD   10 mL at 11/26/14 1355    Review of Systems:  GENERAL:  Feels fine.  No fevers or sweats.  Weight stable. PERFORMANCE STATUS (ECOG):  1 HEENT:  No visual changes, runny nose, sore throat, mouth sores or tenderness. Lungs: No shortness of breath or cough.  No hemoptysis. Cardiac:  No chest pain, palpitations, orthopnea, or PND. GI:  Appetite better with less Ensure.  Constipation improved.  No nausea, vomiting, diarrhea, melena or hematochezia. GU:  No urgency, frequency, dysuria, or  hematuria. Musculoskeletal:  No back pain.  No joint pain.  No muscle tenderness. Extremities:  No pain or swelling. Skin:  Hair thinning.  No rashes or skin changes. Neuro:  Little unsteady on feet secondary to sensation in feet.  Toes feel cool, but are warm to the touch.  No headache, numbness or weakness, or coordination issues. Endocrine:  No diabetes, thyroid issues, hot flashes or night sweats. Psych:  Insomnia, resolved.  No mood changes, depression or anxiety. Pain:  Decreased pain.  No need for pain medications in 2 weeks. Review of systems:  All other systems reviewed and found to be negative.   Physical Exam: Blood pressure 126/80, pulse 83, temperature 98.3 F (36.8 C), temperature source Tympanic, resp. rate 16, weight 170 lb 11.9 oz (77.45 kg).  GENERAL: Well developed, well nourished, sitting comfortably in the exam room in no acute distress. MENTAL STATUS: Alert and oriented to person, place and time. HEAD: Alopecia. Normocephalic, atraumatic, face symmetric, no Cushingoid features. EYES: Brown eyes. Pupils equal round and reactive to light and accomodation. No conjunctivitis or scleral icterus. ENT: Oropharynx clear without lesion. Tongue normal. Mucous membranes moist.  RESPIRATORY: Clear to auscultation without rales, wheezes or rhonchi. CARDIOVASCULAR: Regular rate and rhythm without murmur, rub or gallop. BREAST: Right chest wall lesions healed. Central upper sternal area flat.  Left lateral denuded area triangular in shape, smaller. ABDOMEN: Soft, non-tender, with active bowel sounds, and no hepatosplenomegaly. No masses. SKIN: Cutaneous breast cancer noted in breast section. No other lesions.  Long nails. EXTREMITIES: No edema, no skin discoloration or tenderness. No palpable cords. LYMPH NODES: Left axillary ridge slightly less prominent. No palpable cervical, supraclavicular, axillary or inguinal adenopathy  NEUROLOGICAL: Unremarkable. PSYCH:  Appropriate.  Infusion on 12/10/2014  Component Date Value Ref Range Status  . WBC 12/10/2014 4.1  3.6 - 11.0 K/uL Final   A-LINE DRAW  . RBC 12/10/2014 4.42  3.80 - 5.20 MIL/uL Final  . Hemoglobin 12/10/2014 12.8  12.0 - 16.0 g/dL Final  . HCT 12/10/2014 38.8  35.0 - 47.0 % Final  . MCV 12/10/2014 87.7  80.0 - 100.0 fL Final  . MCH 12/10/2014 29.0  26.0 - 34.0 pg Final  . MCHC 12/10/2014 33.1  32.0 - 36.0 g/dL Final  . RDW 12/10/2014 19.9* 11.5 - 14.5 % Final  . Platelets 12/10/2014 222  150 - 440 K/uL Final  . Neutrophils Relative % 12/10/2014 47   Final  . Neutro Abs 12/10/2014 2.0  1.4 - 6.5 K/uL Final  . Lymphocytes Relative 12/10/2014 34   Final  . Lymphs Abs 12/10/2014 1.4  1.0 - 3.6 K/uL Final  . Monocytes Relative 12/10/2014 18  Final  . Monocytes Absolute 12/10/2014 0.7  0.2 - 0.9 K/uL Final  . Eosinophils Relative 12/10/2014 0   Final  . Eosinophils Absolute 12/10/2014 0.0  0 - 0.7 K/uL Final  . Basophils Relative 12/10/2014 1   Final  . Basophils Absolute 12/10/2014 0.0  0 - 0.1 K/uL Final  . Sodium 12/10/2014 138  135 - 145 mmol/L Final  . Potassium 12/10/2014 3.7  3.5 - 5.1 mmol/L Final  . Chloride 12/10/2014 106  101 - 111 mmol/L Final  . CO2 12/10/2014 23  22 - 32 mmol/L Final  . Glucose, Bld 12/10/2014 88  65 - 99 mg/dL Final  . BUN 12/10/2014 20  6 - 20 mg/dL Final  . Creatinine, Ser 12/10/2014 0.87  0.44 - 1.00 mg/dL Final  . Calcium 12/10/2014 8.5* 8.9 - 10.3 mg/dL Final  . Total Protein 12/10/2014 7.6  6.5 - 8.1 g/dL Final  . Albumin 12/10/2014 4.0  3.5 - 5.0 g/dL Final  . AST 12/10/2014 27  15 - 41 U/L Final  . ALT 12/10/2014 26  14 - 54 U/L Final  . Alkaline Phosphatase 12/10/2014 83  38 - 126 U/L Final  . Total Bilirubin 12/10/2014 0.5  0.3 - 1.2 mg/dL Final  . GFR calc non Af Amer 12/10/2014 >60  >60 mL/min Final  . GFR calc Af Amer 12/10/2014 >60  >60 mL/min Final   Comment: (NOTE) The eGFR has been calculated using the CKD EPI equation. This  calculation has not been validated in all clinical situations. eGFR's persistently <60 mL/min signify possible Chronic Kidney Disease.   . Anion gap 12/10/2014 9  5 - 15 Final  . CA 27.29 12/10/2014 37.6  0.0 - 38.6 U/mL Final   Comment: (NOTE) Bayer Centaur/ACS methodology Performed At: [facility] [address] [place], [place] [id number] Lindon Romp MD QJ:[id number]     Assessment:  EMMALYNE GIACOMO is a 64 y.o. African American woman with a history of stage I right breast cancer s/p modified radical mastectomy on 06/12/1996. Pathology revealed a grade III mutifocal infiltrating ductal carcinoma (tumor size 8.5 cm with an invasive component 1.6 cm). Twenty-one lymph nodes were negative. Tumor was ER/PR negative. She received 4 cycles of AC at [facility].  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 PET scan on 05/22/2012 revealed multiple areas of disease. She received 6 cycles of Taxotere and Cytoxan (TC) from 06/06/2012 - 09/29/2012. PET scan on 09/07/2012 noted marked improvement. Post chemotherapy biopsy was positive. She was not a surgical candidate. She was placed on Femara.  PET scan on 05/03/2013 revealed a new focus along lateral left breast and a new level II cervical lymph node. She received radiation from 09/13/2013 - 11/05/2013. PET scan on 01/01/2014 revealed new nodule medial left chest wall pleural/paraspinal activity LUL. Multiple biopsies on 01/19/2014 were positive. She began daily letrozole and Ibrance on 02/12/2014. Despite treatment, new nodules formed. She received a total of 2 cycles.   PET scan on 05/09/2014 revealed marked progression of disease with multifocal masses in chest wall (left > right). She did not receive Imbrance in 04/2014. CA27.29 has increased: 22.9 on 01/08/2014, 54 on 03/05/2014, 170.8 on 05/06/2014, 250.8 on  05/23/2014, 445.8 on 06/14/2014, and 531.1 on 07/01/2104.   She received 3 cycles of Xeloda (02/12 - 07/05/2014). Chest and abdomen CT scan on 07/05/2014 revealed persistent and slightly progressive diffuse locally invasive chest wall tumor.  Bone scan on 07/25/2014 revealed no evidence of metastatic disease.   She is status post 6 cycles of Halaven (08/06/2014 - 11/19/2014) on ACCRU [id number] I. Cycle #1 was notable for an ANC of 200 on day 15. Cycle #2 was notable for grade II fatigue, brief grade I diarrhea, and decreased appetite.  Cycle #3 was notable for grade I fatigue, anorexia, and weight loss.  Cycle #4 and #5 were notable for fatigue/lack of stamina, ongoing weight loss, and transient cold neuropathy in her feet.  Cycle #6 was notable for the development of a grade II neuropathy.  Chest, abdomen, and pelvic CT scan on 10/24/2014 revealed improved multifocal inflammatory left breast cancer.  The dominant 2.6 x 3.5 cm lesion in the left upper outer left breast has decreased to 1.7 x 3.5 cm. The anterior sternal lesion has decreased from 2 x 4 cm to 1.5 x 3.9 cm. There were no suspicious mediastinal, hilar or axillary adenopathy.  CA27.29 has decreased: 339.8 on 08/06/2014, 51.7 on 10/15/2014, and 37.6 on 12/10/2014.   Symptomatically, her grade II neuropathy is stable  Weight is stable.  She has not required pain medications in 2 weeks.  Exam reveals ongoing improvement in chest wall disease.  Plan: 1. Labs today: CBC with diff, CMP, CA27.29. 2. Day 1 of cycle #7 Halaven.  Maintain dose level -1 reduction (1.1 mg/m2) secondary to neuropathy. 3. Medical photographs. 4. RTC in 1 week for labs and day 8 of cycle #7 Halaven. 5. RTC on 01/07/2015 for MD assessment, labs (CBC with diff, CMP, CA27.29), and cycle #8 Halaven.   Lequita Asal, MD  12/10/2014, 1:27 PM

## 2014-12-11 LAB — CANCER ANTIGEN 27.29: CA 27.29: 37.6 U/mL (ref 0.0–38.6)

## 2014-12-17 ENCOUNTER — Encounter: Payer: Self-pay | Admitting: *Deleted

## 2014-12-17 ENCOUNTER — Encounter: Payer: Self-pay | Admitting: Hematology and Oncology

## 2014-12-17 ENCOUNTER — Inpatient Hospital Stay: Payer: Medicare Other | Attending: Hematology and Oncology

## 2014-12-17 ENCOUNTER — Inpatient Hospital Stay: Payer: Medicare Other

## 2014-12-17 VITALS — BP 116/74 | HR 80 | Temp 96.0°F | Resp 18

## 2014-12-17 DIAGNOSIS — Z171 Estrogen receptor negative status [ER-]: Secondary | ICD-10-CM | POA: Insufficient documentation

## 2014-12-17 DIAGNOSIS — G629 Polyneuropathy, unspecified: Secondary | ICD-10-CM | POA: Diagnosis not present

## 2014-12-17 DIAGNOSIS — C50912 Malignant neoplasm of unspecified site of left female breast: Secondary | ICD-10-CM | POA: Insufficient documentation

## 2014-12-17 DIAGNOSIS — Z23 Encounter for immunization: Secondary | ICD-10-CM | POA: Insufficient documentation

## 2014-12-17 DIAGNOSIS — R5383 Other fatigue: Secondary | ICD-10-CM | POA: Insufficient documentation

## 2014-12-17 DIAGNOSIS — Z853 Personal history of malignant neoplasm of breast: Secondary | ICD-10-CM | POA: Diagnosis not present

## 2014-12-17 DIAGNOSIS — F17210 Nicotine dependence, cigarettes, uncomplicated: Secondary | ICD-10-CM | POA: Diagnosis not present

## 2014-12-17 DIAGNOSIS — Z79899 Other long term (current) drug therapy: Secondary | ICD-10-CM | POA: Insufficient documentation

## 2014-12-17 LAB — CBC WITH DIFFERENTIAL/PLATELET
Basophils Absolute: 0 10*3/uL (ref 0–0.1)
Basophils Relative: 0 %
Eosinophils Absolute: 0 10*3/uL (ref 0–0.7)
Eosinophils Relative: 0 %
HCT: 37.3 % (ref 35.0–47.0)
Hemoglobin: 12.4 g/dL (ref 12.0–16.0)
Lymphocytes Relative: 42 %
Lymphs Abs: 1.4 10*3/uL (ref 1.0–3.6)
MCH: 28.6 pg (ref 26.0–34.0)
MCHC: 33.2 g/dL (ref 32.0–36.0)
MCV: 86.2 fL (ref 80.0–100.0)
Monocytes Absolute: 0.4 10*3/uL (ref 0.2–0.9)
Monocytes Relative: 10 %
Neutro Abs: 1.6 10*3/uL (ref 1.4–6.5)
Neutrophils Relative %: 48 %
Platelets: 183 10*3/uL (ref 150–440)
RBC: 4.32 MIL/uL (ref 3.80–5.20)
RDW: 19.2 % — ABNORMAL HIGH (ref 11.5–14.5)
WBC: 3.5 10*3/uL — ABNORMAL LOW (ref 3.6–11.0)

## 2014-12-17 MED ORDER — SODIUM CHLORIDE 0.9 % IJ SOLN
10.0000 mL | INTRAMUSCULAR | Status: DC | PRN
  Administered 2014-12-17: 10 mL
  Filled 2014-12-17: qty 10

## 2014-12-17 MED ORDER — SODIUM CHLORIDE 0.9 % IV SOLN
Freq: Once | INTRAVENOUS | Status: AC
  Administered 2014-12-17: 15:00:00 via INTRAVENOUS
  Filled 2014-12-17: qty 4

## 2014-12-17 MED ORDER — INV-ERIBULIN CHEMO INJECTION 1 MG/2ML RU011201I ACCRU
1.1000 mg/m2 | Freq: Once | INTRAVENOUS | Status: AC
  Administered 2014-12-17: 2.15 mg via INTRAVENOUS
  Filled 2014-12-17: qty 4.3

## 2014-12-17 MED ORDER — SODIUM CHLORIDE 0.9 % IV SOLN
Freq: Once | INTRAVENOUS | Status: AC
  Administered 2014-12-17: 15:00:00 via INTRAVENOUS
  Filled 2014-12-17: qty 1000

## 2014-12-17 MED ORDER — TRAMADOL HCL 50 MG PO TABS: 50.0000 mg | ORAL_TABLET | Freq: Three times a day (TID) | ORAL | Status: DC | PRN

## 2014-12-17 MED ORDER — HEPARIN SOD (PORK) LOCK FLUSH 100 UNIT/ML IV SOLN
500.0000 [IU] | Freq: Once | INTRAVENOUS | Status: AC | PRN
  Administered 2014-12-17: 500 [IU]
  Filled 2014-12-17: qty 5

## 2014-12-17 NOTE — Progress Notes (Unsigned)
12/17/14 @14 :30pm [name] [name] returns to clinic this afternoon for consideration of cycle 7 Day 8 Eribulin infusion per protocol [id number] I. Reports she is doing about the same. States her neuropathy in her hands and feet have not worsened, but have not improved much either. States her appetite remains poor, but she attempts to eat regularly. CBC is within acceptable parameters for patient to receive Eribulin infusion. VS are stable. Patient reports she will not return to clinic until 01/07/15 due to leaving on a cruise next week. States she will not return until the following weekend and will not be able to receive her treatment during the week it is scheduled. Will move Cycle 8 infusion until 01/07/15 at patient's request. [name] is aware of this as well. All of above was reported to [name] and she has signed treatment orders. Reviewed PRO phone survey with patient prior to infusion. Continuing AEs with grade and attributions are as follows:    Decreased appetite-grade 1; definitely attributed   Fatigue -grade 1; definitely attributed   Alopecia - grade 1 definitely attributed   Insomnia - grade 1; unlikely related   Peripheral Neuropathy grade 2; definitely attributed    Weight loss - grade 1 ( 7%) total weight loss) definitely attributed   Constipation-resolved

## 2014-12-23 ENCOUNTER — Ambulatory Visit: Payer: Medicare Other | Admitting: Surgery

## 2015-01-06 ENCOUNTER — Other Ambulatory Visit: Payer: Self-pay

## 2015-01-06 DIAGNOSIS — C50912 Malignant neoplasm of unspecified site of left female breast: Secondary | ICD-10-CM

## 2015-01-07 ENCOUNTER — Inpatient Hospital Stay: Payer: Medicare Other

## 2015-01-07 ENCOUNTER — Encounter: Payer: Self-pay | Admitting: *Deleted

## 2015-01-07 ENCOUNTER — Encounter: Payer: Self-pay | Admitting: Hematology and Oncology

## 2015-01-07 ENCOUNTER — Inpatient Hospital Stay (HOSPITAL_BASED_OUTPATIENT_CLINIC_OR_DEPARTMENT_OTHER): Payer: Medicare Other | Admitting: Hematology and Oncology

## 2015-01-07 VITALS — BP 127/87 | HR 84 | Temp 96.2°F | Wt 172.6 lb

## 2015-01-07 DIAGNOSIS — G629 Polyneuropathy, unspecified: Secondary | ICD-10-CM | POA: Diagnosis not present

## 2015-01-07 DIAGNOSIS — C50912 Malignant neoplasm of unspecified site of left female breast: Secondary | ICD-10-CM

## 2015-01-07 DIAGNOSIS — C50919 Malignant neoplasm of unspecified site of unspecified female breast: Secondary | ICD-10-CM

## 2015-01-07 DIAGNOSIS — Z853 Personal history of malignant neoplasm of breast: Secondary | ICD-10-CM

## 2015-01-07 DIAGNOSIS — Z171 Estrogen receptor negative status [ER-]: Secondary | ICD-10-CM | POA: Diagnosis not present

## 2015-01-07 DIAGNOSIS — R5383 Other fatigue: Secondary | ICD-10-CM

## 2015-01-07 DIAGNOSIS — Z79899 Other long term (current) drug therapy: Secondary | ICD-10-CM

## 2015-01-07 DIAGNOSIS — F17210 Nicotine dependence, cigarettes, uncomplicated: Secondary | ICD-10-CM

## 2015-01-07 LAB — CBC WITH DIFFERENTIAL/PLATELET
Basophils Absolute: 0 10*3/uL (ref 0–0.1)
Basophils Relative: 1 %
Eosinophils Absolute: 0 10*3/uL (ref 0–0.7)
Eosinophils Relative: 0 %
HCT: 38.5 % (ref 35.0–47.0)
Hemoglobin: 12.8 g/dL (ref 12.0–16.0)
Lymphocytes Relative: 28 %
Lymphs Abs: 1.3 10*3/uL (ref 1.0–3.6)
MCH: 28.8 pg (ref 26.0–34.0)
MCHC: 33.2 g/dL (ref 32.0–36.0)
MCV: 86.5 fL (ref 80.0–100.0)
Monocytes Absolute: 0.6 10*3/uL (ref 0.2–0.9)
Monocytes Relative: 13 %
Neutro Abs: 2.7 10*3/uL (ref 1.4–6.5)
Neutrophils Relative %: 58 %
Platelets: 189 10*3/uL (ref 150–440)
RBC: 4.45 MIL/uL (ref 3.80–5.20)
RDW: 19.8 % — ABNORMAL HIGH (ref 11.5–14.5)
WBC: 4.7 10*3/uL (ref 3.6–11.0)

## 2015-01-07 LAB — COMPREHENSIVE METABOLIC PANEL
ALT: 27 U/L (ref 14–54)
AST: 29 U/L (ref 15–41)
Albumin: 3.9 g/dL (ref 3.5–5.0)
Alkaline Phosphatase: 92 U/L (ref 38–126)
Anion gap: 7 (ref 5–15)
BUN: 17 mg/dL (ref 6–20)
CO2: 25 mmol/L (ref 22–32)
Calcium: 8.5 mg/dL — ABNORMAL LOW (ref 8.9–10.3)
Chloride: 106 mmol/L (ref 101–111)
Creatinine, Ser: 0.86 mg/dL (ref 0.44–1.00)
GFR calc Af Amer: >60 mL/min (ref >60)
GFR calc non Af Amer: >60 mL/min (ref >60)
Glucose, Bld: 90 mg/dL (ref 65–99)
Potassium: 3.9 mmol/L (ref 3.5–5.1)
Sodium: 138 mmol/L (ref 135–145)
Total Bilirubin: 0.3 mg/dL (ref 0.3–1.2)
Total Protein: 7.6 g/dL (ref 6.5–8.1)

## 2015-01-07 MED ORDER — SODIUM CHLORIDE 0.9 % IV SOLN
Freq: Once | INTRAVENOUS | Status: AC
  Administered 2015-01-07: 16:00:00 via INTRAVENOUS
  Filled 2015-01-07: qty 4

## 2015-01-07 MED ORDER — INV-ERIBULIN CHEMO INJECTION 1 MG/2ML RU011201I ACCRU
1.1000 mg/m2 | Freq: Once | INTRAVENOUS | Status: AC
  Administered 2015-01-07: 2.15 mg via INTRAVENOUS
  Filled 2015-01-07: qty 4.3

## 2015-01-07 MED ORDER — SODIUM CHLORIDE 0.9 % IV SOLN
Freq: Once | INTRAVENOUS | Status: AC
  Administered 2015-01-07: 16:00:00 via INTRAVENOUS
  Filled 2015-01-07: qty 1000

## 2015-01-07 MED ORDER — HEPARIN SOD (PORK) LOCK FLUSH 100 UNIT/ML IV SOLN
500.0000 [IU] | Freq: Once | INTRAVENOUS | Status: AC | PRN
  Administered 2015-01-07: 500 [IU]
  Filled 2015-01-07 (×2): qty 5

## 2015-01-07 MED ORDER — INFLUENZA VAC SPLIT QUAD 0.5 ML IM SUSY
0.5000 mL | PREFILLED_SYRINGE | Freq: Once | INTRAMUSCULAR | Status: AC
  Administered 2015-01-07: 0.5 mL via INTRAMUSCULAR
  Filled 2015-01-07: qty 0.5

## 2015-01-07 NOTE — Progress Notes (Signed)
[facility] day:  01/07/2015   Chief Complaint: [name] [name] is a 64 y.o. female with recurrent breast cancer who is seen for assessment prior to day 1 of cycle #8 Halaven.  HPI: The patient was last seen in the medical oncology clinic on 12/10/2014.  At that time, her grade II neuropathy was stable.  Weight was stable.  She had not required pain medications medications in 2 weeks.  Exam revealed ongoing improvement in chest wall disease.  CA27.29 was 26.4.  She received her chemotherapy uneventfully.  She returned for day 8 chemotherapy on 01/14/2015  During the interim, she went on a cruise.  She felt good.  Weight increased 2 pounds.  She did not need any pain pills.  She has some mild fatigue.  She has had some constipation.  Insomnia is mild.  She still has some numbness in her feet.  Fingers are better.  Past Medical History  Diagnosis Date  . Personal history of malignant neoplasm of breast   . Anemia   . Blood transfusion without reported diagnosis   . Emphysema of lung   . Breast cancer     Past Surgical History  Procedure Laterality Date  . Oophrectomy  1980  . Abdominal hysterectomy  1995  . Breast surgery Right 1996    mastectomy  . Breast biopsy Left 2014  . Breast biopsy Left 01-21-14    Family History  Problem Relation Age of Onset  . Cancer Father   . Heart disease Sister   . Diabetes Sister   . Heart disease Brother   . Cancer Paternal Grandmother     breast    Social History:  reports that she has been smoking Cigarettes.  She has a 25 pack-year smoking history. She has never used smokeless tobacco. She reports that she does not drink alcohol or use illicit drugs.  The patient is accompanied by Karma Ganja, protocol nurse.  Allergies:  Allergies  Allergen Reactions  . Sulfa Antibiotics Nausea And Vomiting    headache    Current Medications: Current Outpatient Prescriptions  Medication Sig Dispense  Refill  . Calcium Carbonate-Vitamin D (CALCIUM + D PO) Take 2 tablets by mouth daily.    Marland Kitchen KLOR-CON 10 10 MEQ tablet Take 1 tablet (10 mEq total) by mouth daily. 30 tablet 1  . LORazepam (ATIVAN) 0.5 MG tablet   0  . traMADol (ULTRAM) 50 MG tablet Take 1 tablet (50 mg total) by mouth every 8 (eight) hours as needed. 90 tablet 0   Current Facility-Administered Medications  Medication Dose Route Frequency Provider Last Rate Last Dose  . Influenza vac split quadrivalent PF (FLUARIX) injection 0.5 mL  0.5 mL Intramuscular Once Lequita Asal, MD       Facility-Administered Medications Ordered in Other Visits  Medication Dose Route Frequency Provider Last Rate Last Dose  . sodium chloride 0.9 % injection 10 mL  10 mL Intracatheter PRN Lequita Asal, MD      . sodium chloride 0.9 % injection 10 mL  10 mL Intracatheter PRN Lequita Asal, MD   10 mL at 08/27/14 1400  . sodium chloride 0.9 % injection 10 mL  10 mL Intravenous PRN Lequita Asal, MD   10 mL at 10/08/14 1335  . sodium chloride 0.9 % injection 10 mL  10 mL Intracatheter PRN Lequita Asal, MD      . sodium chloride 0.9 % injection 10 mL  10 mL Intracatheter PRN Lequita Asal, MD   10 mL at 11/26/14 1355    Review of Systems:  GENERAL:  Little fatigue.  No fevers or sweats.  Weight up 2 pounds. PERFORMANCE STATUS (ECOG):  1 HEENT:  No visual changes, runny nose, sore throat, mouth sores or tenderness. Lungs: No shortness of breath or cough.  No hemoptysis. Cardiac:  No chest pain, palpitations, orthopnea, or PND. GI:  Mild constipation.  No nausea, vomiting, diarrhea, melena or hematochezia. GU:  No urgency, frequency, dysuria, or hematuria. Musculoskeletal:  No back pain.  No joint pain.  No muscle tenderness. Extremities:  No pain or swelling. Skin:  Hair thinning.  No rashes or skin changes. Neuro:  Mild numbness in feet.  Toes feel cool, but are warm to the touch.  No headache, numbness or weakness, or  coordination issues. Endocrine:  No diabetes, thyroid issues, hot flashes or night sweats. Psych:  Insomnia, mild.  No mood changes, depression or anxiety. Pain:  No need for pain medications. Review of systems:  All other systems reviewed and found to be negative.   Physical Exam: Blood pressure 127/87, pulse 84, temperature 96.2 F (35.7 C), temperature source Tympanic, weight 172 lb 9.9 oz (78.3 kg).  GENERAL: Well developed, well nourished, sitting comfortably in the exam room in no acute distress. MENTAL STATUS: Alert and oriented to person, place and time. HEAD: Alopecia. Normocephalic, atraumatic, face symmetric, no Cushingoid features. EYES: Brown eyes. Pupils equal round and reactive to light and accomodation. No conjunctivitis or scleral icterus. ENT: Oropharynx clear without lesion. Tongue normal. Mucous membranes moist.  RESPIRATORY: Clear to auscultation without rales, wheezes or rhonchi. CARDIOVASCULAR: Regular rate and rhythm without murmur, rub or gallop. BREAST: Right chest wall lesions healed. Central upper sternal remains area flat.  Left lateral denuded area triangular in shape (3 x 1-1.5 cm). ABDOMEN: Soft, non-tender, with active bowel sounds, and no hepatosplenomegaly. No masses. SKIN: Cutaneous breast cancer noted in breast section. No other lesions. EXTREMITIES: No edema, no skin discoloration or tenderness. No palpable cords. LYMPH NODES: Left axillary ridge. No palpable cervical, supraclavicular, axillary or inguinal adenopathy  NEUROLOGICAL: Unremarkable. PSYCH: Appropriate.  Infusion on 01/07/2015  Component Date Value Ref Range Status  . WBC 01/07/2015 4.7  3.6 - 11.0 K/uL Final   A-LINE DRAW  . RBC 01/07/2015 4.45  3.80 - 5.20 MIL/uL Final  . Hemoglobin 01/07/2015 12.8  12.0 - 16.0 g/dL Final  . HCT 01/07/2015 38.5  35.0 - 47.0 % Final  . MCV 01/07/2015 86.5  80.0 - 100.0 fL Final  . MCH 01/07/2015 28.8  26.0 - 34.0 pg Final  . MCHC  01/07/2015 33.2  32.0 - 36.0 g/dL Final  . RDW 01/07/2015 19.8* 11.5 - 14.5 % Final  . Platelets 01/07/2015 189  150 - 440 K/uL Final  . Neutrophils Relative % 01/07/2015 58   Final  . Neutro Abs 01/07/2015 2.7  1.4 - 6.5 K/uL Final  . Lymphocytes Relative 01/07/2015 28   Final  . Lymphs Abs 01/07/2015 1.3  1.0 - 3.6 K/uL Final  . Monocytes Relative 01/07/2015 13   Final  . Monocytes Absolute 01/07/2015 0.6  0.2 - 0.9 K/uL Final  . Eosinophils Relative 01/07/2015 0   Final  . Eosinophils Absolute 01/07/2015 0.0  0 - 0.7 K/uL Final  . Basophils Relative 01/07/2015 1   Final  . Basophils Absolute 01/07/2015 0.0  0 - 0.1 K/uL Final    Assessment:  [name] [name]  is a 64 y.o. African American woman with a history of stage I right breast cancer s/p modified radical mastectomy on 06/12/1996. Pathology revealed a grade III mutifocal infiltrating ductal carcinoma (tumor size 8.5 cm with an invasive component 1.6 cm). Twenty-one lymph nodes were negative. Tumor was ER/PR negative. She received 4 cycles of AC at [facility].  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 PET scan on 05/22/2012 revealed multiple areas of disease. She received 6 cycles of Taxotere and Cytoxan (TC) from 06/06/2012 - 09/29/2012. PET scan on 09/07/2012 noted marked improvement. Post chemotherapy biopsy was positive. She was not a surgical candidate. She was placed on Femara.  PET scan on 05/03/2013 revealed a new focus along lateral left breast and a new level II cervical lymph node. She received radiation from 09/13/2013 - 11/05/2013. PET scan on 01/01/2014 revealed new nodule medial left chest wall pleural/paraspinal activity LUL. Multiple biopsies on 01/19/2014 were positive. She began daily letrozole and Ibrance on 02/12/2014. Despite treatment, new nodules formed. She received a total of 2 cycles.   PET  scan on 05/09/2014 revealed marked progression of disease with multifocal masses in chest wall (left > right). She did not receive Imbrance in 04/2014. CA27.29 has increased: 22.9 on 01/08/2014, 54 on 03/05/2014, 170.8 on 05/06/2014, 250.8 on 05/23/2014, 445.8 on 06/14/2014, and 531.1 on 07/01/2104.   She received 3 cycles of Xeloda (02/12 - 07/05/2014). Chest and abdomen CT scan on 07/05/2014 revealed persistent and slightly progressive diffuse locally invasive chest wall tumor. Bone scan on 07/25/2014 revealed no evidence of metastatic disease.   She is status post 7 cycles of Halaven (08/06/2014 - 12/10/2014) on ACCRU [id number] I. Cycle #1 was notable for an ANC of 200 on day 15. Cycle #2 was notable for grade II fatigue, brief grade I diarrhea, and decreased appetite.  Cycle #3 was notable for grade I fatigue, anorexia, and weight loss.  Cycle #4 and #5 were notable for fatigue/lack of stamina, ongoing weight loss, and transient cold neuropathy in her feet.  Cycle #6 was notable for the development of a grade II neuropathy.  Chest, abdomen, and pelvic CT scan on 10/24/2014 revealed improved multifocal inflammatory left breast cancer.  The dominant 2.6 x 3.5 cm lesion in the left upper outer left breast has decreased to 1.7 x 3.5 cm. The anterior sternal lesion has decreased from 2 x 4 cm to 1.5 x 3.9 cm. There were no suspicious mediastinal, hilar or axillary adenopathy.  CA27.29 has decreased: 339.8 on 08/06/2014, 51.7 on 10/15/2014, and 37.6 on 12/10/2014.   Symptomatically, her grade II neuropathy is stable  Weight is up 2 pounds after her cruise.  She has not required pain medications in 6 weeks.  Exam reveals improving chest wall disease.  Plan: 1. Labs today: CBC with diff, CMP, CA27.29. 2. Day 1 of cycle #8 Halaven.  Maintain dose level -1 reduction (1.1 mg/m2) secondary to neuropathy. 3. Medical photographs. 4. RTC in 1 week for labs and day 8 of cycle #8 Halaven. 5. Chest, abdomen,  and pelvic CT scan on 01/20/2015- restaging. 6. RTC on 01/28/2015 for MD assessment, labs (CBC with diff, CMP, CA27.29), review of scans, and cycle #9 Halaven.   Lequita Asal, MD  01/07/2015, 2:41 PM

## 2015-01-07 NOTE — Progress Notes (Unsigned)
01/07/15 @14 :20 [name] returns to clinic this afternoon for consideration of cycle 8 day 1 Eribulin infusion. Her appointment was delayed by one week because she was vacationing  out of the country on a Cruise. Pt reports she has been doing well. States she has had very little side effects this past week. Phone PRO questionnaire reviewed with [name] [name] in clinic. Reports appetite returned and weight is up almost 2 lbs. States she is still experiencing some cold and numbness in her feet, but reports the tingling in her hands/fingers has improved. Patient continues to report mild fatigue and some insomnia, which are unchanged since prior assessment. States she did experience constipation, but did not take laxative to correct - only diet modification. Patient also completed QOL booklet in clinic. [name] in to examine patient and assessed and photographed her cutaneous lesion. VS are stable. CBC and Chemistries reviewed and are within acceptable parameters to proceed with C8D1 Eribulin infusion. Patient scheduled to return for C8D8 Eribulin in one week and CT scan in 2 weeks. Current adverse events with grade and attribution are as follows:   Fatigue - grade 1; deifnitely attributed  Alopecia - grade 1; definitely attributed   Insomnia - grade 1; unlikely related    Peripheral Neuropathy - grade 1; definitely attributed - reports improved   Constipation - grade 1 - possibly attributed

## 2015-01-08 LAB — CANCER ANTIGEN 27.29: CA 27.29: 26.4 U/mL (ref 0.0–38.6)

## 2015-01-14 ENCOUNTER — Other Ambulatory Visit: Payer: Medicare Other

## 2015-01-14 ENCOUNTER — Other Ambulatory Visit: Payer: Self-pay | Admitting: Hematology and Oncology

## 2015-01-14 ENCOUNTER — Inpatient Hospital Stay: Payer: Medicare Other | Attending: Hematology and Oncology

## 2015-01-14 ENCOUNTER — Inpatient Hospital Stay: Payer: Medicare Other

## 2015-01-14 ENCOUNTER — Encounter: Payer: Self-pay | Admitting: *Deleted

## 2015-01-14 VITALS — BP 111/73 | HR 77 | Temp 96.7°F | Resp 18

## 2015-01-14 DIAGNOSIS — C50912 Malignant neoplasm of unspecified site of left female breast: Secondary | ICD-10-CM | POA: Diagnosis not present

## 2015-01-14 DIAGNOSIS — F17210 Nicotine dependence, cigarettes, uncomplicated: Secondary | ICD-10-CM | POA: Insufficient documentation

## 2015-01-14 DIAGNOSIS — R5383 Other fatigue: Secondary | ICD-10-CM | POA: Diagnosis not present

## 2015-01-14 DIAGNOSIS — Z79899 Other long term (current) drug therapy: Secondary | ICD-10-CM | POA: Diagnosis not present

## 2015-01-14 DIAGNOSIS — Z5111 Encounter for antineoplastic chemotherapy: Secondary | ICD-10-CM | POA: Insufficient documentation

## 2015-01-14 DIAGNOSIS — Z853 Personal history of malignant neoplasm of breast: Secondary | ICD-10-CM | POA: Insufficient documentation

## 2015-01-14 DIAGNOSIS — Z171 Estrogen receptor negative status [ER-]: Secondary | ICD-10-CM | POA: Diagnosis not present

## 2015-01-14 DIAGNOSIS — G629 Polyneuropathy, unspecified: Secondary | ICD-10-CM | POA: Diagnosis not present

## 2015-01-14 LAB — CBC WITH DIFFERENTIAL/PLATELET
Basophils Absolute: 0 10*3/uL (ref 0–0.1)
Basophils Relative: 0 %
Eosinophils Absolute: 0 10*3/uL (ref 0–0.7)
Eosinophils Relative: 1 %
HCT: 37.5 % (ref 35.0–47.0)
Hemoglobin: 12.5 g/dL (ref 12.0–16.0)
Lymphocytes Relative: 43 %
Lymphs Abs: 1.5 10*3/uL (ref 1.0–3.6)
MCH: 28.7 pg (ref 26.0–34.0)
MCHC: 33.2 g/dL (ref 32.0–36.0)
MCV: 86.2 fL (ref 80.0–100.0)
Monocytes Absolute: 0.3 10*3/uL (ref 0.2–0.9)
Monocytes Relative: 8 %
Neutro Abs: 1.6 10*3/uL (ref 1.4–6.5)
Neutrophils Relative %: 48 %
Platelets: 200 10*3/uL (ref 150–440)
RBC: 4.35 MIL/uL (ref 3.80–5.20)
RDW: 19.3 % — ABNORMAL HIGH (ref 11.5–14.5)
WBC: 3.4 10*3/uL — ABNORMAL LOW (ref 3.6–11.0)

## 2015-01-14 MED ORDER — HEPARIN SOD (PORK) LOCK FLUSH 100 UNIT/ML IV SOLN
500.0000 [IU] | Freq: Once | INTRAVENOUS | Status: AC | PRN
  Administered 2015-01-14: 500 [IU]

## 2015-01-14 MED ORDER — INV-ERIBULIN CHEMO INJECTION 1 MG/2ML RU011201I ACCRU
1.1000 mg/m2 | Freq: Once | INTRAVENOUS | Status: AC
  Administered 2015-01-14: 2.15 mg via INTRAVENOUS
  Filled 2015-01-14: qty 4.3

## 2015-01-14 MED ORDER — SODIUM CHLORIDE 0.9 % IV SOLN
Freq: Once | INTRAVENOUS | Status: AC
  Administered 2015-01-14: 15:00:00 via INTRAVENOUS
  Filled 2015-01-14: qty 4

## 2015-01-14 MED ORDER — SODIUM CHLORIDE 0.9 % IJ SOLN
10.0000 mL | INTRAMUSCULAR | Status: DC | PRN
  Administered 2015-01-14: 10 mL via INTRAVENOUS
  Filled 2015-01-14: qty 10

## 2015-01-14 MED ORDER — HEPARIN SOD (PORK) LOCK FLUSH 100 UNIT/ML IV SOLN
500.0000 [IU] | Freq: Once | INTRAVENOUS | Status: AC
  Administered 2015-01-14: 500 [IU] via INTRAVENOUS
  Filled 2015-01-14: qty 5

## 2015-01-14 MED ORDER — SODIUM CHLORIDE 0.9 % IV SOLN
Freq: Once | INTRAVENOUS | Status: AC
  Administered 2015-01-14: 14:00:00 via INTRAVENOUS
  Filled 2015-01-14: qty 1000

## 2015-01-14 NOTE — Progress Notes (Unsigned)
01/14/15 @14 :20pm [name] [name] returns to clinic this afternoon for consideration of Cycle 8 Day 8 infusion of Eribulin. Reports she is doing well, but did experience return of some of her symptoms following her Eribulin infusion last week. Phone PRO questionnaire reviewed with patient, and she reports her appetite decreased significantly last week. Reports she is able to eat, but really does not want food. She also states her fatigue returned and is almost constant, but does not interfere with her ADLs at all. She continues to experience some occasional cold and numbness in her feet, and states the tingling in her hands/fingers has also returned. Patient reports ongoing mild  insomnia, some occasional constipation, and one episode of transient nausea.  VS are stable. CBC is within acceptable parameters to proceed with C8D8 Eribulin infusion as scheduled. [name] notified of all, and states OK to proceed with treatment. [name] states she is scheduled for her CT scan next week and she will go by and get her prep kit today. Current adverse events with grade and attribution are as follows:  Fatigue - grade 1; deifnitely attributed Alopecia - grade 1; definitely attributed Insomnia - grade 1; unlikely related  Peripheral Neuropathy - grade 1; definitely attributed   Constipation - grade 1; possibly attributed    Nausea - grade 1; possibly related    Diarrhea - grade 1; possibly related

## 2015-01-20 ENCOUNTER — Other Ambulatory Visit: Payer: Self-pay | Admitting: Hematology and Oncology

## 2015-01-20 ENCOUNTER — Encounter: Payer: Self-pay | Admitting: General Surgery

## 2015-01-20 ENCOUNTER — Ambulatory Visit
Admission: RE | Admit: 2015-01-20 | Discharge: 2015-01-20 | Disposition: A | Discharge status: Home / Self Care | Payer: Medicare Other | Source: Ambulatory Visit | Attending: Hematology and Oncology | Admitting: Hematology and Oncology

## 2015-01-20 ENCOUNTER — Encounter: Payer: Medicare Other | Attending: General Surgery | Admitting: General Surgery

## 2015-01-20 DIAGNOSIS — X58XXXD Exposure to other specified factors, subsequent encounter: Secondary | ICD-10-CM | POA: Insufficient documentation

## 2015-01-20 DIAGNOSIS — L599 Disorder of the skin and subcutaneous tissue related to radiation, unspecified: Secondary | ICD-10-CM

## 2015-01-20 DIAGNOSIS — S21001D Unspecified open wound of right breast, subsequent encounter: Secondary | ICD-10-CM | POA: Diagnosis not present

## 2015-01-20 DIAGNOSIS — C50412 Malignant neoplasm of upper-outer quadrant of left female breast: Secondary | ICD-10-CM | POA: Diagnosis not present

## 2015-01-20 DIAGNOSIS — R160 Hepatomegaly, not elsewhere classified: Secondary | ICD-10-CM | POA: Diagnosis not present

## 2015-01-20 DIAGNOSIS — S21002D Unspecified open wound of left breast, subsequent encounter: Secondary | ICD-10-CM | POA: Diagnosis not present

## 2015-01-20 DIAGNOSIS — C50912 Malignant neoplasm of unspecified site of left female breast: Secondary | ICD-10-CM | POA: Diagnosis not present

## 2015-01-20 DIAGNOSIS — C50812 Malignant neoplasm of overlapping sites of left female breast: Secondary | ICD-10-CM | POA: Diagnosis not present

## 2015-01-20 DIAGNOSIS — Y842 Radiological procedure and radiotherapy as the cause of abnormal reaction of the patient, or of later complication, without mention of misadventure at the time of the procedure: Secondary | ICD-10-CM | POA: Insufficient documentation

## 2015-01-20 DIAGNOSIS — L598 Other specified disorders of the skin and subcutaneous tissue related to radiation: Secondary | ICD-10-CM

## 2015-01-20 MED ORDER — IOHEXOL 300 MG/ML  SOLN
100.0000 mL | Freq: Once | INTRAMUSCULAR | Status: AC | PRN
  Administered 2015-01-20: 100 mL via INTRAVENOUS

## 2015-01-20 NOTE — Progress Notes (Addendum)
IRIS, HAIRSTON ([id number]) Visit Report for 01/20/2015 Chief Complaint Document Details Patient Name: [name], TRIMARCO 01/20/2015 8:45 Date of Service: AM Medical Record [id number] Number: Patient Account Number: [id number] 1950/12/07 (64 y.o. Treating RN: Cornell Barman Date of Birth/Sex: Female) Other Clinician: Primary Care Physician: PATIENT, NO Treating Sher Shampine Referring Physician: Physician/Extender: Weeks in Treatment: 27 Information Obtained from: Patient Chief Complaint Patient presents to the wound care center for a consult due non healing wound patient is a self-referral who comes with a history of having open wounds to her left chest wall and right chest wall for about 2 months. 07/18/2014 -- since last week the patient has had no complaints and is on her last cycle of chemotherapy. I have reviewed 155 pages of her reports from a medical oncologist and the summary is made in the history of present illness. Electronic Signature(s) Signed: 01/20/2015 5:36:04 PM By: Judene Companion MD Entered By: Judene Companion on 01/20/2015 09:45:25 [name] [name] ([id number]) -------------------------------------------------------------------------------- Debridement Details Patient Name: VELVET, MOOMAW 01/20/2015 8:45 Date of Service: AM Medical Record [id number] Number: Patient Account Number: [id number] [birth year] (64 y.o. Treating RN: Cornell Barman Date of Birth/Sex: Female) Other Clinician: Primary Care Physician: PATIENT, NO Treating Zara Wendt Referring Physician: Physician/Extender: Weeks in Treatment: 27 Debridement Performed for Wound #1 Left Chest Assessment: Performed By: Physician Judene Companion, MD Debridement: Debridement Pre-procedure Yes Verification/Time Out Taken: Start Time: 09:00 Pain Control: Other : lidocaine 4% Level: Skin/Subcutaneous Tissue Total Area Debrided (L x 1.5 (cm) x 3.8 (cm) = 5.7 (cm) W): Tissue and other Viable,  Non-Viable, Exudate, Fibrin/Slough, Subcutaneous material debrided: Instrument: Curette, Scissors Bleeding: Moderate Hemostasis Achieved: Pressure End Time: 09:05 Procedural Pain: 1 Post Procedural Pain: 1 Response to Treatment: Procedure was tolerated well Post Debridement Measurements of Total Wound Length: (cm) 1.5 Width: (cm) 3.8 Depth: (cm) 0.2 Volume: (cm) 0.895 Post Procedure Diagnosis Same as Pre-procedure Electronic Signature(s) Signed: 01/20/2015 9:25:31 AM By: Gretta Cool, RN, BSN, Kim RN, BSN Entered By: Gretta Cool, RN, BSN, Kim on 01/20/2015 47:42:59 [name] [name] ([id number]) -------------------------------------------------------------------------------- HPI Details Patient Name: [name] [name]. 01/20/2015 8:45 Date of Service: AM Medical Record [id number] Number: Patient Account Number: [id number] January 26, 1951 (64 y.o. Treating RN: Cornell Barman Date of Birth/Sex: Female) Other Clinician: Primary Care Physician: PATIENT, NO Treating Marise Knapper Referring Physician: Physician/Extender: Weeks in Treatment: 16 History of Present Illness HPI Description: this 64 year old patient has come by herself today as a self-referral and has no documentation with her. She has had open wounds to her left chest and the right chest wall for about 2 months. She is not a diabetic and has no significant medical history except breast cancer. Her right breast cancer was diagnosed 18 years ago and she had a mastectomy and chemotherapy. 3 years ago she was then diagnosed with a breast cancer of the left side which was advanced and could not be operated and was given chemotherapy for a year and then followed by radiation. Her last radiation was over a year ago. She has not had any biopsy done recently from the ulcerated area but she says there have been other biopsies done from the chest wall and these reports are not available at the present time. She was told to keep the wounds open and  let them dry out but she is finding it's more and more difficult to stop soiling her clothes if she leaves wounds open. She is on chemotherapy and we will try and obtain these notes from her oncologist. She is  not in pain and does not have any significant problems except the discomfort from an open wound on the chest wall. 07/18/2014 This is a summary of [name] [name] date of birth 1950/07/31. These are obtained by reviewing 151 pages of medical records which were obtained from the [facility] 64 year old patient who had a history of stage I right breast cancer status post modified radical mastectomy on 06/12/1996. 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 She received 4 cycles of AC at [facility]. In January 2014 she presented with left breast swelling and apparently cellulitis. Breast biopsy done revealed lobular carcinoma which was ER and PR positive and HER-2/neu negative. On PET scan and she was shown to have multiple areas of disease left breast, axilla and supraclavicular areas. She received 6 cycles of chemotherapy and then received Femara because she was not a surgical candidate. PET scan also revealed disease in the left lateral breast and new cervical lymph nodes at level II. The patient was then sent for radiation therapy and received this over a month. In January 2016 she had a PET CT scan done which was compatible with marked progression of disease with multifocal soft tissue masses in the chest wall left greater than right. She is currently receiving Xeloda and is noted to have disease in the chest wall which is widely present. Her most recent lab work from 07/02/2014 shows that her CMP is within normal limits with a serum albumin of 4.4. The previous CBC was within normal limits with a WBC count of 4.3 hemoglobin of 14 hematocrit of 40.9 and platelets of 202. After reading the reports as above I believe that this  patient has extensive stage IV breast carcinoma which has now infiltrated into the skin of the chest wall and has fungating tumors coming through in several places especially on the left. We will continue with palliative care and make her comfortable as much as possible as far as her wound care goes. Present time there is no odor but if this does occur we would use carbon- based products to mask the odor. [name] [name], [name] [name] ([id number]) Notes were also reviewed from her surgeon Dr. Hervey Ard --closure on 05/27/2014 for breast biopsy in view of the fact that she had extensive disease on her chest wall. Biopsies are taken from the nodular metastatic disease overlying the sternum and nodules chosen for biopsy. Punch biopsies were taken and the pathology on this confirmed that this was consistent with carcinoma 08/15/2014 -- she is started on a new course of injectable chemotherapy and this is 2 weeks on and 2 weeks off. She has no new symptoms no bleeding from the wounds nor is there any odor. She is doing fine otherwise. 09/16/2014. She is doing very well and the right side wounds have completely healed. She has finished 2 cycles of chemotherapy and she is 2 weeks on and 2 weeks off. He is to restart her chemotherapy this week. No fresh complaints pain is within control and there is no odor from the wounds. 10/21/2014 -- overall she is doing very well and is on a clinical trial with her medical oncologist. No issues with the wound on her left side and there is no odor or drainage. 11/25/2014 -- she continues to be under clinical trial protocol with the medical oncologist and has no fresh issues. Electronic Signature(s) Signed: 01/20/2015 5:36:04 PM By: Judene Companion MD Entered By: Judene Companion on 01/20/2015 09:46:27 [name] [name],  [name] [name] ([id number]) -------------------------------------------------------------------------------- Physical Exam Details Patient Name: [name] [name], [name] [name]  01/20/2015 8:45 Date of Service: AM Medical Record [id number] Number: Patient Account Number: [id number] [birth year] (64 y.o. Treating RN: Cornell Barman Date of Birth/Sex: Female) Other Clinician: Primary Care Physician: PATIENT, NO Treating Rosanna Bickle Referring Physician: Physician/Extender: Suella Grove in Treatment: 27 Electronic Signature(s) Signed: 01/20/2015 5:36:04 PM By: Judene Companion MD Entered By: Judene Companion on 01/20/2015 09:46:34 [name] [name] ([id number]) -------------------------------------------------------------------------------- Physician Orders Details Patient Name: [name] [name]. 01/20/2015 8:45 Date of Service: AM Medical Record [id number] Number: Patient Account Number: [id number] 19-Dec-1950 (64 y.o. Treating RN: Cornell Barman Date of Birth/Sex: Female) Other Clinician: Primary Care Physician: PATIENT, NO Treating Janissa Bertram Referring Physician: Physician/Extender: Suella Grove in Treatment: 16 Verbal / Phone Orders: Yes Clinician: Cornell Barman Read Back and Verified: Yes Diagnosis Coding Wound Cleansing Wound #1 Left Chest o Clean wound with Normal Saline. Anesthetic Wound #1 Left Chest o Topical Lidocaine 4% cream applied to wound bed prior to debridement Skin Barriers/Peri-Wound Care Wound #1 Left Chest o Skin Prep Primary Wound Dressing Wound #1 Left Chest o Aquacel Ag Secondary Dressing Wound #1 Left Chest o Boardered Foam Dressing Dressing Change Frequency Wound #1 Left Chest o Change dressing every other day. - or more if needed Follow-up Appointments Wound #1 Left Chest o Return Appointment in 1 month Electronic Signature(s) Signed: 01/21/2015 9:46:21 AM By: Judene Companion MD Previous Signature: 01/20/2015 9:25:31 AM Version By: Gretta Cool RN, BSN, Kim RN, BSN Biederman, [place]halem ([id number]) Entered By: Judene Companion on 01/21/2015 09:46:21 BENEDICTA, SULTAN  ([id number]) -------------------------------------------------------------------------------- Problem List Details Patient Name: [name] [name], [name] [name] 01/20/2015 8:45 Date of Service: AM Medical Record [id number] Number: Patient Account Number: [id number] June 29, 1950 (64 y.o. Treating RN: Cornell Barman Date of Birth/Sex: Female) Other Clinician: Primary Care Physician: PATIENT, NO Treating Marcus Schwandt Referring Physician: Physician/Extender: Weeks in Treatment: 27 Active Problems ICD-10 Encounter Code Description Active Date Diagnosis C50.812 Malignant neoplasm of overlapping sites of left female 07/11/2014 Yes breast S21.001A Unspecified open wound of right breast, initial encounter 07/11/2014 Yes C50.412 Malignant neoplasm of upper-outer quadrant of left female 07/11/2014 Yes breast S21.002A Unspecified open wound of left breast, initial encounter 07/11/2014 Yes Inactive Problems Resolved Problems Electronic Signature(s) Signed: 01/20/2015 5:36:04 PM By: Judene Companion MD Entered By: Judene Companion on 01/20/2015 09:45:10 [name] [name] ([id number]) -------------------------------------------------------------------------------- Progress Note Details Patient Name: [name] [name]. 01/20/2015 8:45 Date of Service: AM Medical Record [id number] Number: Patient Account Number: [id number] 04/15/1950 (64 y.o. Treating RN: Cornell Barman Date of Birth/Sex: Female) Other Clinician: Primary Care Physician: PATIENT, NO Treating Maks Cavallero Referring Physician: Physician/Extender: Weeks in Treatment: 27 Subjective Chief Complaint Information obtained from Patient Patient presents to the wound care center for a consult due non healing wound patient is a self-referral who comes with a history of having open wounds to her left chest wall and right chest wall for about 2 months. 07/18/2014 -- since last week the patient has had no complaints and is on her last cycle of chemotherapy.  I have reviewed 155 pages of her reports from a medical oncologist and the summary is made in the history of present illness. History of Present Illness (HPI) this 64 year old patient has come by herself today as a self-referral and has no documentation with her. She has had open wounds to her left chest and the right chest wall for about 2 months. She is not a diabetic and has no significant medical history except breast cancer. Her right breast cancer  was diagnosed 18 years ago and she had a mastectomy and chemotherapy. 3 years ago she was then diagnosed with a breast cancer of the left side which was advanced and could not be operated and was given chemotherapy for a year and then followed by radiation. Her last radiation was over a year ago. She has not had any biopsy done recently from the ulcerated area but she says there have been other biopsies done from the chest wall and these reports are not available at the present time. She was told to keep the wounds open and let them dry out but she is finding it's more and more difficult to stop soiling her clothes if she leaves wounds open. She is on chemotherapy and we will try and obtain these notes from her oncologist. She is not in pain and does not have any significant problems except the discomfort from an open wound on the chest wall. 07/18/2014 This is a summary of Erabella Kuipers date of birth [birth year]. These are obtained by reviewing 151 pages of medical records which were obtained from the [facility] 64 year old patient who had a history of stage I right breast cancer status post modified radical mastectomy on 06/12/1996. 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 She received 4 cycles of AC at [facility]. In January 2014 she presented with left breast swelling and apparently cellulitis. Breast biopsy done revealed lobular carcinoma which was ER and PR positive and  HER-2/neu negative. On PET scan and she was shown to have multiple areas of disease left breast, axilla and supraclavicular areas. She received 6 cycles of chemotherapy and then received Femara because she was not a surgical candidate. PET scan also revealed disease in the left lateral breast and new cervical lymph nodes at level II. The patient was then sent for radiation therapy and received this over a month. In January 2016 she had a PET CT scan done which was compatible with marked progression of disease with multifocal soft tissue masses in the chest wall left greater than right. [name] [name], [name] [name]. ([id number]) She is currently receiving Xeloda and is noted to have disease in the chest wall which is widely present. Her most recent lab work from 07/02/2014 shows that her CMP is within normal limits with a serum albumin of 4.4. The previous CBC was within normal limits with a WBC count of 4.3 hemoglobin of 14 hematocrit of 40.9 and platelets of 202. 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 We will continue with palliative care and make her comfortable as much as possible as far as her wound care goes. Present time there is no odor but if this does occur we would use carbon- based products to mask the odor. Notes were also reviewed from her surgeon Dr. Hervey Ard --closure on 05/27/2014 for breast biopsy in view of the fact that she had extensive disease on her chest wall. Biopsies are taken from the nodular metastatic disease overlying the sternum and nodules chosen for biopsy. Punch biopsies were taken and the pathology on this confirmed that this was consistent with carcinoma 08/15/2014 -- she is started on a new course of injectable chemotherapy and this is 2 weeks on and 2 weeks  off. She has no new symptoms  no bleeding from the wounds nor is there any odor. She is doing fine otherwise. 09/16/2014. She is doing very well and the right side wounds have completely healed. She has finished 2 cycles of chemotherapy and she is 2 weeks on and 2 weeks off. He is to restart her chemotherapy this week. No fresh complaints pain is within control and there is no odor from the wounds. 10/21/2014 -- overall she is doing very well and is on a clinical trial with her medical oncologist. No issues with the wound on her left side and there is no odor or drainage. 11/25/2014 -- she continues to be under clinical trial protocol with the medical oncologist and has no fresh issues. Objective Constitutional Vitals Time Taken: 8:50 AM, Height: 68 in, Weight: 180.5 lbs, BMI: 27.4, Temperature: 98.0 F, Pulse: 89 bpm, Respiratory Rate: 18 breaths/min, Blood Pressure: 130/76 mmHg. Integumentary (Hair, Skin) Wound #1 status is Open. Original cause of wound was Other Lesion. The wound is located on the Left Chest. The wound measures 1.5cm length x 3.8cm width x 0.1cm depth; 4.477cm^2 area and 0.448cm^3 volume. The wound is limited to skin breakdown. There is a small amount of serosanguineous drainage noted. The wound margin is distinct with the outline attached to the wound base. There is medium (34-66%) red granulation within the wound bed. There is a medium (34-66%) amount of necrotic tissue within the wound bed including Adherent Slough. The periwound skin appearance had no abnormalities noted for moisture. The periwound skin appearance had no abnormalities noted for color. The periwound skin appearance exhibited: Induration, Scarring. The periwound skin appearance did not exhibit: Callus, Crepitus, Excoriation, Fluctuance, Friable, Localized Edema, Rash. Periwound temperature was noted as Lahmann, Seren W. ([id number]) No Abnormality. Assessment Active Problems ICD-10 C50.812 - Malignant  neoplasm of overlapping sites of left female breast S21.001A - Unspecified open wound of right breast, initial encounter C50.412 - Malignant neoplasm of upper-outer quadrant of left female breast S21.002A - Unspecified open wound of left breast, initial encounter Procedures Wound #1 Wound #1 is a Malignant Wound located on the Left Chest . There was a Skin/Subcutaneous Tissue Debridement ([id number]) debridement with total area of 5.7 sq cm performed by Judene Companion, MD. with the following instrument(s): Curette and Scissors to remove Viable and Non-Viable tissue/material including Exudate, Fibrin/Slough, and Subcutaneous after achieving pain control using Other (lidocaine 4%). A time out was conducted prior to the start of the procedure. A Moderate amount of bleeding was controlled with Pressure. The procedure was tolerated well with a pain level of 1 throughout and a pain level of 1 following the procedure. Post Debridement Measurements: 1.5cm length x 3.8cm width x 0.2cm depth; 0.895cm^3 volume. Post procedure Diagnosis Wound #1: Same as Pre-Procedure Plan Wound Cleansing: Wound #1 Left Chest: Clean wound with Normal Saline. Anesthetic: Wound #1 Left Chest: Topical Lidocaine 4% cream applied to wound bed prior to debridement Skin Barriers/Peri-Wound Care: Wound #1 Left Chest: Skin Prep [name] [name], [name] [name] ([id number]) Primary Wound Dressing: Wound #1 Left Chest: Aquacel Ag Secondary Dressing: Wound #1 Left Chest: Boardered Foam Dressing Dressing Change Frequency: Wound #1 Left Chest: Change dressing every other day. - or more if needed Follow-up Appointments: Wound #1 Left Chest: Return Appointment in 1 month Follow-Up Appointments: A follow-up appointment should be scheduled. Medication Reconciliation completed and provided to Patient/Care Provider. A Patient Clinical Summary of Care was provided to EG Debrided ulcer left chest with curette wound 1x1.5 cm. Treat with  siver collagen dressings daily. Electronic Signature(s)  Signed: 01/20/2015 5:36:04 PM By: Judene Companion MD Entered By: Judene Companion on 01/20/2015 09:48:51 [name] [name] ([id number]) -------------------------------------------------------------------------------- SuperBill Details Patient Name: [name] [name] Date of Service: 01/20/2015 Medical Record Number: [id number] Patient Account Number: [id number] Date of Birth/Sex: 10/01/1950 (63 y.o. Female) Treating RN: Cornell Barman Primary Care Physician: PATIENT, NO Other Clinician: Referring Physician: Treating Physician/Extender: Benjaman Pott in Treatment: 27 Diagnosis Coding ICD-10 Codes Code Description C50.812 Malignant neoplasm of overlapping sites of left female breast S21.001A Unspecified open wound of right breast, initial encounter C50.412 Malignant neoplasm of upper-outer quadrant of left female breast S21.002A Unspecified open wound of left breast, initial encounter Facility Procedures CPT4 Code: [id number] Description: [place] - DEB SUBQ TISSUE 20 SQ CM/< ICD-10 Description Diagnosis S21.001A Unspecified open wound of right breast, initial en Modifier: counter Quantity: 1 Physician Procedures CPT4 Code Description: [id number] 36859 - WC PHYS LEVEL 2 - EST PT ICD-10 Description Diagnosis C50.812 Malignant neoplasm of overlapping sites of left fem C50.412 Malignant neoplasm of upper-outer quadrant of left Modifier: ale breast female brea Quantity: 1 st CPT4 Code Description: [id number] 11042 - WC PHYS SUBQ TISS 20 SQ CM ICD-10 Description Diagnosis S21.001A Unspecified open wound of right breast, initial enc Modifier: ounter Quantity: 1 Electronic Signature(s) Signed: 01/20/2015 5:36:04 PM By: Judene Companion MD Entered By: Judene Companion on 01/20/2015 09:49:48

## 2015-01-20 NOTE — Progress Notes (Signed)
seeiheal 

## 2015-01-20 NOTE — Progress Notes (Addendum)
ASLEE, SUCH ([id number]) Visit Report for 01/20/2015 Arrival Information Details Patient Name: [name] [name], [name] [name] Date of Service: 01/20/2015 8:45 AM Medical Record Number: [id number] Patient Account Number: [id number] Date of Birth/Sex: 08-15-1950 (64 y.o. Female) Treating RN: Cornell Barman Primary Care Physician: PATIENT, NO Other Clinician: Referring Physician: Treating Physician/Extender: Benjaman Pott in Treatment: 27 Visit Information History Since Last Visit Added or deleted any medications: No Patient Arrived: Ambulatory Any new allergies or adverse reactions: No Arrival Time: 08:50 Had a fall or experienced change in No Accompanied By: self activities of daily living that may affect Transfer Assistance: None risk of falls: Patient Identification Verified: Yes Signs or symptoms of abuse/neglect since last No Secondary Verification Process Yes visito Completed: Has Dressing in Place as Prescribed: Yes Patient Requires Transmission-Based No Pain Present Now: No Precautions: Patient Has Alerts: No Electronic Signature(s) Signed: 01/20/2015 5:36:04 PM By: Judene Companion MD Previous Signature: 01/20/2015 9:25:31 AM Version By: Gretta Cool RN, BSN, Kim RN, BSN Entered By: Judene Companion on 01/20/2015 09:44:39 [name] [name] ([id number]) -------------------------------------------------------------------------------- Encounter Discharge Information Details Patient Name: [name] [name] Date of Service: 01/20/2015 8:45 AM Medical Record Number: [id number] Patient Account Number: [id number] Date of Birth/Sex: Jul 22, 1950 (64 y.o. Female) Treating RN: Cornell Barman Primary Care Physician: PATIENT, NO Other Clinician: Referring Physician: Treating Physician/Extender: Benjaman Pott in Treatment: 54 Encounter Discharge Information Items Discharge Pain Level: 1 Discharge Condition: Stable Ambulatory Status: Ambulatory Discharge Destination: Home Transportation:  Other Accompanied By: self Schedule Follow-up Appointment: Yes Medication Reconciliation completed and provided to Patient/Care Yes Zafir Schauer: Provided on Clinical Summary of Care: 01/20/2015 Form Type Recipient Paper Patient EG Electronic Signature(s) Signed: 01/20/2015 5:36:04 PM By: Judene Companion MD Previous Signature: 01/20/2015 9:25:31 AM Version By: Gretta Cool RN, BSN, Kim RN, BSN Previous Signature: 01/20/2015 9:08:57 AM Version By: Ruthine Dose Entered By: Judene Companion on 01/20/2015 09:50:17 [name] [name] ([id number]) -------------------------------------------------------------------------------- Multi Wound Chart Details Patient Name: [name] [name] Date of Service: 01/20/2015 8:45 AM Medical Record Number: [id number] Patient Account Number: [id number] Date of Birth/Sex: 1950/12/04 (64 y.o. Female) Treating RN: Cornell Barman Primary Care Physician: PATIENT, NO Other Clinician: Referring Physician: Treating Physician/Extender: Benjaman Pott in Treatment: 27 Vital Signs Height(in): 68 Pulse(bpm): 89 Weight(lbs): 180.5 Blood Pressure 130/76 (mmHg): Body Mass Index(BMI): 27 Temperature(F): 98.0 Respiratory Rate 18 (breaths/min): Photos: [1:No Photos] [N/A:N/A] Wound Location: [1:Left Chest] [N/A:N/A] Wounding Event: [1:Other Lesion] [N/A:N/A] Primary Etiology: [1:Malignant Wound] [N/A:N/A] Comorbid History: [1:Received Chemotherapy, Received Radiation] [N/A:N/A] Date Acquired: [1:05/10/2014] [N/A:N/A] Weeks of Treatment: [1:27] [N/A:N/A] Wound Status: [1:Open] [N/A:N/A] Measurements L x W x D 1.5x3.8x0.1 [N/A:N/A] (cm) Area (cm) : [1:4.477] [N/A:N/A] Volume (cm) : [1:0.448] [N/A:N/A] % Reduction in Area: [1:81.00%] [N/A:N/A] % Reduction in Volume: 90.50% [N/A:N/A] Classification: [1:Full Thickness Without Exposed Support Structures] [N/A:N/A] Exudate Amount: [1:Small] [N/A:N/A] Exudate Type: [1:Serosanguineous] [N/A:N/A] Exudate Color: [1:red,  brown] [N/A:N/A] Wound Margin: [1:Distinct, outline attached] [N/A:N/A] Granulation Amount: [1:Medium (34-66%)] [N/A:N/A] Granulation Quality: [1:Red] [N/A:N/A] Necrotic Amount: [1:Medium (34-66%)] [N/A:N/A] Exposed Structures: [1:Fascia: No Fat: No Tendon: No Muscle: No Joint: No] [N/A:N/A] Bone: No Limited to Skin Breakdown Epithelialization: None N/A N/A Periwound Skin Texture: Induration: Yes N/A N/A Scarring: Yes Edema: No Excoriation: No Callus: No Crepitus: No Fluctuance: No Friable: No Rash: No Periwound Skin No Abnormalities Noted N/A N/A Moisture: Periwound Skin Color: No Abnormalities Noted N/A N/A Temperature: No Abnormality N/A N/A Tenderness on No N/A N/A Palpation: Wound Preparation: Ulcer Cleansing: N/A N/A Rinsed/Irrigated with Saline Topical Anesthetic Applied: None Treatment Notes Electronic Signature(s) Signed:  01/20/2015 9:25:31 AM By: Gretta Cool, RN, BSN, Kim RN, BSN Entered By: Gretta Cool, RN, BSN, Kim on 01/20/2015 08:57:42 [name] [name] ([id number]) -------------------------------------------------------------------------------- [place] Details Patient Name: [name] [name] Date of Service: 01/20/2015 8:45 AM Medical Record Number: [id number] Patient Account Number: [id number] Date of Birth/Sex: 1950-09-30 (64 y.o. Female) Treating RN: Cornell Barman Primary Care Physician: PATIENT, NO Other Clinician: Referring Physician: Treating Physician/Extender: Benjaman Pott in Treatment: 41 Active Inactive Abuse / Safety / Falls / Self Care Management Nursing Diagnoses: Potential for falls Goals: Patient will remain injury free Date Initiated: 07/11/2014 Goal Status: Active Interventions: Assess fall risk on admission and as needed Notes: Malignancy/Atypical Etiology Nursing Diagnoses: Knowledge deficit related to disease process and management of atypical ulcer etiology Goals: Patient/caregiver will verbalize  understanding of disease process and disease management of atypical ulcer etiology Date Initiated: 07/11/2014 Goal Status: Active Interventions: Provide education on atypical ulcer etiologies Notes: Nutrition Nursing Diagnoses: Potential for alteratiion in Nutrition/Potential for imbalanced nutrition Goals: [name] [name], [name] [name] ([id number]) Patient/caregiver agrees to and verbalizes understanding of need to use nutritional supplements and/or vitamins as prescribed Date Initiated: 07/11/2014 Goal Status: Active Interventions: Assess patient nutrition upon admission and as needed per policy Notes: Orientation to the Wound Care Program Nursing Diagnoses: Knowledge deficit related to the wound healing center program Goals: Patient/caregiver will verbalize understanding of the [facility] Date Initiated: 07/11/2014 Goal Status: Active Interventions: Provide education on orientation to the wound center Notes: Wound/Skin Impairment Nursing Diagnoses: Impaired tissue integrity Goals: Patient will demonstrate a reduced rate of smoking or cessation of smoking Date Initiated: 07/11/2014 Goal Status: Active Patient will have a decrease in wound volume by X% from date: (specify in notes) Date Initiated: 07/11/2014 Goal Status: Active Patient/caregiver will verbalize understanding of skin care regimen Date Initiated: 07/11/2014 Goal Status: Active Ulcer/skin breakdown will have a volume reduction of 30% by week 4 Date Initiated: 07/11/2014 Goal Status: Active Ulcer/skin breakdown will have a volume reduction of 50% by week 8 Date Initiated: 07/11/2014 Goal Status: Active Ulcer/skin breakdown will have a volume reduction of 80% by week 12 [name] [name], [name] [name] ([id number]) Date Initiated: 07/11/2014 Goal Status: Active Ulcer/skin breakdown will heal within 14 weeks Date Initiated: 07/11/2014 Goal Status: Active Interventions: Assess patient/caregiver ability to obtain necessary  supplies Assess patient/caregiver ability to perform ulcer/skin care regimen upon admission and as needed Assess ulceration(s) every visit Provide education on smoking Provide education on ulcer and skin care Screen for HBO Notes: Electronic Signature(s) Signed: 01/20/2015 9:25:31 AM By: Gretta Cool, RN, BSN, Kim RN, BSN Entered By: Gretta Cool, RN, BSN, Kim on 01/20/2015 08:57:36 [name] [name] ([id number]) -------------------------------------------------------------------------------- Pain Assessment Details Patient Name: [name] [name] Date of Service: 01/20/2015 8:45 AM Medical Record Number: [id number] Patient Account Number: [id number] Date of Birth/Sex: 1951/01/07 (64 y.o. Female) Treating RN: Cornell Barman Primary Care Physician: PATIENT, NO Other Clinician: Referring Physician: Treating Physician/Extender: Benjaman Pott in Treatment: 27 Active Problems Location of Pain Severity and Description of Pain Patient Has Paino No Site Locations Pain Management and Medication Current Pain Management: Electronic Signature(s) Signed: 01/20/2015 9:25:31 AM By: Gretta Cool, RN, BSN, Kim RN, BSN Entered By: Gretta Cool, RN, BSN, Kim on 01/20/2015 08:50:52 [name] [name] ([id number]) -------------------------------------------------------------------------------- Patient/Caregiver Education Details Patient Name: [name] [name] Date of Service: 01/20/2015 8:45 AM Medical Record Number: [id number] Patient Account Number: [id number] Date of Birth/Gender: 07-13-1950 (64 y.o. Female) Treating RN: Cornell Barman Primary Care Physician: PATIENT, NO Other Clinician: Referring Physician: Treating Physician/Extender: Benjaman Pott  in Treatment: 27 Education Assessment Education Provided To: Patient Education Topics Provided Wound/Skin Impairment: Handouts: Caring for Your Ulcer, Other: continue wound care as prescribed Methods: Demonstration, Explain/Verbal Responses: State content  correctly Electronic Signature(s) Signed: 01/20/2015 5:36:04 PM By: Judene Companion MD Previous Signature: 01/20/2015 9:25:31 AM Version By: Gretta Cool RN, BSN, Kim RN, BSN Entered By: Judene Companion on 01/20/2015 09:50:28 [name] [name] ([id number]) -------------------------------------------------------------------------------- Wound Assessment Details Patient Name: [name] [name] Date of Service: 01/20/2015 8:45 AM Medical Record Number: [id number] Patient Account Number: [id number] Date of Birth/Sex: July 09, 1950 (64 y.o. Female) Treating RN: Cornell Barman Primary Care Physician: PATIENT, NO Other Clinician: Referring Physician: Treating Physician/Extender: Benjaman Pott in Treatment: 27 Wound Status Wound Number: 1 Primary Malignant Wound Etiology: Wound Location: Left Chest Wound Status: Open Wounding Event: Other Lesion Comorbid Received Chemotherapy, Received Date Acquired: 05/10/2014 History: Radiation Weeks Of Treatment: 27 Clustered Wound: No Photos Wound Measurements Length: (cm) 1.5 Width: (cm) 3.8 Depth: (cm) 0.1 Area: (cm) 4.477 Volume: (cm) 0.448 % Reduction in Area: 81% % Reduction in Volume: 90.5% Epithelialization: None Wound Description Full Thickness Without Exposed Classification: Support Structures Wound Margin: Distinct, outline attached Exudate Medium Amount: Exudate Type: Serosanguineous Exudate Color: red, brown Foul Odor After Cleansing: No Wound Bed Granulation Amount: Medium (34-66%) Exposed Structure Granulation Quality: Red Fascia Exposed: No Necrotic Amount: Medium (34-66%) Fat Layer Exposed: No [name], [name] ([id number]) Necrotic Quality: Adherent Slough Tendon Exposed: No Muscle Exposed: No Joint Exposed: No Bone Exposed: No Limited to Skin Breakdown Periwound Skin Texture Texture Color No Abnormalities Noted: No No Abnormalities Noted: Yes Callus: No Temperature / Pain Crepitus: No Temperature: No  Abnormality Excoriation: No Fluctuance: No Friable: No Induration: Yes Localized Edema: No Rash: No Scarring: Yes Moisture No Abnormalities Noted: Yes Wound Preparation Ulcer Cleansing: Rinsed/Irrigated with Saline Topical Anesthetic Applied: None Treatment Notes Wound #1 (Left Chest) 1. Cleansed with: Clean wound with Normal Saline 2. Anesthetic Topical Lidocaine 4% cream to wound bed prior to debridement 4. Dressing Applied: Aquacel Ag 5. Secondary Dressing Applied Bordered Foam Dressing Electronic Signature(s) Signed: 01/30/2015 5:46:49 PM By: Gretta Cool RN, BSN, Kim RN, BSN Previous Signature: 01/20/2015 9:25:31 AM Version By: Gretta Cool RN, BSN, Kim RN, BSN Entered By: Gretta Cool, RN, BSN, Kim on 01/30/2015 16:26:13 [name] [name] ([id number]) -------------------------------------------------------------------------------- [place] Details Patient Name: [name] [name] Date of Service: 01/20/2015 8:45 AM Medical Record Number: [id number] Patient Account Number: [id number] Date of Birth/Sex: May 23, 1950 (64 y.o. Female) Treating RN: Cornell Barman Primary Care Physician: PATIENT, NO Other Clinician: Referring Physician: Treating Physician/Extender: Benjaman Pott in Treatment: 27 Vital Signs Time Taken: 08:50 Temperature (F): 98.0 Height (in): 68 Pulse (bpm): 89 Weight (lbs): 180.5 Respiratory Rate (breaths/min): 18 Body Mass Index (BMI): 27.4 Blood Pressure (mmHg): 130/76 Reference Range: 80 - 120 mg / dl Electronic Signature(s) Signed: 01/20/2015 9:25:31 AM By: Gretta Cool, RN, BSN, Kim RN, BSN Entered By: Gretta Cool, RN, BSN, Kim on 01/20/2015 08:51:09

## 2015-01-27 ENCOUNTER — Other Ambulatory Visit: Payer: Self-pay

## 2015-01-27 DIAGNOSIS — C50912 Malignant neoplasm of unspecified site of left female breast: Secondary | ICD-10-CM

## 2015-01-28 ENCOUNTER — Inpatient Hospital Stay: Payer: Medicare Other

## 2015-01-28 ENCOUNTER — Inpatient Hospital Stay (HOSPITAL_BASED_OUTPATIENT_CLINIC_OR_DEPARTMENT_OTHER): Payer: Medicare Other | Admitting: Hematology and Oncology

## 2015-01-28 ENCOUNTER — Encounter: Payer: Self-pay | Admitting: *Deleted

## 2015-01-28 VITALS — BP 128/79 | HR 94 | Temp 97.1°F | Resp 18 | Ht 67.5 in | Wt 168.2 lb

## 2015-01-28 DIAGNOSIS — Z853 Personal history of malignant neoplasm of breast: Secondary | ICD-10-CM

## 2015-01-28 DIAGNOSIS — G629 Polyneuropathy, unspecified: Secondary | ICD-10-CM | POA: Diagnosis not present

## 2015-01-28 DIAGNOSIS — Z5111 Encounter for antineoplastic chemotherapy: Secondary | ICD-10-CM | POA: Diagnosis not present

## 2015-01-28 DIAGNOSIS — F17210 Nicotine dependence, cigarettes, uncomplicated: Secondary | ICD-10-CM

## 2015-01-28 DIAGNOSIS — R5383 Other fatigue: Secondary | ICD-10-CM

## 2015-01-28 DIAGNOSIS — Z171 Estrogen receptor negative status [ER-]: Secondary | ICD-10-CM

## 2015-01-28 DIAGNOSIS — C50912 Malignant neoplasm of unspecified site of left female breast: Secondary | ICD-10-CM | POA: Diagnosis not present

## 2015-01-28 DIAGNOSIS — Z79899 Other long term (current) drug therapy: Secondary | ICD-10-CM

## 2015-01-28 DIAGNOSIS — R634 Abnormal weight loss: Secondary | ICD-10-CM

## 2015-01-28 LAB — CBC WITH DIFFERENTIAL/PLATELET
Basophils Absolute: 0 10*3/uL (ref 0–0.1)
Basophils Relative: 1 %
Eosinophils Absolute: 0 10*3/uL (ref 0–0.7)
Eosinophils Relative: 0 %
HCT: 40.5 % (ref 35.0–47.0)
Hemoglobin: 13.4 g/dL (ref 12.0–16.0)
Lymphocytes Relative: 46 %
Lymphs Abs: 1.4 10*3/uL (ref 1.0–3.6)
MCH: 28.4 pg (ref 26.0–34.0)
MCHC: 33 g/dL (ref 32.0–36.0)
MCV: 86.1 fL (ref 80.0–100.0)
Monocytes Absolute: 0.6 10*3/uL (ref 0.2–0.9)
Monocytes Relative: 19 %
Neutro Abs: 1 10*3/uL — ABNORMAL LOW (ref 1.4–6.5)
Neutrophils Relative %: 34 %
Platelets: 216 10*3/uL (ref 150–440)
RBC: 4.7 MIL/uL (ref 3.80–5.20)
RDW: 19.8 % — ABNORMAL HIGH (ref 11.5–14.5)
WBC: 3.1 10*3/uL — ABNORMAL LOW (ref 3.6–11.0)

## 2015-01-28 LAB — COMPREHENSIVE METABOLIC PANEL
ALT: 20 U/L (ref 14–54)
AST: 26 U/L (ref 15–41)
Albumin: 3.9 g/dL (ref 3.5–5.0)
Alkaline Phosphatase: 87 U/L (ref 38–126)
Anion gap: 6 (ref 5–15)
BUN: 14 mg/dL (ref 6–20)
CO2: 24 mmol/L (ref 22–32)
Calcium: 8.4 mg/dL — ABNORMAL LOW (ref 8.9–10.3)
Chloride: 107 mmol/L (ref 101–111)
Creatinine, Ser: 0.9 mg/dL (ref 0.44–1.00)
GFR calc Af Amer: >60 mL/min (ref >60)
GFR calc non Af Amer: >60 mL/min (ref >60)
Glucose, Bld: 109 mg/dL — ABNORMAL HIGH (ref 65–99)
Potassium: 3.7 mmol/L (ref 3.5–5.1)
Sodium: 137 mmol/L (ref 135–145)
Total Bilirubin: 0.3 mg/dL (ref 0.3–1.2)
Total Protein: 7.7 g/dL (ref 6.5–8.1)

## 2015-01-28 MED ORDER — SODIUM CHLORIDE 0.9 % IJ SOLN
10.0000 mL | Freq: Once | INTRAMUSCULAR | Status: AC
  Administered 2015-01-28: 10 mL via INTRAVENOUS
  Filled 2015-01-28: qty 10

## 2015-01-28 MED ORDER — HEPARIN SOD (PORK) LOCK FLUSH 100 UNIT/ML IV SOLN
500.0000 [IU] | Freq: Once | INTRAVENOUS | Status: AC
  Administered 2015-01-28: 500 [IU] via INTRAVENOUS
  Filled 2015-01-28: qty 5

## 2015-01-28 NOTE — Progress Notes (Signed)
Patient is here for follow-up of breast cancer and halven treatment. Patient states that she does not have any appetite. She also states that her energy level is low and she feels like this is probably coming from a lack of eating.

## 2015-01-28 NOTE — Progress Notes (Unsigned)
01/28/2015 @14 :25pm [name] [name] returns to clinic this afternoon for consideration of Cycle 9 Eribulin infusion. Reviewed phone PRO-CTCAE form with patient, who reports she is experiencing increased fatigue since her last infusion. She continues to experience some nausea, constipation and states she has no appetite. Her weight continues to drop and she has lost between 8-9% of her original weight since beginning the Eribulin. [name] [name] also continues to report numbness and tingling in her hands - more so on the left than the right, and continues to experience occasional coldness in her feet and pain when she walks and now states she is experiencing some numbness in her lower legs at times. She denies any left breast/chest pain and states she has not needed to take any of her prn medication. She was seen in the wound clinic last week and her open cutaneous lesion on the left lateral chest continues to heal. [name] [name] reports they debrided this area last week and the tissue is granulating with small amount of serous drainage noted, and it measures 1.3 x 3.0 cm today. She has a small area on her left breast incision that she reports is occasionally opening up and bleeding a little, but not today. Photos were taken of anterior and left laterlal chest to document cutaneous lesions. [name] [name] had her CT scan on 01/20/15, which revealed two new subcutaneous nodules on her left chest. For this reason, she will have to stop the Eribulin and come off of the [id number] I protocol. [name] [name] was informed of this and will not receive treatment today, but [name] has discussed starting her on weekly Taxol as her next regimen. Patient requests to wait 2 weeks before beginning the new treatment because she has a trip planned to visit family out of town next week. Appointments made for patient to return in 2 weeks for Taxol. Continuing AEs with grade and attribution are as follows:      Fatigue - grade 1;  definitely attributed Alopecia - grade 1; definitely attributed Insomnia - grade 1; unlikely related  Peripheral Neuropathy - grade 2; definitely attributed   Constipation - grade 1; possibly attributed Nausea - grade 1; possibly related Weight loss(8%) - grade 1; definitely related    Anorexia - grade 1; definitely related

## 2015-01-28 NOTE — Progress Notes (Signed)
[facility] day:  01/28/2015   Chief Complaint: [name] [name] is a 64 y.o. female with recurrent breast cancer who is seen for review of interval restaging studies prior to day 1 of cycle #9 Halaven.  HPI: The patient was last seen in the medical oncology clinic on 01/07/2015.  At that time, her grade II neuropathy was stable.  She had some numbness in her feet.  Hands had improved.  Weight was up 2 pounds after her cruise.  She had not required pain medications in 6 weeks.  Exam revealed healing chest wall disease.  CA27.29 had decreased to 26.4.  She received chemotherapy that day.  She returned on 01/14/2015 for day 8 Halaven.  Chest, abdomen, and pelvic CT scan on 01/20/2015 revealed inflammatory left breast cancer with new necrotic appearing subcutaneous nodules in the lateral left breast and lateral left chest wall.  Specifically, there was an irregular soft tissue density is seen in the left breast measuring approximately 2.1 x 3.3 cm, stable. There was an overlying necrotic appearing lesion, deep to the skin, measuring 2.0 x 2.3 cm, new.  A second somewhat necrotic appearing nodule was seen along the lateral mid chest wall, deep to the skin surface, measuring 1.2 x 1.7 cm.   During the interim, she notes increased fatigue.  She describes no appetite.  She has constipation.  She has lost 14-15 pounds since initiation of Halaven (4 pounds since last visit).  She notes continued numbness in her feet and tingling in her hands. Feet feel cold at times.  Chest wall wound continues to heal.  It was debrided last week at the wound center.  Past Medical History  Diagnosis Date  . Personal history of malignant neoplasm of breast   . Anemia   . Blood transfusion without reported diagnosis   . Emphysema of lung ([place])   . Breast cancer [facility])     Past Surgical History  Procedure Laterality Date  . Oophrectomy  1980  . Abdominal hysterectomy  1995  .  Breast surgery Right 1996    mastectomy  . Breast biopsy Left 2014  . Breast biopsy Left 01-21-14    Family History  Problem Relation Age of Onset  . Cancer Father   . Heart disease Sister   . Diabetes Sister   . Heart disease Brother   . Cancer Paternal Grandmother     breast    Social History:  reports that she has been smoking Cigarettes.  She has a 25 pack-year smoking history. She has never used smokeless tobacco. She reports that she does not drink alcohol or use illicit drugs.  She will be traveling to [place] next week.  The patient is accompanied by Karma Ganja, protocol nurse.  Allergies:  Allergies  Allergen Reactions  . Sulfa Antibiotics Nausea And Vomiting    headache    Current Medications: Current Outpatient Prescriptions  Medication Sig Dispense Refill  . Calcium Carbonate-Vitamin D (CALCIUM + D PO) Take 2 tablets by mouth daily.    Marland Kitchen KLOR-CON 10 10 MEQ tablet TAKE 1 TABLET (10 MEQ TOTAL) BY MOUTH DAILY. 30 tablet 1  . LORazepam (ATIVAN) 0.5 MG tablet   0  . traMADol (ULTRAM) 50 MG tablet Take 1 tablet (50 mg total) by mouth every 8 (eight) hours as needed. 90 tablet 0   No current facility-administered medications for this visit.   Facility-Administered Medications Ordered in Other Visits  Medication Dose Route Frequency  Provider Last Rate Last Dose  . sodium chloride 0.9 % injection 10 mL  10 mL Intracatheter PRN Lequita Asal, MD      . sodium chloride 0.9 % injection 10 mL  10 mL Intracatheter PRN Lequita Asal, MD   10 mL at 08/27/14 1400  . sodium chloride 0.9 % injection 10 mL  10 mL Intravenous PRN Lequita Asal, MD   10 mL at 10/08/14 1335  . sodium chloride 0.9 % injection 10 mL  10 mL Intracatheter PRN Lequita Asal, MD      . sodium chloride 0.9 % injection 10 mL  10 mL Intracatheter PRN Lequita Asal, MD   10 mL at 11/26/14 1355    Review of Systems:  GENERAL:  Increased fatigue.  No fevers or sweats.  Weight down  4 pounds since last cycle. PERFORMANCE STATUS (ECOG):  1 HEENT:  No visual changes, runny nose, sore throat, mouth sores or tenderness. Lungs: No shortness of breath or cough.  No hemoptysis. Cardiac:  No chest pain, palpitations, orthopnea, or PND. GI:  No appetite.  Constipation.  No nausea, vomiting, diarrhea, melena or hematochezia. GU:  No urgency, frequency, dysuria, or hematuria. Musculoskeletal:  No back pain.  No joint pain.  No muscle tenderness. Extremities:  No pain or swelling. Skin:  Hair thinning.  No rashes or skin changes. Neuro:  Neuropathy in feet.  Hands tingling.  No headache, numbness or weakness, or coordination issues. Endocrine:  No diabetes, thyroid issues, hot flashes or night sweats. Psych:  No mood changes, depression or anxiety. Pain:  Decreased pain.  No need for pain medications in 2 weeks. Review of systems:  All other systems reviewed and found to be negative.   Physical Exam: Blood pressure 128/79, pulse 94, temperature 97.1 F (36.2 C), temperature source Tympanic, resp. rate 18, height 5' 7.5" (1.715 m), weight 168 lb 3.4 oz (76.3 kg).  GENERAL: Well developed, well nourished, sitting comfortably in the exam room in no acute distress. MENTAL STATUS: Alert and oriented to person, place and time. HEAD: Alopecia. Normocephalic, atraumatic, face symmetric, no Cushingoid features. EYES: Brown eyes. Pupils equal round and reactive to light and accomodation. No conjunctivitis or scleral icterus. ENT: Oropharynx clear without lesion. Tongue normal. Mucous membranes moist.  RESPIRATORY: Clear to auscultation without rales, wheezes or rhonchi. CARDIOVASCULAR: Regular rate and rhythm without murmur, rub or gallop. BREAST: Right chest wall lesions healed. Central upper sternal area flat.  Left lateral denuded area triangular in shape (3 x 1.3 cm). ABDOMEN: Soft, non-tender, with active bowel sounds, and no hepatosplenomegaly. No masses. SKIN:  Cutaneous breast cancer noted in breast section. No other lesions.  Long nails. EXTREMITIES: No edema, no skin discoloration or tenderness. No palpable cords. LYMPH NODES: Left axillary ridge. No palpable cervical, supraclavicular, axillary or inguinal adenopathy  NEUROLOGICAL: Unremarkable. PSYCH: Appropriate.  Infusion on 01/28/2015  Component Date Value Ref Range Status  . WBC 01/28/2015 3.1* 3.6 - 11.0 K/uL Final   A-LINE DRAW  . RBC 01/28/2015 4.70  3.80 - 5.20 MIL/uL Final  . Hemoglobin 01/28/2015 13.4  12.0 - 16.0 g/dL Final  . HCT 01/28/2015 40.5  35.0 - 47.0 % Final  . MCV 01/28/2015 86.1  80.0 - 100.0 fL Final  . MCH 01/28/2015 28.4  26.0 - 34.0 pg Final  . MCHC 01/28/2015 33.0  32.0 - 36.0 g/dL Final  . RDW 01/28/2015 19.8* 11.5 - 14.5 % Final  . Platelets 01/28/2015 216  150 -  440 K/uL Final  . Neutrophils Relative % 01/28/2015 34   Final  . Neutro Abs 01/28/2015 1.0* 1.4 - 6.5 K/uL Final  . Lymphocytes Relative 01/28/2015 46   Final  . Lymphs Abs 01/28/2015 1.4  1.0 - 3.6 K/uL Final  . Monocytes Relative 01/28/2015 19   Final  . Monocytes Absolute 01/28/2015 0.6  0.2 - 0.9 K/uL Final  . Eosinophils Relative 01/28/2015 0   Final  . Eosinophils Absolute 01/28/2015 0.0  0 - 0.7 K/uL Final  . Basophils Relative 01/28/2015 1   Final  . Basophils Absolute 01/28/2015 0.0  0 - 0.1 K/uL Final  . Sodium 01/28/2015 137  135 - 145 mmol/L Final  . Potassium 01/28/2015 3.7  3.5 - 5.1 mmol/L Final  . Chloride 01/28/2015 107  101 - 111 mmol/L Final  . CO2 01/28/2015 24  22 - 32 mmol/L Final  . Glucose, Bld 01/28/2015 109* 65 - 99 mg/dL Final  . BUN 01/28/2015 14  6 - 20 mg/dL Final  . Creatinine, Ser 01/28/2015 0.90  0.44 - 1.00 mg/dL Final  . Calcium 01/28/2015 8.4* 8.9 - 10.3 mg/dL Final  . Total Protein 01/28/2015 7.7  6.5 - 8.1 g/dL Final  . Albumin 01/28/2015 3.9  3.5 - 5.0 g/dL Final  . AST 01/28/2015 26  15 - 41 U/L Final  . ALT 01/28/2015 20  14 - 54 U/L Final  .  Alkaline Phosphatase 01/28/2015 87  38 - 126 U/L Final  . Total Bilirubin 01/28/2015 0.3  0.3 - 1.2 mg/dL Final  . GFR calc non Af Amer 01/28/2015 >60  >60 mL/min Final  . GFR calc Af Amer 01/28/2015 >60  >60 mL/min Final   Comment: (NOTE) The eGFR has been calculated using the CKD EPI equation. This calculation has not been validated in all clinical situations. eGFR's persistently <60 mL/min signify possible Chronic Kidney Disease.   . Anion gap 01/28/2015 6  5 - 15 Final    Assessment:  [name] [name] is a 64 y.o. African American woman with a history of stage I right breast cancer s/p modified radical mastectomy on 06/12/1996. Pathology revealed a grade III mutifocal infiltrating ductal carcinoma (tumor size 8.5 cm with an invasive component 1.6 cm). Twenty-one lymph nodes were negative. Tumor was ER/PR negative. She received 4 [facility]  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 PET scan on 05/22/2012 revealed multiple areas of disease. She received 6 cycles of Taxotere and Cytoxan (TC) from 06/06/2012 - 09/29/2012. PET scan on 09/07/2012 noted marked improvement. Post chemotherapy biopsy was positive. She was not a surgical candidate. She was placed on Femara.  PET scan on 05/03/2013 revealed a new focus along lateral left breast and a new level II cervical lymph node. She received radiation from 09/13/2013 - 11/05/2013. PET scan on 01/01/2014 revealed new nodule medial left chest wall pleural/paraspinal activity LUL. Multiple biopsies on 01/19/2014 were positive. She began daily letrozole and Ibrance on 02/12/2014. Despite treatment, new nodules formed. She received a total of 2 cycles.   PET scan on 05/09/2014 revealed marked progression of disease with multifocal masses in chest wall (left > right). She did not receive Imbrance in 04/2014. CA27.29  has increased: 22.9 on 01/08/2014, 54 on 03/05/2014, 170.8 on 05/06/2014, 250.8 on 05/23/2014, 445.8 on 06/14/2014, and 531.1 on 07/01/2104.   She received 3 cycles of Xeloda (02/12 - 07/05/2014). Chest and  abdomen CT scan on 07/05/2014 revealed persistent and slightly progressive diffuse locally invasive chest wall tumor. Bone scan on 07/25/2014 revealed no evidence of metastatic disease.   She is status post 8 cycles of Halaven (08/06/2014 - 01/07/2015) on ACCRU [id number]. Cycle #1 was notable for an ANC of 200 on day 15. Cycle #2 was notable for grade II fatigue, brief grade I diarrhea, and decreased appetite.  Cycle #3 was notable for grade I fatigue, anorexia, and weight loss.  Cycle #4 and #5 were notable for fatigue/lack of stamina, ongoing weight loss, and transient cold neuropathy in her feet.  Cycle #6 was notable for the development of a grade II neuropathy.  Chest, abdomen, and pelvic CT scan on 10/24/2014 revealed improved multifocal inflammatory left breast cancer.  The dominant 2.6 x 3.5 cm lesion in the left upper outer left breast has decreased to 1.7 x 3.5 cm. The anterior sternal lesion has decreased from 2 x 4 cm to 1.5 x 3.9 cm. There were no suspicious mediastinal, hilar or axillary adenopathy.  Chest, abdomen, and pelvic CT scan on 01/20/2015 revealed inflammatory left breast cancer with new necrotic appearing subcutaneous nodules in the lateral left breast and lateral left chest wall. CA27.29 was 339.8 on 08/06/2014, 51.7 on 10/15/2014, 37.6 on 12/10/2014, 26.4 on 01/07/2015, and 34.1 today.   Symptomatically, she has more fatigue.  Her grade II neuropathy is stable.  She has no appetite.  Weight is down 4 pounds.    Plan: 1.  Labs today: CBC with diff, CMP, CA27.29. 2.  Review scans with documented disease progression.  Discuss discontinuation of Halaven.  Discuss treatment options.  Discuss Taxol and Ixabepilone.  Side effects were reviewed.  Information was provided.   Discuss issue with worsening neuropathy. 3.  Medical photographs. 4.  RTC on 02/11/2015 for MD assessment, labs (CBC with diff, CMP, Mg), and initiation of weekly Taxol.   Lequita Asal, MD  01/28/2015, 4:56 PM

## 2015-01-29 LAB — CANCER ANTIGEN 27.29: CA 27.29: 34.1 U/mL (ref 0.0–38.6)

## 2015-02-04 ENCOUNTER — Other Ambulatory Visit: Payer: Self-pay | Admitting: Hematology and Oncology

## 2015-02-04 ENCOUNTER — Other Ambulatory Visit: Payer: Self-pay

## 2015-02-04 DIAGNOSIS — C50912 Malignant neoplasm of unspecified site of left female breast: Secondary | ICD-10-CM

## 2015-02-10 ENCOUNTER — Encounter: Payer: Self-pay | Admitting: Hematology and Oncology

## 2015-02-10 ENCOUNTER — Inpatient Hospital Stay: Payer: Medicare Other

## 2015-02-10 ENCOUNTER — Inpatient Hospital Stay (HOSPITAL_BASED_OUTPATIENT_CLINIC_OR_DEPARTMENT_OTHER): Payer: Medicare Other | Admitting: Hematology and Oncology

## 2015-02-10 VITALS — BP 117/75 | HR 90 | Temp 96.8°F | Resp 20

## 2015-02-10 VITALS — Ht 67.5 in | Wt 167.3 lb

## 2015-02-10 DIAGNOSIS — R5383 Other fatigue: Secondary | ICD-10-CM | POA: Diagnosis not present

## 2015-02-10 DIAGNOSIS — C50912 Malignant neoplasm of unspecified site of left female breast: Secondary | ICD-10-CM

## 2015-02-10 DIAGNOSIS — Z5111 Encounter for antineoplastic chemotherapy: Secondary | ICD-10-CM | POA: Diagnosis not present

## 2015-02-10 DIAGNOSIS — F17210 Nicotine dependence, cigarettes, uncomplicated: Secondary | ICD-10-CM

## 2015-02-10 DIAGNOSIS — Z79899 Other long term (current) drug therapy: Secondary | ICD-10-CM

## 2015-02-10 DIAGNOSIS — Z171 Estrogen receptor negative status [ER-]: Secondary | ICD-10-CM | POA: Diagnosis not present

## 2015-02-10 DIAGNOSIS — G629 Polyneuropathy, unspecified: Secondary | ICD-10-CM | POA: Diagnosis not present

## 2015-02-10 DIAGNOSIS — Z853 Personal history of malignant neoplasm of breast: Secondary | ICD-10-CM

## 2015-02-10 LAB — COMPREHENSIVE METABOLIC PANEL
ALT: 23 U/L (ref 14–54)
AST: 29 U/L (ref 15–41)
Albumin: 4 g/dL (ref 3.5–5.0)
Alkaline Phosphatase: 91 U/L (ref 38–126)
Anion gap: 6 (ref 5–15)
BUN: 20 mg/dL (ref 6–20)
CO2: 25 mmol/L (ref 22–32)
Calcium: 8.6 mg/dL — ABNORMAL LOW (ref 8.9–10.3)
Chloride: 107 mmol/L (ref 101–111)
Creatinine, Ser: 0.88 mg/dL (ref 0.44–1.00)
GFR calc Af Amer: >60 mL/min (ref >60)
GFR calc non Af Amer: >60 mL/min (ref >60)
Glucose, Bld: 101 mg/dL — ABNORMAL HIGH (ref 65–99)
Potassium: 3.7 mmol/L (ref 3.5–5.1)
Sodium: 138 mmol/L (ref 135–145)
Total Bilirubin: 0.6 mg/dL (ref 0.3–1.2)
Total Protein: 7.7 g/dL (ref 6.5–8.1)

## 2015-02-10 LAB — MAGNESIUM: Magnesium: 2.2 mg/dL (ref 1.7–2.4)

## 2015-02-10 LAB — CBC WITH DIFFERENTIAL/PLATELET
Basophils Absolute: 0 10*3/uL (ref 0–0.1)
Basophils Relative: 1 %
Eosinophils Absolute: 0.1 10*3/uL (ref 0–0.7)
Eosinophils Relative: 2 %
HCT: 38.6 % (ref 35.0–47.0)
Hemoglobin: 12.8 g/dL (ref 12.0–16.0)
Lymphocytes Relative: 34 %
Lymphs Abs: 1.4 10*3/uL (ref 1.0–3.6)
MCH: 28.5 pg (ref 26.0–34.0)
MCHC: 33.3 g/dL (ref 32.0–36.0)
MCV: 85.4 fL (ref 80.0–100.0)
Monocytes Absolute: 0.5 10*3/uL (ref 0.2–0.9)
Monocytes Relative: 13 %
Neutro Abs: 2 10*3/uL (ref 1.4–6.5)
Neutrophils Relative %: 50 %
Platelets: 166 10*3/uL (ref 150–440)
RBC: 4.51 MIL/uL (ref 3.80–5.20)
RDW: 19.8 % — ABNORMAL HIGH (ref 11.5–14.5)
WBC: 4 10*3/uL (ref 4.0–10.5)

## 2015-02-10 MED ORDER — PACLITAXEL CHEMO INJECTION 300 MG/50ML
80.0000 mg/m2 | Freq: Once | INTRAVENOUS | Status: AC
  Administered 2015-02-10: 150 mg via INTRAVENOUS
  Filled 2015-02-10: qty 25

## 2015-02-10 MED ORDER — DIPHENHYDRAMINE HCL 50 MG/ML IJ SOLN
50.0000 mg | Freq: Once | INTRAMUSCULAR | Status: AC
  Administered 2015-02-10: 50 mg via INTRAVENOUS
  Filled 2015-02-10: qty 1

## 2015-02-10 MED ORDER — SODIUM CHLORIDE 0.9 % IJ SOLN
10.0000 mL | INTRAMUSCULAR | Status: DC | PRN
  Administered 2015-02-10: 10 mL
  Filled 2015-02-10: qty 10

## 2015-02-10 MED ORDER — HEPARIN SOD (PORK) LOCK FLUSH 100 UNIT/ML IV SOLN
500.0000 [IU] | Freq: Once | INTRAVENOUS | Status: AC | PRN
  Administered 2015-02-10: 500 [IU]
  Filled 2015-02-10: qty 5

## 2015-02-10 MED ORDER — SODIUM CHLORIDE 0.9 % IV SOLN
Freq: Once | INTRAVENOUS | Status: AC
  Administered 2015-02-10: 11:00:00 via INTRAVENOUS
  Filled 2015-02-10: qty 4

## 2015-02-10 MED ORDER — SODIUM CHLORIDE 0.9 % IV SOLN
Freq: Once | INTRAVENOUS | Status: AC
  Administered 2015-02-10: 11:00:00 via INTRAVENOUS
  Filled 2015-02-10: qty 1000

## 2015-02-10 MED ORDER — FAMOTIDINE IN NACL 20-0.9 MG/50ML-% IV SOLN
20.0000 mg | Freq: Once | INTRAVENOUS | Status: AC
  Administered 2015-02-10: 20 mg via INTRAVENOUS
  Filled 2015-02-10: qty 50

## 2015-02-10 NOTE — Progress Notes (Signed)
[facility]-  Cancer Center  Clinic day:  02/10/2015  Chief Complaint: [name] [name] is a 63 y.o. female with recurrent breast cancer who is seen for assessment prior to initiation of weekly Taxol.  HPI: The patient was last seen in the medical oncology clinic on 01/28/2015.  At that time, restaging studies revealed progressive disease.  Halaven was discontinued.  We discussed treatment options.  As the patient had previously received AC, Taxotere and Cytoxan, Femara and Ibrance, Xeloda, and Halaven, we discussed Taxol versus Ixabepilone.  Other options included Abraxane, Navelbine, gemcitabine, and Doxil. We discussed issues regarding her neuropathy.  Decision was made to proceed cautiously with weekly Taxol.  During the interim, she has done well. Her neuropathy has improved. She notes a little tingling in her hands. Her feet feel better. Her appetite is coming back.  Past Medical History  Diagnosis Date  . Personal history of malignant neoplasm of breast   . Anemia   . Blood transfusion without reported diagnosis   . Emphysema of lung ([facility])   . Breast cancer ([facility])     Past Surgical History  Procedure Laterality Date  . Oophrectomy  1980  . Abdominal hysterectomy  1995  . Breast surgery Right 1996    mastectomy  . Breast biopsy Left 2014  . Breast biopsy Left 01-21-14    Family History  Problem Relation Age of Onset  . Cancer Father   . Heart disease Sister   . Diabetes Sister   . Heart disease Brother   . Cancer Paternal Grandmother     breast    Social History:  reports that she has been smoking Cigarettes.  She has a 6.25 pack-year smoking history. She has never used smokeless tobacco. She reports that she does not drink alcohol or use illicit drugs.  She did not travel to [place] last week.  She is alone today.  Allergies:  Allergies  Allergen Reactions  . Sulfa Antibiotics Nausea And Vomiting    headache    Current Medications: Current  Outpatient Prescriptions  Medication Sig Dispense Refill  . Calcium Carbonate-Vitamin D (CALCIUM + D PO) Take 2 tablets by mouth daily.    . KLOR-CON 10 10 MEQ tablet TAKE 1 TABLET (10 MEQ TOTAL) BY MOUTH DAILY. 30 tablet 1  . LORazepam (ATIVAN) 0.5 MG tablet   0  . traMADol (ULTRAM) 50 MG tablet Take 1 tablet (50 mg total) by mouth every 8 (eight) hours as needed. 90 tablet 0   No current facility-administered medications for this visit.   Facility-Administered Medications Ordered in Other Visits  Medication Dose Route Frequency Provider Last Rate Last Dose  . sodium chloride 0.9 % injection 10 mL  10 mL Intravenous PRN Melissa C Corcoran, MD   10 mL at 10/08/14 1335  . sodium chloride 0.9 % injection 10 mL  10 mL Intracatheter PRN Melissa C Corcoran, MD   10 mL at 11/26/14 1355    Review of Systems:  GENERAL:  Feels better.  No fevers or sweats.  Weight down 1 pound. PERFORMANCE STATUS (ECOG):  1 HEENT:  No visual changes, runny nose, sore throat, mouth sores or tenderness. Lungs: No shortness of breath or cough.  No hemoptysis. Cardiac:  No chest pain, palpitations, orthopnea, or PND. GI:  Appetite improved.  No nausea, vomiting, diarrhea, constipation, melena or hematochezia. GU:  No urgency, frequency, dysuria, or hematuria. Musculoskeletal:  No back pain.  No joint pain.  No muscle tenderness. Extremities:    No pain or swelling. Skin:  Hair thin.  No rashes or skin changes. Neuro:  Neuropathy in fee, bettert.  Little tingling in hands.  No headache, numbness or weakness, or coordination issues. Endocrine:  No diabetes, thyroid issues, hot flashes or night sweats. Psych:  No mood changes, depression or anxiety. Pain: No complaints. Review of systems:  All other systems reviewed and found to be negative.   Physical Exam: Height 5' 7.5" (1.715 m), weight 167 lb 5.3 oz (75.9 kg).  GENERAL: Well developed, well nourished, sitting comfortably in the exam room in no acute  distress. MENTAL STATUS: Alert and oriented to person, place and time. HEAD: Short brown wig. Normocephalic, atraumatic, face symmetric, no Cushingoid features. EYES: Brown eyes. Pupils equal round and reactive to light and accomodation. No conjunctivitis or scleral icterus. ENT: Oropharynx clear without lesion. Tongue normal. Mucous membranes moist.  NECK:  Left neck cyst (stable). RESPIRATORY: Clear to auscultation without rales, wheezes or rhonchi. CARDIOVASCULAR: Regular rate and rhythm without murmur, rub or gallop. BREAST: Right chest wall lesions healed. Central upper sternal area flat.  Left 1.5 cm axillary nodule.  Left lateral denuded area triangular in shape (2.5 x 1-1.5 cm). ABDOMEN: Soft, non-tender, with active bowel sounds, and no hepatosplenomegaly. No masses. SKIN: Cutaneous breast cancer noted in breast section. No other lesions.  Long nails. EXTREMITIES: No edema, no skin discoloration or tenderness. No palpable cords. LYMPH NODES: Left axillary ridge (stbale). No palpable cervical, supraclavicular, axillary or inguinal adenopathy  NEUROLOGICAL: Unremarkable. PSYCH: Appropriate.  Infusion on 02/10/2015  Component Date Value Ref Range Status  . WBC 02/10/2015 4.0  4.0 - 10.5 K/uL Final   A-LINE DRAW  . RBC 02/10/2015 4.51  3.80 - 5.20 MIL/uL Final  . Hemoglobin 02/10/2015 12.8  12.0 - 16.0 g/dL Final  . HCT 02/10/2015 38.6  35.0 - 47.0 % Final  . MCV 02/10/2015 85.4  80.0 - 100.0 fL Final  . MCH 02/10/2015 28.5  26.0 - 34.0 pg Final  . MCHC 02/10/2015 33.3  32.0 - 36.0 g/dL Final  . RDW 02/10/2015 19.8* 11.5 - 14.5 % Final  . Platelets 02/10/2015 166  150 - 440 K/uL Final  . Neutrophils Relative % 02/10/2015 50   Final  . Neutro Abs 02/10/2015 2.0  1.4 - 6.5 K/uL Final  . Lymphocytes Relative 02/10/2015 34   Final  . Lymphs Abs 02/10/2015 1.4  1.0 - 3.6 K/uL Final  . Monocytes Relative 02/10/2015 13   Final  . Monocytes Absolute 02/10/2015 0.5   0.2 - 0.9 K/uL Final  . Eosinophils Relative 02/10/2015 2   Final  . Eosinophils Absolute 02/10/2015 0.1  0 - 0.7 K/uL Final  . Basophils Relative 02/10/2015 1   Final  . Basophils Absolute 02/10/2015 0.0  0 - 0.1 K/uL Final  . Sodium 02/10/2015 138  135 - 145 mmol/L Final  . Potassium 02/10/2015 3.7  3.5 - 5.1 mmol/L Final  . Chloride 02/10/2015 107  101 - 111 mmol/L Final  . CO2 02/10/2015 25  22 - 32 mmol/L Final  . Glucose, Bld 02/10/2015 101* 65 - 99 mg/dL Final  . BUN 02/10/2015 20  6 - 20 mg/dL Final  . Creatinine, Ser 02/10/2015 0.88  0.44 - 1.00 mg/dL Final  . Calcium 02/10/2015 8.6* 8.9 - 10.3 mg/dL Final  . Total Protein 02/10/2015 7.7  6.5 - 8.1 g/dL Final  . Albumin 02/10/2015 4.0  3.5 - 5.0 g/dL Final  . AST 02/10/2015 29  15 -   41 U/L Final  . ALT 02/10/2015 23  14 - 54 U/L Final  . Alkaline Phosphatase 02/10/2015 91  38 - 126 U/L Final  . Total Bilirubin 02/10/2015 0.6  0.3 - 1.2 mg/dL Final  . GFR calc non Af Amer 02/10/2015 >60  >60 mL/min Final  . GFR calc Af Amer 02/10/2015 >60  >60 mL/min Final   Comment: (NOTE) The eGFR has been calculated using the CKD EPI equation. This calculation has not been validated in all clinical situations. eGFR's persistently <60 mL/min signify possible Chronic Kidney Disease.   . Anion gap 02/10/2015 6  5 - 15 Final  . Magnesium 02/10/2015 2.2  1.7 - 2.4 mg/dL Final    Assessment:  [name] [name] is a 63 y.o. African American woman with a history of stage I right breast cancer s/p modified radical mastectomy on 06/12/1996. Pathology revealed a grade III mutifocal infiltrating ductal carcinoma (tumor size 8.5 cm with an invasive component 1.6 cm). Twenty-one lymph nodes were negative. Tumor was ER/PR negative. She received 4 cycles of AC at [facility].  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 PET scan on 05/22/2012 revealed  multiple areas of disease. She received 6 cycles of Taxotere and Cytoxan (TC) from 06/06/2012 - 09/29/2012. PET scan on 09/07/2012 noted marked improvement. Post chemotherapy biopsy was positive. She was not a surgical candidate. She was placed on Femara.  PET scan on 05/03/2013 revealed a new focus along lateral left breast and a new level II cervical lymph node. She received radiation from 09/13/2013 - 11/05/2013. PET scan on 01/01/2014 revealed new nodule medial left chest wall pleural/paraspinal activity LUL. Multiple biopsies on 01/19/2014 were positive. She began daily letrozole and Ibrance on 02/12/2014. Despite treatment, new nodules formed. She received a total of 2 cycles.   PET scan on 05/09/2014 revealed marked progression of disease with multifocal masses in chest wall (left > right). She did not receive Imbrance in 04/2014. CA27.29 has increased: 22.9 on 01/08/2014, 54 on 03/05/2014, 170.8 on 05/06/2014, 250.8 on 05/23/2014, 445.8 on 06/14/2014, and 531.1 on 07/01/2104.   She received 3 cycles of Xeloda (02/12 - 07/05/2014). Chest and abdomen CT scan on 07/05/2014 revealed persistent and slightly progressive diffuse locally invasive chest wall tumor. Bone scan on 07/25/2014 revealed no evidence of metastatic disease.   She received 8 cycles of Halaven (08/06/2014 - 01/07/2015) on ACCRU [id number]. 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  Chest, abdomen, and pelvic CT scan on 10/24/2014 revealed improved multifocal inflammatory left breast cancer.  The dominant 2.6 x 3.5 cm lesion in the left upper outer left breast has decreased to 1.7 x 3.5 cm. The anterior sternal lesion has decreased from 2 x 4 cm to 1.5 x 3.9 cm. There were no suspicious mediastinal, hilar or axillary adenopathy.  Chest, abdomen, and pelvic CT scan on 01/20/2015 revealed inflammatory left breast cancer with new necrotic appearing subcutaneous nodules in the lateral left breast and lateral  left chest wall. CA27.29 was 339.8 on 08/06/2014, 51.7 on 10/15/2014, 37.6 on 12/10/2014, 26.4 on 01/07/2015, and 34.1 on 01/28/2015.   Symptomatically, she has a little tingling in her hands.  Her feet feel better.  Her appetite is improving.  Weight is down 1 pound.    Plan: 1.  Labs today: CBC with diff, CMP, Mg. 2.  Review plan for weekly Taxol 3 weeks on/1week off depending on tolerance.  Side   effects were reviewed.  Discuss issue with worsening neuropathy.  Discuss possible switch to weekly Abraxane for worsening neuropathy.  Patient consented to treatment. 3. RTC in 1 week for MD assessment, labs (CBC with diff, CMP), and week #2 Taxol.   Melissa C Corcoran, MD  02/10/2015, 8:52 AM   

## 2015-02-17 ENCOUNTER — Encounter: Payer: Medicare Other | Attending: Surgery | Admitting: Surgery

## 2015-02-17 ENCOUNTER — Other Ambulatory Visit: Payer: Self-pay

## 2015-02-17 ENCOUNTER — Inpatient Hospital Stay: Payer: Medicare Other | Attending: Hematology and Oncology | Admitting: Hematology and Oncology

## 2015-02-17 ENCOUNTER — Inpatient Hospital Stay: Payer: Medicare Other

## 2015-02-17 VITALS — BP 121/83 | HR 78 | Resp 20

## 2015-02-17 VITALS — BP 118/79 | HR 77 | Temp 95.6°F | Resp 18 | Ht 67.5 in | Wt 164.9 lb

## 2015-02-17 DIAGNOSIS — C50412 Malignant neoplasm of upper-outer quadrant of left female breast: Secondary | ICD-10-CM | POA: Insufficient documentation

## 2015-02-17 DIAGNOSIS — Z170 Estrogen receptor positive status [ER+]: Secondary | ICD-10-CM | POA: Insufficient documentation

## 2015-02-17 DIAGNOSIS — Z5111 Encounter for antineoplastic chemotherapy: Secondary | ICD-10-CM | POA: Insufficient documentation

## 2015-02-17 DIAGNOSIS — C50812 Malignant neoplasm of overlapping sites of left female breast: Secondary | ICD-10-CM | POA: Insufficient documentation

## 2015-02-17 DIAGNOSIS — X58XXXD Exposure to other specified factors, subsequent encounter: Secondary | ICD-10-CM | POA: Insufficient documentation

## 2015-02-17 DIAGNOSIS — C50912 Malignant neoplasm of unspecified site of left female breast: Secondary | ICD-10-CM | POA: Insufficient documentation

## 2015-02-17 DIAGNOSIS — S21002D Unspecified open wound of left breast, subsequent encounter: Secondary | ICD-10-CM | POA: Insufficient documentation

## 2015-02-17 DIAGNOSIS — Z79899 Other long term (current) drug therapy: Secondary | ICD-10-CM | POA: Insufficient documentation

## 2015-02-17 DIAGNOSIS — Z853 Personal history of malignant neoplasm of breast: Secondary | ICD-10-CM | POA: Diagnosis not present

## 2015-02-17 DIAGNOSIS — G629 Polyneuropathy, unspecified: Secondary | ICD-10-CM | POA: Insufficient documentation

## 2015-02-17 DIAGNOSIS — Z79811 Long term (current) use of aromatase inhibitors: Secondary | ICD-10-CM

## 2015-02-17 DIAGNOSIS — F17210 Nicotine dependence, cigarettes, uncomplicated: Secondary | ICD-10-CM | POA: Diagnosis not present

## 2015-02-17 DIAGNOSIS — S21001D Unspecified open wound of right breast, subsequent encounter: Secondary | ICD-10-CM | POA: Insufficient documentation

## 2015-02-17 LAB — COMPREHENSIVE METABOLIC PANEL
ALT: 32 U/L (ref 14–54)
AST: 29 U/L (ref 15–41)
Albumin: 3.8 g/dL (ref 3.5–5.0)
Alkaline Phosphatase: 99 U/L (ref 38–126)
Anion gap: 5 (ref 5–15)
BUN: 15 mg/dL (ref 6–20)
CO2: 25 mmol/L (ref 22–32)
Calcium: 9 mg/dL (ref 8.9–10.3)
Chloride: 106 mmol/L (ref 101–111)
Creatinine, Ser: 0.72 mg/dL (ref 0.44–1.00)
GFR calc Af Amer: >60 mL/min (ref >60)
GFR calc non Af Amer: >60 mL/min (ref >60)
Glucose, Bld: 98 mg/dL (ref 65–99)
Potassium: 3.7 mmol/L (ref 3.5–5.1)
Sodium: 136 mmol/L (ref 135–145)
Total Bilirubin: 0.5 mg/dL (ref 0.3–1.2)
Total Protein: 7.6 g/dL (ref 6.5–8.1)

## 2015-02-17 LAB — CBC WITH DIFFERENTIAL/PLATELET
Basophils Absolute: 0 10*3/uL (ref 0–0.1)
Basophils Relative: 1 %
Eosinophils Absolute: 0.1 10*3/uL (ref 0–0.7)
Eosinophils Relative: 2 %
HCT: 38.7 % (ref 35.0–47.0)
Hemoglobin: 12.9 g/dL (ref 12.0–16.0)
Lymphocytes Relative: 34 %
Lymphs Abs: 0.9 10*3/uL — ABNORMAL LOW (ref 1.0–3.6)
MCH: 28.6 pg (ref 26.0–34.0)
MCHC: 33.3 g/dL (ref 32.0–36.0)
MCV: 85.9 fL (ref 80.0–100.0)
Monocytes Absolute: 0.2 10*3/uL (ref 0.2–0.9)
Monocytes Relative: 8 %
Neutro Abs: 1.5 10*3/uL (ref 1.4–6.5)
Neutrophils Relative %: 55 %
Platelets: 196 10*3/uL (ref 150–440)
RBC: 4.5 MIL/uL (ref 3.80–5.20)
RDW: 19 % — ABNORMAL HIGH (ref 11.5–14.5)
WBC: 2.8 10*3/uL — ABNORMAL LOW (ref 3.6–11.0)

## 2015-02-17 LAB — MAGNESIUM: Magnesium: 2.2 mg/dL (ref 1.7–2.4)

## 2015-02-17 MED ORDER — HEPARIN SOD (PORK) LOCK FLUSH 100 UNIT/ML IV SOLN
500.0000 [IU] | Freq: Once | INTRAVENOUS | Status: AC | PRN
  Administered 2015-02-17: 500 [IU]
  Filled 2015-02-17: qty 5

## 2015-02-17 MED ORDER — DIPHENHYDRAMINE HCL 50 MG/ML IJ SOLN
50.0000 mg | Freq: Once | INTRAMUSCULAR | Status: AC
  Administered 2015-02-17: 50 mg via INTRAVENOUS
  Filled 2015-02-17: qty 1

## 2015-02-17 MED ORDER — PACLITAXEL CHEMO INJECTION 300 MG/50ML
80.0000 mg/m2 | Freq: Once | INTRAVENOUS | Status: AC
  Administered 2015-02-17: 150 mg via INTRAVENOUS
  Filled 2015-02-17: qty 25

## 2015-02-17 MED ORDER — FAMOTIDINE IN NACL 20-0.9 MG/50ML-% IV SOLN
20.0000 mg | Freq: Once | INTRAVENOUS | Status: AC
  Administered 2015-02-17: 20 mg via INTRAVENOUS
  Filled 2015-02-17: qty 50

## 2015-02-17 MED ORDER — SODIUM CHLORIDE 0.9 % IV SOLN
Freq: Once | INTRAVENOUS | Status: AC
  Administered 2015-02-17: 13:00:00 via INTRAVENOUS
  Filled 2015-02-17: qty 1000

## 2015-02-17 MED ORDER — DEXAMETHASONE SODIUM PHOSPHATE 100 MG/10ML IJ SOLN
Freq: Once | INTRAMUSCULAR | Status: AC
  Administered 2015-02-17: 13:00:00 via INTRAVENOUS
  Filled 2015-02-17: qty 4

## 2015-02-17 NOTE — Progress Notes (Signed)
[facility] day:  02/17/2015   Chief Complaint: [name] [name] is a 64 y.o. female with recurrent breast cancer who is seen for assessment prior to week #2 Taxol.  HPI: The patient was last seen in the medical oncology clinic on 02/10/2015.  At that time, she began weekly Taxol.  She states that her chemotherapy went great. 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 Her feet feel better than last week.  Past Medical History  Diagnosis Date  . Personal history of malignant neoplasm of breast   . Anemia   . Blood transfusion without reported diagnosis   . Emphysema of lung ([place])   . Breast cancer [facility])     Past Surgical History  Procedure Laterality Date  . Oophrectomy  1980  . Abdominal hysterectomy  1995  . Breast surgery Right 1996    mastectomy  . Breast biopsy Left 2014  . Breast biopsy Left 01-21-14    Family History  Problem Relation Age of Onset  . Cancer Father   . Heart disease Sister   . Diabetes Sister   . Heart disease Brother   . Cancer Paternal Grandmother     breast    Social History:  reports that she has been smoking Cigarettes.  She has a 6.25 pack-year smoking history. She has never used smokeless tobacco. She reports that she does not drink alcohol or use illicit drugs.  She is alone today.  Allergies:  Allergies  Allergen Reactions  . Sulfa Antibiotics Nausea And Vomiting    headache    Current Medications: Current Outpatient Prescriptions  Medication Sig Dispense Refill  . Calcium Carbonate-Vitamin D (CALCIUM + D PO) Take 2 tablets by mouth daily.    Marland Kitchen KLOR-CON 10 10 MEQ tablet TAKE 1 TABLET (10 MEQ TOTAL) BY MOUTH DAILY. 30 tablet 1  . LORazepam (ATIVAN) 0.5 MG tablet   0  . traMADol (ULTRAM) 50 MG tablet Take 1 tablet (50 mg total) by mouth every 8 (eight) hours as needed. 90 tablet 0   No current facility-administered medications for this visit.   Facility-Administered Medications Ordered in  Other Visits  Medication Dose Route Frequency Provider Last Rate Last Dose  . sodium chloride 0.9 % injection 10 mL  10 mL Intravenous PRN Lequita Asal, MD   10 mL at 10/08/14 1335  . sodium chloride 0.9 % injection 10 mL  10 mL Intracatheter PRN Lequita Asal, MD   10 mL at 11/26/14 1355    Review of Systems:  GENERAL:  Feels good.  No fevers or sweats.  Weight down 3 pounds. PERFORMANCE STATUS (ECOG):  1 HEENT:  No visual changes, runny nose, sore throat, mouth sores or tenderness. Lungs: No shortness of breath or cough.  No hemoptysis. Cardiac:  No chest pain, palpitations, orthopnea, or PND. GI:  No nausea, vomiting, diarrhea, constipation, melena or hematochezia. GU:  No urgency, frequency, dysuria, or hematuria. Musculoskeletal:  No back pain.  No joint pain.  No muscle tenderness. Extremities:  No pain or swelling. Skin:  Hair thin.  No rashes or skin changes. Neuro:  Neuropathy in feet, better.  No headache, numbness or weakness, or coordination issues. Endocrine:  No diabetes, thyroid issues, hot flashes or night sweats. Psych:  No mood changes, depression or anxiety. Pain: No complaints. Review of systems:  All other systems reviewed and found to be negative.   Physical Exam: Blood pressure 118/79, pulse 77, temperature 95.6  F (35.3 C), temperature source Tympanic, resp. rate 18, height 5' 7.5" (1.715 m), weight 164 lb 14.5 oz (74.8 kg).  GENERAL: Well developed, well nourished, sitting comfortably in the exam room in no acute distress. MENTAL STATUS: Alert and oriented to person, place and time. HEAD: Short brown wig. Normocephalic, atraumatic, face symmetric, no Cushingoid features. EYES: Brown eyes. Pupils equal round and reactive to light and accomodation. No conjunctivitis or scleral icterus. ENT: Oropharynx clear without lesion. Tongue normal. Mucous membranes moist.  NECK:  Left neck cyst (stable). RESPIRATORY: Clear to auscultation without  rales, wheezes or rhonchi. CARDIOVASCULAR: Regular rate and rhythm without murmur, rub or gallop. BREAST: Right chest wall lesions healed. Central upper sternal area flat.  Left 1.5 cm axillary nodule.  Left lateral denuded area triangular in shape (2.5 x 1-1.5 cm). ABDOMEN: Soft, non-tender, with active bowel sounds, and no hepatosplenomegaly. No masses. SKIN: Cutaneous breast cancer noted in breast section. No other lesions.  Long nails. EXTREMITIES: No edema, no skin discoloration or tenderness. No palpable cords. LYMPH NODES: Left axillary ridge (stbale). No palpable cervical, supraclavicular, axillary or inguinal adenopathy  NEUROLOGICAL: Unremarkable. PSYCH: Appropriate.  Infusion on 02/17/2015  Component Date Value Ref Range Status  . WBC 02/17/2015 2.8* 3.6 - 11.0 K/uL Final  . RBC 02/17/2015 4.50  3.80 - 5.20 MIL/uL Final  . Hemoglobin 02/17/2015 12.9  12.0 - 16.0 g/dL Final  . HCT 02/17/2015 38.7  35.0 - 47.0 % Final  . MCV 02/17/2015 85.9  80.0 - 100.0 fL Final  . MCH 02/17/2015 28.6  26.0 - 34.0 pg Final  . MCHC 02/17/2015 33.3  32.0 - 36.0 g/dL Final  . RDW 02/17/2015 19.0* 11.5 - 14.5 % Final  . Platelets 02/17/2015 196  150 - 440 K/uL Final  . Neutrophils Relative % 02/17/2015 55   Final  . Neutro Abs 02/17/2015 1.5  1.4 - 6.5 K/uL Final  . Lymphocytes Relative 02/17/2015 34   Final  . Lymphs Abs 02/17/2015 0.9* 1.0 - 3.6 K/uL Final  . Monocytes Relative 02/17/2015 8   Final  . Monocytes Absolute 02/17/2015 0.2  0.2 - 0.9 K/uL Final  . Eosinophils Relative 02/17/2015 2   Final  . Eosinophils Absolute 02/17/2015 0.1  0 - 0.7 K/uL Final  . Basophils Relative 02/17/2015 1   Final  . Basophils Absolute 02/17/2015 0.0  0 - 0.1 K/uL Final  . Sodium 02/17/2015 136  135 - 145 mmol/L Final  . Potassium 02/17/2015 3.7  3.5 - 5.1 mmol/L Final  . Chloride 02/17/2015 106  101 - 111 mmol/L Final  . CO2 02/17/2015 25  22 - 32 mmol/L Final  . Glucose, Bld 02/17/2015 98  65  - 99 mg/dL Final  . BUN 02/17/2015 15  6 - 20 mg/dL Final  . Creatinine, Ser 02/17/2015 0.72  0.44 - 1.00 mg/dL Final  . Calcium 02/17/2015 9.0  8.9 - 10.3 mg/dL Final  . Total Protein 02/17/2015 7.6  6.5 - 8.1 g/dL Final  . Albumin 02/17/2015 3.8  3.5 - 5.0 g/dL Final  . AST 02/17/2015 29  15 - 41 U/L Final  . ALT 02/17/2015 32  14 - 54 U/L Final  . Alkaline Phosphatase 02/17/2015 99  38 - 126 U/L Final  . Total Bilirubin 02/17/2015 0.5  0.3 - 1.2 mg/dL Final  . GFR calc non Af Amer 02/17/2015 >60  >60 mL/min Final  . GFR calc Af Amer 02/17/2015 >60  >60 mL/min Final   Comment: (NOTE) The  eGFR has been calculated using the CKD EPI equation. This calculation has not been validated in all clinical situations. eGFR's persistently <60 mL/min signify possible Chronic Kidney Disease.   . Anion gap 02/17/2015 5  5 - 15 Final  . Magnesium 02/17/2015 2.2  1.7 - 2.4 mg/dL Final    Assessment:  [name] [name] is a 64 y.o. African American woman with a history of stage I right breast cancer s/p modified radical mastectomy on 06/12/1996. Pathology revealed a grade III mutifocal infiltrating ductal carcinoma (tumor size 8.5 cm with an invasive component 1.6 cm). Twenty-one lymph nodes were negative. Tumor was ER/PR negative. She received 4 cycles of AC at [facility].  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 PET scan on 05/22/2012 revealed multiple areas of disease. She received 6 cycles of Taxotere and Cytoxan (TC) from 06/06/2012 - 09/29/2012. PET scan on 09/07/2012 noted marked improvement. Post chemotherapy biopsy was positive. She was not a surgical candidate. She was placed on Femara.  PET scan on 05/03/2013 revealed a new focus along lateral left breast and a new level II cervical lymph node. She received radiation from 09/13/2013 - 11/05/2013. PET scan on 01/01/2014 revealed  new nodule medial left chest wall pleural/paraspinal activity LUL. Multiple biopsies on 01/19/2014 were positive. She began daily letrozole and Ibrance on 02/12/2014. Despite treatment, new nodules formed. She received a total of 2 cycles.   PET scan on 05/09/2014 revealed marked progression of disease with multifocal masses in chest wall (left > right). She did not receive Imbrance in 04/2014. CA27.29 has increased: 22.9 on 01/08/2014, 54 on 03/05/2014, 170.8 on 05/06/2014, 250.8 on 05/23/2014, 445.8 on 06/14/2014, and 531.1 on 07/01/2104.   She received 3 cycles of Xeloda (02/12 - 07/05/2014). Chest and abdomen CT scan on 07/05/2014 revealed persistent and slightly progressive diffuse locally invasive chest wall tumor. Bone scan on 07/25/2014 revealed no evidence of metastatic disease.   She received 8 cycles of [place] (08/06/2014 - 01/07/2015) on ACCRU [id number] I. 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  Chest, abdomen, and pelvic CT scan on 10/24/2014 revealed improved multifocal inflammatory left breast cancer.  The dominant 2.6 x 3.5 cm lesion in the left upper outer left breast had decreased to 1.7 x 3.5 cm. The anterior sternal lesion has decreased from 2 x 4 cm to 1.5 x 3.9 cm. There were no suspicious mediastinal, hilar or axillary adenopathy.    Chest, abdomen, and pelvic CT scan on 01/20/2015 revealed inflammatory left breast cancer with new necrotic appearing subcutaneous nodules in the lateral left breast and lateral left chest wall. CA27.29 was 339.8 on 08/06/2014, 51.7 on 10/15/2014, 37.6 on 12/10/2014, 26.4 on 01/07/2015, and 34.1 on 01/28/2015.   She is s/p week 1 of cycle #1 Taxol (began 02/10/2015), 3 weeks on/1 week off.  Her neuropathy has improved.    Plan: 1.  Labs today: CBC with diff, CMP, Mg. 2.  Week 2 Taxol.. 3.  RTC in 1 week for labs (CBC with diff, CMP), and week 3 Taxol. 4.  RTC in 3 weeks for MD assess, labs (CBC with diff, CMP, CA27.29,  Mg), and week 1 of cycle #2 Taxol.   Lequita Asal, MD  02/17/2015, 11:35 AM

## 2015-02-17 NOTE — Progress Notes (Signed)
Patient is here for follow-up of inflammatory breast cancer. Patient states that she has been doing good has no complaints today.

## 2015-02-18 NOTE — Progress Notes (Signed)
[name] [name], [name] [name] ([id number]) Visit Report for 02/17/2015 Chief Complaint Document Details Patient Name: [name] [name] 02/17/2015 8:45 Date of Service: AM Medical Record [id number] Number: Patient Account Number: [id number] 1950-12-12 (64 y.o. 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 07/18/2014 -- since last week the patient has had [name] complaints and is on her last cycle of chemotherapy. I have reviewed 155 pages of her reports from a medical oncologist and the summary is made in the history of present illness. Electronic Signature(s) Signed: 02/17/2015 9:25:14 AM By: [name] [name] Entered By: [name] [name] 09:25:14 [name] [name] ([id number]) -------------------------------------------------------------------------------- HPI Details Patient Name: [name] [name] 02/17/2015 8:45 Date of Service: AM Medical Record [id number] Number: Patient Account Number: [id number] 1951-02-05 (64 y.o. Treating RN: [name] [name] Date of Birth/Sex: Female) Other Clinician: Primary Care Physician: [name] [name] Treating [name] Referring Physician: Physician/Extender: [name] [name] in Treatment: 31 History of Present Illness HPI Description: this 64 year old patient has come by herself today as a self-referral and has [name] documentation with her. She has had open wounds to her left chest and the right chest wall for about 2 months. She is not a diabetic and has [name] significant medical history except breast cancer. Her right breast cancer was diagnosed 18  years ago and she had a mastectomy and chemotherapy. 3 years ago she was then diagnosed with a breast cancer of the left side which was advanced and could not be operated and was given chemotherapy for a year and then followed by radiation. Her last radiation was over a year ago. She has not had any biopsy done recently from the ulcerated area but she says there have been other biopsies done from the chest wall and these reports are not available at the present time. She was told to keep the wounds open and let them dry out but she is finding it's more and more difficult to stop soiling her clothes if she leaves wounds open. She is on chemotherapy and we will try and obtain these notes from her oncologist. She is not in pain and does not have any significant problems except the discomfort from an open wound on the chest wall. 07/18/2014 This is a summary of [name] [name] date of birth [birth year]. These are obtained by reviewing 151 pages of medical records which were obtained from the [facility] 64 year old patient who had a history of stage I right breast cancer status post modified radical mastectomy on 06/12/1996. 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 She received 4 cycles of AC at [facility]. In January 2014 she presented with left breast swelling and apparently cellulitis. Breast biopsy done revealed lobular carcinoma which was ER and PR positive and HER-2/neu negative. On PET scan and she was shown to have multiple areas of disease left breast, axilla and supraclavicular areas. She received 6 cycles of chemotherapy and then received Femara because she was not a surgical candidate. PET scan also revealed disease in the left lateral breast and new cervical lymph nodes at level II. The patient was then sent for radiation  therapy and received this over a month. In January 2016 she had a PET CT scan done which was compatible  with marked progression of disease with multifocal soft tissue masses in the chest wall left greater than right. She is currently receiving Xeloda and is noted to have disease in the chest wall which is widely present. Her most recent lab work from 07/02/2014 shows that her CMP is within normal limits with a serum albumin of 4.4. The previous CBC was within normal limits with a WBC count of 4.3 hemoglobin of 14 hematocrit of 40.9 and platelets of 202. 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 We will continue with palliative care and make her comfortable as much as possible as far as her wound care goes. Present time there is [name] odor but if this does occur we would use carbon- based products to mask the odor. [name], [name] ([id number]) Notes were also reviewed from her surgeon Dr. Jeffrey Byrnett --closure on 05/27/2014 for breast biopsy in view of the fact that she had extensive disease on her chest wall. Biopsies are taken from the nodular metastatic disease overlying the sternum and nodules chosen for biopsy. Punch biopsies were taken and the pathology on this confirmed that this was consistent with carcinoma 08/15/2014 -- she is started on a new course of injectable chemotherapy and this is 2 weeks on and 2 weeks off. She has [name] new symptoms [name] bleeding from the wounds nor is there any odor. She is doing fine otherwise. 09/16/2014. She is doing very well and the right side wounds have completely healed. She has finished 2 cycles of chemotherapy and she is 2 weeks on and 2 weeks off. He is to restart her chemotherapy this week. [name] fresh complaints pain is within control and there is [name] odor from the wounds. 10/21/2014 -- overall she is doing very well and is on a clinical trial with her  medical oncologist. [name] issues with the wound on her left side and there is [name] odor or drainage. 11/25/2014 -- she continues to be under clinical trial protocol with the medical oncologist and has [name] fresh issues. [name] -- she has been taken off the clinical trial protocol and now is on Taxol on a regular basis with her medical oncologists Dr. Cochran. Electronic Signature(s) Signed: 02/17/2015 9:25:43 AM By: Britto, Errol [name], [name] Entered By: Britto, Errol on [name] 09:25:43 [name], [name] ([id number]) -------------------------------------------------------------------------------- Physical Exam Details Patient Name: [name], [name] 02/17/2015 8:45 Date of Service: AM Medical Record [id number] Number: Patient Account Number: [id number] 10/12/1950 (63 y.o. Treating RN: Dorthy, Joanna Date of Birth/Sex: Female) Other Clinician: Primary Care Physician: [name] [name] Treating Britto, Errol Referring Physician: Physician/Extender: Weeks in Treatment: 31 Constitutional . Pulse regular. Respirations normal and unlabored. Afebrile. . Eyes Nonicteric. Reactive to light. Ears, Nose, Mouth, and Throat Lips, teeth, and gums WNL.. Moist mucosa without lesions . Neck supple and nontender. [name] palpable supraclavicular or cervical adenopathy. Normal sized without goiter. Respiratory WNL. [name] retractions.. Breath sounds WNL, [name] rubs, rales, rhonchi, or wheeze.. Cardiovascular Heart rhythm and rate regular, [name] murmur or gallop.. Pedal Pulses WNL. [name] clubbing, cyanosis or edema. Chest . the right breast does not have any open ulcer and the chest wall has completely healed.the left chest wall does not have any   open ulcerations. The left breast continues to be distorted and has induration. The lateral part of her chest wall and axilla has a very tiny ulcer which is still open which bleeds minimally on cleaning with saline.. Gastrointestinal (GI) Abdomen without masses or tenderness.. [name]  liver or spleen enlargement or tenderness.. Lymphatic [name] adneopathy. [name] adenopathy. [name] adenopathy. Musculoskeletal Adexa without tenderness or enlargement.. Digits and nails w/o clubbing, cyanosis, infection, petechiae, ischemia, or inflammatory conditions.. Integumentary (Hair, Skin) [name] suspicious lesions. [name] crepitus or fluctuance. [name] peri-wound warmth or erythema. [name] masses.. Psychiatric Judgement and insight Intact.. [name] evidence of depression, anxiety, or agitation.. Notes The right breast does not have any open ulcer and the chest wall has completely healed. On the left breast she has an area which is indurated and on the lateral chest wall and axilla she has a very tiny ulcer which is still open which bleeds minimally on cleaning with saline. [name], [name] ([id number]) Electronic Signature(s) Signed: 02/17/2015 9:27:56 AM By: Britto, Errol [name], [name] Entered By: Britto, Errol on [name] 09:27:56 [name], [name] ([id number]) -------------------------------------------------------------------------------- Physician Orders Details Patient Name: [name], [name] 02/17/2015 8:45 Date of Service: AM Medical Record [id number] Number: Patient Account Number: [id number] 04/07/1951 (63 y.o. Treating RN: Dorthy, Joanna Date of Birth/Sex: Female) Other Clinician: Primary Care Physician: [name] [name] Treating Britto, Errol Referring Physician: Physician/Extender: Weeks in Treatment: 31 Verbal / Phone Orders: Yes Clinician: Dorthy, Joanna Read Back and Verified: Yes Diagnosis Coding Wound Cleansing Wound #1 Left Chest o Clean wound with Normal Saline. Anesthetic Wound #1 Left Chest o Topical Lidocaine 4% cream applied to wound bed prior to debridement Skin Barriers/Peri-Wound Care Wound #1 Left Chest o Skin Prep Primary Wound Dressing Wound #1 Left Chest o Aquacel Ag Secondary Dressing Wound #1 Left Chest o Boardered Foam Dressing Dressing Change  Frequency Wound #1 Left Chest o Change dressing every other day. - or more if needed Follow-up Appointments Wound #1 Left Chest o Return Appointment in 1 month Electronic Signature(s) Signed: 02/17/2015 4:02:36 PM By: Britto, Errol [name], [name] Signed: 02/17/2015 4:46:58 PM By: Dorthy, Joanna [name], [name] ([id number]) Entered By: Dorthy, Joanna on [name] 09:09:58 [name], [name] ([id number]) -------------------------------------------------------------------------------- Problem List Details Patient Name: [name], [name] 02/17/2015 8:45 Date of Service: AM Medical Record [id number] Number: Patient Account Number: [id number] 06/27/1950 (63 y.o. Treating RN: Dorthy, Joanna Date of Birth/Sex: Female) Other Clinician: Primary Care Physician: [name] [name] Treating Britto, Errol Referring Physician: Physician/Extender: Weeks in Treatment: 31 Active Problems ICD-10 Encounter Code Description Active Date Diagnosis C50.812 Malignant neoplasm of overlapping sites of left female 07/11/2014 Yes breast S21.001A Unspecified open wound of right breast, initial encounter 07/11/2014 Yes C50.412 Malignant neoplasm of upper-outer quadrant of left female 07/11/2014 Yes breast S21.002A Unspecified open wound of left breast, initial encounter 07/11/2014 Yes Inactive Problems Resolved Problems Electronic Signature(s) Signed: 02/17/2015 9:25:08 AM By: Britto, Errol [name], [name] Entered By: Britto, Errol on [name] 09:25:08 [name], [name] ([id number]) -------------------------------------------------------------------------------- Progress Note Details Patient Name: [name], [name] 02/17/2015 8:45 Date of Service: AM Medical Record [id number] Number: Patient Account Number: [id number] 07/02/1950 (63 y.o. 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 07/18/2014 -- since last week the patient has had [name] complaints and is on her last cycle of chemotherapy. I have reviewed 155 pages of her reports from a medical oncologist and the summary is made in the history of present illness. History of Present Illness (HPI) this 64 year old patient has come by herself today as a self-referral and has [name] documentation with her. She has had open wounds to her left chest and the right chest wall for about 2 months. She is not a diabetic and has [name] significant medical history except breast cancer. Her right breast cancer was diagnosed 18 years ago and she had a mastectomy and chemotherapy. 3 years ago she was then diagnosed with a breast cancer of the left side which was advanced and could not be operated and was given chemotherapy for a year and then followed by radiation. Her last radiation was over a year ago. She has not had any biopsy done recently from the ulcerated area but she says there have been other biopsies done from the chest wall and these reports are not available at the present time. She was told to keep the wounds open and let them dry out but she is finding it's more and more difficult to stop soiling her clothes if she leaves wounds open. She is on chemotherapy and we will try and obtain these notes from her oncologist. She is not in pain and does not have any significant problems except the discomfort from an open wound on the chest wall. 07/18/2014 This is a summary of Taquisha Phung date of birth [birth year]. These are obtained by reviewing 151 pages of medical records which were obtained from the [facility] 64 year old patient who  had a history of stage I right breast cancer status post modified radical mastectomy on 06/12/1996. 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 She received 4 cycles of AC at [facility]. In January 2014 she presented with left breast swelling and apparently cellulitis. Breast biopsy done revealed lobular carcinoma which was ER and PR positive and HER-2/neu negative. On PET scan and she was shown to have multiple areas of disease left breast, axilla and supraclavicular areas. She received 6 cycles of chemotherapy and then received Femara because she was not a surgical candidate. PET scan also revealed disease in the left lateral breast and new cervical lymph nodes at level II. The patient was then sent for radiation therapy and received this over a month. In January 2016 she had a PET CT scan done which was compatible with marked progression of disease with multifocal soft tissue masses in the chest wall left greater than right. [name] [name], [name] [name]. ([id number]) She is currently receiving Xeloda and is noted to have disease in the chest wall which is widely present. Her most recent lab work from 07/02/2014 shows that her CMP is within normal limits with a serum albumin of 4.4. The previous CBC was within normal limits with a WBC count of 4.3 hemoglobin of 14 hematocrit of 40.9 and platelets of 202. 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 We will continue with palliative care and make her comfortable as much as possible as far as her wound care goes. Present time there is [name] odor  but if this does occur we would use carbon- based products to mask the odor. Notes were also reviewed from her surgeon Dr. Jeffrey Byrnett --closure on 05/27/2014 for breast  biopsy in view of the fact that she had extensive disease on her chest wall. Biopsies are taken from the nodular metastatic disease overlying the sternum and nodules chosen for biopsy. Punch biopsies were taken and the pathology on this confirmed that this was consistent with carcinoma 08/15/2014 -- she is started on a new course of injectable chemotherapy and this is 2 weeks on and 2 weeks off. She has [name] new symptoms [name] bleeding from the wounds nor is there any odor. She is doing fine otherwise. 09/16/2014. She is doing very well and the right side wounds have completely healed. She has finished 2 cycles of chemotherapy and she is 2 weeks on and 2 weeks off. He is to restart her chemotherapy this week. [name] fresh complaints pain is within control and there is [name] odor from the wounds. 10/21/2014 -- overall she is doing very well and is on a clinical trial with her medical oncologist. [name] issues with the wound on her left side and there is [name] odor or drainage. 11/25/2014 -- she continues to be under clinical trial protocol with the medical oncologist and has [name] fresh issues. [name] -- she has been taken off the clinical trial protocol and now is on Taxol on a regular basis with her medical oncologists Dr. Cochran. Objective Constitutional Pulse regular. Respirations normal and unlabored. Afebrile. Vitals Time Taken: 9:01 AM, Height: 68 in, Weight: 180.5 lbs, BMI: 27.4, Temperature: 98.2 °F, Pulse: 86 bpm, Respiratory Rate: 16 breaths/min, Blood Pressure: 115/73 mmHg. Eyes Nonicteric. Reactive to light. Ears, Nose, Mouth, and Throat Lips, teeth, and gums WNL.. Moist mucosa without lesions . Neck Ramanathan, [place] W. ([id number]) supple and nontender. [name] palpable supraclavicular or cervical adenopathy. Normal sized without goiter. Respiratory WNL. [name] retractions.. Breath sounds WNL, [name] rubs, rales, rhonchi, or wheeze.. Cardiovascular Heart rhythm and rate regular, [name] murmur or gallop..  Pedal Pulses WNL. [name] clubbing, cyanosis or edema. Chest the right breast does not have any open ulcer and the chest wall has completely healed.the left chest wall does not have any open ulcerations. The left breast continues to be distorted and has induration. The lateral part of her chest wall and axilla has a very tiny ulcer which is still open which bleeds minimally on cleaning with saline.. Gastrointestinal (GI) Abdomen without masses or tenderness.. [name] liver or spleen enlargement or tenderness.. Lymphatic [name] adneopathy. [name] adenopathy. [name] adenopathy. Musculoskeletal Adexa without tenderness or enlargement.. Digits and nails w/o clubbing, cyanosis, infection, petechiae, ischemia, or inflammatory conditions.. Psychiatric Judgement and insight Intact.. [name] evidence of depression, anxiety, or agitation.. General Notes: The right breast does not have any open ulcer and the chest wall has completely healed. On the left breast she has an area which is indurated and on the lateral chest wall and axilla she has a very tiny ulcer which is still open which bleeds minimally on cleaning with saline. Integumentary (Hair, Skin) [name] suspicious lesions. [name] crepitus or fluctuance. [name] peri-wound warmth or erythema. [name] masses.. Wound #1 status is Open. Original cause of wound was Other Lesion. The wound is located on the Left Chest. The wound measures 0.7cm length x 2cm width x 0.1cm depth; 1.1cm^2 area and 0.11cm^3 volume. The wound is limited to skin breakdown. There is [name] tunneling or undermining noted. There is a medium amount of serosanguineous drainage   noted. The wound margin is distinct with the outline attached to the wound base. There is medium (34-66%) red granulation within the wound bed. There is a medium (34-66%) amount of necrotic tissue within the wound bed including Adherent Slough. The periwound skin appearance had [name] abnormalities noted for moisture. The periwound skin appearance had [name]  abnormalities noted for color. The periwound skin appearance exhibited: Induration, Scarring. The periwound skin appearance did not exhibit: Callus, Crepitus, Excoriation, Fluctuance, Friable, Localized Edema, Rash. Periwound temperature was noted as [name] Abnormality. Parcel, Asusena W. ([id number]) Assessment Active Problems ICD-10 C50.812 - Malignant neoplasm of overlapping sites of left female breast S21.001A - Unspecified open wound of right breast, initial encounter C50.412 - Malignant neoplasm of upper-outer quadrant of left female breast S21.002A - Unspecified open wound of left breast, initial encounter Plan Wound Cleansing: Wound #1 Left Chest: Clean wound with Normal Saline. Anesthetic: Wound #1 Left Chest: Topical Lidocaine 4% cream applied to wound bed prior to debridement Skin Barriers/Peri-Wound Care: Wound #1 Left Chest: Skin Prep Primary Wound Dressing: Wound #1 Left Chest: Aquacel Ag Secondary Dressing: Wound #1 Left Chest: Boardered Foam Dressing Dressing Change Frequency: Wound #1 Left Chest: Change dressing every other day. - or more if needed Follow-up Appointments: Wound #1 Left Chest: Return Appointment in 1 month She has done remarkably well with her chemotherapy and are local management has helped curtail the odor and is aiding in healing. we will continue with silver alginate and a bordered foam and continue supporting her as before. She will come back and see us every month. Fatzinger, Adra W. ([id number]) Electronic Signature(s) Signed: 02/17/2015 9:28:59 AM By: Britto, Errol [name], [name] Entered By: Britto, Errol on [name] 09:28:58 Ventrella, Luticia W. ([id number]) -------------------------------------------------------------------------------- SuperBill Details Patient Name: Vila, Quinnley W. Date of Service: 02/17/2015 Medical Record Number: [id number] Patient Account Number: [id number] Date of Birth/Sex: 08/19/1950 (63 y.o. Female) Treating RN:  Dorthy, Joanna Primary Care Physician: [name] [name] Other Clinician: Referring Physician: Treating Physician/Extender: Britto, Errol Weeks in Treatment: 31 Diagnosis Coding ICD-10 Codes Code Description C50.812 Malignant neoplasm of overlapping sites of left female breast S21.001A Unspecified open wound of right breast, initial encounter C50.412 Malignant neoplasm of upper-outer quadrant of left female breast S21.002A Unspecified open wound of left breast, initial encounter Facility Procedures CPT4 Code: [id number] Description: 99212 - WOUND CARE VISIT-LEV 2 EST PT Modifier: Quantity: 1 Physician Procedures CPT4 Code Description: [id number] 99213 - WC PHYS LEVEL 3 - EST PT ICD-10 Description Diagnosis C50.812 Malignant neoplasm of overlapping sites of left S21.001A Unspecified open wound of right breast, initial C50.412 Malignant neoplasm of upper-outer  quadrant of le S21.002A Unspecified open wound of left breast, initial e Modifier: female breast encounter ft female brea ncounter Quantity: 1 st Electronic Signature(s) Signed: 02/17/2015 9:29:12 AM By: Britto, Errol [name], [name] Entered By: Britto, Errol on [name] 09:29:11 

## 2015-02-18 NOTE — Progress Notes (Signed)
[name] [name], [name] [name] ([id number]) Visit Report for 02/17/2015 Arrival Information Details Patient Name: [name] [name], [name] [name] Date of Service: 02/17/2015 8:45 AM Medical Record Number: [id number] Patient Account Number: [id number] Date of Birth/Sex: 22-Feb-1951 (63 y.o. Female) Treating RN: Montey Hora Primary Care Physician: PATIENT, NO Other Clinician: Referring Physician: Treating Physician/Extender: Frann Rider in Treatment: 68 Visit Information History Since Last Visit Added or deleted any medications: No Patient Arrived: Ambulatory Any new allergies or adverse reactions: No Arrival Time: 09:00 Had a fall or experienced change in No Accompanied By: self activities of daily living that may affect Transfer Assistance: None risk of falls: Patient Identification Verified: Yes Signs or symptoms of abuse/neglect since last No Secondary Verification Process Yes visito Completed: Hospitalized since last visit: No Patient Requires Transmission-Based No Pain Present Now: No Precautions: Patient Has Alerts: No Electronic Signature(s) Signed: 02/17/2015 4:46:58 PM By: Montey Hora Entered By: Montey Hora on 02/17/2015 09:00:45 [name] [name] ([id number]) -------------------------------------------------------------------------------- Clinic Level of Care Assessment Details Patient Name: [name] [name] Date of Service: 02/17/2015 8:45 AM Medical Record Number: [id number] Patient Account Number: [id number] Date of Birth/Sex: 1950-10-07 (63 y.o. Female) Treating RN: Montey Hora Primary Care Physician: PATIENT, NO Other Clinician: Referring Physician: Treating Physician/Extender: Frann Rider in Treatment: 31 Clinic Level of Care Assessment Items TOOL 4 Quantity Score []  - Use when only an EandM is performed on FOLLOW-UP visit 0 ASSESSMENTS - Nursing Assessment / Reassessment X - Reassessment of Co-morbidities (includes updates in patient status) 1 10 X -  Reassessment of Adherence to Treatment Plan 1 5 ASSESSMENTS - Wound and Skin Assessment / Reassessment X - Simple Wound Assessment / Reassessment - one wound 1 5 []  - Complex Wound Assessment / Reassessment - multiple wounds 0 []  - Dermatologic / Skin Assessment (not related to wound area) 0 ASSESSMENTS - Focused Assessment []  - Circumferential Edema Measurements - multi extremities 0 []  - Nutritional Assessment / Counseling / Intervention 0 []  - Lower Extremity Assessment (monofilament, tuning fork, pulses) 0 []  - Peripheral Arterial Disease Assessment (using hand held doppler) 0 ASSESSMENTS - Ostomy and/or Continence Assessment and Care []  - Incontinence Assessment and Management 0 []  - Ostomy Care Assessment and Management (repouching, etc.) 0 PROCESS - Coordination of Care X - Simple Patient / Family Education for ongoing care 1 15 []  - Complex (extensive) Patient / Family Education for ongoing care 0 []  - Staff obtains Programmer, systems, Records, Test Results / Process Orders 0 []  - Staff telephones HHA, Nursing Homes / Clarify orders / etc 0 []  - Routine Transfer to another Facility (non-emergent condition) 0 [place], [place] ([id number]) []  - Routine Hospital Admission (non-emergent condition) 0 []  - New Admissions / Biomedical engineer / Ordering NPWT, Apligraf, etc. 0 []  - Emergency Hospital Admission (emergent condition) 0 X - Simple Discharge Coordination 1 10 []  - Complex (extensive) Discharge Coordination 0 PROCESS - Special Needs []  - Pediatric / Minor Patient Management 0 []  - Isolation Patient Management 0 []  - Hearing / Language / Visual special needs 0 []  - Assessment of Community assistance (transportation, D/C planning, etc.) 0 []  - Additional assistance / Altered mentation 0 []  - Support Surface(s) Assessment (bed, cushion, seat, etc.) 0 INTERVENTIONS - Wound Cleansing / Measurement X - Simple Wound Cleansing - one wound 1 5 []  - Complex Wound Cleansing - multiple  wounds 0 X - Wound Imaging (photographs - any number of wounds) 1 5 []  - Wound Tracing (instead of photographs) 0 X - Simple Wound Measurement -  one wound 1 5 []  - Complex Wound Measurement - multiple wounds 0 INTERVENTIONS - Wound Dressings X - Small Wound Dressing one or multiple wounds 1 10 []  - Medium Wound Dressing one or multiple wounds 0 []  - Large Wound Dressing one or multiple wounds 0 []  - Application of Medications - topical 0 []  - Application of Medications - injection 0 INTERVENTIONS - Miscellaneous []  - External ear exam 0 [place], Shirly W. ([id number]) []  - Specimen Collection (cultures, biopsies, blood, body fluids, etc.) 0 []  - Specimen(s) / Culture(s) sent or taken to Lab for analysis 0 []  - Patient Transfer (multiple staff / Harrel Lemon Lift / Similar devices) 0 []  - Simple Staple / Suture removal (25 or less) 0 []  - Complex Staple / Suture removal (26 or more) 0 []  - Hypo / Hyperglycemic Management (close monitor of Blood Glucose) 0 []  - Ankle / Brachial Index (ABI) - do not check if billed separately 0 X - Vital Signs 1 5 Has the patient been seen at the hospital within the last three years: Yes Total Score: 75 Level Of Care: New/Established - Level 2 Electronic Signature(s) Signed: 02/17/2015 4:46:58 PM By: Montey Hora Entered By: Montey Hora on 02/17/2015 09:10:31 [name] [name] ([id number]) -------------------------------------------------------------------------------- Encounter Discharge Information Details Patient Name: [name] [name] Date of Service: 02/17/2015 8:45 AM Medical Record Number: [id number] Patient Account Number: [id number] Date of Birth/Sex: [birth year] (63 y.o. Female) Treating RN: Montey Hora Primary Care Physician: PATIENT, NO Other Clinician: Referring Physician: Treating Physician/Extender: Frann Rider in Treatment: 59 Encounter Discharge Information Items Discharge Pain Level: 0 Discharge Condition:  Stable Ambulatory Status: Ambulatory Discharge Destination: Home Transportation: Private Auto Accompanied By: self Schedule Follow-up Appointment: Yes Medication Reconciliation completed and provided to Patient/Care No [name] [name]: Provided on Clinical Summary of Care: 02/17/2015 Form Type Recipient Paper Patient EG Electronic Signature(s) Signed: 02/17/2015 9:16:43 AM By: Ruthine Dose Entered By: Ruthine Dose on 02/17/2015 09:16:43 [name] [name] ([id number]) -------------------------------------------------------------------------------- Multi Wound Chart Details Patient Name: [name] [name] Date of Service: 02/17/2015 8:45 AM Medical Record Number: [id number] Patient Account Number: [id number] Date of Birth/Sex: 01-26-1951 (63 y.o. 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] [N/A:N/A] Comorbid History: [1:Received Chemotherapy, Received Radiation] [N/A:N/A] Date Acquired: [1:05/10/2014] [N/A:N/A] Weeks of Treatment: [1:31] [N/A:N/A] Wound Status: [1:Open] [N/A:N/A] Measurements L x W x D 0.7x2x0.1 [N/A:N/A] (cm) Area (cm) : [1:1.1] [N/A:N/A] Volume (cm) : [1:0.11] [N/A:N/A] % Reduction in Area: [1:95.30%] [N/A:N/A] % Reduction in Volume: 97.70% [N/A:N/A] Classification: [1:Full Thickness Without Exposed Support Structures] [N/A:N/A] Exudate Amount: [1:Medium] [N/A:N/A] Exudate Type: [1:Serosanguineous] [N/A:N/A] Exudate Color: [1:red, brown] [N/A:N/A] Wound Margin: [1:Distinct, outline attached]  [N/A:N/A] Granulation Amount: [1:Medium (34-66%)] [N/A:N/A] Granulation Quality: [1:Red] [N/A:N/A] Necrotic Amount: [1:Medium (34-66%)] [N/A:N/A] Exposed Structures: [1:Fascia: No Fat: No Tendon: No Muscle: No Joint: No] [N/A:N/A] Bone: No Limited to Skin Breakdown Epithelialization: None N/A N/A Periwound Skin Texture: Induration: Yes N/A N/A Scarring: Yes Edema: No Excoriation: No Callus: No Crepitus: No Fluctuance: No Friable: No Rash: No Periwound Skin No Abnormalities Noted N/A N/A Moisture: Periwound Skin Color: No Abnormalities Noted N/A N/A Temperature: No Abnormality N/A N/A Tenderness on No N/A N/A Palpation: Wound Preparation: Ulcer Cleansing: N/A N/A  Rinsed/Irrigated with Saline Topical Anesthetic Applied: Other: lidocaine 4% Treatment Notes Electronic Signature(s) Signed: 02/17/2015 4:46:58 PM By: Montey Hora Entered By: Montey Hora on 02/17/2015 09:09:27 [name] [name] ([id number]) -------------------------------------------------------------------------------- [place] Details Patient Name: [name] [name] Date of Service: 02/17/2015 8:45 AM Medical Record Number: [id number] Patient Account Number: [id number] Date of Birth/Sex: March 13, 1951 (63 y.o. Female) Treating RN: Montey Hora Primary Care Physician: PATIENT, NO Other Clinician: Referring Physician: Treating Physician/Extender: Frann Rider in Treatment: 19 Active Inactive Abuse / Safety / Falls / Self Care Management Nursing Diagnoses: Potential for falls Goals: Patient will remain injury free Date Initiated: 07/11/2014 Goal Status: Active Interventions: Assess fall risk on admission and as needed Notes: Malignancy/Atypical Etiology Nursing Diagnoses: Knowledge deficit related to disease process and management of atypical ulcer etiology Goals: Patient/caregiver will verbalize understanding of disease process and disease management of atypical  ulcer etiology Date Initiated: 07/11/2014 Goal Status: Active Interventions: Provide education on atypical ulcer etiologies Notes: Nutrition Nursing Diagnoses: Potential for alteratiion in Nutrition/Potential for imbalanced nutrition Goals: [name] [name], [name] [name] ([id number]) Patient/caregiver agrees to and verbalizes understanding of need to use nutritional supplements and/or vitamins as prescribed Date Initiated: 07/11/2014 Goal Status: Active Interventions: Assess patient nutrition upon admission and as needed per policy Notes: Orientation to the Wound Care Program Nursing Diagnoses: Knowledge deficit related to the wound healing center program Goals: Patient/caregiver will verbalize understanding of the [place] [facility] Date Initiated: 07/11/2014 Goal Status: Active Interventions: Provide education on orientation to the wound center Notes: Wound/Skin Impairment Nursing Diagnoses: Impaired tissue integrity Goals: Patient will demonstrate a reduced rate of smoking or cessation of smoking Date Initiated: 07/11/2014 Goal Status: Active Patient will have a decrease in wound volume by X% from date: (specify in notes) Date Initiated: 07/11/2014 Goal Status: Active Patient/caregiver will verbalize understanding of skin care regimen Date Initiated: 07/11/2014 Goal Status: Active Ulcer/skin breakdown will have a volume reduction of 30% by week 4 Date Initiated: 07/11/2014 Goal Status: Active Ulcer/skin breakdown will have a volume reduction of 50% by week 8 Date Initiated: 07/11/2014 Goal Status: Active Ulcer/skin breakdown will have a volume reduction of 80% by week 12 [name] [name], [name] [name] ([id number]) Date Initiated: 07/11/2014 Goal Status: Active Ulcer/skin breakdown will heal within 14 weeks Date Initiated: 07/11/2014 Goal Status: Active Interventions: Assess patient/caregiver ability to obtain necessary supplies Assess patient/caregiver ability to perform ulcer/skin  care regimen upon admission and as needed Assess ulceration(s) every visit Provide education on smoking Provide education on ulcer and skin care Screen for HBO Notes: Electronic Signature(s) Signed: 02/17/2015 4:46:58 PM By: Montey Hora Entered By: Montey Hora on 02/17/2015 09:08:46 [name] [name] ([id number]) -------------------------------------------------------------------------------- Patient/Caregiver Education Details Patient Name: [name] [name] Date of Service: 02/17/2015 8:45 AM Medical Record Number: [id number] Patient Account Number: [id number] Date of Birth/Gender: [birth year] (63 y.o. Female) Treating RN: Montey Hora Primary Care Physician: PATIENT, NO Other Clinician: Referring Physician: Treating Physician/Extender: Frann Rider in Treatment: 31 Education Assessment Education Provided To: Patient Education Topics Provided Wound/Skin Impairment: Handouts: Other: wound care and wound healing Methods: Demonstration, Explain/Verbal Responses: State content correctly Electronic Signature(s) Signed: 02/17/2015 4:46:58 PM By: Montey Hora Entered By: Montey Hora on 02/17/2015 09:14:27 [name] [name] ([id number]) -------------------------------------------------------------------------------- Wound Assessment Details Patient Name: [name] [name] Date of Service: 02/17/2015 8:45 AM Medical Record Number: [id number] Patient Account Number: [id number] Date of Birth/Sex: Jul 19, 1950 (63 y.o. Female) Treating RN: Montey Hora Primary Care Physician: PATIENT, NO Other Clinician: Referring Physician: Treating Physician/Extender: Frann Rider in Treatment:  31 Wound Status Wound Number: 1 Primary Malignant Wound Etiology: Wound Location: Left Chest Wound Status: Open Wounding Event: Other Lesion Comorbid Received Chemotherapy, Received Date Acquired: 05/10/2014 History: Radiation Weeks Of Treatment: 31 Clustered Wound:  No Photos Photo Uploaded By: Montey Hora on 02/17/2015 09:19:17 Wound Measurements Length: (cm) 0.7 Width: (cm) 2 Depth: (cm) 0.1 Area: (cm) 1.1 Volume: (cm) 0.11 % Reduction in Area: 95.3% % Reduction in Volume: 97.7% Epithelialization: None Tunneling: No Undermining: No Wound Description Full Thickness Without Exposed Classification: Support Structures Wound Margin: Distinct, outline attached Exudate Medium Amount: Exudate Type: Serosanguineous Exudate Color: red, brown Foul Odor After Cleansing: No Wound Bed Granulation Amount: Medium (34-66%) Exposed Structure Granulation Quality: Red Fascia Exposed: No Necrotic Amount: Medium (34-66%) Fat Layer Exposed: No [name] [name], [name] ([id number]) Necrotic Quality: Adherent Slough Tendon Exposed: No Muscle Exposed: No Joint Exposed: No Bone Exposed: No Limited to Skin Breakdown Periwound Skin Texture Texture Color No Abnormalities Noted: No No Abnormalities Noted: Yes Callus: No Temperature / Pain Crepitus: No Temperature: No Abnormality Excoriation: No Fluctuance: No Friable: No Induration: Yes Localized Edema: No Rash: No Scarring: Yes Moisture No Abnormalities Noted: Yes Wound Preparation Ulcer Cleansing: Rinsed/Irrigated with Saline Topical Anesthetic Applied: Other: lidocaine 4%, Treatment Notes Wound #1 (Left Chest) 1. Cleansed with: Clean wound with Normal Saline 2. Anesthetic Topical Lidocaine 4% cream to wound bed prior to debridement 3. Peri-wound Care: Skin Prep 4. Dressing Applied: Aquacel Ag 5. Secondary Dressing Applied Bordered Foam Dressing Electronic Signature(s) Signed: 02/17/2015 4:46:58 PM By: Montey Hora Entered By: Montey Hora on 02/17/2015 09:05:45 [name] [name] ([id number]) -------------------------------------------------------------------------------- [place] Details Patient Name: [name] [name] Date of Service: 02/17/2015 8:45 AM Medical Record Number:  [id number] Patient Account Number: [id number] Date of Birth/Sex: 1951/01/05 (63 y.o. Female) Treating RN: Montey Hora Primary Care Physician: PATIENT, NO Other Clinician: Referring Physician: Treating Physician/Extender: Frann Rider in Treatment: 31 Vital Signs Time Taken: 09:01 Temperature (F): 98.2 Height (in): 68 Pulse (bpm): 86 Weight (lbs): 180.5 Respiratory Rate (breaths/min): 16 Body Mass Index (BMI): 27.4 Blood Pressure (mmHg): 115/73 Reference Range: 80 - 120 mg / dl Electronic Signature(s) Signed: 02/17/2015 4:46:58 PM By: Montey Hora Entered By: Montey Hora on 02/17/2015 09:02:36

## 2015-02-23 ENCOUNTER — Other Ambulatory Visit: Payer: Self-pay | Admitting: Hematology and Oncology

## 2015-02-23 ENCOUNTER — Encounter: Payer: Self-pay | Admitting: Hematology and Oncology

## 2015-02-24 ENCOUNTER — Inpatient Hospital Stay: Payer: Medicare Other

## 2015-02-24 VITALS — BP 108/71 | HR 73 | Temp 96.4°F | Resp 18

## 2015-02-24 DIAGNOSIS — Z5111 Encounter for antineoplastic chemotherapy: Secondary | ICD-10-CM | POA: Diagnosis not present

## 2015-02-24 DIAGNOSIS — C50912 Malignant neoplasm of unspecified site of left female breast: Secondary | ICD-10-CM

## 2015-02-24 LAB — CBC WITH DIFFERENTIAL/PLATELET
Basophils Absolute: 0 10*3/uL (ref 0–0.1)
Basophils Relative: 1 %
Eosinophils Absolute: 0.1 10*3/uL (ref 0–0.7)
Eosinophils Relative: 2 %
HCT: 36.5 % (ref 35.0–47.0)
Hemoglobin: 12.2 g/dL (ref 12.0–16.0)
Lymphocytes Relative: 35 %
Lymphs Abs: 1 10*3/uL (ref 1.0–3.6)
MCH: 28.6 pg (ref 26.0–34.0)
MCHC: 33.3 g/dL (ref 32.0–36.0)
MCV: 85.7 fL (ref 80.0–100.0)
Monocytes Absolute: 0.2 10*3/uL (ref 0.2–0.9)
Monocytes Relative: 8 %
Neutro Abs: 1.5 10*3/uL (ref 1.4–6.5)
Neutrophils Relative %: 54 %
Platelets: 194 10*3/uL (ref 150–440)
RBC: 4.26 MIL/uL (ref 3.80–5.20)
RDW: 19.5 % — ABNORMAL HIGH (ref 11.5–14.5)
WBC: 2.8 10*3/uL — ABNORMAL LOW (ref 3.6–11.0)

## 2015-02-24 LAB — COMPREHENSIVE METABOLIC PANEL
ALT: 35 U/L (ref 14–54)
AST: 27 U/L (ref 15–41)
Albumin: 3.8 g/dL (ref 3.5–5.0)
Alkaline Phosphatase: 117 U/L (ref 38–126)
Anion gap: 8 (ref 5–15)
BUN: 16 mg/dL (ref 6–20)
CO2: 24 mmol/L (ref 22–32)
Calcium: 8.7 mg/dL — ABNORMAL LOW (ref 8.9–10.3)
Chloride: 103 mmol/L (ref 101–111)
Creatinine, Ser: 0.82 mg/dL (ref 0.44–1.00)
GFR calc Af Amer: >60 mL/min (ref >60)
GFR calc non Af Amer: >60 mL/min (ref >60)
Glucose, Bld: 109 mg/dL — ABNORMAL HIGH (ref 65–99)
Potassium: 3.6 mmol/L (ref 3.5–5.1)
Sodium: 135 mmol/L (ref 135–145)
Total Bilirubin: 0.6 mg/dL (ref 0.3–1.2)
Total Protein: 7.4 g/dL (ref 6.5–8.1)

## 2015-02-24 MED ORDER — SODIUM CHLORIDE 0.9 % IV SOLN
Freq: Once | INTRAVENOUS | Status: AC
  Administered 2015-02-24: 13:00:00 via INTRAVENOUS
  Filled 2015-02-24: qty 4

## 2015-02-24 MED ORDER — DIPHENHYDRAMINE HCL 50 MG/ML IJ SOLN
50.0000 mg | Freq: Once | INTRAMUSCULAR | Status: AC
  Administered 2015-02-24: 50 mg via INTRAVENOUS
  Filled 2015-02-24: qty 1

## 2015-02-24 MED ORDER — SODIUM CHLORIDE 0.9 % IV SOLN
Freq: Once | INTRAVENOUS | Status: AC
  Administered 2015-02-24: 12:00:00 via INTRAVENOUS
  Filled 2015-02-24: qty 1000

## 2015-02-24 MED ORDER — HEPARIN SOD (PORK) LOCK FLUSH 100 UNIT/ML IV SOLN
500.0000 [IU] | Freq: Once | INTRAVENOUS | Status: AC
  Administered 2015-02-24: 500 [IU] via INTRAVENOUS
  Filled 2015-02-24: qty 5

## 2015-02-24 MED ORDER — SODIUM CHLORIDE 0.9 % IJ SOLN
10.0000 mL | INTRAMUSCULAR | Status: DC | PRN
  Administered 2015-02-24: 10 mL via INTRAVENOUS
  Filled 2015-02-24: qty 10

## 2015-02-24 MED ORDER — PACLITAXEL CHEMO INJECTION 300 MG/50ML
80.0000 mg/m2 | Freq: Once | INTRAVENOUS | Status: AC
  Administered 2015-02-24: 150 mg via INTRAVENOUS
  Filled 2015-02-24: qty 25

## 2015-02-24 MED ORDER — FAMOTIDINE IN NACL 20-0.9 MG/50ML-% IV SOLN
20.0000 mg | Freq: Once | INTRAVENOUS | Status: AC
  Administered 2015-02-24: 20 mg via INTRAVENOUS
  Filled 2015-02-24: qty 50

## 2015-03-04 ENCOUNTER — Other Ambulatory Visit: Payer: Self-pay

## 2015-03-04 DIAGNOSIS — C50912 Malignant neoplasm of unspecified site of left female breast: Secondary | ICD-10-CM

## 2015-03-10 ENCOUNTER — Inpatient Hospital Stay (HOSPITAL_BASED_OUTPATIENT_CLINIC_OR_DEPARTMENT_OTHER): Payer: Medicare Other | Admitting: Hematology and Oncology

## 2015-03-10 ENCOUNTER — Inpatient Hospital Stay: Payer: Medicare Other

## 2015-03-10 ENCOUNTER — Other Ambulatory Visit: Payer: Self-pay | Admitting: Hematology and Oncology

## 2015-03-10 VITALS — BP 113/70 | HR 77 | Resp 18

## 2015-03-10 VITALS — BP 131/86 | HR 91 | Temp 97.5°F | Resp 18 | Ht 67.5 in | Wt 166.2 lb

## 2015-03-10 DIAGNOSIS — F17210 Nicotine dependence, cigarettes, uncomplicated: Secondary | ICD-10-CM

## 2015-03-10 DIAGNOSIS — Z79899 Other long term (current) drug therapy: Secondary | ICD-10-CM

## 2015-03-10 DIAGNOSIS — Z170 Estrogen receptor positive status [ER+]: Secondary | ICD-10-CM | POA: Diagnosis not present

## 2015-03-10 DIAGNOSIS — G629 Polyneuropathy, unspecified: Secondary | ICD-10-CM | POA: Diagnosis not present

## 2015-03-10 DIAGNOSIS — Z5111 Encounter for antineoplastic chemotherapy: Secondary | ICD-10-CM | POA: Diagnosis not present

## 2015-03-10 DIAGNOSIS — C50912 Malignant neoplasm of unspecified site of left female breast: Secondary | ICD-10-CM

## 2015-03-10 DIAGNOSIS — Z79811 Long term (current) use of aromatase inhibitors: Secondary | ICD-10-CM

## 2015-03-10 DIAGNOSIS — E876 Hypokalemia: Secondary | ICD-10-CM

## 2015-03-10 DIAGNOSIS — Z853 Personal history of malignant neoplasm of breast: Secondary | ICD-10-CM

## 2015-03-10 DIAGNOSIS — C792 Secondary malignant neoplasm of skin: Principal | ICD-10-CM

## 2015-03-10 LAB — CBC WITH DIFFERENTIAL/PLATELET
Basophils Absolute: 0 10*3/uL (ref 0–0.1)
Basophils Relative: 0 %
Eosinophils Absolute: 0 10*3/uL (ref 0–0.7)
Eosinophils Relative: 1 %
HCT: 36.7 % (ref 35.0–47.0)
Hemoglobin: 12.2 g/dL (ref 12.0–16.0)
Lymphocytes Relative: 33 %
Lymphs Abs: 1 10*3/uL (ref 1.0–3.6)
MCH: 28.6 pg (ref 26.0–34.0)
MCHC: 33.3 g/dL (ref 32.0–36.0)
MCV: 85.8 fL (ref 80.0–100.0)
Monocytes Absolute: 0.4 10*3/uL (ref 0.2–0.9)
Monocytes Relative: 12 %
Neutro Abs: 1.6 10*3/uL (ref 1.4–6.5)
Neutrophils Relative %: 54 %
Platelets: 219 10*3/uL (ref 150–440)
RBC: 4.28 MIL/uL (ref 3.80–5.20)
RDW: 19.4 % — ABNORMAL HIGH (ref 11.5–14.5)
WBC: 3 10*3/uL — ABNORMAL LOW (ref 3.6–11.0)

## 2015-03-10 LAB — COMPREHENSIVE METABOLIC PANEL
ALT: 33 U/L (ref 14–54)
AST: 31 U/L (ref 15–41)
Albumin: 3.6 g/dL (ref 3.5–5.0)
Alkaline Phosphatase: 128 U/L — ABNORMAL HIGH (ref 38–126)
Anion gap: 8 (ref 5–15)
BUN: 18 mg/dL (ref 6–20)
CO2: 26 mmol/L (ref 22–32)
Calcium: 9.2 mg/dL (ref 8.9–10.3)
Chloride: 105 mmol/L (ref 101–111)
Creatinine, Ser: 0.83 mg/dL (ref 0.44–1.00)
GFR calc Af Amer: >60 mL/min (ref >60)
GFR calc non Af Amer: >60 mL/min (ref >60)
Glucose, Bld: 108 mg/dL — ABNORMAL HIGH (ref 65–99)
Potassium: 3.2 mmol/L — ABNORMAL LOW (ref 3.5–5.1)
Sodium: 139 mmol/L (ref 135–145)
Total Bilirubin: 0.5 mg/dL (ref 0.3–1.2)
Total Protein: 7.2 g/dL (ref 6.5–8.1)

## 2015-03-10 LAB — MAGNESIUM: Magnesium: 2 mg/dL (ref 1.7–2.4)

## 2015-03-10 MED ORDER — FAMOTIDINE IN NACL 20-0.9 MG/50ML-% IV SOLN
20.0000 mg | Freq: Once | INTRAVENOUS | Status: AC
  Administered 2015-03-10: 20 mg via INTRAVENOUS
  Filled 2015-03-10: qty 50

## 2015-03-10 MED ORDER — HEPARIN SOD (PORK) LOCK FLUSH 100 UNIT/ML IV SOLN
500.0000 [IU] | Freq: Once | INTRAVENOUS | Status: AC | PRN
  Administered 2015-03-10: 500 [IU]
  Filled 2015-03-10: qty 5

## 2015-03-10 MED ORDER — SODIUM CHLORIDE 0.9 % IV SOLN
Freq: Once | INTRAVENOUS | Status: AC
  Administered 2015-03-10: 11:00:00 via INTRAVENOUS
  Filled 2015-03-10: qty 1000

## 2015-03-10 MED ORDER — SODIUM CHLORIDE 0.9 % IV SOLN
Freq: Once | INTRAVENOUS | Status: AC
  Administered 2015-03-10: 11:00:00 via INTRAVENOUS
  Filled 2015-03-10: qty 4

## 2015-03-10 MED ORDER — SODIUM CHLORIDE 0.9 % IJ SOLN
10.0000 mL | INTRAMUSCULAR | Status: DC | PRN
  Administered 2015-03-10: 10 mL
  Filled 2015-03-10: qty 10

## 2015-03-10 MED ORDER — DIPHENHYDRAMINE HCL 50 MG/ML IJ SOLN
50.0000 mg | Freq: Once | INTRAMUSCULAR | Status: AC
  Administered 2015-03-10: 50 mg via INTRAVENOUS
  Filled 2015-03-10: qty 1

## 2015-03-10 MED ORDER — PACLITAXEL CHEMO INJECTION 300 MG/50ML
80.0000 mg/m2 | Freq: Once | INTRAVENOUS | Status: AC
  Administered 2015-03-10: 150 mg via INTRAVENOUS
  Filled 2015-03-10: qty 25

## 2015-03-10 NOTE — Progress Notes (Signed)
Numbness and tingling in hands feel almost back to normal.  Left hand is hand that is worse than right however almost back to normal.  Reports feet still numb

## 2015-03-10 NOTE — Progress Notes (Signed)
[facility] day:  03/10/2015   Chief Complaint: [name] [name] is a 64 y.o. female with recurrent breast cancer who is seen for assessment prior to week #1 of cycle #2 Taxol.  HPI: The patient was last seen in the medical oncology clinic on 02/17/2015.  At that time, she received week 2 of cycle #1 Taxol.  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  During the interim, she has continued Taxol (3 weeks on/1 week off).  Her off week was on 03/03/2015.  She states that the neuropathy in her feet are stable.  She notes a little bit of numbness and tingling in her left hand.  Her right hand almost feels normal.  Her neuropathy has not increased.  Regarding her chest wall changes, her open areas continue to get smaller. She has not been to wound care.  Past Medical History  Diagnosis Date  . Personal history of malignant neoplasm of breast   . Anemia   . Blood transfusion without reported diagnosis   . Emphysema of lung ([place])   . Breast cancer [facility])     Past Surgical History  Procedure Laterality Date  . Oophrectomy  1980  . Abdominal hysterectomy  1995  . Breast surgery Right 1996    mastectomy  . Breast biopsy Left 2014  . Breast biopsy Left 01-21-14    Family History  Problem Relation Age of Onset  . Cancer Father   . Heart disease Sister   . Diabetes Sister   . Heart disease Brother   . Cancer Paternal Grandmother     breast    Social History:  reports that she has been smoking Cigarettes.  She has a 6.25 pack-year smoking history. She has never used smokeless tobacco. She reports that she does not drink alcohol or use illicit drugs.  She is alone today.  Allergies:  Allergies  Allergen Reactions  . Sulfa Antibiotics Nausea And Vomiting    headache    Current Medications: Current Outpatient Prescriptions  Medication Sig Dispense Refill  . Calcium Carbonate-Vitamin D (CALCIUM + D PO) Take 2 tablets by mouth daily.    Marland Kitchen KLOR-CON 10 10  MEQ tablet TAKE 1 TABLET (10 MEQ TOTAL) BY MOUTH DAILY. 30 tablet 1  . LORazepam (ATIVAN) 0.5 MG tablet   0  . traMADol (ULTRAM) 50 MG tablet Take 1 tablet (50 mg total) by mouth every 8 (eight) hours as needed. (Patient not taking: Reported on 03/10/2015) 90 tablet 0   No current facility-administered medications for this visit.   Facility-Administered Medications Ordered in Other Visits  Medication Dose Route Frequency Provider Last Rate Last Dose  . sodium chloride 0.9 % injection 10 mL  10 mL Intravenous PRN Lequita Asal, MD   10 mL at 10/08/14 1335  . sodium chloride 0.9 % injection 10 mL  10 mL Intracatheter PRN Lequita Asal, MD   10 mL at 11/26/14 1355    Review of Systems:  GENERAL:  Feels good.  No fevers or sweats.  Weight up 2 pounds. PERFORMANCE STATUS (ECOG):  1 HEENT:  No visual changes, runny nose, sore throat, mouth sores or tenderness. Lungs: No shortness of breath or cough.  No hemoptysis. Cardiac:  No chest pain, palpitations, orthopnea, or PND. GI:  No nausea, vomiting, diarrhea, constipation, melena or hematochezia. GU:  No urgency, frequency, dysuria, or hematuria. Musculoskeletal:  No back pain.  No joint pain.  No muscle tenderness. Extremities:  No pain  or swelling. Skin:  Hair thin.  No rashes or skin changes. Neuro:  Neuropathy in feet, stable.  Little neuropathy in left hand.  No headache, numbness or weakness, or coordination issues. Endocrine:  No diabetes, thyroid issues, hot flashes or night sweats. Psych:  No mood changes, depression or anxiety. Pain: No complaints. Review of systems:  All other systems reviewed and found to be negative.   Physical Exam: Blood pressure 131/86, pulse 91, temperature 97.5 F (36.4 C), temperature source Oral, resp. rate 18, height 5' 7.5" (1.715 m), weight 166 lb 3.6 oz (75.4 kg).  GENERAL: Well developed, well nourished, sitting comfortably in the exam room in no acute distress. MENTAL STATUS: Alert and  oriented to person, place and time. HEAD: Short gray hair. Normocephalic, atraumatic, face symmetric, no Cushingoid features. EYES: Brown eyes. Pupils equal round and reactive to light and accomodation. No conjunctivitis or scleral icterus. ENT: Oropharynx clear without lesion. Tongue normal. Mucous membranes moist.  NECK:  Left neck cyst (stable). RESPIRATORY: Clear to auscultation without rales, wheezes or rhonchi. CARDIOVASCULAR: Regular rate and rhythm without murmur, rub or gallop. BREAST: Right chest wall lesions healed. Central upper sternal area flat.  Left 1.5 cm axillary nodule (stable).  Chest wall thickened (stable).  Left lateral denuded area triangular in shape ( 70m). ABDOMEN: Soft, non-tender, with active bowel sounds, and no hepatosplenomegaly. No masses. SKIN: Cutaneous breast cancer noted in breast section. No other lesions.  Long nails. EXTREMITIES: No edema, no skin discoloration or tenderness. No palpable cords. LYMPH NODES: Left axillary ridge (stable). No palpable cervical, supraclavicular, axillary or inguinal adenopathy  NEUROLOGICAL: Unremarkable. Bilateral patellar DTR trace. PSYCH: Appropriate.  Infusion on 03/10/2015  Component Date Value Ref Range Status  . WBC 03/10/2015 3.0* 3.6 - 11.0 K/uL Final  . RBC 03/10/2015 4.28  3.80 - 5.20 MIL/uL Final  . Hemoglobin 03/10/2015 12.2  12.0 - 16.0 g/dL Final  . HCT 03/10/2015 36.7  35.0 - 47.0 % Final  . MCV 03/10/2015 85.8  80.0 - 100.0 fL Final  . MCH 03/10/2015 28.6  26.0 - 34.0 pg Final  . MCHC 03/10/2015 33.3  32.0 - 36.0 g/dL Final  . RDW 03/10/2015 19.4* 11.5 - 14.5 % Final  . Platelets 03/10/2015 219  150 - 440 K/uL Final  . Neutrophils Relative % 03/10/2015 54   Final  . Neutro Abs 03/10/2015 1.6  1.4 - 6.5 K/uL Final  . Lymphocytes Relative 03/10/2015 33   Final  . Lymphs Abs 03/10/2015 1.0  1.0 - 3.6 K/uL Final  . Monocytes Relative 03/10/2015 12   Final  . Monocytes Absolute  03/10/2015 0.4  0.2 - 0.9 K/uL Final  . Eosinophils Relative 03/10/2015 1   Final  . Eosinophils Absolute 03/10/2015 0.0  0 - 0.7 K/uL Final  . Basophils Relative 03/10/2015 0   Final  . Basophils Absolute 03/10/2015 0.0  0 - 0.1 K/uL Final  . Sodium 03/10/2015 139  135 - 145 mmol/L Final  . Potassium 03/10/2015 3.2* 3.5 - 5.1 mmol/L Final  . Chloride 03/10/2015 105  101 - 111 mmol/L Final  . CO2 03/10/2015 26  22 - 32 mmol/L Final  . Glucose, Bld 03/10/2015 108* 65 - 99 mg/dL Final  . BUN 03/10/2015 18  6 - 20 mg/dL Final  . Creatinine, Ser 03/10/2015 0.83  0.44 - 1.00 mg/dL Final  . Calcium 03/10/2015 9.2  8.9 - 10.3 mg/dL Final  . Total Protein 03/10/2015 7.2  6.5 - 8.1 g/dL Final  .  Albumin 03/10/2015 3.6  3.5 - 5.0 g/dL Final  . AST 03/10/2015 31  15 - 41 U/L Final  . ALT 03/10/2015 33  14 - 54 U/L Final  . Alkaline Phosphatase 03/10/2015 128* 38 - 126 U/L Final  . Total Bilirubin 03/10/2015 0.5  0.3 - 1.2 mg/dL Final  . GFR calc non Af Amer 03/10/2015 >60  >60 mL/min Final  . GFR calc Af Amer 03/10/2015 >60  >60 mL/min Final   Comment: (NOTE) The eGFR has been calculated using the CKD EPI equation. This calculation has not been validated in all clinical situations. eGFR's persistently <60 mL/min signify possible Chronic Kidney Disease.   . Anion gap 03/10/2015 8  5 - 15 Final  . Magnesium 03/10/2015 2.0  1.7 - 2.4 mg/dL Final    Assessment:  AZARYAH HEATHCOCK is a 64 y.o. African American woman with a history of stage I right breast cancer s/p modified radical mastectomy on 06/12/1996. Pathology revealed a grade III mutifocal infiltrating ductal carcinoma (tumor size 8.5 cm with an invasive component 1.6 cm). Twenty-one lymph nodes were negative. Tumor was ER/PR negative. She received 4 cycles of AC at [facility].  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 PET scan on  05/22/2012 revealed multiple areas of disease. She received 6 cycles of Taxotere and Cytoxan (TC) from 06/06/2012 - 09/29/2012. PET scan on 09/07/2012 noted marked improvement. Post chemotherapy biopsy was positive. She was not a surgical candidate. She was placed on Femara.  PET scan on 05/03/2013 revealed a new focus along lateral left breast and a new level II cervical lymph node. She received radiation from 09/13/2013 - 11/05/2013. PET scan on 01/01/2014 revealed new nodule medial left chest wall pleural/paraspinal activity LUL. Multiple biopsies on 01/19/2014 were positive. She began daily letrozole and Ibrance on 02/12/2014. Despite treatment, new nodules formed. She received a total of 2 cycles.   PET scan on 05/09/2014 revealed marked progression of disease with multifocal masses in chest wall (left > right). She did not receive Imbrance in 04/2014. CA27.29 has increased: 22.9 on 01/08/2014, 54 on 03/05/2014, 170.8 on 05/06/2014, 250.8 on 05/23/2014, 445.8 on 06/14/2014, and 531.1 on 07/01/2104.   She received 3 cycles of Xeloda (02/12 - 07/05/2014). Chest and abdomen CT scan on 07/05/2014 revealed persistent and slightly progressive diffuse locally invasive chest wall tumor. Bone scan on 07/25/2014 revealed no evidence of metastatic disease.   She received 8 cycles of [place] (08/06/2014 - 01/07/2015) on ACCRU [id number]. 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  Chest, abdomen, and pelvic CT scan on 10/24/2014 revealed improved multifocal inflammatory left breast cancer.  The dominant 2.6 x 3.5 cm lesion in the left upper outer left breast had decreased to 1.7 x 3.5 cm. The anterior sternal lesion has decreased from 2 x 4 cm to 1.5 x 3.9 cm. There were no suspicious mediastinal, hilar or axillary adenopathy.    Chest, abdomen, and pelvic CT scan on 01/20/2015 revealed inflammatory left breast cancer with new necrotic appearing subcutaneous nodules in the lateral  left breast and lateral left chest wall. CA27.29 was 339.8 on 08/06/2014, 51.7 on 10/15/2014, 37.6 on 12/10/2014, 26.4 on 01/07/2015, and 34.1 on 01/28/2015.   She is s/p cycle #1 Taxol given 3 weeks on/1 week off (began 02/10/2015).  Her neuropathy is stable.  She has mild hypokalemia.   Plan: 1.  Labs today: CBC with diff, CMP, CA27.29,  Mg. 2.  Begin cycle #2 week 1 Taxol. 3.  RTC weekly x 2 for labs and Taxol 4.  Potassium chloride 10 meq BID x 3 days then 10 meq a day. 5.  Schedule chest/abdomen CT scan in 3 1/2 weeks:  follow-up metastatic breast cancer 6.  RTC in 4 weeks for MD assess, labs (CBC with diff, CMP, CA27.29, Mg), review of chest/abdomen CT, and +/- week 1 of cycle #3 Taxol.   Lequita Asal, MD  03/10/2015, 10:19 AM

## 2015-03-11 LAB — CANCER ANTIGEN 27.29: CA 27.29: 49.3 U/mL — ABNORMAL HIGH (ref 0.0–38.6)

## 2015-03-17 ENCOUNTER — Encounter: Payer: Medicare Other | Attending: Surgery | Admitting: Surgery

## 2015-03-17 ENCOUNTER — Ambulatory Visit: Payer: Medicare Other | Admitting: Hematology and Oncology

## 2015-03-17 ENCOUNTER — Inpatient Hospital Stay: Payer: Medicare Other | Attending: Hematology and Oncology

## 2015-03-17 ENCOUNTER — Other Ambulatory Visit: Payer: Self-pay | Admitting: Hematology and Oncology

## 2015-03-17 ENCOUNTER — Inpatient Hospital Stay: Payer: Medicare Other

## 2015-03-17 DIAGNOSIS — C50812 Malignant neoplasm of overlapping sites of left female breast: Secondary | ICD-10-CM | POA: Diagnosis not present

## 2015-03-17 DIAGNOSIS — S21002A Unspecified open wound of left breast, initial encounter: Secondary | ICD-10-CM | POA: Insufficient documentation

## 2015-03-17 DIAGNOSIS — G629 Polyneuropathy, unspecified: Secondary | ICD-10-CM | POA: Diagnosis not present

## 2015-03-17 DIAGNOSIS — Z170 Estrogen receptor positive status [ER+]: Secondary | ICD-10-CM | POA: Insufficient documentation

## 2015-03-17 DIAGNOSIS — C50912 Malignant neoplasm of unspecified site of left female breast: Secondary | ICD-10-CM | POA: Diagnosis not present

## 2015-03-17 DIAGNOSIS — F17210 Nicotine dependence, cigarettes, uncomplicated: Secondary | ICD-10-CM | POA: Diagnosis not present

## 2015-03-17 DIAGNOSIS — Z5111 Encounter for antineoplastic chemotherapy: Secondary | ICD-10-CM | POA: Insufficient documentation

## 2015-03-17 DIAGNOSIS — Z79811 Long term (current) use of aromatase inhibitors: Secondary | ICD-10-CM | POA: Insufficient documentation

## 2015-03-17 DIAGNOSIS — Z853 Personal history of malignant neoplasm of breast: Secondary | ICD-10-CM | POA: Insufficient documentation

## 2015-03-17 DIAGNOSIS — S21001A Unspecified open wound of right breast, initial encounter: Secondary | ICD-10-CM | POA: Insufficient documentation

## 2015-03-17 DIAGNOSIS — C50412 Malignant neoplasm of upper-outer quadrant of left female breast: Secondary | ICD-10-CM | POA: Insufficient documentation

## 2015-03-17 DIAGNOSIS — Z79899 Other long term (current) drug therapy: Secondary | ICD-10-CM | POA: Insufficient documentation

## 2015-03-17 DIAGNOSIS — X58XXXA Exposure to other specified factors, initial encounter: Secondary | ICD-10-CM | POA: Diagnosis not present

## 2015-03-17 DIAGNOSIS — C792 Secondary malignant neoplasm of skin: Principal | ICD-10-CM

## 2015-03-17 LAB — COMPREHENSIVE METABOLIC PANEL
ALT: 43 U/L (ref 14–54)
AST: 40 U/L (ref 15–41)
Albumin: 3.8 g/dL (ref 3.5–5.0)
Alkaline Phosphatase: 146 U/L — ABNORMAL HIGH (ref 38–126)
Anion gap: 9 (ref 5–15)
BUN: 17 mg/dL (ref 6–20)
CO2: 23 mmol/L (ref 22–32)
Calcium: 8.8 mg/dL — ABNORMAL LOW (ref 8.9–10.3)
Chloride: 106 mmol/L (ref 101–111)
Creatinine, Ser: 0.89 mg/dL (ref 0.44–1.00)
GFR calc Af Amer: >60 mL/min (ref >60)
GFR calc non Af Amer: >60 mL/min (ref >60)
Glucose, Bld: 112 mg/dL — ABNORMAL HIGH (ref 65–99)
Potassium: 3.6 mmol/L (ref 3.5–5.1)
Sodium: 138 mmol/L (ref 135–145)
Total Bilirubin: 0.3 mg/dL (ref 0.3–1.2)
Total Protein: 7.1 g/dL (ref 6.5–8.1)

## 2015-03-17 LAB — CBC WITH DIFFERENTIAL/PLATELET
Basophils Absolute: 0 10*3/uL (ref 0–0.1)
Basophils Relative: 1 %
Eosinophils Absolute: 0.1 10*3/uL (ref 0–0.7)
Eosinophils Relative: 2 %
HCT: 36.8 % (ref 35.0–47.0)
Hemoglobin: 12.3 g/dL (ref 12.0–16.0)
Lymphocytes Relative: 30 %
Lymphs Abs: 1.2 10*3/uL (ref 1.0–3.6)
MCH: 28.9 pg (ref 26.0–34.0)
MCHC: 33.4 g/dL (ref 32.0–36.0)
MCV: 86.5 fL (ref 80.0–100.0)
Monocytes Absolute: 0.4 10*3/uL (ref 0.2–0.9)
Monocytes Relative: 9 %
Neutro Abs: 2.4 10*3/uL (ref 1.4–6.5)
Neutrophils Relative %: 58 %
Platelets: 216 10*3/uL (ref 150–440)
RBC: 4.25 MIL/uL (ref 3.80–5.20)
RDW: 19.4 % — ABNORMAL HIGH (ref 11.5–14.5)
WBC: 4 10*3/uL (ref 3.6–11.0)

## 2015-03-17 MED ORDER — SODIUM CHLORIDE 0.9 % IJ SOLN
10.0000 mL | INTRAMUSCULAR | Status: DC | PRN
Start: 2015-03-17 — End: 2015-03-17
  Administered 2015-03-17: 10 mL via INTRAVENOUS
  Filled 2015-03-17: qty 10

## 2015-03-17 MED ORDER — DIPHENHYDRAMINE HCL 50 MG/ML IJ SOLN
50.0000 mg | Freq: Once | INTRAMUSCULAR | Status: AC
  Administered 2015-03-17: 50 mg via INTRAVENOUS
  Filled 2015-03-17: qty 1

## 2015-03-17 MED ORDER — HEPARIN SOD (PORK) LOCK FLUSH 100 UNIT/ML IV SOLN
500.0000 [IU] | Freq: Once | INTRAVENOUS | Status: AC
  Administered 2015-03-17: 500 [IU] via INTRAVENOUS
  Filled 2015-03-17: qty 5

## 2015-03-17 MED ORDER — FAMOTIDINE IN NACL 20-0.9 MG/50ML-% IV SOLN
20.0000 mg | Freq: Once | INTRAVENOUS | Status: AC
  Administered 2015-03-17: 20 mg via INTRAVENOUS
  Filled 2015-03-17: qty 50

## 2015-03-17 MED ORDER — PACLITAXEL CHEMO INJECTION 300 MG/50ML
80.0000 mg/m2 | Freq: Once | INTRAVENOUS | Status: AC
  Administered 2015-03-17: 150 mg via INTRAVENOUS
  Filled 2015-03-17: qty 25

## 2015-03-17 MED ORDER — SODIUM CHLORIDE 0.9 % IV SOLN
Freq: Once | INTRAVENOUS | Status: AC
  Administered 2015-03-17: 12:00:00 via INTRAVENOUS
  Filled 2015-03-17: qty 4

## 2015-03-17 MED ORDER — SODIUM CHLORIDE 0.9 % IV SOLN
Freq: Once | INTRAVENOUS | Status: AC
  Administered 2015-03-17: 11:00:00 via INTRAVENOUS
  Filled 2015-03-17: qty 1000

## 2015-03-17 NOTE — Progress Notes (Addendum)
[name], [name] ([id number]) Visit Report for 03/17/2015 Arrival Information Details Patient Name: [name] [name], [name] [name] Date of Service: 03/17/2015 8:45 AM Medical Record Number: [id number] Patient Account Number: [id number] Date of Birth/Sex: 12-10-1950 (64 y.o. Female) Treating RN: Macarthur Critchley Primary Care Physician: PATIENT, NO Other Clinician: Referring Physician: Treating Physician/Extender: Frann Rider in Treatment: 71 Visit Information History Since Last Visit All ordered tests and consults were completed: No Patient Arrived: Ambulatory Added or deleted any medications: No Arrival Time: 08:51 Any new allergies or adverse reactions: No Accompanied By: self Had a fall or experienced change in No Transfer Assistance: None activities of daily living that may affect Patient Identification Verified: Yes risk of falls: Secondary Verification Process Yes Signs or symptoms of abuse/neglect since last No Completed: visito Patient Requires Transmission-Based No Hospitalized since last visit: No Precautions: Has Dressing in Place as Prescribed: Yes Patient Has Alerts: No Pain Present Now: No Electronic Signature(s) Signed: 03/17/2015 4:20:52 PM By: Rebecca Eaton, RN, Sendra Entered By: Rebecca Eaton RN, Sendra on 03/17/2015 08:52:37 [name] [name] ([id number]) -------------------------------------------------------------------------------- Clinic Level of Care Assessment Details Patient Name: [name] [name] Date of Service: 03/17/2015 8:45 AM Medical Record Number: [id number] Patient Account Number: [id number] Date of Birth/Sex: [birth year] (64 y.o. Female) Treating RN: Macarthur Critchley Primary Care Physician: PATIENT, NO Other Clinician: Referring Physician: Treating Physician/Extender: Frann Rider in Treatment: 35 Clinic Level of Care Assessment Items TOOL 4 Quantity Score []  - Use when only an EandM is performed on FOLLOW-UP visit 0 ASSESSMENTS - Nursing  Assessment / Reassessment X - Reassessment of Co-morbidities (includes updates in patient status) 1 10 X - Reassessment of Adherence to Treatment Plan 1 5 ASSESSMENTS - Wound and Skin Assessment / Reassessment X - Simple Wound Assessment / Reassessment - one wound 1 5 []  - Complex Wound Assessment / Reassessment - multiple wounds 0 []  - Dermatologic / Skin Assessment (not related to wound area) 0 ASSESSMENTS - Focused Assessment []  - Circumferential Edema Measurements - multi extremities 0 []  - Nutritional Assessment / Counseling / Intervention 0 []  - Lower Extremity Assessment (monofilament, tuning fork, pulses) 0 []  - Peripheral Arterial Disease Assessment (using hand held doppler) 0 ASSESSMENTS - Ostomy and/or Continence Assessment and Care []  - Incontinence Assessment and Management 0 []  - Ostomy Care Assessment and Management (repouching, etc.) 0 PROCESS - Coordination of Care X - Simple Patient / Family Education for ongoing care 1 15 []  - Complex (extensive) Patient / Family Education for ongoing care 0 X - Staff obtains Programmer, systems, Records, Test Results / Process Orders 1 10 []  - Staff telephones HHA, Nursing Homes / Clarify orders / etc 0 []  - Routine Transfer to another Facility (non-emergent condition) 0 [place], [place] ([id number]) []  - Routine Hospital Admission (non-emergent condition) 0 []  - New Admissions / Biomedical engineer / Ordering NPWT, Apligraf, etc. 0 []  - Emergency Hospital Admission (emergent condition) 0 X - Simple Discharge Coordination 1 10 []  - Complex (extensive) Discharge Coordination 0 PROCESS - Special Needs []  - Pediatric / Minor Patient Management 0 []  - Isolation Patient Management 0 []  - Hearing / Language / Visual special needs 0 []  - Assessment of Community assistance (transportation, D/C planning, etc.) 0 []  - Additional assistance / Altered mentation 0 []  - Support Surface(s) Assessment (bed, cushion, seat, etc.) 0 INTERVENTIONS - Wound  Cleansing / Measurement X - Simple Wound Cleansing - one wound 1 5 []  - Complex Wound Cleansing - multiple wounds 0 X - Wound Imaging (photographs - any number  of wounds) 1 5 []  - Wound Tracing (instead of photographs) 0 X - Simple Wound Measurement - one wound 1 5 []  - Complex Wound Measurement - multiple wounds 0 INTERVENTIONS - Wound Dressings X - Small Wound Dressing one or multiple wounds 1 10 []  - Medium Wound Dressing one or multiple wounds 0 []  - Large Wound Dressing one or multiple wounds 0 []  - Application of Medications - topical 0 []  - Application of Medications - injection 0 INTERVENTIONS - Miscellaneous []  - External ear exam 0 Yandow, Lyzbeth W. ([id number]) []  - Specimen Collection (cultures, biopsies, blood, body fluids, etc.) 0 []  - Specimen(s) / Culture(s) sent or taken to Lab for analysis 0 []  - Patient Transfer (multiple staff / Harrel Lemon Lift / Similar devices) 0 []  - Simple Staple / Suture removal (25 or less) 0 []  - Complex Staple / Suture removal (26 or more) 0 []  - Hypo / Hyperglycemic Management (close monitor of Blood Glucose) 0 []  - Ankle / Brachial Index (ABI) - do not check if billed separately 0 X - Vital Signs 1 5 Has the patient been seen at the hospital within the last three years: Yes Total Score: 85 Level Of Care: New/Established - Level 3 Electronic Signature(s) Signed: 03/17/2015 4:20:52 PM By: Rebecca Eaton, RN, Sendra Entered By: Rebecca Eaton RN, Sendra on 03/17/2015 09:37:48 [name] [name] ([id number]) -------------------------------------------------------------------------------- Encounter Discharge Information Details Patient Name: [name] [name] Date of Service: 03/17/2015 8:45 AM Medical Record Number: [id number] Patient Account Number: [id number] Date of Birth/Sex: [birth year] (64 y.o. Female) Treating RN: Macarthur Critchley Primary Care Physician: PATIENT, NO Other Clinician: Referring Physician: Treating Physician/Extender: Frann Rider in Treatment: 69 Encounter Discharge Information Items Discharge Pain Level: 0 Discharge Condition: Stable Ambulatory Status: Ambulatory Discharge Destination: Home Transportation: Private Auto Accompanied By: self Schedule Follow-up Appointment: Yes Medication Reconciliation completed and provided to Patient/Care Yes Marland Reine: Provided on Clinical Summary of Care: 03/17/2015 Form Type Recipient Paper Patient EG Electronic Signature(s) Signed: 03/17/2015 9:08:23 AM By: Ruthine Dose Entered By: Ruthine Dose on 03/17/2015 09:08:23 [name] [name] ([id number]) -------------------------------------------------------------------------------- Lower Extremity Assessment Details Patient Name: [name] [name] Date of Service: 03/17/2015 8:45 AM Medical Record Number: [id number] Patient Account Number: [id number] Date of Birth/Sex: 1950/07/13 (64 y.o. Female) Treating RN: Macarthur Critchley Primary Care Physician: PATIENT, NO Other Clinician: Referring Physician: Treating Physician/Extender: Frann Rider in Treatment: 35 Electronic Signature(s) Signed: 03/17/2015 4:20:52 PM By: Rebecca Eaton RN, Sendra Entered By: Rebecca Eaton RN, Sendra on 03/17/2015 08:56:03 [name] [name] ([id number]) -------------------------------------------------------------------------------- Multi Wound Chart Details Patient Name: [name] [name] Date of Service: 03/17/2015 8:45 AM Medical Record Number: [id number] Patient Account Number: [id number] Date of Birth/Sex: 03-11-1951 (64 y.o. 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] [N/A:N/A] Comorbid History: [1:Received Chemotherapy, Received Radiation] [N/A:N/A] Date Acquired: [1:05/10/2014] [N/A:N/A] Weeks of Treatment: [1:35] [N/A:N/A] Wound Status: [1:Open] [N/A:N/A] Measurements L x W x D 0.1x0.1x0.1 [N/A:N/A] (cm) Area (cm) : [1:0.008] [N/A:N/A] Volume (cm) : [1:0.001] [N/A:N/A] % Reduction in Area: [1:100.00%] [N/A:N/A] % Reduction in Volume: 100.00% [N/A:N/A] Classification: [1:Full Thickness Without Exposed Support Structures] [N/A:N/A] Exudate Amount: [1:Medium] [N/A:N/A] Exudate Type: [1:Serosanguineous] [N/A:N/A] Exudate Color: [1:red, brown] [N/A:N/A] Wound Margin: [1:Distinct, outline attached] [  N/A:N/A] Granulation Amount: [1:Large (67-100%)] [N/A:N/A] Granulation Quality: [1:Red] [N/A:N/A] Necrotic Amount: [1:Small (1-33%)] [N/A:N/A] Exposed Structures: [1:Fascia: No Fat: No Tendon: No Muscle: No Joint: No] [N/A:N/A] Bone: No Limited to Skin Breakdown Epithelialization: None N/A N/A Periwound Skin Texture: Induration: Yes N/A N/A Scarring: Yes Edema: No Excoriation: No Callus: No Crepitus: No Fluctuance: No Friable: No Rash: No Periwound Skin No Abnormalities Noted N/A N/A Moisture: Periwound Skin Color: No Abnormalities Noted N/A N/A Temperature: No Abnormality N/A N/A Tenderness on No N/A N/A Palpation: Wound Preparation: Ulcer Cleansing: N/A N/A Rinsed/Irrigated with Saline Treatment Notes Electronic Signature(s) Signed: 03/17/2015 4:20:52 PM By: Rebecca Eaton, RN, Sendra Entered By: Rebecca Eaton RN, Sendra on 03/17/2015 09:00:07 [name] [name] ([id number]) -------------------------------------------------------------------------------- [place] Details Patient Name: [name] [name] Date of Service: 03/17/2015 8:45 AM Medical Record Number: [id number] Patient Account Number:  [id number] Date of Birth/Sex: 03-31-1951 (64 y.o. Female) Treating RN: Macarthur Critchley Primary Care Physician: PATIENT, NO Other Clinician: Referring Physician: Treating Physician/Extender: Frann Rider in Treatment: 4 Active Inactive Abuse / Safety / Falls / Self Care Management Nursing Diagnoses: Potential for falls Goals: Patient will remain injury free Date Initiated: 07/11/2014 Goal Status: Active Interventions: Assess fall risk on admission and as needed Notes: Malignancy/Atypical Etiology Nursing Diagnoses: Knowledge deficit related to disease process and management of atypical ulcer etiology Goals: Patient/caregiver will verbalize understanding of disease process and disease management of atypical ulcer etiology Date Initiated: 07/11/2014 Goal Status: Active Interventions: Provide education on atypical ulcer etiologies Notes: Nutrition Nursing Diagnoses: Potential for alteratiion in Nutrition/Potential for imbalanced nutrition Goals: DRUCELLA, [facility] ([id number]) Patient/caregiver agrees to and verbalizes understanding of need to use nutritional supplements and/or vitamins as prescribed Date Initiated: 07/11/2014 Goal Status: Active Interventions: Assess patient nutrition upon admission and as needed per policy Notes: Orientation to the Wound Care Program Nursing Diagnoses: Knowledge deficit related to the wound healing center program Goals: Patient/caregiver will verbalize understanding of the [facility] Date Initiated: 07/11/2014 Goal Status: Active Interventions: Provide education on orientation to the wound center Notes: Wound/Skin Impairment Nursing Diagnoses: Impaired tissue integrity Goals: Patient will demonstrate a reduced rate of smoking or cessation of smoking Date Initiated: 07/11/2014 Goal Status: Active Patient will have a decrease in wound volume by X% from date: (specify in notes) Date Initiated: 07/11/2014 Goal  Status: Active Patient/caregiver will verbalize understanding of skin care regimen Date Initiated: 07/11/2014 Goal Status: Active Ulcer/skin breakdown will have a volume reduction of 30% by week 4 Date Initiated: 07/11/2014 Goal Status: Active Ulcer/skin breakdown will have a volume reduction of 50% by week 8 Date Initiated: 07/11/2014 Goal Status: Active Ulcer/skin breakdown will have a volume reduction of 80% by week 12 KAINOA, HEINKEL ([id number]) Date Initiated: 07/11/2014 Goal Status: Active Ulcer/skin breakdown will heal within 14 weeks Date Initiated: 07/11/2014 Goal Status: Active Interventions: Assess patient/caregiver ability to obtain necessary supplies Assess patient/caregiver ability to perform ulcer/skin care regimen upon admission and as needed Assess ulceration(s) every visit Provide education on smoking Provide education on ulcer and skin care Screen for HBO Notes: Electronic Signature(s) Signed: 03/17/2015 4:20:52 PM By: Rebecca Eaton, RN, Sendra Entered By: Rebecca Eaton RN, Sendra on 03/17/2015 09:00:00 [name] [name] ([id number]) -------------------------------------------------------------------------------- Pain Assessment Details Patient Name: [name] [name] Date of Service: 03/17/2015 8:45 AM Medical Record Number: [id number] Patient Account Number: [id number] Date of Birth/Sex: Aug 21, 1950 (64 y.o. Female) Treating RN: Macarthur Critchley Primary Care Physician: PATIENT, NO Other Clinician: Referring Physician: Treating Physician/Extender: Frann Rider in Treatment: 35 Active Problems Location  of Pain Severity and Description of Pain Patient Has Paino No Site Locations Rate the pain. Current Pain Level: 0 Pain Management and Medication Current Pain Management: Electronic Signature(s) Signed: 03/17/2015 4:20:52 PM By: Rebecca Eaton, RN, Sendra Entered By: Rebecca Eaton RN, Sendra on 03/17/2015 08:53:07 [name] [name]  ([id number]) -------------------------------------------------------------------------------- Patient/Caregiver Education Details Patient Name: [name] [name] Date of Service: 03/17/2015 8:45 AM Medical Record Number: [id number] Patient Account Number: [id number] Date of Birth/Gender: [birth year] (64 y.o. Female) Treating RN: Macarthur Critchley Primary Care Physician: PATIENT, NO Other Clinician: Referring Physician: Treating Physician/Extender: Frann Rider in Treatment: 35 Education Assessment Education Provided To: Patient Education Topics Provided Wound/Skin Impairment: Handouts: Skin Care Do's and Dont's, Smoking and Wound Healing Methods: Explain/Verbal Responses: State content correctly Electronic Signature(s) Signed: 03/17/2015 4:20:52 PM By: Rebecca Eaton, RN, Sendra Entered By: Rebecca Eaton RN, Sendra on 03/17/2015 09:00:29 [name] [name] ([id number]) -------------------------------------------------------------------------------- Wound Assessment Details Patient Name: [name] [name] Date of Service: 03/17/2015 8:45 AM Medical Record Number: [id number] Patient Account Number: [id number] Date of Birth/Sex: [birth year] (64 y.o. Female) Treating RN: Macarthur Critchley Primary Care Physician: PATIENT, NO Other Clinician: Referring Physician: Treating Physician/Extender: Frann Rider in Treatment: 35 Wound Status Wound Number: 1 Primary Malignant Wound Etiology: Wound Location: Left Chest Wound Status: Open Wounding Event: Other Lesion Comorbid Received Chemotherapy, Received Date Acquired: 05/10/2014 History: Radiation Weeks Of Treatment: 35 Clustered Wound: No Photos Wound Measurements Length: (cm) 1.1 Width: (cm) 0.3 Depth: (cm) 0.1 Area: (cm) 0.259 Volume: (cm) 0.026 % Reduction in Area: 98.9% % Reduction in Volume: 99.4% Epithelialization: None Tunneling: No Undermining: No Wound Description Full Thickness Without  Exposed Classification: Support Structures Wound Margin: Distinct, outline attached Exudate Medium Amount: Exudate Type: Serosanguineous Exudate Color: red, brown Foul Odor After Cleansing: No Wound Bed Granulation Amount: Large (67-100%) Exposed Structure Granulation Quality: Red Fascia Exposed: No Necrotic Amount: Small (1-33%) Fat Layer Exposed: No Fielder, Yana W. ([id number]) Necrotic Quality: Adherent Slough Tendon Exposed: No Muscle Exposed: No Joint Exposed: No Bone Exposed: No Limited to Skin Breakdown Periwound Skin Texture Texture Color No Abnormalities Noted: No No Abnormalities Noted: Yes Callus: No Temperature / Pain Crepitus: No Temperature: No Abnormality Excoriation: No Fluctuance: No Friable: No Induration: Yes Localized Edema: No Rash: No Scarring: Yes Moisture No Abnormalities Noted: Yes Wound Preparation Ulcer Cleansing: Rinsed/Irrigated with Saline Treatment Notes Wound #1 (Left Chest) 1. Cleansed with: Clean wound with Normal Saline 4. Dressing Applied: Aquacel Ag 5. Secondary Dressing Applied Bordered Foam Dressing Electronic Signature(s) Signed: 03/18/2015 5:20:52 PM By: Regan Lemming BSN, RN Signed: 04/10/2015 4:17:44 PM By: Rebecca Eaton RN, Sendra Previous Signature: 03/17/2015 4:20:52 PM Version By: Rebecca Eaton RN, Sendra Entered By: Regan Lemming on 03/18/2015 17:20:52 [name] [name] ([id number]) -------------------------------------------------------------------------------- Vitals Details Patient Name: [name] [name] Date of Service: 03/17/2015 8:45 AM Medical Record Number: [id number] Patient Account Number: [id number] Date of Birth/Sex: Sep 04, 1950 (64 y.o. Female) Treating RN: Macarthur Critchley Primary Care Physician: PATIENT, NO Other Clinician: Referring Physician: Treating Physician/Extender: Frann Rider in Treatment: 35 Vital Signs Time Taken: 08:53 Temperature (F): 98.2 Height (in): 68 Pulse (bpm):  87 Weight (lbs): 180.5 Respiratory Rate (breaths/min): 16 Body Mass Index (BMI): 27.4 Blood Pressure (mmHg): 102/67 Reference Range: 80 - 120 mg / dl Pulse Oximetry (%): 100 Electronic Signature(s) Signed: 03/17/2015 4:20:52 PM By: Rebecca Eaton RN, Sendra Entered By: Rebecca Eaton RN, Sendra on 03/17/2015 08:55:50

## 2015-03-17 NOTE — Progress Notes (Addendum)
[name], [name] ([id number]) Visit Report for 03/17/2015 Chief Complaint Document Details Patient Name: [name] [name], [name] [name] 03/17/2015 8:45 Date of Service: AM Medical Record [id number] Number: Patient Account Number: [id number] 1950-10-16 (64 y.o. Treating RN: Macarthur Critchley Date of Birth/Sex: Female) Other Clinician: Primary Care Physician: PATIENT, NO Treating Halima Fogal Referring Physician: Physician/Extender: Suella Grove in Treatment: 35 Information Obtained from: Patient Chief Complaint Patient presents to the wound care center for a consult due non healing wound patient is a self-referral who comes with a history of having open wounds to her left chest wall and right chest wall for about 2 months. 07/18/2014 -- since last week the patient has had no complaints and is on her last cycle of chemotherapy. I have reviewed 155 pages of her reports from a medical oncologist and the summary is made in the history of present illness. Electronic Signature(s) Signed: 03/17/2015 8:55:59 AM By: Christin Fudge MD, FACS Entered By: Christin Fudge on 03/17/2015 08:55:59 [name] [name] ([id number]) -------------------------------------------------------------------------------- HPI Details Patient Name: [name], [name] 03/17/2015 8:45 Date of Service: AM Medical Record [id number] Number: Patient Account Number: [id number] Nov 16, 1950 (64 y.o. Treating RN: Macarthur Critchley Date of Birth/Sex: Female) Other Clinician: Primary Care Physician: PATIENT, NO Treating Deionna Marcantonio Referring Physician: Physician/Extender: Suella Grove in Treatment: 82 History of Present Illness HPI Description: this 64 year old patient has come by herself today as a self-referral and has no documentation with her. She has had open wounds to her left chest and the right chest wall for about 2 months. She is not a diabetic and has no significant medical history except breast cancer. Her right breast cancer was  diagnosed 18 years ago and she had a mastectomy and chemotherapy. 3 years ago she was then diagnosed with a breast cancer of the left side which was advanced and could not be operated and was given chemotherapy for a year and then followed by radiation. Her last radiation was over a year ago. She has not had any biopsy done recently from the ulcerated area but she says there have been other biopsies done from the chest wall and these reports are not available at the present time. She was told to keep the wounds open and let them dry out but she is finding it's more and more difficult to stop soiling her clothes if she leaves wounds open. She is on chemotherapy and we will try and obtain these notes from her oncologist. She is not in pain and does not have any significant problems except the discomfort from an open wound on the chest wall. 07/18/2014 This is a summary of [name] [name] date of birth Jul 12, 1950. These are obtained by reviewing 151 pages of medical records which were obtained from the [facility] 64 year old patient who had a history of stage I right breast cancer status post modified radical mastectomy on 06/12/1996. 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 She received 4 cycles of AC at [facility]. In January 2014 she presented with left breast swelling and apparently cellulitis. Breast biopsy done revealed lobular carcinoma which was ER and PR positive and HER-2/neu negative. On PET scan and she was shown to have multiple areas of disease left breast, axilla and supraclavicular areas. She received 6 cycles of chemotherapy and then received Femara because she was not a surgical candidate. PET scan also revealed disease in the left lateral breast and new cervical lymph nodes at level II. The patient was then sent for radiation  therapy and received this over a month. In January 2016 she had a PET CT scan done which  was compatible with marked progression of disease with multifocal soft tissue masses in the chest wall left greater than right. She is currently receiving Xeloda and is noted to have disease in the chest wall which is widely present. Her most recent lab work from 07/02/2014 shows that her CMP is within normal limits with a serum albumin of 4.4. The previous CBC was within normal limits with a WBC count of 4.3 hemoglobin of 14 hematocrit of 40.9 and platelets of 202. 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 We will continue with palliative care and make her comfortable as much as possible as far as her wound care goes. Present time there is no odor but if this does occur we would use carbon- based products to mask the odor. [name] [name], [name] ([id number]) Notes were also reviewed from her surgeon Dr. Jeffrey Byrnett --closure on 62/15/2016 for breast biopsy in view of the fact that she had extensive disease on her chest wall. Biopsies are taken from the nodular metastatic disease overlying the sternum and nodules chosen for biopsy. Punch biopsies were taken and the pathology on this confirmed that this was consistent with carcinoma 08/15/2014 -- she is started on a new course of injectable chemotherapy and this is 2 weeks on and 2 weeks off. She has no new symptoms no bleeding from the wounds nor is there any odor. She is doing fine otherwise. 09/16/2014. She is doing very well and the right side wounds have completely healed. She has finished 2 cycles of chemotherapy and she is 2 weeks on and 2 weeks off. He is to restart her chemotherapy this week. No fresh complaints pain is within control and there is no odor from the wounds. 10/21/2014 -- overall she is doing very well and is on a clinical  trial with her medical oncologist. No issues with the wound on her left side and there is no odor or drainage. 11/25/2014 -- she continues to be under clinical trial protocol with the medical oncologist and has no fresh issues. 02/17/2015 -- she has been taken off the clinical trial protocol and now is on Taxol on a regular basis with her medical oncologists Dr. Cochran. Electronic Signature(s) Signed: 03/17/2015 8:56:08 AM By: Britto, Errol MD, FACS Entered By: Britto, Errol on 03/17/2015 08:56:08 Abbruzzese, Alley W. ([id number]) -------------------------------------------------------------------------------- Physical Exam Details Patient Name: [name] [name], [name] 03/17/2015 8:45 Date of Service: AM Medical Record [id number] Number: Patient Account Number: [id number] 03/01/1951 (64 y.o. Treating RN: Roseboro, Sendra Date of Birth/Sex: Female) Other Clinician: Primary Care Physician: PATIENT, NO Treating Britto, Errol Referring Physician: Physician/Extender: Weeks in Treatment: 35 Constitutional . Pulse regular. Respirations normal and unlabored. Afebrile. . Eyes Nonicteric. Reactive to light. Ears, Nose, Mouth, and Throat Lips, teeth, and gums WNL.. Moist mucosa without lesions . Neck supple and nontender. No palpable supraclavicular or cervical adenopathy. Normal sized without goiter. Respiratory WNL. No retractions.. Breath sounds WNL, No rubs, rales, rhonchi, or wheeze.. Cardiovascular Heart rhythm and rate regular, no murmur or gallop.. Pedal Pulses WNL. No clubbing, cyanosis or edema. Chest The right breast does not have any open ulcer and the chest wall has completely healed. On the left breast she has an area   which is indurated and on the lateral chest wall and axilla she has two very tiny ulcer which is still open.. Breast tissue WNL, no masses, lumps, or tenderness.. Lymphatic No adneopathy. No adenopathy. No adenopathy. Musculoskeletal Adexa without tenderness or  enlargement.. Digits and nails w/o clubbing, cyanosis, infection, petechiae, ischemia, or inflammatory conditions.. Integumentary (Hair, Skin) No suspicious lesions. No crepitus or fluctuance. No peri-wound warmth or erythema. No masses.Marland Kitchen Psychiatric Judgement and insight Intact.. No evidence of depression, anxiety, or agitation.. Notes The right breast does not have any open ulcer and the chest wall has completely healed. On the left breast she has an area which is indurated and on the lateral chest wall and axilla she has two very tiny ulcer which is still open. Electronic Signature(s) Signed: 03/17/2015 9:05:31 AM By: Christin Fudge MD, FACS Previous Signature: 03/17/2015 8:57:48 AM Version By: Christin Fudge MD, FACS [name] [name] ([id number]) Entered By: Christin Fudge on 03/17/2015 09:05:30 [name] [name], [name] [name] ([id number]) -------------------------------------------------------------------------------- Physician Orders Details Patient Name: [name] [name], [name] [name] 03/17/2015 8:45 Date of Service: AM Medical Record [id number] Number: Patient Account Number: [id number] 06/08/1950 (64 y.o. Treating RN: Macarthur Critchley Date of Birth/Sex: Female) Other Clinician: Primary Care Physician: PATIENT, NO Treating Pookela Sellin Referring Physician: Physician/Extender: Suella Grove in Treatment: 68 Verbal / Phone Orders: Yes Clinician: Macarthur Critchley Read Back and Verified: No Diagnosis Coding ICD-10 Coding Code Description C50.812 Malignant neoplasm of overlapping sites of left female breast S21.001A Unspecified open wound of right breast, initial encounter C50.412 Malignant neoplasm of upper-outer quadrant of left female breast S21.002A Unspecified open wound of left breast, initial encounter Wound Cleansing Wound #1 Left Chest o Clean wound with Normal Saline. Primary Wound Dressing Wound #1 Left Chest o Aquacel Ag Secondary Dressing Wound #1 Left Chest o Boardered Foam  Dressing Dressing Change Frequency Wound #1 Left Chest o Change dressing every other day. - or more if needed Follow-up Appointments Wound #1 Left Chest o Return Appointment in 1 month Electronic Signature(s) Signed: 03/17/2015 4:20:52 PM By: Ardean Larsen Signed: 03/17/2015 4:22:10 PM By: Christin Fudge MD, FACS Entered By: Rebecca Eaton RN, Sendra on 03/17/2015 09:04:26 [name] [name], [name] [name] ([id number]) [name] [name], [name] [name] ([id number]) -------------------------------------------------------------------------------- Problem List Details Patient Name: [name] [name], [name] [name] 03/17/2015 8:45 Date of Service: AM Medical Record [id number] Number: Patient Account Number: [id number] 1950-09-13 (64 y.o. Treating RN: Macarthur Critchley Date of Birth/Sex: Female) Other Clinician: Primary Care Physician: PATIENT, NO Treating Margareta Laureano Referring Physician: Physician/Extender: Suella Grove in Treatment: 35 Active Problems ICD-10 Encounter Code Description Active Date Diagnosis C50.812 Malignant neoplasm of overlapping sites of left female 07/11/2014 Yes breast S21.001A Unspecified open wound of right breast, initial encounter 07/11/2014 Yes C50.412 Malignant neoplasm of upper-outer quadrant of left female 07/11/2014 Yes breast S21.002A Unspecified open wound of left breast, initial encounter 07/11/2014 Yes Inactive Problems Resolved Problems Electronic Signature(s) Signed: 03/17/2015 8:55:36 AM By: Christin Fudge MD, FACS Entered By: Christin Fudge on 03/17/2015 08:55:36 [name] [name] ([id number]) -------------------------------------------------------------------------------- Progress Note Details Patient Name: [name] [name]. 03/17/2015 8:45 Date of Service: AM Medical Record [id number] Number: Patient Account Number: [id number] [birth year] (64 y.o. 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 07/18/2014 -- since last week the patient has had no complaints and is on her last cycle of chemotherapy. I have reviewed 155 pages of her reports from a medical oncologist and the summary is made in the history of present illness. History of Present Illness (HPI) this 63-year-old patient has come by herself today as a self-referral and has no documentation with her. She has had open wounds to her left chest and the right chest wall for about 2 months. She is not a diabetic and has no significant medical history except breast cancer. Her right breast cancer was diagnosed 18 years ago and she had a mastectomy and chemotherapy. 3 years ago she was then diagnosed with a breast cancer of the left side which was advanced and could not be operated and was given chemotherapy for a year and then followed by radiation. Her last radiation was over a year ago. She has not had any biopsy done recently from the ulcerated area but she says there have been other biopsies done from the chest wall and these reports are not available at the present time. She was told to keep the wounds open and let them dry out but she is finding it's more and more difficult to stop soiling her clothes if she leaves wounds open. She is on chemotherapy and we will try and obtain these notes from her oncologist. She is not in pain and does not have any significant problems except the discomfort from an open wound on the chest wall. 07/18/2014 This is a summary of [name] [name] date of birth 12/16/1950. These are obtained by reviewing 151 pages of medical records which were obtained  from the [facility] 63-year-old patient who had a history of stage I right breast cancer status post modified radical mastectomy on 06/12/1996. 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 She received 4 cycles of AC at [facility]. In January 2014 she presented with left breast swelling and apparently cellulitis. Breast biopsy done revealed lobular carcinoma which was ER and PR positive and HER-2/neu negative. On PET scan and she was shown to have multiple areas of disease left breast, axilla and supraclavicular areas. She received 6 cycles of chemotherapy and then received Femara because she was not a surgical candidate. PET scan also revealed disease in the left lateral breast and new cervical lymph nodes at level II. The patient was then sent for radiation therapy and received this over a month. In January 2016 she had a PET CT scan done which was compatible with marked progression of disease with multifocal soft tissue masses in the chest wall left greater than right. [name] [name], [name] ([id number]) She is currently receiving Xeloda and is noted to have disease in the chest wall which is widely present. Her most recent lab work from 07/02/2014 shows that her CMP is within normal limits with a serum albumin of 4.4. The previous CBC was within normal limits with a WBC count of 4.3 hemoglobin of 14 hematocrit of 40.9 and platelets of 202. 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 We will continue with palliative care and make her comfortable as much as possible as far as her wound care goes. Present time there is no odor but   if this does occur we would use carbon- based products to mask the odor. Notes were also reviewed from her surgeon  Dr. Hervey Ard --closure on 62/15/2016 for breast biopsy in view of the fact that she had extensive disease on her chest wall. Biopsies are taken from the nodular metastatic disease overlying the sternum and nodules chosen for biopsy. Punch biopsies were taken and the pathology on this confirmed that this was consistent with carcinoma 08/15/2014 -- she is started on a new course of injectable chemotherapy and this is 2 weeks on and 2 weeks off. She has no new symptoms no bleeding from the wounds nor is there any odor. She is doing fine otherwise. 09/16/2014. She is doing very well and the right side wounds have completely healed. She has finished 2 cycles of chemotherapy and she is 2 weeks on and 2 weeks off. He is to restart her chemotherapy this week. No fresh complaints pain is within control and there is no odor from the wounds. 10/21/2014 -- overall she is doing very well and is on a clinical trial with her medical oncologist. No issues with the wound on her left side and there is no odor or drainage. 11/25/2014 -- she continues to be under clinical trial protocol with the medical oncologist and has no fresh issues. 02/17/2015 -- she has been taken off the clinical trial protocol and now is on Taxol on a regular basis with her medical oncologists Dr. Susy Manor. Objective Constitutional Pulse regular. Respirations normal and unlabored. Afebrile. Vitals Time Taken: 8:53 AM, Height: 68 in, Weight: 180.5 lbs, BMI: 27.4, Temperature: 98.2 F, Pulse: 87 bpm, Respiratory Rate: 16 breaths/min, Blood Pressure: 102/67 mmHg, Pulse Oximetry: 100 %. Eyes Nonicteric. Reactive to light. Ears, Nose, Mouth, and Throat Lips, teeth, and gums WNL.Marland Kitchen Moist mucosa without lesions . Neck Manfre, Arnesha W. ([id number]) supple and nontender. No palpable supraclavicular or cervical adenopathy. Normal sized without goiter. Respiratory WNL. No retractions.. Breath sounds WNL, No rubs, rales, rhonchi, or  wheeze.. Cardiovascular Heart rhythm and rate regular, no murmur or gallop.. Pedal Pulses WNL. No clubbing, cyanosis or edema. Chest The right breast does not have any open ulcer and the chest wall has completely healed. On the left breast she has an area which is indurated and on the lateral chest wall and axilla she has two very tiny ulcer which is still open.. Breast tissue WNL, no masses, lumps, or tenderness.. Lymphatic No adneopathy. No adenopathy. No adenopathy. Musculoskeletal Adexa without tenderness or enlargement.. Digits and nails w/o clubbing, cyanosis, infection, petechiae, ischemia, or inflammatory conditions.Marland Kitchen Psychiatric Judgement and insight Intact.. No evidence of depression, anxiety, or agitation.. General Notes: The right breast does not have any open ulcer and the chest wall has completely healed. On the left breast she has an area which is indurated and on the lateral chest wall and axilla she has two very tiny ulcer which is still open. Integumentary (Hair, Skin) No suspicious lesions. No crepitus or fluctuance. No peri-wound warmth or erythema. No masses.. Wound #1 status is Open. Original cause of wound was Other Lesion. The wound is located on the Left Chest. The wound measures 1.1cm length x 0.3cm width x 0.1cm depth; 0.259cm^2 area and 0.026cm^3 volume. The wound is limited to skin breakdown. There is no tunneling or undermining noted. There is a medium amount of serosanguineous drainage noted. The wound margin is distinct with the outline attached to the wound base. There is large (67-100%) red granulation within the wound bed. There  is a small (1-33%) amount of necrotic tissue within the wound bed including Adherent Slough. The periwound skin appearance had no abnormalities noted for moisture. The periwound skin appearance had no abnormalities noted for color. The periwound skin appearance exhibited: Induration, Scarring. The periwound skin appearance did not  exhibit: Callus, Crepitus, Excoriation, Fluctuance, Friable, Localized Edema, Rash. Periwound temperature was noted as No Abnormality. Assessment Active Problems ICD-10 Kishimoto, Rayden W. ([id number]) C50.812 - Malignant neoplasm of overlapping sites of left female breast S21.001A - Unspecified open wound of right breast, initial encounter C50.412 - Malignant neoplasm of upper-outer quadrant of left female breast S21.002A - Unspecified open wound of left breast, initial encounter Plan Wound Cleansing: Wound #1 Left Chest: Clean wound with Normal Saline. Primary Wound Dressing: Wound #1 Left Chest: Aquacel Ag Secondary Dressing: Wound #1 Left Chest: Boardered Foam Dressing Dressing Change Frequency: Wound #1 Left Chest: Change dressing every other day. - or more if needed Follow-up Appointments: Wound #1 Left Chest: Return Appointment in 1 month At this stage all she will need is a small piece of Aquacel Ag and a large Band-Aid over her open wound. She will follow up with as as needed. Electronic Signature(s) Signed: 03/19/2015 3:52:18 PM By: Britto, Errol MD, FACS Previous Signature: 03/17/2015 9:05:59 AM Version By: Britto, Errol MD, FACS Entered By: Britto, Errol on 03/19/2015 15:42:15 Urwin, Phillip W. ([id number]) -------------------------------------------------------------------------------- SuperBill Details Patient Name: Taha, Clay W. Date of Service: 03/17/2015 Medical Record Number: [id number] Patient Account Number: [id number] Date of Birth/Sex: 09/22/1950 (64 y.o. Female) Treating RN: [place], Sendra Primary Care Physician: PATIENT, NO Other Clinician: Referring Physician: Treating Physician/Extender: Britto, Errol Weeks in Treatment: 35 Diagnosis Coding ICD-10 Codes Code Description C50.812 Malignant neoplasm of overlapping sites of left female breast S21.001A Unspecified open wound of right breast, initial encounter C50.412 Malignant neoplasm of  upper-outer quadrant of left female breast S21.002A Unspecified open wound of left breast, initial encounter Facility Procedures CPT4 Code: [id number] Description: 99213 - WOUND CARE VISIT-LEV 3 EST PT Modifier: Quantity: 1 Physician Procedures CPT4 Code Description: [id number] 99213 - WC PHYS LEVEL 3 - EST PT ICD-10 Description Diagnosis C50.812 Malignant neoplasm of overlapping sites of left C50.412 Malignant neoplasm of upper-outer quadrant of le S21.001A Unspecified open wound of right breast,  initial Modifier: female breast ft female brea encounter Quantity: 1 st Electronic Signature(s) Signed: 03/17/2015 4:20:52 PM By: [place], RN, Sendra Signed: 03/17/2015 4:22:10 PM By: Britto, Errol MD, FACS Previous Signature: 03/17/2015 9:06:15 AM Version By: Britto, Errol MD, FACS Entered By: [place], RN, Sendra on 03/17/2015 09:38:08 

## 2015-03-22 ENCOUNTER — Other Ambulatory Visit: Payer: Self-pay | Admitting: Hematology and Oncology

## 2015-03-24 ENCOUNTER — Inpatient Hospital Stay: Payer: Medicare Other

## 2015-03-24 ENCOUNTER — Ambulatory Visit: Payer: Medicare Other | Admitting: Hematology and Oncology

## 2015-03-24 ENCOUNTER — Telehealth: Payer: Self-pay

## 2015-03-24 VITALS — BP 113/75 | HR 72 | Temp 95.9°F | Resp 20

## 2015-03-24 DIAGNOSIS — C50912 Malignant neoplasm of unspecified site of left female breast: Secondary | ICD-10-CM

## 2015-03-24 DIAGNOSIS — C792 Secondary malignant neoplasm of skin: Principal | ICD-10-CM

## 2015-03-24 DIAGNOSIS — Z5111 Encounter for antineoplastic chemotherapy: Secondary | ICD-10-CM | POA: Diagnosis not present

## 2015-03-24 LAB — COMPREHENSIVE METABOLIC PANEL
ALT: 34 U/L (ref 14–54)
AST: 27 U/L (ref 15–41)
Albumin: 3.8 g/dL (ref 3.5–5.0)
Alkaline Phosphatase: 128 U/L — ABNORMAL HIGH (ref 38–126)
Anion gap: 6 (ref 5–15)
BUN: 16 mg/dL (ref 6–20)
CO2: 25 mmol/L (ref 22–32)
Calcium: 8.6 mg/dL — ABNORMAL LOW (ref 8.9–10.3)
Chloride: 103 mmol/L (ref 101–111)
Creatinine, Ser: 0.84 mg/dL (ref 0.44–1.00)
GFR calc Af Amer: >60 mL/min (ref >60)
GFR calc non Af Amer: >60 mL/min (ref >60)
Glucose, Bld: 119 mg/dL — ABNORMAL HIGH (ref 65–99)
Potassium: 3.4 mmol/L — ABNORMAL LOW (ref 3.5–5.1)
Sodium: 134 mmol/L — ABNORMAL LOW (ref 135–145)
Total Bilirubin: 0.2 mg/dL — ABNORMAL LOW (ref 0.3–1.2)
Total Protein: 7 g/dL (ref 6.5–8.1)

## 2015-03-24 LAB — CBC WITH DIFFERENTIAL/PLATELET
Basophils Absolute: 0 10*3/uL (ref 0–0.1)
Basophils Relative: 1 %
Eosinophils Absolute: 0.1 10*3/uL (ref 0–0.7)
Eosinophils Relative: 2 %
HCT: 35.7 % (ref 35.0–47.0)
Hemoglobin: 11.9 g/dL — ABNORMAL LOW (ref 12.0–16.0)
Lymphocytes Relative: 33 %
Lymphs Abs: 0.9 10*3/uL — ABNORMAL LOW (ref 1.0–3.6)
MCH: 28.5 pg (ref 26.0–34.0)
MCHC: 33.2 g/dL (ref 32.0–36.0)
MCV: 85.9 fL (ref 80.0–100.0)
Monocytes Absolute: 0.3 10*3/uL (ref 0.2–0.9)
Monocytes Relative: 11 %
Neutro Abs: 1.5 10*3/uL (ref 1.4–6.5)
Neutrophils Relative %: 53 %
Platelets: 227 10*3/uL (ref 150–440)
RBC: 4.16 MIL/uL (ref 3.80–5.20)
RDW: 18.8 % — ABNORMAL HIGH (ref 11.5–14.5)
WBC: 2.8 10*3/uL — ABNORMAL LOW (ref 3.6–11.0)

## 2015-03-24 LAB — MAGNESIUM: Magnesium: 2.1 mg/dL (ref 1.7–2.4)

## 2015-03-24 MED ORDER — SODIUM CHLORIDE 0.9 % IJ SOLN
10.0000 mL | INTRAMUSCULAR | Status: DC | PRN
  Administered 2015-03-24: 10 mL
  Filled 2015-03-24: qty 10

## 2015-03-24 MED ORDER — SODIUM CHLORIDE 0.9 % IV SOLN
Freq: Once | INTRAVENOUS | Status: AC
  Administered 2015-03-24: 11:00:00 via INTRAVENOUS
  Filled 2015-03-24: qty 1000

## 2015-03-24 MED ORDER — PACLITAXEL CHEMO INJECTION 300 MG/50ML
80.0000 mg/m2 | Freq: Once | INTRAVENOUS | Status: AC
  Administered 2015-03-24: 150 mg via INTRAVENOUS
  Filled 2015-03-24: qty 25

## 2015-03-24 MED ORDER — HEPARIN SOD (PORK) LOCK FLUSH 100 UNIT/ML IV SOLN
500.0000 [IU] | Freq: Once | INTRAVENOUS | Status: AC | PRN
  Administered 2015-03-24: 500 [IU]
  Filled 2015-03-24: qty 5

## 2015-03-24 MED ORDER — SODIUM CHLORIDE 0.9 % IV SOLN
Freq: Once | INTRAVENOUS | Status: AC
  Administered 2015-03-24: 11:00:00 via INTRAVENOUS
  Filled 2015-03-24: qty 4

## 2015-03-24 MED ORDER — DIPHENHYDRAMINE HCL 50 MG/ML IJ SOLN
50.0000 mg | Freq: Once | INTRAMUSCULAR | Status: AC
  Administered 2015-03-24: 50 mg via INTRAVENOUS
  Filled 2015-03-24: qty 1

## 2015-03-24 MED ORDER — FAMOTIDINE IN NACL 20-0.9 MG/50ML-% IV SOLN
20.0000 mg | Freq: Once | INTRAVENOUS | Status: AC
  Administered 2015-03-24: 20 mg via INTRAVENOUS
  Filled 2015-03-24: qty 50

## 2015-03-24 NOTE — Telephone Encounter (Signed)
Called pt per MD to tell her about potassium of 3.4.  Pt verbalized she is taking 10 meq potassium daily.  Pt verbalized an understanding of being low.

## 2015-03-25 ENCOUNTER — Telehealth: Payer: Self-pay

## 2015-03-25 NOTE — Telephone Encounter (Signed)
Called pt per MD and asked pt to take 10 meq BID for 2 days then back to 10 meq a day.  Pt verbalized an understanding.

## 2015-03-27 ENCOUNTER — Other Ambulatory Visit: Payer: Self-pay

## 2015-03-27 DIAGNOSIS — C792 Secondary malignant neoplasm of skin: Principal | ICD-10-CM

## 2015-03-27 DIAGNOSIS — C50912 Malignant neoplasm of unspecified site of left female breast: Secondary | ICD-10-CM

## 2015-03-28 ENCOUNTER — Telehealth: Payer: Self-pay | Admitting: *Deleted

## 2015-03-28 NOTE — Telephone Encounter (Signed)
  Please let patient know what pharmacy said. We will be checking her potassium at follow-up visits to confirm.  M

## 2015-03-28 NOTE — Telephone Encounter (Signed)
Call returned to pt and she was informed of what the pharmacist has said and she was also informed that we will be monitoring her potassium level

## 2015-03-28 NOTE — Telephone Encounter (Addendum)
Called to report that her potassium tablets are coming out in her feces whole and asking if she is just not absorbing it. I called her Pharamcy and spoke with pharmacist who said that sometimes this can happen that the shell comes out whole and that the medication should have absorbed from it

## 2015-04-02 ENCOUNTER — Other Ambulatory Visit: Payer: Self-pay | Admitting: Hematology and Oncology

## 2015-04-03 ENCOUNTER — Ambulatory Visit
Admission: RE | Admit: 2015-04-03 | Discharge: 2015-04-03 | Disposition: A | Discharge status: Home / Self Care | Payer: Medicare Other | Source: Ambulatory Visit | Attending: Hematology and Oncology | Admitting: Hematology and Oncology

## 2015-04-03 DIAGNOSIS — C792 Secondary malignant neoplasm of skin: Secondary | ICD-10-CM | POA: Insufficient documentation

## 2015-04-03 DIAGNOSIS — Z9221 Personal history of antineoplastic chemotherapy: Secondary | ICD-10-CM | POA: Insufficient documentation

## 2015-04-03 DIAGNOSIS — C50912 Malignant neoplasm of unspecified site of left female breast: Secondary | ICD-10-CM | POA: Diagnosis present

## 2015-04-03 MED ORDER — IOHEXOL 300 MG/ML  SOLN
85.0000 mL | Freq: Once | INTRAMUSCULAR | Status: AC | PRN
  Administered 2015-04-03: 85 mL via INTRAVENOUS

## 2015-04-07 ENCOUNTER — Other Ambulatory Visit: Payer: Self-pay | Admitting: Hematology and Oncology

## 2015-04-07 ENCOUNTER — Encounter: Payer: Self-pay | Admitting: Hematology and Oncology

## 2015-04-08 ENCOUNTER — Inpatient Hospital Stay (HOSPITAL_BASED_OUTPATIENT_CLINIC_OR_DEPARTMENT_OTHER): Payer: Medicare Other | Admitting: Hematology and Oncology

## 2015-04-08 ENCOUNTER — Inpatient Hospital Stay: Payer: Medicare Other

## 2015-04-08 VITALS — BP 137/87 | HR 92 | Temp 96.8°F | Resp 18 | Ht 67.5 in | Wt 167.8 lb

## 2015-04-08 DIAGNOSIS — C50912 Malignant neoplasm of unspecified site of left female breast: Secondary | ICD-10-CM

## 2015-04-08 DIAGNOSIS — Z170 Estrogen receptor positive status [ER+]: Secondary | ICD-10-CM | POA: Diagnosis not present

## 2015-04-08 DIAGNOSIS — Z79811 Long term (current) use of aromatase inhibitors: Secondary | ICD-10-CM

## 2015-04-08 DIAGNOSIS — F17210 Nicotine dependence, cigarettes, uncomplicated: Secondary | ICD-10-CM

## 2015-04-08 DIAGNOSIS — C792 Secondary malignant neoplasm of skin: Principal | ICD-10-CM

## 2015-04-08 DIAGNOSIS — G629 Polyneuropathy, unspecified: Secondary | ICD-10-CM

## 2015-04-08 DIAGNOSIS — Z5111 Encounter for antineoplastic chemotherapy: Secondary | ICD-10-CM | POA: Diagnosis not present

## 2015-04-08 DIAGNOSIS — Z79899 Other long term (current) drug therapy: Secondary | ICD-10-CM

## 2015-04-08 DIAGNOSIS — Z853 Personal history of malignant neoplasm of breast: Secondary | ICD-10-CM

## 2015-04-08 LAB — COMPREHENSIVE METABOLIC PANEL
ALT: 31 U/L (ref 14–54)
AST: 33 U/L (ref 15–41)
Albumin: 4 g/dL (ref 3.5–5.0)
Alkaline Phosphatase: 147 U/L — ABNORMAL HIGH (ref 38–126)
Anion gap: 7 (ref 5–15)
BUN: 16 mg/dL (ref 6–20)
CO2: 26 mmol/L (ref 22–32)
Calcium: 9.2 mg/dL (ref 8.9–10.3)
Chloride: 103 mmol/L (ref 101–111)
Creatinine, Ser: 0.72 mg/dL (ref 0.44–1.00)
GFR calc Af Amer: >60 mL/min (ref >60)
GFR calc non Af Amer: >60 mL/min (ref >60)
Glucose, Bld: 97 mg/dL (ref 65–99)
Potassium: 3.8 mmol/L (ref 3.5–5.1)
Sodium: 136 mmol/L (ref 135–145)
Total Bilirubin: 0.4 mg/dL (ref 0.3–1.2)
Total Protein: 7.8 g/dL (ref 6.5–8.1)

## 2015-04-08 LAB — CBC WITH DIFFERENTIAL/PLATELET
Basophils Absolute: 0 10*3/uL (ref 0–0.1)
Basophils Relative: 1 %
Eosinophils Absolute: 0.1 10*3/uL (ref 0–0.7)
Eosinophils Relative: 2 %
HCT: 39.2 % (ref 35.0–47.0)
Hemoglobin: 12.9 g/dL (ref 12.0–16.0)
Lymphocytes Relative: 40 %
Lymphs Abs: 1.3 10*3/uL (ref 1.0–3.6)
MCH: 28.4 pg (ref 26.0–34.0)
MCHC: 32.8 g/dL (ref 32.0–36.0)
MCV: 86.6 fL (ref 80.0–100.0)
Monocytes Absolute: 0.6 10*3/uL (ref 0.2–0.9)
Monocytes Relative: 19 %
Neutro Abs: 1.2 10*3/uL — ABNORMAL LOW (ref 1.4–6.5)
Neutrophils Relative %: 38 %
Platelets: 247 10*3/uL (ref 150–440)
RBC: 4.53 MIL/uL (ref 3.80–5.20)
RDW: 19.8 % — ABNORMAL HIGH (ref 11.5–14.5)
WBC: 3.2 10*3/uL — ABNORMAL LOW (ref 3.6–11.0)

## 2015-04-08 MED ORDER — SODIUM CHLORIDE 0.9 % IJ SOLN
10.0000 mL | Freq: Once | INTRAMUSCULAR | Status: AC
  Administered 2015-04-08: 10 mL via INTRAVENOUS
  Filled 2015-04-08: qty 10

## 2015-04-08 MED ORDER — HEPARIN SOD (PORK) LOCK FLUSH 100 UNIT/ML IV SOLN
500.0000 [IU] | Freq: Once | INTRAVENOUS | Status: AC
  Administered 2015-04-08: 500 [IU] via INTRAVENOUS
  Filled 2015-04-08: qty 5

## 2015-04-08 NOTE — Progress Notes (Addendum)
[facility] day:  04/08/2015  Chief Complaint: [name] [name] is a 64 y.o. female with recurrent breast cancer who is seen for review of interval CT scans and assessment prior to cycle #3 Taxol.  HPI: The patient was last seen in the medical oncology clinic on 03/10/2015.  At that time, she began cycle #2 Taxol.  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  She received Taxol weekly (last dose 03/24/2015).  Per prior plan, she underwent restaging studies per prior plan.  Chest and abdomen CT scan on 04/03/2015 revealed interval growth of superficial tumors in the lateral left breast and lateral left chest wall.  In the left lateral chest, disease had increased from 2.3 x 2 to 2.9 x 2.2 cm.  In the superficial lateral left chest wall, disease had increased from 1.7 x 1.2 cm to 2.7 x 1.8 cm.  There were stable findings of locally infiltrative tumor in the left chest wall and left axilla, including extensive left chest wall skin thickening, infiltrative soft tissue encasing the left axillary artery and focal pleural thickening in the anterior left mid pleural space.  There were no new sites of metastatic disease in the chest. No evidence of  metastatic disease in the abdomen.  Symptomatically, she denies any new complaints.  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  Past Medical History  Diagnosis Date  . Personal history of malignant neoplasm of breast   . Anemia   . Blood transfusion without reported diagnosis   . Emphysema of lung ([place])   . Breast cancer [facility])     Past Surgical History  Procedure Laterality Date  . Oophrectomy  1980  . Abdominal hysterectomy  1995  . Breast surgery Right 1996    mastectomy  . Breast biopsy Left 2014  . Breast biopsy Left 01-21-14    Family History  Problem Relation Age of Onset  . Cancer Father   . Heart disease Sister   . Diabetes Sister   . Heart disease Brother   . Cancer Paternal Grandmother      breast    Social History:  reports that she has been smoking Cigarettes.  She has a 6.25 pack-year smoking history. She has never used smokeless tobacco. She reports that she does not drink alcohol or use illicit drugs.  She is alone today.  Allergies:  Allergies  Allergen Reactions  . Sulfa Antibiotics Nausea And Vomiting    headache    Current Medications: Current Outpatient Prescriptions  Medication Sig Dispense Refill  . Calcium Carbonate-Vitamin D (CALCIUM + D PO) Take 2 tablets by mouth daily.    Marland Kitchen KLOR-CON 10 10 MEQ tablet TAKE 1 TABLET (10 MEQ TOTAL) BY MOUTH DAILY. 30 tablet 1  . LORazepam (ATIVAN) 0.5 MG tablet   0  . traMADol (ULTRAM) 50 MG tablet Take 1 tablet (50 mg total) by mouth every 8 (eight) hours as needed. 90 tablet 0   No current facility-administered medications for this visit.   Facility-Administered Medications Ordered in Other Visits  Medication Dose Route Frequency Provider Last Rate Last Dose  . sodium chloride 0.9 % injection 10 mL  10 mL Intravenous PRN Lequita Asal, MD   10 mL at 10/08/14 1335  . sodium chloride 0.9 % injection 10 mL  10 mL Intracatheter PRN Lequita Asal, MD   10 mL at 11/26/14 1355    Review of Systems:  GENERAL:  Feels good.  No fevers or  sweats.  Weight up 1 pound. PERFORMANCE STATUS (ECOG):  1 HEENT:  No visual changes, runny nose, sore throat, mouth sores or tenderness. Lungs: No shortness of breath or cough.  No hemoptysis. Cardiac:  No chest pain, palpitations, orthopnea, or PND. GI:  No nausea, vomiting, diarrhea, constipation, melena or hematochezia. GU:  No urgency, frequency, dysuria, or hematuria. Musculoskeletal:  No back pain.  No joint pain.  No muscle tenderness. Extremities:  No pain or swelling. Skin:  Hair thin.  No rashes or skin changes. Neuro:  Neuropathy in feet, stable.  Little neuropathy in left hand.  No headache, numbness or weakness, or coordination issues. Endocrine:  No diabetes,  thyroid issues, hot flashes or night sweats. Psych:  No mood changes, depression or anxiety. Pain: No complaints. Review of systems:  All other systems reviewed and found to be negative.   Physical Exam: Blood pressure 137/87, pulse 92, temperature 96.8 F (36 C), temperature source Tympanic, resp. rate 18, height 5' 7.5" (1.715 m), weight 167 lb 12.3 oz (76.1 kg).  GENERAL: Well developed, well nourished, sitting comfortably in the exam room in no acute distress. MENTAL STATUS: Alert and oriented to person, place and time. HEAD: Short gray hair. Normocephalic, atraumatic, face symmetric, no Cushingoid features. EYES: Brown eyes. No conjunctivitis or scleral icterus. ENT: Oropharynx clear without lesion. Tongue normal. Mucous membranes moist.  NECK:  Left neck cyst (stable). BREAST: Right chest wall lesions healed. Central upper sternal area flat.  Left 1.5 cm axillary nodule (stable).  Chest wall thickened (stable).  Left lateral denuded area triangular in shape (2 mm). SKIN: Cutaneous breast cancer noted in breast section. No other lesions.  Long nails. EXTREMITIES: No edema, no skin discoloration or tenderness. No palpable cords. LYMPH NODES: Left axillary ridge (stable). No palpable cervical, supraclavicular, axillary or inguinal adenopathy  NEUROLOGICAL: Unremarkable.  PSYCH: Appropriate.  Infusion on 04/08/2015  Component Date Value Ref Range Status  . WBC 04/08/2015 3.2* 3.6 - 11.0 K/uL Final  . RBC 04/08/2015 4.53  3.80 - 5.20 MIL/uL Final  . Hemoglobin 04/08/2015 12.9  12.0 - 16.0 g/dL Final  . HCT 04/08/2015 39.2  35.0 - 47.0 % Final  . MCV 04/08/2015 86.6  80.0 - 100.0 fL Final  . MCH 04/08/2015 28.4  26.0 - 34.0 pg Final  . MCHC 04/08/2015 32.8  32.0 - 36.0 g/dL Final  . RDW 04/08/2015 19.8* 11.5 - 14.5 % Final  . Platelets 04/08/2015 247  150 - 440 K/uL Final  . Neutrophils Relative % 04/08/2015 38   Final  . Neutro Abs 04/08/2015 1.2* 1.4 - 6.5 K/uL  Final  . Lymphocytes Relative 04/08/2015 40   Final  . Lymphs Abs 04/08/2015 1.3  1.0 - 3.6 K/uL Final  . Monocytes Relative 04/08/2015 19   Final  . Monocytes Absolute 04/08/2015 0.6  0.2 - 0.9 K/uL Final  . Eosinophils Relative 04/08/2015 2   Final  . Eosinophils Absolute 04/08/2015 0.1  0 - 0.7 K/uL Final  . Basophils Relative 04/08/2015 1   Final  . Basophils Absolute 04/08/2015 0.0  0 - 0.1 K/uL Final  . Sodium 04/08/2015 136  135 - 145 mmol/L Final  . Potassium 04/08/2015 3.8  3.5 - 5.1 mmol/L Final  . Chloride 04/08/2015 103  101 - 111 mmol/L Final  . CO2 04/08/2015 26  22 - 32 mmol/L Final  . Glucose, Bld 04/08/2015 97  65 - 99 mg/dL Final  . BUN 04/08/2015 16  6 - 20 mg/dL Final  .  Creatinine, Ser 04/08/2015 0.72  0.44 - 1.00 mg/dL Final  . Calcium 04/08/2015 9.2  8.9 - 10.3 mg/dL Final  . Total Protein 04/08/2015 7.8  6.5 - 8.1 g/dL Final  . Albumin 04/08/2015 4.0  3.5 - 5.0 g/dL Final  . AST 04/08/2015 33  15 - 41 U/L Final  . ALT 04/08/2015 31  14 - 54 U/L Final  . Alkaline Phosphatase 04/08/2015 147* 38 - 126 U/L Final  . Total Bilirubin 04/08/2015 0.4  0.3 - 1.2 mg/dL Final  . GFR calc non Af Amer 04/08/2015 >60  >60 mL/min Final  . GFR calc Af Amer 04/08/2015 >60  >60 mL/min Final   Comment: (NOTE) The eGFR has been calculated using the CKD EPI equation. This calculation has not been validated in all clinical situations. eGFR's persistently <60 mL/min signify possible Chronic Kidney Disease.   . Anion gap 04/08/2015 7  5 - 15 Final  . CA 27.29 04/08/2015 76.7* 0.0 - 38.6 U/mL Final   Comment: (NOTE) [employer] Cent[facility]/ACS methodology Performed At: [facility] [address] [place], [place] [id number] Lindon Romp MD BW:[id number]     Assessment:  [name] [name] is a 64 y.o. African American woman with a history of stage I right breast cancer s/p modified radical mastectomy on 06/12/1996. Pathology revealed a grade III mutifocal infiltrating ductal  carcinoma (tumor size 8.5 cm with an invasive component 1.6 cm). Twenty-one lymph nodes were negative. Tumor was ER/PR negative. She received 4 cycles of AC at [facility].  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 PET scan on 05/22/2012 revealed multiple areas of disease. She received 6 cycles of Taxotere and Cytoxan (TC) from 06/06/2012 - 09/29/2012. PET scan on 09/07/2012 noted marked improvement. Post chemotherapy biopsy was positive. She was not a surgical candidate. She was placed on Femara.  PET scan on 05/03/2013 revealed a new focus along lateral left breast and a new level II cervical lymph node. She received radiation from 09/13/2013 - 11/05/2013. PET scan on 01/01/2014 revealed new nodule medial left chest wall pleural/paraspinal activity LUL. Multiple biopsies on 01/19/2014 were positive. She began daily letrozole and Ibrance on 02/12/2014. Despite treatment, new nodules formed. She received a total of 2 cycles.   PET scan on 05/09/2014 revealed marked progression of disease with multifocal masses in chest wall (left > right). She did not receive Imbrance in 04/2014. CA27.29 has increased: 22.9 on 01/08/2014, 54 on 03/05/2014, 170.8 on 05/06/2014, 250.8 on 05/23/2014, 445.8 on 06/14/2014, and 531.1 on 07/01/2104.   She received 3 cycles of Xeloda (02/12 - 07/05/2014). Chest and abdomen CT scan on 07/05/2014 revealed persistent and slightly progressive diffuse locally invasive chest wall tumor. Bone scan on 07/25/2014 revealed no evidence of metastatic disease.   She received 8 cycles of [place] (08/06/2014 - 01/07/2015) on ACCRU [id number] I. 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  Chest, abdomen, and pelvic CT scan on 10/24/2014 revealed improved multifocal inflammatory left breast cancer.  The dominant 2.6 x 3.5 cm lesion in the left upper outer left  breast had decreased to 1.7 x 3.5 cm. The anterior sternal lesion has decreased from 2 x 4 cm to 1.5 x 3.9 cm. There were no suspicious mediastinal, hilar or axillary adenopathy.    Chest, abdomen, and pelvic CT scan on 01/20/2015 revealed inflammatory left breast cancer with new necrotic appearing subcutaneous nodules in the lateral left breast and lateral  left chest wall. CA27.29 was 339.8 on 08/06/2014, 51.7 on 10/15/2014, 37.6 on 12/10/2014, 26.4 on 01/07/2015, 34.1 on 01/28/2015, and 49.3 on 03/10/2015.   She received 2 cycles of Taxol given 3 weeks on/1 week off (02/10/2015 - 03/10/2015).  Chest and abdomen CT scan on 04/03/2015 revealed interval growth of superficial tumors in the lateral left breast and lateral left chest wall.  In the left lateral chest, disease had increased from 2.3 x 2 to 2.9 x 2.2 cm.  In the superficial lateral left chest wall, disease had increased from 1.7 x 1.2 cm to 2.7 x 1.8 cm.  There were stable findings of locally infiltrative tumor in the left chest wall and left axilla, including extensive left chest wall skin thickening, infiltrative soft tissue encasing the left axillary artery and focal pleural thickening in the anterior left mid pleural space.  There was no metastatic disease in the abdomen.  Symptomatically, she denies any chest wall pain.  Her neuropathy is stable.   Plan: 1.  Labs today: CBC with diff, CMP, CA27.29, Mg. 2.  DiscontinueTaxol. 3.  Discuss chemotherapy options (gemcitabine, ixabepilone, liposomal doxorubicin, navelbine) and response rates.  Discuss consideration of protocol enrollment and/or molecular tumor profiling assessing gene mutations for targeting.  Discuss contacting Dr. Lucile Crater at [facility] (prior oncologist) for [facility] clinical trials. 4.  RTC after above.  Addendum:  I spoke with Dr. Lucile Crater.  He suggested obtaining additional tissue (repeat biopsy given disparity between initial tumor in 1998 and biopsy in 2014) for  next-generation DNA sequencing ([place]) to assess protocol eligibility and potential response to available drugs off protocol.  The patient was contacted.   Lequita Asal, MD  04/08/2015, 9:03 AM

## 2015-04-08 NOTE — Patient Instructions (Signed)
Ixabepilone injection What is this medicine? IXABEPILONE (IX ab EP i lone) is a chemotherapy drug. It is used to treat advanced breast cancer. This medicine may be used for other purposes; ask your health care provider or pharmacist if you have questions. What should I tell my health care provider before I take this medicine? They need to know if you have any of these conditions: -diabetes -drug abuse or addiction -heart disease -if you frequently drink alcohol containing drinks -immune system problems -liver disease -low blood counts, like low white cell, platelet, or red cell counts -peripheral neuropathy -an unusual or allergic reaction to ixabepilone, Cremophor(R) EL, polyoxyethylated castor oil, other medicines, foods, dyes, or preservatives -pregnant or trying to get pregnant -breast-feeding How should I use this medicine? This medicine is for infusion into a vein. It is given by a health care professional in a hospital or clinic setting. Talk to your pediatrician regarding the use of this medicine in children. Special care may be needed. Overdosage: If you think you have taken too much of this medicine contact a poison control center or emergency room at once. NOTE: This medicine is only for you. Do not share this medicine with others. What if I miss a dose? It is important not to miss your dose. Call your doctor or health care professional if you are unable to keep an appointment. What may interact with this medicine? Do not take this medicine with any of the following medications: -disulfiram -grapefruit or grapefruit juice -metronidazole -St. John's wort This medicine may also interact with the following medications: -alcohol -antiviral medicines for HIV or AIDS -carbamazepine -clarithromycin -dexamethasone -erythromycin -medicines for fungal infections like ketoconazole, fluconazole, itraconazole, and  voriconazole -nefazodone -phenobarbital -phenytoin -rifabutin -rifampicin -rifampin -telithromycin -verapamil This list may not describe all possible interactions. Give your health care provider a list of all the medicines, herbs, non-prescription drugs, or dietary supplements you use. Also tell them if you smoke, drink alcohol, or use illegal drugs. Some items may interact with your medicine. What should I watch for while using this medicine? Your condition will be monitored carefully while you are receiving this medicine. You may get drowsy or dizzy. Do not drive, use machinery, or do anything that needs mental alertness until you know how this medicine affects you. Do not stand or sit up quickly, especially if you are an older patient. This reduces the risk of dizzy or fainting spells. Alcohol may interfere with the effect of this medicine. Avoid alcoholic drinks. This drug may make you feel generally unwell. This is not uncommon, as chemotherapy can affect healthy cells as well as cancer cells. Report any side effects. Continue your course of treatment even though you feel ill unless your doctor tells you to stop. Call your doctor or health care professional for advice if you get a fever, chills or sore throat, or other symptoms of a cold or flu. Do not treat yourself. This drug decreases your body's ability to fight infections. Try to avoid being around people who are sick. This medicine may increase your risk to bruise or bleed. Call your doctor or health care professional if you notice any unusual bleeding. Be careful brushing and flossing your teeth or using a toothpick because you may get an infection or bleed more easily. If you have any dental work done, tell your dentist you are receiving this medicine. Avoid taking products that contain aspirin, acetaminophen, ibuprofen, naproxen, or ketoprofen unless instructed by your doctor. These medicines may hide a  fever. Do not become pregnant  while taking this medicine. Women should inform their doctor if they wish to become pregnant or think they might be pregnant. There is a potential for serious side effects to an unborn child. Talk to your health care professional or pharmacist for more information. Do not breast-feed an infant while taking this medicine. This product may contain alcohol. Ask your pharmacist or healthcare provider if this medicine contains alcohol. Be sure to tell all healthcare providers you are taking this medicine. Certain medicines, like metronidazole and disulfiram, can cause an unpleasant reaction when taken with alcohol. The reaction includes flushing, headache, nausea, vomiting, sweating, and increased thirst. The reaction can last from 30 minutes to several hours. What side effects may I notice from receiving this medicine? Side effects that you should report to your doctor or health care professional as soon as possible: -allergic reactions like skin rash, itching or hives, swelling of the face, lips, or tongue -breathing problems -chest pain or palpitations -fast, irregular heartbeat -feeling faint or lightheaded, falls -fever or chills, sore throat -mouth sores -pain, tingling, numbness in the hands or feet -swelling of the legs or ankles -unusual bleeding or bruising -unusually weak or tired Side effects that usually do not require medical attention (report to your doctor or health care professional if they continue or are bothersome): -diarrhea -hair loss -headache -loss of appetite -muscle or joint pain -nausea, vomiting -stomach pain -trouble sleeping -weak or tired This list may not describe all possible side effects. Call your doctor for medical advice about side effects. You may report side effects to FDA at 1-800-FDA-1088. Where should I keep my medicine? This drug is given in a hospital or clinic and will not be stored at home. NOTE: This sheet is a summary. It may not cover all  possible information. If you have questions about this medicine, talk to your doctor, pharmacist, or health care provider.    2016, Elsevier/Gold Standard. (2014-11-14 11:27:01) Doxorubicin Liposomal injection What is this medicine? LIPOSOMAL DOXORUBICIN (LIP oh som al dox oh ROO bi sin) is a chemotherapy drug. This medicine is used to treat many kinds of cancer like Kaposi's sarcoma, multiple myeloma, and ovarian cancer. This medicine may be used for other purposes; ask your health care provider or pharmacist if you have questions. What should I tell my health care provider before I take this medicine? They need to know if you have any of these conditions: -blood disorders -heart disease -infection (especially a virus infection such as chickenpox, cold sores, or herpes) -liver disease -recent or ongoing radiation therapy -an unusual or allergic reaction to doxorubicin, other chemotherapy agents, soybeans, other medicines, foods, dyes, or preservatives -pregnant or trying to get pregnant -breast-feeding How should I use this medicine? This drug is given as an infusion into a vein. It is administered in a hospital or clinic by a specially trained health care professional. If you have pain, swelling, burning or any unusual feeling around the site of your injection, tell your health care professional right away. Talk to your pediatrician regarding the use of this medicine in children. Special care may be needed. Overdosage: If you think you have taken too much of this medicine contact a poison control center or emergency room at once. NOTE: This medicine is only for you. Do not share this medicine with others. What if I miss a dose? It is important not to miss your dose. Call your doctor or health care professional if you are unable  to keep an appointment. What may interact with this medicine? Do not take this medicine with any of the following medications: -zidovudine This medicine may also  interact with the following medications: -medicines to increase blood counts like filgrastim, pegfilgrastim, sargramostim -vaccines Talk to your doctor or health care professional before taking any of these medicines: -acetaminophen -aspirin -ibuprofen -ketoprofen -naproxen This list may not describe all possible interactions. Give your health care provider a list of all the medicines, herbs, non-prescription drugs, or dietary supplements you use. Also tell them if you smoke, drink alcohol, or use illegal drugs. Some items may interact with your medicine. What should I watch for while using this medicine? Your condition will be monitored carefully while you are receiving this medicine. You will need important blood work done while you are taking this medicine. This drug may make you feel generally unwell. This is not uncommon, as chemotherapy can affect healthy cells as well as cancer cells. Report any side effects. Continue your course of treatment even though you feel ill unless your doctor tells you to stop. Your urine may turn orange-red for a few days after your dose. This is not blood. If your urine is dark or brown, call your doctor. In some cases, you may be given additional medicines to help with side effects. Follow all directions for their use. Call your doctor or health care professional for advice if you get a fever (100.5 degrees F or higher), chills or sore throat, or other symptoms of a cold or flu. Do not treat yourself. This drug decreases your body's ability to fight infections. Try to avoid being around people who are sick. This medicine may increase your risk to bruise or bleed. Call your doctor or health care professional if you notice any unusual bleeding. Be careful brushing and flossing your teeth or using a toothpick because you may get an infection or bleed more easily. If you have any dental work done, tell your dentist you are receiving this medicine. Avoid taking  products that contain aspirin, acetaminophen, ibuprofen, naproxen, or ketoprofen unless instructed by your doctor. These medicines may hide a fever. Men and women of childbearing age should use effective birth control methods while using taking this medicine. Do not become pregnant while taking this medicine. There is a potential for serious side effects to an unborn child. Talk to your health care professional or pharmacist for more information. Do not breast-feed an infant while taking this medicine. Talk to your doctor about your risk of cancer. You may be more at risk for certain types of cancers if you take this medicine. What side effects may I notice from receiving this medicine? Side effects that you should report to your doctor or health care professional as soon as possible: -allergic reactions like skin rash, itching or hives, swelling of the face, lips, or tongue -low blood counts - this medicine may decrease the number of white blood cells, red blood cells and platelets. You may be at increased risk for infections and bleeding. -signs of hand-foot syndrome - tingling or burning, redness, flaking, swelling, small blisters, or small sores on the palms of your hands or the soles of your feet -signs of infection - fever or chills, cough, sore throat, pain or difficulty passing urine -signs of decreased platelets or bleeding - bruising, pinpoint red spots on the skin, black, tarry stools, blood in the urine -signs of decreased red blood cells - unusually weak or tired, fainting spells, lightheadedness -back pain, chills,  facial flushing, fever, headache, tightness in the chest or throat during the infusion -breathing problems -chest pain -fast, irregular heartbeat -mouth pain, redness, sores -pain, swelling, redness at site where injected -pain, tingling, numbness in the hands or feet -swelling of ankles, feet, or hands -vomiting Side effects that usually do not require medical attention  (report to your doctor or health care professional if they continue or are bothersome): -diarrhea -hair loss -loss of appetite -nail discoloration or damage -nausea -red or watery eyes -red colored urine -stomach upset This list may not describe all possible side effects. Call your doctor for medical advice about side effects. You may report side effects to FDA at 1-800-FDA-1088. Where should I keep my medicine? This drug is given in a hospital or clinic and will not be stored at home. NOTE: This sheet is a summary. It may not cover all possible information. If you have questions about this medicine, talk to your doctor, pharmacist, or health care provider.    2016, Elsevier/Gold Standard. (2011-12-17 10:12:56)  Gemcitabine injection What is this medicine? GEMCITABINE (jem SIT a been) is a chemotherapy drug. This medicine is used to treat many types of cancer like breast cancer, lung cancer, pancreatic cancer, and ovarian cancer. This medicine may be used for other purposes; ask your health care provider or pharmacist if you have questions. What should I tell my health care provider before I take this medicine? They need to know if you have any of these conditions: -blood disorders -infection -kidney disease -liver disease -recent or ongoing radiation therapy -an unusual or allergic reaction to gemcitabine, other chemotherapy, other medicines, foods, dyes, or preservatives -pregnant or trying to get pregnant -breast-feeding How should I use this medicine? This drug is given as an infusion into a vein. It is administered in a hospital or clinic by a specially trained health care professional. Talk to your pediatrician regarding the use of this medicine in children. Special care may be needed. Overdosage: If you think you have taken too much of this medicine contact a poison control center or emergency room at once. NOTE: This medicine is only for you. Do not share this medicine with  others. What if I miss a dose? It is important not to miss your dose. Call your doctor or health care professional if you are unable to keep an appointment. What may interact with this medicine? -medicines to increase blood counts like filgrastim, pegfilgrastim, sargramostim -some other chemotherapy drugs like cisplatin -vaccines Talk to your doctor or health care professional before taking any of these medicines: -acetaminophen -aspirin -ibuprofen -ketoprofen -naproxen This list may not describe all possible interactions. Give your health care provider a list of all the medicines, herbs, non-prescription drugs, or dietary supplements you use. Also tell them if you smoke, drink alcohol, or use illegal drugs. Some items may interact with your medicine. What should I watch for while using this medicine? Visit your doctor for checks on your progress. This drug may make you feel generally unwell. This is not uncommon, as chemotherapy can affect healthy cells as well as cancer cells. Report any side effects. Continue your course of treatment even though you feel ill unless your doctor tells you to stop. In some cases, you may be given additional medicines to help with side effects. Follow all directions for their use. Call your doctor or health care professional for advice if you get a fever, chills or sore throat, or other symptoms of a cold or flu. Do not treat  yourself. This drug decreases your body's ability to fight infections. Try to avoid being around people who are sick. This medicine may increase your risk to bruise or bleed. Call your doctor or health care professional if you notice any unusual bleeding. Be careful brushing and flossing your teeth or using a toothpick because you may get an infection or bleed more easily. If you have any dental work done, tell your dentist you are receiving this medicine. Avoid taking products that contain aspirin, acetaminophen, ibuprofen, naproxen, or  ketoprofen unless instructed by your doctor. These medicines may hide a fever. Women should inform their doctor if they wish to become pregnant or think they might be pregnant. There is a potential for serious side effects to an unborn child. Talk to your health care professional or pharmacist for more information. Do not breast-feed an infant while taking this medicine. What side effects may I notice from receiving this medicine? Side effects that you should report to your doctor or health care professional as soon as possible: -allergic reactions like skin rash, itching or hives, swelling of the face, lips, or tongue -low blood counts - this medicine may decrease the number of white blood cells, red blood cells and platelets. You may be at increased risk for infections and bleeding. -signs of infection - fever or chills, cough, sore throat, pain or difficulty passing urine -signs of decreased platelets or bleeding - bruising, pinpoint red spots on the skin, black, tarry stools, blood in the urine -signs of decreased red blood cells - unusually weak or tired, fainting spells, lightheadedness -breathing problems -chest pain -mouth sores -nausea and vomiting -pain, swelling, redness at site where injected -pain, tingling, numbness in the hands or feet -stomach pain -swelling of ankles, feet, hands -unusual bleeding Side effects that usually do not require medical attention (report to your doctor or health care professional if they continue or are bothersome): -constipation -diarrhea -hair loss -loss of appetite -stomach upset This list may not describe all possible side effects. Call your doctor for medical advice about side effects. You may report side effects to FDA at 1-800-FDA-1088. Where should I keep my medicine? This drug is given in a hospital or clinic and will not be stored at home. NOTE: This sheet is a summary. It may not cover all possible information. If you have questions  about this medicine, talk to your doctor, pharmacist, or health care provider.    2016, Elsevier/Gold Standard. (2007-08-08 18:45:54)

## 2015-04-09 LAB — CANCER ANTIGEN 27.29: CA 27.29: 76.7 U/mL — ABNORMAL HIGH (ref 0.0–38.6)

## 2015-04-15 ENCOUNTER — Ambulatory Visit: Payer: Medicare Other | Admitting: Surgery

## 2015-04-22 ENCOUNTER — Telehealth: Payer: Self-pay

## 2015-04-22 NOTE — Telephone Encounter (Signed)
Called pt per MD to report that MD had spoken with a doctor at [facility] that pt is established with and he was going to look over her file and be back in touch to discuss options.  Pt verbalized an understanding

## 2015-04-28 ENCOUNTER — Encounter: Payer: Self-pay | Admitting: Hematology and Oncology

## 2015-04-29 ENCOUNTER — Encounter: Payer: Self-pay | Admitting: General Surgery

## 2015-04-29 ENCOUNTER — Other Ambulatory Visit: Payer: Medicare Other

## 2015-04-29 ENCOUNTER — Ambulatory Visit (INDEPENDENT_AMBULATORY_CARE_PROVIDER_SITE_OTHER): Payer: Medicare Other | Admitting: General Surgery

## 2015-04-29 ENCOUNTER — Other Ambulatory Visit: Payer: Self-pay | Admitting: General Surgery

## 2015-04-29 VITALS — BP 120/74 | HR 74 | Resp 12 | Ht 67.0 in | Wt 167.0 lb

## 2015-04-29 DIAGNOSIS — N6320 Unspecified lump in the left breast, unspecified quadrant: Secondary | ICD-10-CM

## 2015-04-29 DIAGNOSIS — N63 Unspecified lump in breast: Secondary | ICD-10-CM | POA: Diagnosis not present

## 2015-04-29 DIAGNOSIS — C50112 Malignant neoplasm of central portion of left female breast: Secondary | ICD-10-CM | POA: Diagnosis not present

## 2015-04-29 NOTE — Progress Notes (Signed)
Patient ID: [name], female   DOB: 21-Apr-1950, 65 y.o.   MRN: [id number]  Chief Complaint  Patient presents with  . Procedure    left breast biopsy    HPI [name] [name] is a 65 y.o. female here today for a left breast biopsy. 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 At the time of presentation extended over an extensive area on the left chest wall and she was not a candidate for surgical resection. Past biopsies have shown at least a 1 ft. area of the chest wall, left flank and near supraclavicular area involved with the malignant process. She is undergone previous radiation therapy to the area as well as multiple courses of chemotherapy and hormonal therapy. She is now showing disease progression and her medical oncologist, Nolon Stalls, M.D. has requested fresh tissue for Foundation 1 testing.  I personally reviewed the patient's history. HPI  Past Medical History  Diagnosis Date  . Personal history of malignant neoplasm of breast   . Anemia   . Blood transfusion without reported diagnosis   . Emphysema of lung ([place])   . Breast cancer [facility])     Past Surgical History  Procedure Laterality Date  . Oophrectomy  1980  . Abdominal hysterectomy  1995  . Breast surgery Right 1996    mastectomy  . Breast biopsy Left 2014  . Breast biopsy Left 01-21-14    Family History  Problem Relation Age of Onset  . Cancer Father   . Heart disease Sister   . Diabetes Sister   . Heart disease Brother   . Cancer Paternal Grandmother     breast    Social History Social History  Substance Use Topics  . Smoking status: Current Every Day Smoker -- 0.25 packs/day for 25 years    Types: Cigarettes  . Smokeless tobacco: Never Used     Comment: 10/29/14-now decreased. smokes 1 pack per week  . Alcohol Use: No    Allergies  Allergen Reactions  . Sulfa Antibiotics Nausea And Vomiting   headache    Current Outpatient Prescriptions  Medication Sig Dispense Refill  . Calcium Carbonate-Vitamin D (CALCIUM + D PO) Take 2 tablets by mouth daily.    Marland Kitchen KLOR-CON 10 10 MEQ tablet TAKE 1 TABLET (10 MEQ TOTAL) BY MOUTH DAILY. 30 tablet 1  . LORazepam (ATIVAN) 0.5 MG tablet   0  . traMADol (ULTRAM) 50 MG tablet Take 1 tablet (50 mg total) by mouth every 8 (eight) hours as needed. 90 tablet 0   No current facility-administered medications for this visit.   Facility-Administered Medications Ordered in Other Visits  Medication Dose Route Frequency Provider Last Rate Last Dose  . sodium chloride 0.9 % injection 10 mL  10 mL Intravenous PRN Lequita Asal, MD   10 mL at 10/08/14 1335  . sodium chloride 0.9 % injection 10 mL  10 mL Intracatheter PRN Lequita Asal, MD   10 mL at 11/26/14 1355    Review of Systems Review of Systems  Blood pressure 120/74, pulse 74, resp. rate 12, height 5\' 7"  (1.702 m), weight 167 lb (75.751 kg).  Physical Exam Physical Exam  Pulmonary/Chest:      Data Reviewed I had previously spoken with [name] regarding the indication for biopsy. Pathology was consult regarding tissue handling. Formalin preserve tissue is adequate.    Assessment    Progression of invasive lobular carcinoma the  left breast without evidence of systemic disease.    Plan    Operative procedure was reviewed with the patient and she was amenable to proceed. 10 mL of 0.5% Xylocaine with 0.25% Marcaine with 1-200,000 units of epinephrine was utilized well tolerated. ChloraPrep was applied to the skin. The 1 cm nodule in the lower outer quadrant of the left breast was then excised in elliptical incision and placed in form 1. This was scheduled for delivery to [facility]. the defect was closed with interrupted 4-0 nylon sutures. Due to the tumor volume in the underlying breast this may not heal well, and arrangements will be made for suture removal in 9 days. 8 Telfa and  Tegaderm dressing was applied. The procedure was well tolerated.    A prescription for Norco 5/325, #20 with the inscription 1-2 by mouth every 4 hours when necessary for pain was provided. This information has been scribed by Gaspar Cola CMA.   [name] [name] 04/30/2015, 7:37 AM

## 2015-05-06 ENCOUNTER — Telehealth: Payer: Self-pay

## 2015-05-06 LAB — PATHOLOGY

## 2015-05-06 NOTE — Telephone Encounter (Signed)
-----   Message from Robert Bellow, MD sent at 05/06/2015  9:55 AM EST ----- Please notify the patient that the biopsy did show cancer, she will not be surprised. [name] has been notified [facility] can order additional testing as she requested. ----- Message -----    From: [facility] Lab Results In Interface    Sent: 04/30/2015   4:36 PM      To: Robert Bellow, MD

## 2015-05-08 ENCOUNTER — Other Ambulatory Visit: Payer: Self-pay

## 2015-05-08 ENCOUNTER — Other Ambulatory Visit: Payer: Self-pay | Admitting: Hematology and Oncology

## 2015-05-08 ENCOUNTER — Inpatient Hospital Stay: Payer: Medicare Other

## 2015-05-08 ENCOUNTER — Inpatient Hospital Stay: Payer: Medicare Other | Attending: Hematology and Oncology | Admitting: Hematology and Oncology

## 2015-05-08 ENCOUNTER — Encounter: Payer: Self-pay | Admitting: Hematology and Oncology

## 2015-05-08 VITALS — BP 132/72 | HR 99 | Temp 97.3°F | Resp 18 | Ht 67.0 in | Wt 167.5 lb

## 2015-05-08 DIAGNOSIS — F17210 Nicotine dependence, cigarettes, uncomplicated: Secondary | ICD-10-CM | POA: Insufficient documentation

## 2015-05-08 DIAGNOSIS — C50412 Malignant neoplasm of upper-outer quadrant of left female breast: Secondary | ICD-10-CM | POA: Insufficient documentation

## 2015-05-08 DIAGNOSIS — J069 Acute upper respiratory infection, unspecified: Secondary | ICD-10-CM | POA: Insufficient documentation

## 2015-05-08 DIAGNOSIS — Z170 Estrogen receptor positive status [ER+]: Secondary | ICD-10-CM | POA: Diagnosis not present

## 2015-05-08 DIAGNOSIS — Z79899 Other long term (current) drug therapy: Secondary | ICD-10-CM | POA: Diagnosis not present

## 2015-05-08 DIAGNOSIS — Z9221 Personal history of antineoplastic chemotherapy: Secondary | ICD-10-CM

## 2015-05-08 DIAGNOSIS — C50112 Malignant neoplasm of central portion of left female breast: Secondary | ICD-10-CM

## 2015-05-08 DIAGNOSIS — C50912 Malignant neoplasm of unspecified site of left female breast: Secondary | ICD-10-CM

## 2015-05-08 DIAGNOSIS — Z853 Personal history of malignant neoplasm of breast: Secondary | ICD-10-CM | POA: Diagnosis not present

## 2015-05-08 LAB — CBC WITH DIFFERENTIAL/PLATELET
Basophils Absolute: 0 10*3/uL (ref 0–0.1)
Basophils Relative: 1 %
Eosinophils Absolute: 0.1 10*3/uL (ref 0–0.7)
Eosinophils Relative: 1 %
HCT: 40.3 % (ref 35.0–47.0)
Hemoglobin: 13.5 g/dL (ref 12.0–16.0)
Lymphocytes Relative: 33 %
Lymphs Abs: 1.6 10*3/uL (ref 1.0–3.6)
MCH: 28.8 pg (ref 26.0–34.0)
MCHC: 33.4 g/dL (ref 32.0–36.0)
MCV: 86.1 fL (ref 80.0–100.0)
Monocytes Absolute: 0.4 10*3/uL (ref 0.2–0.9)
Monocytes Relative: 8 %
Neutro Abs: 2.8 10*3/uL (ref 1.4–6.5)
Neutrophils Relative %: 57 %
Platelets: 200 10*3/uL (ref 150–440)
RBC: 4.68 MIL/uL (ref 3.80–5.20)
RDW: 17.9 % — ABNORMAL HIGH (ref 11.5–14.5)
WBC: 4.8 10*3/uL (ref 3.6–11.0)

## 2015-05-08 LAB — COMPREHENSIVE METABOLIC PANEL
ALT: 23 U/L (ref 14–54)
AST: 25 U/L (ref 15–41)
Albumin: 4.1 g/dL (ref 3.5–5.0)
Alkaline Phosphatase: 114 U/L (ref 38–126)
Anion gap: 9 (ref 5–15)
BUN: 18 mg/dL (ref 6–20)
CO2: 21 mmol/L — ABNORMAL LOW (ref 22–32)
Calcium: 8.9 mg/dL (ref 8.9–10.3)
Chloride: 105 mmol/L (ref 101–111)
Creatinine, Ser: 0.79 mg/dL (ref 0.44–1.00)
GFR calc Af Amer: >60 mL/min (ref >60)
GFR calc non Af Amer: >60 mL/min (ref >60)
Glucose, Bld: 158 mg/dL — ABNORMAL HIGH (ref 65–99)
Potassium: 4.1 mmol/L (ref 3.5–5.1)
Sodium: 135 mmol/L (ref 135–145)
Total Bilirubin: 0.4 mg/dL (ref 0.3–1.2)
Total Protein: 7.7 g/dL (ref 6.5–8.1)

## 2015-05-08 MED ORDER — FULVESTRANT 250 MG/5ML IM SOLN
500.0000 mg | INTRAMUSCULAR | Status: DC
  Administered 2015-05-08: 500 mg via INTRAMUSCULAR
  Filled 2015-05-08: qty 10

## 2015-05-08 NOTE — Progress Notes (Signed)
[facility] day:  05/08/2015   Chief Complaint: [name] [name] is a 65 y.o. female with recurrent breast cancer who is seen for reassessment.  HPI: The patient was last seen in the medical oncology clinic on 04/08/2015.  At that time, restaging studies revealed progressive disease.  We discussed treatment options and protocol enrollment.  Decision was made to re-biopsy her chest wall to assist in making future treatment decisions and potential protocol enrollment at [facility].    Biopsy by Dr. 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  Estrogen receptor was > 90%, progesterone receptor was > 90%, and Her2/neu was 1+.  Foundation One testing is pending.  Symptomatically, she feels fine.  She had a bad cold last week with runny nose and congestion.  She is getting better.  Her appetite is down.  She notes that she has an appointment with wound care next week.  She has a new open spot on her skin under her arm.  Sutures are going to be removed tomorrow.  Past Medical History  Diagnosis Date  . Personal history of malignant neoplasm of breast   . Anemia   . Blood transfusion without reported diagnosis   . Emphysema of lung ([place])   . Breast cancer [facility])     Past Surgical History  Procedure Laterality Date  . Oophrectomy  1980  . Abdominal hysterectomy  1995  . Breast surgery Right 1996    mastectomy  . Breast biopsy Left 2014  . Breast biopsy Left 01-21-14    Family History  Problem Relation Age of Onset  . Cancer Father   . Heart disease Sister   . Diabetes Sister   . Heart disease Brother   . Cancer Paternal Grandmother     breast    Social History:  reports that she has been smoking Cigarettes.  She has a 6.25 pack-year smoking history. She has never used smokeless tobacco. She reports that she does not drink alcohol or use illicit drugs.  She is alone today.  Allergies:  Allergies   Allergen Reactions  . Sulfa Antibiotics Nausea And Vomiting    headache    Current Medications: Current Outpatient Prescriptions  Medication Sig Dispense Refill  . Calcium Carbonate-Vitamin D (CALCIUM + D PO) Take 2 tablets by mouth daily.    Marland Kitchen KLOR-CON 10 10 MEQ tablet TAKE 1 TABLET (10 MEQ TOTAL) BY MOUTH DAILY. 30 tablet 1  . LORazepam (ATIVAN) 0.5 MG tablet   0  . traMADol (ULTRAM) 50 MG tablet Take 1 tablet (50 mg total) by mouth every 8 (eight) hours as needed. 90 tablet 0   No current facility-administered medications for this visit.   Facility-Administered Medications Ordered in Other Visits  Medication Dose Route Frequency Provider Last Rate Last Dose  . sodium chloride 0.9 % injection 10 mL  10 mL Intravenous PRN Lequita Asal, MD   10 mL at 10/08/14 1335  . sodium chloride 0.9 % injection 10 mL  10 mL Intracatheter PRN Lequita Asal, MD   10 mL at 11/26/14 1355    Review of Systems:  GENERAL:  Feels fine.  No fevers or sweats.  Weight stable. PERFORMANCE STATUS (ECOG):  1 HEENT:  Resolving URI.  No visual changes, sore throat, mouth sores or tenderness. Lungs: No shortness of breath.  Slight cough.  No hemoptysis. Cardiac:  No chest pain, palpitations, orthopnea, or PND. GI:  No nausea, vomiting,  diarrhea, constipation, melena or hematochezia. GU:  No urgency, frequency, dysuria, or hematuria. Musculoskeletal:  No back pain.  No joint pain.  No muscle tenderness. Extremities:  No pain or swelling. Skin:  Hair thin.  New spot under left arm.  No rashes or skin changes. 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  No headache, numbness or weakness, or coordination issues. Endocrine:  No diabetes, thyroid issues, hot flashes or night sweats. Psych:  No mood changes, depression or anxiety. Pain: No complaints. Review of systems:  All other systems reviewed and found to be negative.   Physical Exam: Blood pressure 132/72, pulse 99, temperature 97.3 F (36.3 C),  temperature source Tympanic, resp. rate 18, height 5' 7"  (1.702 m), weight 167 lb 8.8 oz (76 kg).  GENERAL: Well developed, well nourished, sitting comfortably in the exam room in no acute distress. MENTAL STATUS: Alert and oriented to person, place and time. HEAD: Short brown wig. Normocephalic, atraumatic, face symmetric, no Cushingoid features. EYES: Brown eyes. Pupils equal round and reactive to light and accomodation.  No conjunctivitis or scleral icterus. ENT: Oropharynx clear without lesion. Tongue normal. Mucous membranes moist.  NECK:  Left neck cyst (stable). BREAST: Right chest wall lesions healed. Central upper sternal area flat.  Left 1.5 cm axillary nodule (stable).  Chest wall thickened.  Fingertip nodule in skin below left clavicle.  Sutures in 5 o'clock position below nipple.  Left lateral denuded area triangular in shape healed.  New 8 x 15 mm open area near old healed area. SKIN: Cutaneous breast cancer noted in breast section. No other lesions.  EXTREMITIES: No edema, no skin discoloration or tenderness. No palpable cords. LYMPH NODES: Left axillary ridge. No palpable cervical, supraclavicular, axillary or inguinal adenopathy  NEUROLOGICAL: Unremarkable.  PSYCH: Appropriate.  Appointment on 05/08/2015  Component Date Value Ref Range Status  . WBC 05/08/2015 4.8  3.6 - 11.0 K/uL Final  . RBC 05/08/2015 4.68  3.80 - 5.20 MIL/uL Final  . Hemoglobin 05/08/2015 13.5  12.0 - 16.0 g/dL Final  . HCT 05/08/2015 40.3  35.0 - 47.0 % Final  . MCV 05/08/2015 86.1  80.0 - 100.0 fL Final  . MCH 05/08/2015 28.8  26.0 - 34.0 pg Final  . MCHC 05/08/2015 33.4  32.0 - 36.0 g/dL Final  . RDW 05/08/2015 17.9* 11.5 - 14.5 % Final  . Platelets 05/08/2015 200  150 - 440 K/uL Final  . Neutrophils Relative % 05/08/2015 57   Final  . Neutro Abs 05/08/2015 2.8  1.4 - 6.5 K/uL Final  . Lymphocytes Relative 05/08/2015 33   Final  . Lymphs Abs 05/08/2015 1.6  1.0 - 3.6 K/uL Final  .  Monocytes Relative 05/08/2015 8   Final  . Monocytes Absolute 05/08/2015 0.4  0.2 - 0.9 K/uL Final  . Eosinophils Relative 05/08/2015 1   Final  . Eosinophils Absolute 05/08/2015 0.1  0 - 0.7 K/uL Final  . Basophils Relative 05/08/2015 1   Final  . Basophils Absolute 05/08/2015 0.0  0 - 0.1 K/uL Final  . Sodium 05/08/2015 135  135 - 145 mmol/L Final  . Potassium 05/08/2015 4.1  3.5 - 5.1 mmol/L Final  . Chloride 05/08/2015 105  101 - 111 mmol/L Final  . CO2 05/08/2015 21* 22 - 32 mmol/L Final  . Glucose, Bld 05/08/2015 158* 65 - 99 mg/dL Final  . BUN 05/08/2015 18  6 - 20 mg/dL Final  . Creatinine, Ser 05/08/2015 0.79  0.44 - 1.00 mg/dL Final  .  Calcium 05/08/2015 8.9  8.9 - 10.3 mg/dL Final  . Total Protein 05/08/2015 7.7  6.5 - 8.1 g/dL Final  . Albumin 05/08/2015 4.1  3.5 - 5.0 g/dL Final  . AST 05/08/2015 25  15 - 41 U/L Final  . ALT 05/08/2015 23  14 - 54 U/L Final  . Alkaline Phosphatase 05/08/2015 114  38 - 126 U/L Final  . Total Bilirubin 05/08/2015 0.4  0.3 - 1.2 mg/dL Final  . GFR calc non Af Amer 05/08/2015 >60  >60 mL/min Final  . GFR calc Af Amer 05/08/2015 >60  >60 mL/min Final   Comment: (NOTE) The eGFR has been calculated using the CKD EPI equation. This calculation has not been validated in all clinical situations. eGFR's persistently <60 mL/min signify possible Chronic Kidney Disease.   . Anion gap 05/08/2015 9  5 - 15 Final    Assessment:  [name] [name] is a 65 y.o. African American woman with a history of stage I right breast cancer s/p modified radical mastectomy on 06/12/1996. Pathology revealed a grade III mutifocal infiltrating ductal carcinoma (tumor size 8.5 cm with an invasive component 1.6 cm). Twenty-one lymph nodes were negative. Tumor was ER/PR negative. She received 4 cycles of AC at [facility].  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 PET  scan on 05/22/2012 revealed multiple areas of disease. She received 6 cycles of Taxotere and Cytoxan (TC) from 06/06/2012 - 09/29/2012. PET scan on 09/07/2012 noted marked improvement. Post chemotherapy biopsy was positive. She was not a surgical candidate. She was placed on Femara.  PET scan on 05/03/2013 revealed a new focus along lateral left breast and a new level II cervical lymph node. She received radiation from 09/13/2013 - 11/05/2013. PET scan on 01/01/2014 revealed new nodule medial left chest wall pleural/paraspinal activity LUL. Multiple biopsies on 01/19/2014 were positive. She began daily letrozole and Ibrance on 02/12/2014. Despite treatment, new nodules formed. She received a total of 2 cycles.   PET scan on 05/09/2014 revealed marked progression of disease with multifocal masses in chest wall (left > right). She did not receive Imbrance in 04/2014. CA27.29 has increased: 22.9 on 01/08/2014, 54 on 03/05/2014, 170.8 on 05/06/2014, 250.8 on 05/23/2014, 445.8 on 06/14/2014, and 531.1 on 07/01/2104.   She received 3 cycles of Xeloda (02/12 - 07/05/2014). Chest and abdomen CT scan on 07/05/2014 revealed persistent and slightly progressive diffuse locally invasive chest wall tumor. Bone scan on 07/25/2014 revealed no evidence of metastatic disease.   She received 8 cycles of Halaven (08/06/2014 - 01/07/2015) on ACCRU [id number] I. 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  Chest, abdomen, and pelvic CT scan on 10/24/2014 revealed improved multifocal inflammatory left breast cancer.  The dominant 2.6 x 3.5 cm lesion in the left upper outer left breast had decreased to 1.7 x 3.5 cm. The anterior sternal lesion has decreased from 2 x 4 cm to 1.5 x 3.9 cm. There were no suspicious mediastinal, hilar or axillary adenopathy.    Chest, abdomen, and pelvic CT scan on 01/20/2015 revealed inflammatory left breast cancer with new necrotic appearing subcutaneous nodules in the  lateral left breast and lateral left chest wall. CA27.29 was 339.8 on 08/06/2014, 51.7 on 10/15/2014, 37.6 on 12/10/2014, 26.4 on 01/07/2015, 34.1 on 01/28/2015, and 49.3 on 03/10/2015.   She received 2 cycles of Taxol given 3 weeks on/1 week off (02/10/2015 - 03/10/2015).  Chest and abdomen CT  scan on 04/03/2015 revealed interval growth of superficial tumors in the lateral left breast and lateral left chest wall.  In the left lateral chest, disease had increased from 2.3 x 2 to 2.9 x 2.2 cm.  In the superficial lateral left chest wall, disease had increased from 1.7 x 1.2 cm to 2.7 x 1.8 cm.  There were stable findings of locally infiltrative tumor in the left chest wall and left axilla, including extensive left chest wall skin thickening, infiltrative soft tissue encasing the left axillary artery and focal pleural thickening in the anterior left mid pleural space.  There was no metastatic disease in the abdomen.  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  Estrogen receptor was > 90%, progesterone receptor was > 90%, and Her2/neu was 1+.  Foundation One testing is pending.  Symptomatically, she is recovering from a URI.  She has new small chest wall lesions.  Plan: 1.  Labs today: CBC with diff, CMP, CA27.29. 2.  Discuss pathology.  Discuss pending Foundation One testing.  Discuss hormone receptor positive disease.  Review prior treatment (hormonal and chemotherapy).  Discuss trial of hormonal therapy (Faslodex or Aromasin + everolimus).  Discuss side effects associated with Faslodex.  Information sheets provided.  Discuss continued hormone treatment if disease stable or shrinking.  Patient felt comfortable proceeding with treatment.  Discuss Faslodex load (today and in 2 weeks followed by monthly (ie, day 1, 14, 29 then monthly).  Patient consented to treatment. 3.  Port flush every 6-8 weeks. 4.  RTC in 2 weeks for Faslodex. 5.   RTC in 4 weeks for MD assessment, labs (CBC with diff, CMP, CA27.20) and Faslodex.   Lequita Asal, MD  05/08/2015, 12:37 PM

## 2015-05-08 NOTE — Telephone Encounter (Signed)
Message left for patient to call back for results.

## 2015-05-09 ENCOUNTER — Ambulatory Visit (INDEPENDENT_AMBULATORY_CARE_PROVIDER_SITE_OTHER): Payer: Medicare Other | Admitting: *Deleted

## 2015-05-09 DIAGNOSIS — N63 Unspecified lump in breast: Secondary | ICD-10-CM

## 2015-05-09 DIAGNOSIS — N6320 Unspecified lump in the left breast, unspecified quadrant: Secondary | ICD-10-CM

## 2015-05-09 LAB — CANCER ANTIGEN 27.29: CA 27.29: 103.9 U/mL — ABNORMAL HIGH (ref 0.0–38.6)

## 2015-05-09 NOTE — Progress Notes (Signed)
Patient came in today for a wound check .  The wound is clean, with no signs of infection noted. The sutures were removed. Notified patient of path.

## 2015-05-20 ENCOUNTER — Encounter: Payer: Medicare Other | Attending: Internal Medicine | Admitting: Internal Medicine

## 2015-05-20 DIAGNOSIS — Z9221 Personal history of antineoplastic chemotherapy: Secondary | ICD-10-CM | POA: Diagnosis not present

## 2015-05-20 DIAGNOSIS — S21002A Unspecified open wound of left breast, initial encounter: Secondary | ICD-10-CM | POA: Diagnosis not present

## 2015-05-20 DIAGNOSIS — S21001A Unspecified open wound of right breast, initial encounter: Secondary | ICD-10-CM | POA: Diagnosis not present

## 2015-05-20 DIAGNOSIS — X58XXXA Exposure to other specified factors, initial encounter: Secondary | ICD-10-CM | POA: Insufficient documentation

## 2015-05-20 DIAGNOSIS — C50812 Malignant neoplasm of overlapping sites of left female breast: Secondary | ICD-10-CM | POA: Insufficient documentation

## 2015-05-20 DIAGNOSIS — C50412 Malignant neoplasm of upper-outer quadrant of left female breast: Secondary | ICD-10-CM | POA: Diagnosis not present

## 2015-05-21 NOTE — Progress Notes (Signed)
MAYDELLE, KINDLER ([id number]) Visit Report for 05/20/2015 Arrival Information Details Patient Name: [name] [name], [name] [name] Date of Service: 05/20/2015 10:00 AM Medical Record Number: [id number] Patient Account Number: [id number] Date of Birth/Sex: Nov 13, 1950 (65 y.o. Female) Treating RN: Afful, RN, BSN, [employer] Primary Care Physician: PATIENT, NO Other Clinician: Referring Physician: Treating Physician/Extender: Frann Rider in Treatment: 80 Visit Information History Since Last Visit Added or deleted any medications: No Patient Arrived: Ambulatory Any new allergies or adverse reactions: No Arrival Time: 10:20 Had a fall or experienced change in No Accompanied By: self activities of daily living that may affect Transfer Assistance: None risk of falls: Patient Identification Verified: Yes Signs or symptoms of abuse/neglect since last No Secondary Verification Process Yes visito Completed: Hospitalized since last visit: No Patient Requires Transmission-Based No Has Dressing in Place as Prescribed: Yes Precautions: Pain Present Now: No Patient Has Alerts: No Electronic Signature(s) Signed: 05/20/2015 3:17:41 PM By: Regan Lemming BSN, RN Entered By: Regan Lemming on 05/20/2015 10:22:03 [name] [name] ([id number]) -------------------------------------------------------------------------------- Clinic Level of Care Assessment Details Patient Name: [name] [name] Date of Service: 05/20/2015 10:00 AM Medical Record Patient Account Number: [id number] Number: Treating RN: Baruch Gouty, RN, BSN, Rita Jun 23, 1950 [phone] y.o. Other Clinician: Date of Birth/Sex: Female) Treating ROBSON, MICHAEL Primary Care Physician/Extender: G PATIENT, NO Physician: Referring Physician: Weeks in Treatment: 91 Clinic Level of Care Assessment Items TOOL 4 Quantity Score []  - Use when only an EandM is performed on FOLLOW-UP visit 0 ASSESSMENTS - Nursing Assessment / Reassessment X - Reassessment of  Co-morbidities (includes updates in patient status) 1 10 X - Reassessment of Adherence to Treatment Plan 1 5 ASSESSMENTS - Wound and Skin Assessment / Reassessment X - Simple Wound Assessment / Reassessment - one wound 1 5 []  - Complex Wound Assessment / Reassessment - multiple wounds 0 []  - Dermatologic / Skin Assessment (not related to wound area) 0 ASSESSMENTS - Focused Assessment []  - Circumferential Edema Measurements - multi extremities 0 []  - Nutritional Assessment / Counseling / Intervention 0 []  - Lower Extremity Assessment (monofilament, tuning fork, pulses) 0 []  - Peripheral Arterial Disease Assessment (using hand held doppler) 0 ASSESSMENTS - Ostomy and/or Continence Assessment and Care []  - Incontinence Assessment and Management 0 []  - Ostomy Care Assessment and Management (repouching, etc.) 0 PROCESS - Coordination of Care X - Simple Patient / Family Education for ongoing care 1 15 []  - Complex (extensive) Patient / Family Education for ongoing care 0 []  - Staff obtains Consents, Records, Test Results / Process Orders 0 KERI, DUEL ([id number]) []  - Staff telephones HHA, Nursing Homes / Clarify orders / etc 0 []  - Routine Transfer to another Facility (non-emergent condition) 0 []  - Routine Hospital Admission (non-emergent condition) 0 []  - New Admissions / Biomedical engineer / Ordering NPWT, Apligraf, etc. 0 []  - Emergency Hospital Admission (emergent condition) 0 []  - Simple Discharge Coordination 0 []  - Complex (extensive) Discharge Coordination 0 PROCESS - Special Needs []  - Pediatric / Minor Patient Management 0 []  - Isolation Patient Management 0 []  - Hearing / Language / Visual special needs 0 []  - Assessment of Community assistance (transportation, D/C planning, etc.) 0 []  - Additional assistance / Altered mentation 0 []  - Support Surface(s) Assessment (bed, cushion, seat, etc.) 0 INTERVENTIONS - Wound Cleansing / Measurement X - Simple Wound  Cleansing - one wound 1 5 []  - Complex Wound Cleansing - multiple wounds 0 X - Wound Imaging (photographs - any number of wounds) 1 5 []  -  Wound Tracing (instead of photographs) 0 X - Simple Wound Measurement - one wound 1 5 []  - Complex Wound Measurement - multiple wounds 0 INTERVENTIONS - Wound Dressings X - Small Wound Dressing one or multiple wounds 1 10 []  - Medium Wound Dressing one or multiple wounds 0 []  - Large Wound Dressing one or multiple wounds 0 []  - Application of Medications - topical 0 []  - Application of Medications - injection 0 Tunney, Neva W. ([id number]) INTERVENTIONS - Miscellaneous []  - External ear exam 0 []  - Specimen Collection (cultures, biopsies, blood, body fluids, etc.) 0 []  - Specimen(s) / Culture(s) sent or taken to Lab for analysis 0 []  - Patient Transfer (multiple staff / Harrel Lemon Lift / Similar devices) 0 []  - Simple Staple / Suture removal (25 or less) 0 []  - Complex Staple / Suture removal (26 or more) 0 []  - Hypo / Hyperglycemic Management (close monitor of Blood Glucose) 0 []  - Ankle / Brachial Index (ABI) - do not check if billed separately 0 X - Vital Signs 1 5 Has the patient been seen at the hospital within the last three years: Yes Total Score: 65 Level Of Care: New/Established - Level 2 Electronic Signature(s) Signed: 05/20/2015 3:17:41 PM By: Regan Lemming BSN, RN Entered By: Regan Lemming on 05/20/2015 10:35:02 [name] [name] ([id number]) -------------------------------------------------------------------------------- Complex / Palliative Patient Assessment Details Patient Name: [name] [name] Date of Service: 05/20/2015 10:00 AM Medical Record Patient Account Number: [id number] Number: Treating RN: Baruch Gouty, RN, BSN, Rita 1950/06/22 [phone] y.o. Other Clinician: Date of Birth/Sex: Female) Treating ROBSON, MICHAEL Primary Care Physician/Extender: G PATIENT, NO Physician: Referring Physician: Weeks in Treatment: 44 Palliative  Management Criteria Complex Wound Management Criteria Patient has remarkable or complex co-morbidities requiring medications or treatments that extend wound healing times. Examples: o Diabetes mellitus with chronic renal failure or end stage renal disease requiring dialysis o Advanced or poorly controlled rheumatoid arthritis o Diabetes mellitus and end stage chronic obstructive pulmonary disease o Active cancer with current chemo- or radiation therapy Metarsize breast cancer Care Approach Wound Care Plan: Complex Wound Management Electronic Signature(s) Signed: 05/20/2015 2:14:13 PM By: Regan Lemming BSN, RN Signed: 05/20/2015 3:38:56 PM By: Linton Ham MD Entered By: Regan Lemming on 05/20/2015 14:14:13 [name] [name] ([id number]) -------------------------------------------------------------------------------- Encounter Discharge Information Details Patient Name: [name] [name] Date of Service: 05/20/2015 10:00 AM Medical Record Patient Account Number: [id number] Number: Treating RN: Baruch Gouty, RN, BSN, Rita Apr 27, 1950 [phone] y.o. Other Clinician: Date of Birth/Sex: Female) Treating ROBSON, MICHAEL Primary Care Physician/Extender: G PATIENT, NO Physician: Referring Physician: Weeks in Treatment: 49 Encounter Discharge Information Items Discharge Pain Level: 0 Discharge Condition: Stable Ambulatory Status: Ambulatory Discharge Destination: Home Transportation: Private Auto Accompanied By: self Schedule Follow-up Appointment: No Medication Reconciliation completed and provided to Patient/Care No Alajia Schmelzer: Provided on Clinical Summary of Care: 05/20/2015 Form Type Recipient Paper Patient EG Electronic Signature(s) Signed: 05/20/2015 3:17:41 PM By: Regan Lemming BSN, RN Previous Signature: 05/20/2015 10:35:13 AM Version By: Ruthine Dose Entered By: Regan Lemming on 05/20/2015 10:35:42 [name] [name]  ([id number]) -------------------------------------------------------------------------------- Multi Wound Chart Details Patient Name: [name] [name] Date of Service: 05/20/2015 10:00 AM Medical Record Patient Account Number: [id number] Number: Treating RN: Baruch Gouty, RN, BSN, Rita 12-09-1950 [phone] y.o. Other Clinician: Date of Birth/Sex: Female) Treating ROBSON, MICHAEL Primary Care Physician/Extender: G PATIENT, NO Physician: Referring Physician: Weeks in Treatment: 44 Vital Signs Height(in): 68 Pulse(bpm): 82 Weight(lbs): 180.5 Blood Pressure 124/80 (mmHg): Body Mass Index(BMI): 27 Temperature(F): 98 Respiratory Rate  16 (breaths/min): Photos: [1:No Photos] [N/A:N/A] Wound Location: [1:Left Chest] [N/A:N/A] Wounding Event: [1:Other Lesion] [N/A:N/A] Primary Etiology: [1:Malignant Wound] [N/A:N/A] Comorbid History: [1:Received Chemotherapy, Received Radiation] [N/A:N/A] Date Acquired: [1:05/10/2014] [N/A:N/A] Weeks of Treatment: [1:44] [N/A:N/A] Wound Status: [1:Open] [N/A:N/A] Measurements L x W x D 1x1.2x0.1 [N/A:N/A] (cm) Area (cm) : [1:0.942] [N/A:N/A] Volume (cm) : [1:0.094] [N/A:N/A] % Reduction in Area: [1:96.00%] [N/A:N/A] % Reduction in Volume: 98.00% [N/A:N/A] Classification: [1:Full Thickness Without Exposed Support Structures] [N/A:N/A] Exudate Amount: [1:Medium] [N/A:N/A] Exudate Type: [1:Serosanguineous] [N/A:N/A] Exudate Color: [1:red, brown] [N/A:N/A] Wound Margin: [1:Distinct, outline attached] [N/A:N/A] Granulation Amount: [1:Large (67-100%)] [N/A:N/A] Granulation Quality: [1:Red] [N/A:N/A] Necrotic Amount: [1:Small (1-33%)] [N/A:N/A] Exposed Structures: [1:Fascia: No Fat: No] [N/A:N/A] Tendon: No Muscle: No Joint: No Bone: No Limited to Skin Breakdown Epithelialization: None N/A N/A Periwound Skin Texture: Induration: Yes N/A N/A Scarring: Yes Edema: No Excoriation: No Callus: No Crepitus: No Fluctuance: No Friable:  No Rash: No Periwound Skin No Abnormalities Noted N/A N/A Moisture: Periwound Skin Color: No Abnormalities Noted N/A N/A Temperature: No Abnormality N/A N/A Tenderness on No N/A N/A Palpation: Wound Preparation: Ulcer Cleansing: N/A N/A Rinsed/Irrigated with Saline Topical Anesthetic Applied: Other: lidocaine 4% Treatment Notes Electronic Signature(s) Signed: 05/20/2015 10:28:42 AM By: Regan Lemming BSN, RN Entered By: Regan Lemming on 05/20/2015 10:28:42 [name] [name] ([id number]) -------------------------------------------------------------------------------- [place] Details Patient Name: [name] [name] Date of Service: 05/20/2015 10:00 AM Medical Record Patient Account Number: [id number] Number: Treating RN: Baruch Gouty, RN, BSN, Rita [birth year] [phone] y.o. Other Clinician: Date of Birth/Sex: Female) Treating ROBSON, MICHAEL Primary Care Physician/Extender: G PATIENT, NO Physician: Referring Physician: Weeks in Treatment: 56 Active Inactive Abuse / Safety / Falls / Self Care Management Nursing Diagnoses: Potential for falls Goals: Patient will remain injury free Date Initiated: 07/11/2014 Goal Status: Active Interventions: Assess fall risk on admission and as needed Notes: Malignancy/Atypical Etiology Nursing Diagnoses: Knowledge deficit related to disease process and management of atypical ulcer etiology Goals: Patient/caregiver will verbalize understanding of disease process and disease management of atypical ulcer etiology Date Initiated: 07/11/2014 Goal Status: Active Interventions: Provide education on atypical ulcer etiologies Notes: Nutrition Nursing Diagnoses: Potential for alteratiion in Nutrition/Potential for imbalanced nutrition VYSHNAVI, CIFELLI. ([id number]) Goals: Patient/caregiver agrees to and verbalizes understanding of need to use nutritional supplements and/or vitamins as prescribed Date Initiated: 07/11/2014 Goal  Status: Active Interventions: Assess patient nutrition upon admission and as needed per policy Notes: Orientation to the Wound Care Program Nursing Diagnoses: Knowledge deficit related to the wound healing center program Goals: Patient/caregiver will verbalize understanding of the [place] Date Initiated: 07/11/2014 Goal Status: Active Interventions: Provide education on orientation to the wound center Notes: Wound/Skin Impairment Nursing Diagnoses: Impaired tissue integrity Goals: Patient will demonstrate a reduced rate of smoking or cessation of smoking Date Initiated: 07/11/2014 Goal Status: Active Patient will have a decrease in wound volume by X% from date: (specify in notes) Date Initiated: 07/11/2014 Goal Status: Active Patient/caregiver will verbalize understanding of skin care regimen Date Initiated: 07/11/2014 Goal Status: Active Ulcer/skin breakdown will have a volume reduction of 30% by week 4 Date Initiated: 07/11/2014 Goal Status: Active Ulcer/skin breakdown will have a volume reduction of 50% by week 8 Date Initiated: 07/11/2014 [name] [name] ([id number]) Goal Status: Active Ulcer/skin breakdown will have a volume reduction of 80% by week 12 Date Initiated: 07/11/2014 Goal Status: Active Ulcer/skin breakdown will heal within 14 weeks Date Initiated: 07/11/2014 Goal Status: Active Interventions: Assess patient/caregiver ability to obtain necessary supplies Assess  patient/caregiver ability to perform ulcer/skin care regimen upon admission and as needed Assess ulceration(s) every visit Provide education on smoking Provide education on ulcer and skin care Screen for HBO Notes: Electronic Signature(s) Signed: 05/20/2015 10:28:34 AM By: Regan Lemming BSN, RN Entered By: Regan Lemming on 05/20/2015 10:28:33 [name] [name] ([id number]) -------------------------------------------------------------------------------- Patient/Caregiver Education  Details Patient Name: [name] [name] Date of Service: 05/20/2015 10:00 AM Medical Record Patient Account Number: [id number] Number: Treating RN: Baruch Gouty, RN, BSN, Rita 07-26-1950 [phone] y.o. Other Clinician: Date of Birth/Gender: Female) Treating ROBSON, MICHAEL Primary Care Physician/Extender: G PATIENT, NO Physician: Weeks in Treatment: 51 Referring Physician: Education Assessment Education Provided To: Patient Education Topics Provided Malignant/Atypical Wounds: Methods: Explain/Verbal Smoking and Wound Healing: Methods: Explain/Verbal Responses: State content correctly Wound/Skin Impairment: Methods: Explain/Verbal Responses: State content correctly Electronic Signature(s) Signed: 05/20/2015 3:17:41 PM By: Regan Lemming BSN, RN Entered By: Regan Lemming on 05/20/2015 10:35:57 [name] [name] ([id number]) -------------------------------------------------------------------------------- Wound Assessment Details Patient Name: [name] [name] Date of Service: 05/20/2015 10:00 AM Medical Record Number: [id number] Patient Account Number: [id number] Date of Birth/Sex: 1950/12/16 (64 y.o. Female) Treating RN: Afful, RN, BSN, Administrator, sports Primary Care Physician: PATIENT, NO Other Clinician: Referring Physician: Treating Physician/Extender: Frann Rider in Treatment: 44 Wound Status Wound Number: 1 Primary Malignant Wound Etiology: Wound Location: Left Chest Wound Status: Open Wounding Event: Other Lesion Comorbid Received Chemotherapy, Received Date Acquired: 05/10/2014 History: Radiation Weeks Of Treatment: 44 Clustered Wound: No Photos Photo Uploaded By: Regan Lemming on 05/20/2015 14:16:27 Wound Measurements Length: (cm) 1 Width: (cm) 1.2 Depth: (cm) 0.1 Area: (cm) 0.942 Volume: (cm) 0.094 % Reduction in Area: 96% % Reduction in Volume: 98% Epithelialization: None Tunneling: No Undermining: No Wound Description Full Thickness Without  Exposed Classification: Support Structures Wound Margin: Distinct, outline attached Exudate Medium Amount: Exudate Type: Serosanguineous Exudate Color: red, brown Foul Odor After Cleansing: No Wound Bed Granulation Amount: Large (67-100%) Exposed Structure Granulation Quality: Red Fascia Exposed: No Necrotic Amount: Small (1-33%) Fat Layer Exposed: No Orndoff, Kmya W. ([id number]) Necrotic Quality: Adherent Slough Tendon Exposed: No Muscle Exposed: No Joint Exposed: No Bone Exposed: No Limited to Skin Breakdown Periwound Skin Texture Texture Color No Abnormalities Noted: No No Abnormalities Noted: Yes Callus: No Temperature / Pain Crepitus: No Temperature: No Abnormality Excoriation: No Fluctuance: No Friable: No Induration: Yes Localized Edema: No Rash: No Scarring: Yes Moisture No Abnormalities Noted: Yes Wound Preparation Ulcer Cleansing: Rinsed/Irrigated with Saline Topical Anesthetic Applied: Other: lidocaine 4%, Treatment Notes Wound #1 (Left Chest) 1. Cleansed with: Clean wound with Normal Saline 4. Dressing Applied: Aquacel Ag 5. Secondary Dressing Applied Bordered Foam Dressing Electronic Signature(s) Signed: 05/20/2015 3:17:41 PM By: Regan Lemming BSN, RN Entered By: Regan Lemming on 05/20/2015 10:24:27 [name] [name] ([id number]) -------------------------------------------------------------------------------- Vitals Details Patient Name: [name] [name] Date of Service: 05/20/2015 10:00 AM Medical Record Patient Account Number: [id number] Number: Treating RN: Baruch Gouty, RN, BSN, Rita [birth year] [phone] y.o. Other Clinician: Date of Birth/Sex: Female) Treating ROBSON, MICHAEL Primary Care Physician/Extender: G PATIENT, NO Physician: Referring Physician: Weeks in Treatment: 44 Vital Signs Time Taken: 10:24 Temperature (F): 98 Height (in): 68 Pulse (bpm): 82 Weight (lbs): 180.5 Respiratory Rate (breaths/min): 16 Body Mass Index  (BMI): 27.4 Blood Pressure (mmHg): 124/80 Reference Range: 80 - 120 mg / dl Electronic Signature(s) Signed: 05/20/2015 10:28:23 AM By: Regan Lemming BSN, RN Entered By: Regan Lemming on 05/20/2015 [id number]

## 2015-05-21 NOTE — Progress Notes (Signed)
[name] [name] ([id number]) Visit Report for 05/20/2015 Chief Complaint Document Details Patient Name: [name] [name], [name] [name] Date of Service: 05/20/2015 10:00 AM Medical Record Patient Account Number: [id number] Number: Treating RN: Baruch Gouty, RN, BSN, Rita 1950/09/15 [phone] y.o. 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 07/18/2014 -- since last week the patient has had no complaints and is on her last cycle of chemotherapy. I have reviewed 155 pages of her reports from a medical oncologist and the summary is made in the history of present illness. Electronic Signature(s) Signed: 05/20/2015 3:38:56 PM By: Linton Ham MD Entered By: Linton Ham on 05/20/2015 12:40:46 [name] [name] ([id number]) -------------------------------------------------------------------------------- HPI Details Patient Name: [name] [name] Date of Service: 05/20/2015 10:00 AM Medical Record Patient Account Number: [id number] Number: Treating RN: Baruch Gouty, RN, BSN, Rita 05-21-1950 [phone] y.o. Other Clinician: Date of Birth/Sex: Female) Treating Brasen Bundren Primary Care Physician/Extender: G PATIENT, NO Physician: Referring Physician: Weeks in Treatment: 29 History of Present Illness HPI Description: this 65 year old patient has come by herself today as a self-referral and has no documentation with her. She has had open wounds to her left chest and the right chest wall for about 2 months. She is not a diabetic and has no significant medical history except breast cancer. Her right breast cancer  was diagnosed 18 years ago and she had a mastectomy and chemotherapy. 3 years ago she was then diagnosed with a breast cancer of the left side which was advanced and could not be operated and was given chemotherapy for a year and then followed by radiation. Her last radiation was over a year ago. She has not had any biopsy done recently from the ulcerated area but she says there have been other biopsies done from the chest wall and these reports are not available at the present time. She was told to keep the wounds open and let them dry out but she is finding it's more and more difficult to stop soiling her clothes if she leaves wounds open. She is on chemotherapy and we will try and obtain these notes from her oncologist. She is not in pain and does not have any significant problems except the discomfort from an open wound on the chest wall. 07/18/2014 This is a summary of [name] [name] date of birth 1951/02/11. These are obtained by reviewing 151 pages of medical records which were obtained from the [facility] 65 year old patient who had a history of stage I right breast cancer status post modified radical mastectomy on 06/12/1996. 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 She received 4 cycles of AC at [facility]. In January 2014 she presented with left breast swelling and apparently cellulitis. Breast biopsy done revealed lobular carcinoma which was ER and PR positive and HER-2/neu negative. On PET scan and she was shown to have multiple areas of disease left breast, axilla and supraclavicular areas. She received 6 cycles of chemotherapy and then received Femara because she was not a surgical candidate. PET scan also revealed disease in the left lateral breast and new cervical lymph nodes at level II. The patient  was then sent for radiation therapy and received this over a month. In January 2016 she had a PET CT scan done  which was compatible with marked progression of disease with multifocal soft tissue masses in the chest wall left greater than right. She is currently receiving Xeloda and is noted to have disease in the chest wall which is widely present. Her most recent lab work from 07/02/2014 shows that her CMP is within normal limits with a serum albumin of 4.4. The previous CBC was within normal limits with a WBC count of 4.3 hemoglobin of 14 hematocrit of 40.9 and platelets of 202. 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 We will continue with palliative care and make her comfortable as much as possible as far as her wound care goes. Present time there is no odor but if this does occur we would use carbon- [name], [name] ([id number]) based products to mask the odor. Notes were also reviewed from her surgeon Dr. Hervey Ard --closure on 05/27/2014 for breast biopsy in view of the fact that she had extensive disease on her chest wall. Biopsies are taken from the nodular metastatic disease overlying the sternum and nodules chosen for biopsy. Punch biopsies were taken and the pathology on this confirmed that this was consistent with carcinoma 08/15/2014 -- she is started on a new course of injectable chemotherapy and this is 2 weeks on and 2 weeks off. She has no new symptoms no bleeding from the wounds nor is there any odor. She is doing fine otherwise. 09/16/2014. She is doing very well and the right side wounds have completely healed. She has finished 2 cycles of chemotherapy and she is 2 weeks on and 2 weeks off. He is to restart her chemotherapy this week. No fresh complaints pain is within control and there is no odor from the wounds. 10/21/2014 -- overall she is doing very well and is on a  clinical trial with her medical oncologist. No issues with the wound on her left side and there is no odor or drainage. 11/25/2014 -- she continues to be under clinical trial protocol with the medical oncologist and has no fresh issues. 02/17/2015 -- she has been taken off the clinical trial protocol and now is on Taxol on a regular basis with her medical oncologists Dr. Susy Manor. 05/19/14; the patient follow see her for a nonhealing area on the left lateral chest. 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 She has been using Aquacel Ag on this area on a palliative basis. No debridement has been done. Electronic Signature(s) Signed: 05/20/2015 3:38:56 PM By: Linton Ham MD Entered By: Linton Ham on 05/20/2015 12:41:45 [name] [name] ([id number]) -------------------------------------------------------------------------------- Physical Exam Details Patient Name: [name] [name] Date of Service: 05/20/2015 10:00 AM Medical Record Patient Account Number: [id number] Number: Treating RN: Baruch Gouty, RN, BSN, Rita 1950-07-07 [phone] y.o. Other Clinician: Date of Birth/Sex: Female) Treating Chancelor Hardrick Primary Care Physician/Extender: G PATIENT, NO Physician: Referring Physician: Weeks in Treatment: 44 Notes Wound exam; the area on the left lateral chest shows a dime-sized area which has a adherent eschar. I did not debridement this. The patient states that this is metastatic breast cancer and has been biopsy-proven. She has had radiation to  the area also Electronic Signature(s) Signed: 05/20/2015 3:38:56 PM By: Linton Ham MD Entered By: Linton Ham on 05/20/2015 12:42:42 [name] [name] ([id number]) -------------------------------------------------------------------------------- Physician Orders Details Patient Name: [name] [name] Date of Service: 05/20/2015 10:00 AM Medical Record Patient Account  Number: [id number] Number: Treating RN: Baruch Gouty, RN, BSN, Rita [birth year] [phone] y.o. Other Clinician: Date of Birth/Sex: Female) Treating ROBSON, MICHAEL Primary Care Physician/Extender: G PATIENT, NO Physician: Referring Physician: Weeks in Treatment: 49 Verbal / Phone Orders: Yes Clinician: Afful, RN, BSN, Rita Read Back and Verified: Yes Diagnosis Coding Wound Cleansing Wound #1 Left Chest o Clean wound with Normal Saline. Primary Wound Dressing Wound #1 Left Chest o Aquacel Ag Secondary Dressing Wound #1 Left Chest o Boardered Foam Dressing Dressing Change Frequency Wound #1 Left Chest o Change dressing every other day. - or more if needed Follow-up Appointments Wound #1 Left Chest o Return Appointment in 1 month Electronic Signature(s) Signed: 05/20/2015 3:17:41 PM By: Regan Lemming BSN, RN Signed: 05/20/2015 3:38:56 PM By: Linton Ham MD Entered By: Regan Lemming on 05/20/2015 10:32:50 [name] [name] ([id number]) -------------------------------------------------------------------------------- Problem List Details Patient Name: [name] [name] Date of Service: 05/20/2015 10:00 AM Medical Record Patient Account Number: [id number] Number: Treating RN: Baruch Gouty, RN, BSN, Rita December 16, 1950 [phone] y.o. Other Clinician: Date of Birth/Sex: Female) Treating ROBSON, MICHAEL Primary Care Physician/Extender: G PATIENT, NO Physician: Referring Physician: Weeks in Treatment: 30 Active Problems ICD-10 Encounter Code Description Active Date Diagnosis C50.812 Malignant neoplasm of overlapping sites of left female 07/11/2014 Yes breast S21.001A Unspecified open wound of right breast, initial encounter 07/11/2014 Yes C50.412 Malignant neoplasm of upper-outer quadrant of left female 07/11/2014 Yes breast S21.002A Unspecified open wound of left breast, initial encounter 07/11/2014 Yes Inactive Problems Resolved Problems Electronic Signature(s) Signed:  05/20/2015 3:38:56 PM By: Linton Ham MD Entered By: Linton Ham on 05/20/2015 12:40:17 [name] [name] ([id number]) -------------------------------------------------------------------------------- Progress Note Details Patient Name: [name] [name] Date of Service: 05/20/2015 10:00 AM Medical Record Patient Account Number: [id number] Number: Treating RN: Baruch Gouty, RN, BSN, Rita 1950/08/06 [phone] y.o. 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 07/18/2014 -- since last week the patient has had no complaints and is on her last cycle of chemotherapy. I have reviewed 155 pages of her reports from a medical oncologist and the summary is made in the history of present illness. History of Present Illness (HPI) this 65 year old patient has come by herself today as a self-referral and has no documentation with her. She has had open wounds to her left chest and the right chest wall for about 2 months. She is not a diabetic and has no significant medical history except breast cancer. Her right breast cancer was diagnosed 18 years ago and she had a mastectomy and chemotherapy. 3 years ago she was then diagnosed with a breast cancer of the left side which was advanced and could not be operated and was given chemotherapy for a year and then followed by radiation. Her last radiation was over a year ago. She has not had any biopsy done recently from the ulcerated area but she says there have been other biopsies done  from the chest wall and these reports are not available at the present  time. She was told to keep the wounds open and let them dry out but she is finding it's more and more difficult to stop soiling her clothes if she leaves wounds open. She is on chemotherapy and we will try and obtain these notes from her oncologist. She is not in pain and does not have any significant problems except the discomfort from an open wound on the chest wall. 07/18/2014 This is a summary of [name] [name] date of birth 1950/10/29. These are obtained by reviewing 151 pages of medical records which were obtained from the [facility] 65 year old patient who had a history of stage I right breast cancer status post modified radical mastectomy on 06/12/1996. 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 She received 4 cycles of AC at [facility]. In January 2014 she presented with left breast swelling and apparently cellulitis. Breast biopsy done revealed lobular carcinoma which was ER and PR positive and HER-2/neu negative. On PET scan and she was shown to have multiple areas of disease left breast, axilla and supraclavicular areas. She received 6 cycles of chemotherapy and then received Femara because she was not a surgical candidate. PET scan also revealed disease in the left lateral breast and new cervical lymph nodes at level II. The patient was then sent for radiation therapy and received this over a month. In January 2016 she had a PET CT scan done which was compatible with marked progression of disease [name] [name], [name] ([id number]) with multifocal soft tissue masses in the chest wall left greater than right. She is currently receiving Xeloda and is noted to have disease in the chest wall which is widely present. Her most recent lab work from 07/02/2014 shows that her CMP is within normal limits with a serum albumin of 4.4. The previous CBC was within normal limits with a WBC count of 4.3 hemoglobin of 14 hematocrit of 40.9  and platelets of 202. 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 We will continue with palliative care and make her comfortable as much as possible as far as her wound care goes. Present time there is no odor but if this does occur we would use carbon- based products to mask the odor. Notes were also reviewed from her surgeon Dr. Hervey Ard --closure on 05/27/2014 for breast biopsy in view of the fact that she had extensive disease on her chest wall. Biopsies are taken from the nodular metastatic disease overlying the sternum and nodules chosen for biopsy. Punch biopsies were taken and the pathology on this confirmed that this was consistent with carcinoma 08/15/2014 -- she is started on a new course of injectable chemotherapy and this is 2 weeks on and 2 weeks off. She has no new symptoms no bleeding from the wounds nor is there any odor. She is doing fine otherwise. 09/16/2014. She is doing very well and the right side wounds have completely healed. She has finished 2 cycles of chemotherapy and she is 2 weeks on and 2 weeks off. He is to restart her chemotherapy this week. No fresh complaints pain is within control and there is no odor from the wounds. 10/21/2014 -- overall she is doing very  well and is on a clinical trial with her medical oncologist. No issues with the wound on her left side and there is no odor or drainage. 11/25/2014 -- she continues to be under clinical trial protocol with the medical oncologist and has no fresh issues. 02/17/2015 -- she has been taken off the clinical trial protocol and now is on Taxol on a regular basis with her medical oncologists Dr. Susy Manor. 05/19/14; the patient follow see her for a nonhealing area on the left lateral chest. This apparently has  been biopsied and found to be a skin metastasis related to her underlying breast cancer. She has been using Aquacel Ag on this area on a palliative basis. No debridement has been done. Objective Constitutional Vitals Time Taken: 10:24 AM, Height: 68 in, Weight: 180.5 lbs, BMI: 27.4, Temperature: 98 F, Pulse: 82 bpm, Respiratory Rate: 16 breaths/min, Blood Pressure: 124/80 mmHg. Integumentary (Hair, Skin) Wound #1 status is Open. Original cause of wound was Other Lesion. The wound is located on the Left Chest. The wound measures 1cm length x 1.2cm width x 0.1cm depth; 0.942cm^2 area and 0.094cm^3 volume. The wound is limited to skin breakdown. There is no tunneling or undermining noted. There is a medium amount of serosanguineous drainage noted. The wound margin is distinct with the outline attached [name], [name] ([id number]) to the wound base. There is large (67-100%) red granulation within the wound bed. There is a small (1-33%) amount of necrotic tissue within the wound bed including Adherent Slough. The periwound skin appearance had no abnormalities noted for moisture. The periwound skin appearance had no abnormalities noted for color. The periwound skin appearance exhibited: Induration, Scarring. The periwound skin appearance did not exhibit: Callus, Crepitus, Excoriation, Fluctuance, Friable, Localized Edema, Rash. Periwound temperature was noted as No Abnormality. Assessment Active Problems ICD-10 C50.812 - Malignant neoplasm of overlapping sites of left female breast S21.001A - Unspecified open wound of right breast, initial encounter C50.412 - Malignant neoplasm of upper-outer quadrant of left female breast S21.002A - Unspecified open wound of left breast, initial encounter Plan Wound Cleansing: Wound #1 Left Chest: Clean wound with Normal Saline. Primary Wound Dressing: Wound #1 Left Chest: Aquacel Ag Secondary Dressing: Wound #1 Left Chest: Boardered Foam  Dressing Dressing Change Frequency: Wound #1 Left Chest: Change dressing every other day. - or more if needed Follow-up Appointments: Wound #1 Left Chest: Return Appointment in 1 month [name], [name] ([id number]) #1 no change to the palliative orders here Aquacel Ag that the patient is applying. She comes to see [place] on a monthly basis so we can reorder her product. I'd like to have a quick look at some point to make sure that this was actually biopsy-proven to be a metastasis but beyond that no additional treatment was ordered Electronic Signature(s) Signed: 05/20/2015 3:38:56 PM By: Linton Ham MD Entered By: Linton Ham on 05/20/2015 12:43:39 [name] [name] ([id number]) -------------------------------------------------------------------------------- SuperBill Details Patient Name: [name] [name] Date of Service: 05/20/2015 Medical Record Patient Account Number: [id number] Number: Treating RN: Baruch Gouty RN, BSN, Rita 16-May-1950 [phone] y.o. Other Clinician: Date of Birth/Sex: Female) Treating Lasharn Bufkin Primary Care Physician/Extender: G PATIENT, NO Physician: Weeks in Treatment: 50 Referring Physician: Diagnosis Coding ICD-10 Codes Code Description C50.812 Malignant neoplasm of overlapping sites of left female breast S21.001A Unspecified open wound of right breast, initial encounter C50.412 Malignant neoplasm of upper-outer quadrant of left female breast S21.002A Unspecified open wound of left breast, initial encounter [place] Code: [id number] Description: [phone] -  WOUND CARE VISIT-LEV 2 EST PT Modifier: Quantity: 1 Physician Procedures CPT4 Code Description: [id number] - WC PHYS LEVEL 2 - EST PT ICD-10 Description Diagnosis C50.412 Malignant neoplasm of upper-outer quadrant of le Modifier: ft female brea Quantity: 1 st Electronic Signature(s) Signed: 05/20/2015 3:38:56 PM By: Linton Ham MD Entered By: Linton Ham on  05/20/2015 12:44:16

## 2015-05-22 ENCOUNTER — Inpatient Hospital Stay: Payer: Medicare Other | Attending: Hematology and Oncology

## 2015-05-22 ENCOUNTER — Other Ambulatory Visit: Payer: Self-pay | Admitting: Hematology and Oncology

## 2015-05-22 ENCOUNTER — Ambulatory Visit: Payer: Medicare Other | Admitting: Radiation Oncology

## 2015-05-22 ENCOUNTER — Inpatient Hospital Stay: Payer: Medicare Other

## 2015-05-22 ENCOUNTER — Inpatient Hospital Stay: Admission: RE | Admit: 2015-05-22 | Payer: Medicare Other | Source: Ambulatory Visit | Admitting: Radiation Oncology

## 2015-05-22 DIAGNOSIS — Z79899 Other long term (current) drug therapy: Secondary | ICD-10-CM | POA: Diagnosis not present

## 2015-05-22 DIAGNOSIS — C50912 Malignant neoplasm of unspecified site of left female breast: Secondary | ICD-10-CM

## 2015-05-22 DIAGNOSIS — Z170 Estrogen receptor positive status [ER+]: Secondary | ICD-10-CM | POA: Diagnosis not present

## 2015-05-22 DIAGNOSIS — C50412 Malignant neoplasm of upper-outer quadrant of left female breast: Secondary | ICD-10-CM | POA: Insufficient documentation

## 2015-05-22 DIAGNOSIS — C50112 Malignant neoplasm of central portion of left female breast: Secondary | ICD-10-CM

## 2015-05-22 LAB — CBC WITH DIFFERENTIAL/PLATELET
Basophils Absolute: 0 10*3/uL (ref 0–0.1)
Basophils Relative: 0 %
Eosinophils Absolute: 0.1 10*3/uL (ref 0–0.7)
Eosinophils Relative: 2 %
HCT: 39.7 % (ref 35.0–47.0)
Hemoglobin: 13.3 g/dL (ref 12.0–16.0)
Lymphocytes Relative: 38 %
Lymphs Abs: 1.6 10*3/uL (ref 1.0–3.6)
MCH: 28.5 pg (ref 26.0–34.0)
MCHC: 33.6 g/dL (ref 32.0–36.0)
MCV: 84.9 fL (ref 80.0–100.0)
Monocytes Absolute: 0.5 10*3/uL (ref 0.2–0.9)
Monocytes Relative: 13 %
Neutro Abs: 2 10*3/uL (ref 1.4–6.5)
Neutrophils Relative %: 47 %
Platelets: 182 10*3/uL (ref 150–440)
RBC: 4.68 MIL/uL (ref 3.80–5.20)
RDW: 17 % — ABNORMAL HIGH (ref 11.5–14.5)
WBC: 4.3 10*3/uL (ref 3.6–11.0)

## 2015-05-22 LAB — COMPREHENSIVE METABOLIC PANEL
ALT: 27 U/L (ref 14–54)
AST: 27 U/L (ref 15–41)
Albumin: 4 g/dL (ref 3.5–5.0)
Alkaline Phosphatase: 125 U/L (ref 38–126)
Anion gap: 8 (ref 5–15)
BUN: 21 mg/dL — ABNORMAL HIGH (ref 6–20)
CO2: 26 mmol/L (ref 22–32)
Calcium: 9.6 mg/dL (ref 8.9–10.3)
Chloride: 107 mmol/L (ref 101–111)
Creatinine, Ser: 0.88 mg/dL (ref 0.44–1.00)
GFR calc Af Amer: >60 mL/min (ref >60)
GFR calc non Af Amer: >60 mL/min (ref >60)
Glucose, Bld: 95 mg/dL (ref 65–99)
Potassium: 3.6 mmol/L (ref 3.5–5.1)
Sodium: 141 mmol/L (ref 135–145)
Total Bilirubin: 0.6 mg/dL (ref 0.3–1.2)
Total Protein: 7.7 g/dL (ref 6.5–8.1)

## 2015-05-22 MED ORDER — FULVESTRANT 250 MG/5ML IM SOLN
500.0000 mg | INTRAMUSCULAR | Status: DC
  Administered 2015-05-22: 500 mg via INTRAMUSCULAR
  Filled 2015-05-22: qty 10

## 2015-05-23 ENCOUNTER — Ambulatory Visit: Payer: Medicare Other | Admitting: Radiation Oncology

## 2015-05-23 LAB — CANCER ANTIGEN 27.29: CA 27.29: 141.6 U/mL — ABNORMAL HIGH (ref 0.0–38.6)

## 2015-05-25 ENCOUNTER — Other Ambulatory Visit: Payer: Self-pay | Admitting: Hematology and Oncology

## 2015-05-26 ENCOUNTER — Telehealth: Payer: Self-pay | Admitting: *Deleted

## 2015-05-26 NOTE — Telephone Encounter (Signed)
Call returned to pt and instructed per VO Dr Grayland Ormond that this has nothing to do with her breast cancer and she should go to PCP or to an Urgent care center for evaluation. She stated "alright, Thank you"

## 2015-05-26 NOTE — Telephone Encounter (Signed)
Called to report that she has a knot above the tip of her ear on her scalp that is the size of the tip of her finger which gets bigger and then she squeezes it adn pus and clear to bloody fluid comes out. Asking if she can be seen

## 2015-05-29 ENCOUNTER — Ambulatory Visit: Payer: Medicare Other | Attending: Radiation Oncology | Admitting: Radiation Oncology

## 2015-06-06 ENCOUNTER — Other Ambulatory Visit: Payer: Self-pay

## 2015-06-06 DIAGNOSIS — C50912 Malignant neoplasm of unspecified site of left female breast: Secondary | ICD-10-CM

## 2015-06-12 ENCOUNTER — Other Ambulatory Visit: Payer: Self-pay | Admitting: Hematology and Oncology

## 2015-06-12 ENCOUNTER — Inpatient Hospital Stay: Payer: Medicare Other

## 2015-06-12 ENCOUNTER — Inpatient Hospital Stay: Payer: Medicare Other | Attending: Hematology and Oncology

## 2015-06-12 ENCOUNTER — Inpatient Hospital Stay (HOSPITAL_BASED_OUTPATIENT_CLINIC_OR_DEPARTMENT_OTHER): Payer: Medicare Other | Admitting: Hematology and Oncology

## 2015-06-12 VITALS — BP 146/81 | HR 99 | Temp 97.6°F | Resp 18 | Ht 67.0 in | Wt 169.3 lb

## 2015-06-12 DIAGNOSIS — C50912 Malignant neoplasm of unspecified site of left female breast: Secondary | ICD-10-CM

## 2015-06-12 DIAGNOSIS — Z79899 Other long term (current) drug therapy: Secondary | ICD-10-CM

## 2015-06-12 DIAGNOSIS — Z9221 Personal history of antineoplastic chemotherapy: Secondary | ICD-10-CM

## 2015-06-12 DIAGNOSIS — Z79818 Long term (current) use of other agents affecting estrogen receptors and estrogen levels: Secondary | ICD-10-CM | POA: Insufficient documentation

## 2015-06-12 DIAGNOSIS — Z853 Personal history of malignant neoplasm of breast: Secondary | ICD-10-CM

## 2015-06-12 DIAGNOSIS — Z452 Encounter for adjustment and management of vascular access device: Secondary | ICD-10-CM | POA: Insufficient documentation

## 2015-06-12 DIAGNOSIS — F17210 Nicotine dependence, cigarettes, uncomplicated: Secondary | ICD-10-CM | POA: Diagnosis not present

## 2015-06-12 DIAGNOSIS — Z170 Estrogen receptor positive status [ER+]: Secondary | ICD-10-CM

## 2015-06-12 DIAGNOSIS — C50112 Malignant neoplasm of central portion of left female breast: Secondary | ICD-10-CM

## 2015-06-12 DIAGNOSIS — C50412 Malignant neoplasm of upper-outer quadrant of left female breast: Secondary | ICD-10-CM | POA: Insufficient documentation

## 2015-06-12 DIAGNOSIS — R918 Other nonspecific abnormal finding of lung field: Secondary | ICD-10-CM | POA: Insufficient documentation

## 2015-06-12 DIAGNOSIS — Z9011 Acquired absence of right breast and nipple: Secondary | ICD-10-CM | POA: Diagnosis not present

## 2015-06-12 DIAGNOSIS — G629 Polyneuropathy, unspecified: Secondary | ICD-10-CM | POA: Insufficient documentation

## 2015-06-12 LAB — CBC WITH DIFFERENTIAL/PLATELET
Basophils Absolute: 0.1 10*3/uL (ref 0–0.1)
Basophils Relative: 1 %
Eosinophils Absolute: 0.1 10*3/uL (ref 0–0.7)
Eosinophils Relative: 2 %
HCT: 39.6 % (ref 35.0–47.0)
Hemoglobin: 13.3 g/dL (ref 12.0–16.0)
Lymphocytes Relative: 37 %
Lymphs Abs: 1.7 10*3/uL (ref 1.0–3.6)
MCH: 28.8 pg (ref 26.0–34.0)
MCHC: 33.6 g/dL (ref 32.0–36.0)
MCV: 85.8 fL (ref 80.0–100.0)
Monocytes Absolute: 0.5 10*3/uL (ref 0.2–0.9)
Monocytes Relative: 11 %
Neutro Abs: 2.3 10*3/uL (ref 1.4–6.5)
Neutrophils Relative %: 49 %
Platelets: 176 10*3/uL (ref 150–440)
RBC: 4.61 MIL/uL (ref 3.80–5.20)
RDW: 16 % — ABNORMAL HIGH (ref 11.5–14.5)
WBC: 4.7 10*3/uL (ref 3.6–11.0)

## 2015-06-12 LAB — COMPREHENSIVE METABOLIC PANEL
ALT: 22 U/L (ref 14–54)
AST: 25 U/L (ref 15–41)
Albumin: 3.9 g/dL (ref 3.5–5.0)
Alkaline Phosphatase: 105 U/L (ref 38–126)
Anion gap: 6 (ref 5–15)
BUN: 17 mg/dL (ref 6–20)
CO2: 26 mmol/L (ref 22–32)
Calcium: 8.7 mg/dL — ABNORMAL LOW (ref 8.9–10.3)
Chloride: 103 mmol/L (ref 101–111)
Creatinine, Ser: 0.99 mg/dL (ref 0.44–1.00)
GFR calc Af Amer: >60 mL/min (ref >60)
GFR calc non Af Amer: 60 mL/min — ABNORMAL LOW (ref >60)
Glucose, Bld: 99 mg/dL (ref 65–99)
Potassium: 3.6 mmol/L (ref 3.5–5.1)
Sodium: 135 mmol/L (ref 135–145)
Total Bilirubin: 0.3 mg/dL (ref 0.3–1.2)
Total Protein: 7.7 g/dL (ref 6.5–8.1)

## 2015-06-12 MED ORDER — FULVESTRANT 250 MG/5ML IM SOLN
500.0000 mg | INTRAMUSCULAR | Status: DC
  Administered 2015-06-12: 500 mg via INTRAMUSCULAR
  Filled 2015-06-12: qty 10

## 2015-06-12 NOTE — Progress Notes (Signed)
Pt consented to photos taken today of breast neck region.

## 2015-06-12 NOTE — Progress Notes (Signed)
[facility] day:  06/12/2015  Chief Complaint: [name] [name] is a 65 y.o. female with recurrent breast cancer who is seen for 1 month assessment and continuation of Faslodex.  HPI: The patient was last seen in the medical oncology clinic on 05/08/2015.  At that time, she was seen for reassessment.  Symptomatically, she was recovering from a URI. She had new small chest wall lesions.  Foundation One testing was pending.  We discussed a trial of hormonal therapy (Faslodex or Aromasin + Everolimus).  She began Faslodex on 05/08/2015.  She completed her loading dose of Faslodex on 05/22/2015.    She has done well.  She denies any concerns.  Past Medical History  Diagnosis Date  . Personal history of malignant neoplasm of breast   . Anemia   . Blood transfusion without reported diagnosis   . Emphysema of lung ([place])   . Breast cancer [facility])     Past Surgical History  Procedure Laterality Date  . Oophrectomy  1980  . Abdominal hysterectomy  1995  . Breast surgery Right 1996    mastectomy  . Breast biopsy Left 2014  . Breast biopsy Left 01-21-14    Family History  Problem Relation Age of Onset  . Cancer Father   . Heart disease Sister   . Diabetes Sister   . Heart disease Brother   . Cancer Paternal Grandmother     breast    Social History:  reports that she has been smoking Cigarettes.  She has a 6.25 pack-year smoking history. She has never used smokeless tobacco. She reports that she does not drink alcohol or use illicit drugs.  She is alone today.  Allergies:  Allergies  Allergen Reactions  . Sulfa Antibiotics Nausea And Vomiting    headache    Current Medications: Current Outpatient Prescriptions  Medication Sig Dispense Refill  . Calcium Carbonate-Vitamin D (CALCIUM + D PO) Take 2 tablets by mouth daily.    Marland Kitchen KLOR-CON 10 10 MEQ tablet TAKE 1 TABLET (10 MEQ TOTAL) BY MOUTH DAILY. 30 tablet 1  . LORazepam (ATIVAN) 0.5  MG tablet   0  . traMADol (ULTRAM) 50 MG tablet Take 1 tablet (50 mg total) by mouth every 8 (eight) hours as needed. 90 tablet 0   No current facility-administered medications for this visit.   Facility-Administered Medications Ordered in Other Visits  Medication Dose Route Frequency Provider Last Rate Last Dose  . sodium chloride 0.9 % injection 10 mL  10 mL Intravenous PRN Lequita Asal, MD   10 mL at 10/08/14 1335  . sodium chloride 0.9 % injection 10 mL  10 mL Intracatheter PRN Lequita Asal, MD   10 mL at 11/26/14 1355    Review of Systems:  GENERAL:  Feels good.  No fevers or sweats.  Weight up 2 pounds. PERFORMANCE STATUS (ECOG):  1 HEENT:  No visual changes, runny nose, sore throat, mouth sores or tenderness. Lungs: No shortness of breath.  No cough.  No hemoptysis. Cardiac:  No chest pain, palpitations, orthopnea, or PND. GI:  No nausea, vomiting, diarrhea, constipation, melena or hematochezia. GU:  No urgency, frequency, dysuria, or hematuria. Musculoskeletal:  No back pain.  No joint pain.  No muscle tenderness. Extremities:  No pain or swelling. Skin:  Hair thin.  Spot on back of head oozed and bled (treated with doxycycline).  No rashes or skin changes. 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  No headache, numbness or weakness, or coordination issues. Endocrine:  No diabetes, thyroid issues, hot flashes or night sweats. Psych:  No mood changes, depression or anxiety. Pain: No complaints. Review of systems:  All other systems reviewed and found to be negative.   Physical Exam: Blood pressure 146/81, pulse 99, temperature 97.6 F (36.4 C), temperature source Tympanic, resp. rate 18, height 5' 7"  (1.702 m), weight 169 lb 5 oz (76.8 kg).  GENERAL: Well developed, well nourished, sitting comfortably in the exam room in no acute distress. MENTAL STATUS: Alert and oriented to person, place and time. HEAD: Short gray wig. Normocephalic, atraumatic, face  symmetric, no Cushingoid features. EYES: Brown eyes. Pupils equal round and reactive to light and accomodation.  No conjunctivitis or scleral icterus. ENT: Oropharynx clear without lesion. Tongue normal. Mucous membranes moist.  NECK:  Left neck cyst (stable). BREAST: Right chest wall lesions healed. Central upper sternal area flat.  Left 1.5 cm axillary nodule (stable).  Chest wall thickened.  Fingertip nodule (< 1 cm) in skin below left clavicle. Left lateral denuded area triangular in shape healed (1.5 x 1 cm). SKIN: Cutaneous breast cancer noted in breast section. No other lesions.  EXTREMITIES: No edema, no skin discoloration or tenderness. No palpable cords. LYMPH NODES: Left axillary ridge. No palpable cervical, supraclavicular, axillary or inguinal adenopathy  NEUROLOGICAL: Unremarkable.  PSYCH: Appropriate.  Appointment on 06/12/2015  Component Date Value Ref Range Status  . WBC 06/12/2015 4.7  3.6 - 11.0 K/uL Final  . RBC 06/12/2015 4.61  3.80 - 5.20 MIL/uL Final  . Hemoglobin 06/12/2015 13.3  12.0 - 16.0 g/dL Final  . HCT 06/12/2015 39.6  35.0 - 47.0 % Final  . MCV 06/12/2015 85.8  80.0 - 100.0 fL Final  . MCH 06/12/2015 28.8  26.0 - 34.0 pg Final  . MCHC 06/12/2015 33.6  32.0 - 36.0 g/dL Final  . RDW 06/12/2015 16.0* 11.5 - 14.5 % Final  . Platelets 06/12/2015 176  150 - 440 K/uL Final  . Neutrophils Relative % 06/12/2015 49   Final  . Neutro Abs 06/12/2015 2.3  1.4 - 6.5 K/uL Final  . Lymphocytes Relative 06/12/2015 37   Final  . Lymphs Abs 06/12/2015 1.7  1.0 - 3.6 K/uL Final  . Monocytes Relative 06/12/2015 11   Final  . Monocytes Absolute 06/12/2015 0.5  0.2 - 0.9 K/uL Final  . Eosinophils Relative 06/12/2015 2   Final  . Eosinophils Absolute 06/12/2015 0.1  0 - 0.7 K/uL Final  . Basophils Relative 06/12/2015 1   Final  . Basophils Absolute 06/12/2015 0.1  0 - 0.1 K/uL Final  . Sodium 06/12/2015 135  135 - 145 mmol/L Final  . Potassium 06/12/2015 3.6  3.5  - 5.1 mmol/L Final  . Chloride 06/12/2015 103  101 - 111 mmol/L Final  . CO2 06/12/2015 26  22 - 32 mmol/L Final  . Glucose, Bld 06/12/2015 99  65 - 99 mg/dL Final  . BUN 06/12/2015 17  6 - 20 mg/dL Final  . Creatinine, Ser 06/12/2015 0.99  0.44 - 1.00 mg/dL Final  . Calcium 06/12/2015 8.7* 8.9 - 10.3 mg/dL Final  . Total Protein 06/12/2015 7.7  6.5 - 8.1 g/dL Final  . Albumin 06/12/2015 3.9  3.5 - 5.0 g/dL Final  . AST 06/12/2015 25  15 - 41 U/L Final  . ALT 06/12/2015 22  14 - 54 U/L Final  . Alkaline Phosphatase 06/12/2015 105  38 - 126 U/L Final  . Total  Bilirubin 06/12/2015 0.3  0.3 - 1.2 mg/dL Final  . GFR calc non Af Amer 06/12/2015 60* >60 mL/min Final  . GFR calc Af Amer 06/12/2015 >60  >60 mL/min Final   Comment: (NOTE) The eGFR has been calculated using the CKD EPI equation. This calculation has not been validated in all clinical situations. eGFR's persistently <60 mL/min signify possible Chronic Kidney Disease.   . Anion gap 06/12/2015 6  5 - 15 Final  . CA 27.29 06/12/2015 258.6* 0.0 - 38.6 U/mL Final   Comment: (NOTE) Bayer Centaur/ACS methodology Performed At: [facility] [address] [place], [place] [id number] Lindon Romp MD BE:[id number]     Assessment:  [name] [name] is a 65 y.o. African American woman with a history of stage I right breast cancer s/p modified radical mastectomy on 06/12/1996. Pathology revealed a grade III mutifocal infiltrating ductal carcinoma (tumor size 8.5 cm with an invasive component 1.6 cm). Twenty-one lymph nodes were negative. Tumor was ER/PR negative. She received 4 cycles of AC at [facility].  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 PET scan on 05/22/2012 revealed multiple areas of disease. She received 6 cycles of Taxotere and Cytoxan (TC) from 06/06/2012 - 09/29/2012. PET scan on 09/07/2012 noted  marked improvement. Post chemotherapy biopsy was positive. She was not a surgical candidate. She was placed on Femara.  PET scan on 05/03/2013 revealed a new focus along lateral left breast and a new level II cervical lymph node. She received radiation from 09/13/2013 - 11/05/2013. PET scan on 01/01/2014 revealed new nodule medial left chest wall pleural/paraspinal activity LUL. Multiple biopsies on 01/19/2014 were positive. She began daily letrozole and Ibrance on 02/12/2014. Despite treatment, new nodules formed. She received a total of 2 cycles.   PET scan on 05/09/2014 revealed marked progression of disease with multifocal masses in chest wall (left > right). She did not receive Imbrance in 04/2014. CA27.29 has increased: 22.9 on 01/08/2014, 54 on 03/05/2014, 170.8 on 05/06/2014, 250.8 on 05/23/2014, 445.8 on 06/14/2014, and 531.1 on 07/02/2014.   She received 3 cycles of Xeloda (02/12 - 07/05/2014). Chest and abdomen CT scan on 07/05/2014 revealed persistent and slightly progressive diffuse locally invasive chest wall tumor. Bone scan on 07/25/2014 revealed no evidence of metastatic disease.   She received 8 cycles of [place] (08/06/2014 - 01/07/2015) on ACCRU [id number] I. 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  Chest, abdomen, and pelvic CT scan on 10/24/2014 revealed improved multifocal inflammatory left breast cancer.  The dominant 2.6 x 3.5 cm lesion in the left upper outer left breast had decreased to 1.7 x 3.5 cm. The anterior sternal lesion has decreased from 2 x 4 cm to 1.5 x 3.9 cm. There were no suspicious mediastinal, hilar or axillary adenopathy.    Chest, abdomen, and pelvic CT scan on 01/20/2015 revealed inflammatory left breast cancer with new necrotic appearing subcutaneous nodules in the lateral left breast and lateral left chest wall. CA27.29 was 339.8 on 08/06/2014, 51.7 on 10/15/2014, 37.6 on 12/10/2014, 26.4 on 01/07/2015, 34.1 on 01/28/2015, and 49.3  on 03/10/2015, and 258.6 on 06/12/2015.   She received 2 cycles of Taxol given 3 weeks on/1 week off (02/10/2015 - 03/10/2015).  Chest and abdomen CT scan on 04/03/2015 revealed interval growth of superficial tumors in the lateral left breast and lateral left chest wall.  In the left lateral chest, disease had increased from 2.3 x  2 to 2.9 x 2.2 cm.  In the superficial lateral left chest wall, disease had increased from 1.7 x 1.2 cm to 2.7 x 1.8 cm.  There were stable findings of locally infiltrative tumor in the left chest wall and left axilla, including extensive left chest wall skin thickening, infiltrative soft tissue encasing the left axillary artery and focal pleural thickening in the anterior left mid pleural space.  There was no metastatic disease in the abdomen.  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  Estrogen receptor was > 90%, progesterone receptor was > 90%, and Her2/neu was 1+.  Foundation One testing noted treatment options including Faslodex.  She began Faslodex on 05/08/2015 (last 05/22/2015).  Symptomatically, she is doing well.  She has persistent chest wall nodularity.  Plan: 1.  Labs today: CBC with diff, CMP, CA27.29. 2.  Discuss results of Foundation One testing.  Discuss Faslodex as one of the preferred treatments. 3.  Faslodex today. 4.  Photographs of skin lesions today.  Patient consented. 5.  Port flush every 6-8 weeks. 6.  RTC in 4 weeks for MD assessment, labs (CBC with diff, CMP, CA27.29), and Faslodex.   Lequita Asal, MD  06/12/2015, 1:36 PM

## 2015-06-13 LAB — CANCER ANTIGEN 27.29: CA 27.29: 258.6 U/mL — ABNORMAL HIGH (ref 0.0–38.6)

## 2015-06-17 ENCOUNTER — Encounter: Payer: Medicare Other | Attending: Internal Medicine | Admitting: Internal Medicine

## 2015-06-17 DIAGNOSIS — S21001A Unspecified open wound of right breast, initial encounter: Secondary | ICD-10-CM | POA: Diagnosis not present

## 2015-06-17 DIAGNOSIS — C50412 Malignant neoplasm of upper-outer quadrant of left female breast: Secondary | ICD-10-CM | POA: Diagnosis not present

## 2015-06-17 DIAGNOSIS — C50812 Malignant neoplasm of overlapping sites of left female breast: Secondary | ICD-10-CM | POA: Insufficient documentation

## 2015-06-17 DIAGNOSIS — Z9221 Personal history of antineoplastic chemotherapy: Secondary | ICD-10-CM | POA: Diagnosis not present

## 2015-06-17 DIAGNOSIS — S21002A Unspecified open wound of left breast, initial encounter: Secondary | ICD-10-CM | POA: Diagnosis not present

## 2015-06-17 DIAGNOSIS — X58XXXA Exposure to other specified factors, initial encounter: Secondary | ICD-10-CM | POA: Insufficient documentation

## 2015-06-19 NOTE — Progress Notes (Signed)
[name] [name], [name] [name] ([id number]) Visit Report for 06/17/2015 Chief Complaint Document Details Patient Name: [name] [name], [name] [name] Date of Service: 06/17/2015 10:00 AM Medical Record Patient Account Number: [id number] Number: Treating RN: Baruch Gouty, RN, BSN, Rita 08/07/1950 [phone] y.o. 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 07/18/2014 -- since last week the patient has had no complaints and is on her last cycle of chemotherapy. I have reviewed 155 pages of her reports from a medical oncologist and the summary is made in the history of present illness. Electronic Signature(s) Signed: 06/18/2015 4:53:46 PM By: Linton Ham MD Entered By: Linton Ham on 06/17/2015 12:12:20 [name] [name] ([id number]) -------------------------------------------------------------------------------- Debridement Details Patient Name: [name] [name] Date of Service: 06/17/2015 10:00 AM Medical Record Patient Account Number: [id number] Number: Treating RN: Baruch Gouty, RN, BSN, Rita 1950-10-17 [phone] y.o. Other Clinician: Date of Birth/Sex: Female) Treating Reinhold Rickey Primary Care Physician/Extender: G PATIENT, NO Physician: Referring Physician: Weeks in Treatment: 48 Debridement Performed for Wound #1 Left Chest Assessment: Performed By: Physician Ricard Dillon, MD Debridement: Debridement Pre-procedure Yes Verification/Time Out Taken: Start Time: 09:55 Pain Control: Lidocaine 4% Topical Solution Level: Skin/Subcutaneous Tissue Total Area Debrided (L x 4 (cm) x 4 (cm) = 16  (cm) W): Tissue and other Non-Viable, Exudate, Fibrin/Slough, Subcutaneous material debrided: Instrument: Curette Bleeding: Moderate Hemostasis Achieved: Silver Nitrate End Time: 10:00 Procedural Pain: 0 Post Procedural Pain: 0 Response to Treatment: Procedure was tolerated well Post Debridement Measurements of Total Wound Length: (cm) 4 Width: (cm) 4 Depth: (cm) 0.1 Volume: (cm) 1.257 Post Procedure Diagnosis Same as Pre-procedure Electronic Signature(s) Signed: 06/17/2015 4:45:50 PM By: Regan Lemming BSN, RN Signed: 06/18/2015 4:53:46 PM By: Linton Ham MD Entered By: Linton Ham on 06/17/2015 12:11:57 [name] [name] ([id number]) [name] [name], [name] [name] ([id number]) -------------------------------------------------------------------------------- HPI Details Patient Name: [name] [name] Date of Service: 06/17/2015 10:00 AM Medical Record Patient Account Number: [id number] Number: Treating RN: Baruch Gouty, RN, BSN, Rita 1950/09/30 [phone] y.o. Other Clinician: Date of Birth/Sex: Female) Treating Anya Murphey Primary Care Physician/Extender: G PATIENT, NO Physician: Referring Physician: Weeks in Treatment: 36 History of Present Illness HPI Description: this 65 year old patient has come by herself today as a self-referral and has no documentation with her. She has had open wounds to her left chest and the right chest wall for about 2 months. She is not a diabetic and has no significant medical history except breast cancer. Her right breast cancer was diagnosed 18 years ago and she had a mastectomy and chemotherapy. 3 years ago she was then diagnosed with a breast cancer of the left side which was advanced and could not be operated and was given chemotherapy for a year and then followed by radiation. Her last radiation was over a year ago. She has not had any biopsy done recently from the ulcerated area but she says there have been other biopsies done from the chest  wall and these reports are not available at the present time. She was told to keep the wounds open and let them dry out but she is finding it's more and more difficult to stop soiling her clothes if she leaves wounds open.  She is on chemotherapy and we will try and obtain these notes from her oncologist. She is not in pain and does not have any significant problems except the discomfort from an open wound on the chest wall. 07/18/2014 This is a summary of [name] [name] date of birth January 02, 1951. These are obtained by reviewing 151 pages of medical records which were obtained from the [facility] 65 year old patient who had a history of stage I right breast cancer status post modified radical mastectomy on 06/12/1996. 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 She received 4 cycles of AC at [facility]. In January 2014 she presented with left breast swelling and apparently cellulitis. Breast biopsy done revealed lobular carcinoma which was ER and PR positive and HER-2/neu negative. On PET scan and she was shown to have multiple areas of disease left breast, axilla and supraclavicular areas. She received 6 cycles of chemotherapy and then received Femara because she was not a surgical candidate. PET scan also revealed disease in the left lateral breast and new cervical lymph nodes at level II. The patient was then sent for radiation therapy and received this over a month. In January 2016 she had a PET CT scan done which was compatible with marked progression of disease with multifocal soft tissue masses in the chest wall left greater than right. She is currently receiving Xeloda and is noted to have disease in the chest wall which is widely present. Her most recent lab work from 07/02/2014 shows that her CMP is within normal limits with a serum albumin of 4.4. The previous CBC was within normal limits with a WBC count of 4.3 hemoglobin  of 14 hematocrit of 40.9 and platelets of 202. 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 We will continue with palliative care and make her comfortable as much as possible as far as her wound care goes. Present time there is no odor but if this does occur we would use carbon- [name] [name], [name] ([id number]) based products to mask the odor. Notes were also reviewed from her surgeon Dr. Hervey Ard --closure on 05/27/2014 for breast biopsy in view of the fact that she had extensive disease on her chest wall. Biopsies are taken from the nodular metastatic disease overlying the sternum and nodules chosen for biopsy. Punch biopsies were taken and the pathology on this confirmed that this was consistent with carcinoma 08/15/2014 -- she is started on a new course of injectable chemotherapy and this is 2 weeks on and 2 weeks off. She has no new symptoms no bleeding from the wounds nor is there any odor. She is doing fine otherwise. 09/16/2014. She is doing very well and the right side wounds have completely healed. She has finished 2 cycles of chemotherapy and she is 2 weeks on and 2 weeks off. He is to restart her chemotherapy this week. No fresh complaints pain is within control and there is no odor from the wounds. 10/21/2014 -- overall she is doing very well and is on a clinical trial with her medical oncologist. No issues with the wound on her left side and there is no odor or drainage. 11/25/2014 -- she continues to be under clinical trial protocol with the medical oncologist and has no fresh issues. 02/17/2015 --  she has been taken off the clinical trial protocol and now is on Taxol on a regular basis with her medical oncologists Dr. Susy Manor. 05/20/15; the patient follow see her for a nonhealing  area on the left lateral chest. 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 She has been using Aquacel Ag on this area on a palliative basis. No debridement has been done. 06/17/15; the patient follows in this clinic monthly for a nonhealing area on her left lateral chest. She has known metastatic breast cancer which is metastatic to the chest wall. She returns today with a cluster of open areas on the lateral aspect of her chest. The nurses in the clinic described this as a large open area that progressively closed over. I've taken this opportunity to review her oncology status. On her arrival here she asked me to debridement the wounds on her chest stating that this was a request of [name] her medical oncologist. This is a patient who initially developed stage I right breast cancer treated with a modified radical mastectomy in 1998. She really presented with inflammatory breast cancer in 2014 post chemotherapy biopsy was positive she was not a surgical candidate. Since then she has been treated with multiple regimens. Most particular to her wounds PET scan in January 2016 revealed marked progression of the disease with multifocal masses in the chest wall left greater than right. 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 She has been receiving Faslodex. Weekly. She missed her last injection and is attributing this to the breakdown of tissue on her left chest wall. She was seen by [name] in early March however the record is not complete Electronic Signature(s) Signed: 06/18/2015 4:53:46 PM By: Linton Ham MD Entered By: Linton Ham on 06/17/2015 12:18:53 [name] [name]  ([id number]) -------------------------------------------------------------------------------- Physical Exam Details Patient Name: [name] [name] Date of Service: 06/17/2015 10:00 AM Medical Record Patient Account Number: [id number] Number: Treating RN: Baruch Gouty RN, BSN, Rita Jul 31, 1950 [phone] y.o. Other Clinician: Date of Birth/Sex: Female) Treating Riely Oetken Primary Care Physician/Extender: G PATIENT, NO Physician: Referring Physician: Weeks in Treatment: 48 Constitutional Sitting or standing Blood Pressure is within target range for patient.. Pulse regular and within target range for patient.Marland Kitchen Respirations regular, non-labored and within target range.. Temperature is normal and within the target range for the patient.. Patient's appearance is neat and clean. Appears in no acute distress. Well nourished and well developed.. Lymphatic There was no lymphadenopathy in the left axilla, or clavicular areas.. Notes Wound exam; the original dime-sized area for which we have been following her is still open but actually appears to be smaller. This is further along on her left chest wall. She now has a cluster of new wounds I think surrounding a protruding cauliflower-like tumor. There is also a palpable tumor in the left breast area which is firm but nontender. Electronic Signature(s) Signed: 06/18/2015 4:53:46 PM By: Linton Ham MD Entered By: Linton Ham on 06/17/2015 12:20:49 [name] [name] ([id number]) -------------------------------------------------------------------------------- Physician Orders Details Patient Name: [name] [name] Date of Service: 06/17/2015 10:00 AM Medical Record Patient Account Number: [id number] Number: Treating RN: Baruch Gouty, RN, BSN, Rita 1950/06/12 [phone] y.o. Other Clinician: Date of Birth/Sex: Female) Treating Petrita Blunck Primary Care Physician/Extender:  G PATIENT, NO Physician: Referring Physician: Weeks in  Treatment: 49 Verbal / Phone Orders: Yes Clinician: Afful, RN, BSN, Rita Read Back and Verified: No Diagnosis Coding Wound Cleansing Wound #1 Left Chest o Clean wound with Normal Saline. Primary Wound Dressing Wound #1 Left Chest o Aquacel Ag Secondary Dressing Wound #1 Left Chest o Boardered Foam Dressing Dressing Change Frequency Wound #1 Left Chest o Change dressing every other day. - or more if needed Follow-up Appointments Wound #1 Left Chest o Return Appointment in 1 month Electronic Signature(s) Signed: 06/17/2015 4:45:50 PM By: Regan Lemming BSN, RN Signed: 06/18/2015 4:53:46 PM By: Linton Ham MD Entered By: Regan Lemming on 06/17/2015 10:04:22 [name] [name] ([id number]) -------------------------------------------------------------------------------- Problem List Details Patient Name: [name] [name] Date of Service: 06/17/2015 10:00 AM Medical Record Patient Account Number: [id number] Number: Treating RN: Baruch Gouty, RN, BSN, Rita [birth year] [phone] y.o. Other Clinician: Date of Birth/Sex: Female) Treating Cameron Katayama Primary Care Physician/Extender: G PATIENT, NO Physician: Referring Physician: Weeks in Treatment: 67 Active Problems ICD-10 Encounter Code Description Active Date Diagnosis C50.812 Malignant neoplasm of overlapping sites of left female 07/11/2014 Yes breast S21.001A Unspecified open wound of right breast, initial encounter 07/11/2014 Yes C50.412 Malignant neoplasm of upper-outer quadrant of left female 07/11/2014 Yes breast S21.002A Unspecified open wound of left breast, initial encounter 07/11/2014 Yes Inactive Problems Resolved Problems Electronic Signature(s) Signed: 06/18/2015 4:53:46 PM By: Linton Ham MD Entered By: Linton Ham on 06/17/2015 12:11:16 [name] [name] ([id number]) -------------------------------------------------------------------------------- Progress Note Details Patient Name: [name] [name] Date of Service: 06/17/2015 10:00 AM Medical Record Patient Account Number: [id number] Number: Treating RN: Baruch Gouty, RN, BSN, Rita September 13, 1950 [phone] y.o. 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 07/18/2014 -- since last week the patient has had no complaints and is on her last cycle of chemotherapy. I have reviewed 155 pages of her reports from a medical oncologist and the summary is made in the history of present illness. History of Present Illness (HPI) this 65 year old patient has come by herself today as a self-referral and has no documentation with her. She has had open wounds to her left chest and the right chest wall for about 2 months. She is not a diabetic and has no significant medical history except breast cancer. Her right breast cancer was diagnosed 18 years ago and she had a mastectomy and chemotherapy. 3 years ago she was then diagnosed with a breast cancer of the left side which was advanced and could not be operated and was given chemotherapy for a year and then followed by radiation. Her last radiation was over a year ago. She has not had any biopsy done recently from the ulcerated area but she says there have been other biopsies done from the chest wall and these reports are not available at the present time. She was told to keep the wounds open and let them dry out but she is finding it's more and more difficult to stop soiling her clothes if she leaves wounds open. She is on chemotherapy and we will try and obtain these notes from her oncologist. She is not  in pain and  does not have any significant problems except the discomfort from an open wound on the chest wall. 07/18/2014 This is a summary of [name] [name] date of birth 1951/01/14. These are obtained by reviewing 151 pages of medical records which were obtained from the [facility] 65 year old patient who had a history of stage I right breast cancer status post modified radical mastectomy on 06/12/1996. 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 She received 4 cycles of AC at [facility]. In January 2014 she presented with left breast swelling and apparently cellulitis. Breast biopsy done revealed lobular carcinoma which was ER and PR positive and HER-2/neu negative. On PET scan and she was shown to have multiple areas of disease left breast, axilla and supraclavicular areas. She received 6 cycles of chemotherapy and then received Femara because she was not a surgical candidate. PET scan also revealed disease in the left lateral breast and new cervical lymph nodes at level II. The patient was then sent for radiation therapy and received this over a month. In January 2016 she had a PET CT scan done which was compatible with marked progression of disease [name] [name], [name] ([id number]) with multifocal soft tissue masses in the chest wall left greater than right. She is currently receiving Xeloda and is noted to have disease in the chest wall which is widely present. Her most recent lab work from 07/02/2014 shows that her CMP is within normal limits with a serum albumin of 4.4. The previous CBC was within normal limits with a WBC count of 4.3 hemoglobin of 14 hematocrit of 40.9 and platelets of 202. After reading the reports as above I believe that this patient has extensive stage IV breast carcinoma which has now infiltrated into the skin of the chest wall and has fungating tumors coming through in several places especially on the  left. We will continue with palliative care and make her comfortable as much as possible as far as her wound care goes. Present time there is no odor but if this does occur we would use carbon- based products to mask the odor. Notes were also reviewed from her surgeon Dr. Hervey Ard --closure on 05/27/2014 for breast biopsy in view of the fact that she had extensive disease on her chest wall. Biopsies are taken from the nodular metastatic disease overlying the sternum and nodules chosen for biopsy. Punch biopsies were taken and the pathology on this confirmed that this was consistent with carcinoma 08/15/2014 -- she is started on a new course of injectable chemotherapy and this is 2 weeks on and 2 weeks off. She has no new symptoms no bleeding from the wounds nor is there any odor. She is doing fine otherwise. 09/16/2014. She is doing very well and the right side wounds have completely healed. She has finished 2 cycles of chemotherapy and she is 2 weeks on and 2 weeks off. He is to restart her chemotherapy this week. No fresh complaints pain is within control and there is no odor from the wounds. 10/21/2014 -- overall she is doing very well and is on a clinical trial with her medical oncologist. No issues with the wound on her left side and there is no odor or drainage. 11/25/2014 -- she continues to be under clinical trial protocol with the medical oncologist and has no fresh issues. 02/17/2015 -- she has been taken off the clinical trial protocol and now is on Taxol on a regular basis with her  medical oncologists Dr. Susy Manor. 05/20/15; the patient follow see her for a nonhealing area on the left lateral chest. 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 She has been using Aquacel Ag on this area on a palliative basis. No debridement has been done. 06/17/15; the patient follows in this clinic monthly for a nonhealing area on her  left lateral chest. She has known metastatic breast cancer which is metastatic to the chest wall. She returns today with a cluster of open areas on the lateral aspect of her chest. The nurses in the clinic described this as a large open area that progressively closed over. I've taken this opportunity to review her oncology status. On her arrival here she asked me to debridement the wounds on her chest stating that this was a request of [name] her medical oncologist. This is a patient who initially developed stage I right breast cancer treated with a modified radical mastectomy in 1998. She really presented with inflammatory breast cancer in 2014 post chemotherapy biopsy was positive she was not a surgical candidate. Since then she has been treated with multiple regimens. Most particular to her wounds PET scan in January 2016 revealed marked progression of the disease with multifocal masses in the chest wall left greater than right. 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 She has been receiving Faslodex. Weekly. She missed her last injection and is attributing this to the breakdown of tissue on her left chest wall. She was seen by [name] in early March however the record is not complete [name] [name], [name] ([id number]) Objective Constitutional Sitting or standing Blood Pressure is within target range for patient.. Pulse regular and within target range for patient.Marland Kitchen Respirations regular, non-labored and within target range.. Temperature is normal and within the target range for the patient.. Patient's appearance is neat and clean. Appears in no acute distress. Well nourished and well developed.. Vitals Time Taken: 9:51 AM, Height: 68 in, Weight:  180.5 lbs, BMI: 27.4, Temperature: 98.3 F, Pulse: 96 bpm, Respiratory Rate: 16 breaths/min, Blood Pressure: 107/93 mmHg. Lymphatic There was no lymphadenopathy in the left axilla, or clavicular areas.. General Notes: Wound exam; the original dime-sized area for which we have been following her is still open but actually appears to be smaller. This is further along on her left chest wall. She now has a cluster of new wounds I think surrounding a protruding cauliflower-like tumor. There is also a palpable tumor in the left breast area which is firm but nontender. Integumentary (Hair, Skin) Wound #1 status is Open. Original cause of wound was Other Lesion. The wound is located on the Left Chest. The wound measures 4cm length x 4cm width x 0.1cm depth; 12.566cm^2 area and 1.257cm^3 volume. The wound is limited to skin breakdown. There is no tunneling or undermining noted. There is a medium amount of serosanguineous drainage noted. The wound margin is distinct with the outline attached to the wound base. There is large (67-100%) red granulation within the wound bed. There is a small (1-33%) amount of necrotic tissue within the wound bed including Adherent Slough. The periwound skin appearance had no abnormalities noted for moisture. The periwound skin appearance had no abnormalities noted for color. The periwound skin appearance exhibited: Induration,  Scarring. The periwound skin appearance did not exhibit: Callus, Crepitus, Excoriation, Fluctuance, Friable, Localized Edema, Rash. Periwound temperature was noted as No Abnormality. Assessment Active Problems ICD-10 C50.812 - Malignant neoplasm of overlapping sites of left female breast S21.001A - Unspecified open wound of right breast, initial encounter C50.412 - Malignant neoplasm of upper-outer quadrant of left female breast S21.002A - Unspecified open wound of left breast, initial encounter [name] [name], [name] [name]. ([id number]) Procedures Wound  #1 Wound #1 is a Malignant Wound located on the Left Chest . There was a Skin/Subcutaneous Tissue Debridement ([id number]) debridement with total area of 16 sq cm performed by Ricard Dillon, MD. with the following instrument(s): Curette to remove Non-Viable tissue/material including Exudate, Fibrin/Slough, and Subcutaneous after achieving pain control using Lidocaine 4% Topical Solution. A time out was conducted prior to the start of the procedure. A Moderate amount of bleeding was controlled with Silver Nitrate. The procedure was tolerated well with a pain level of 0 throughout and a pain level of 0 following the procedure. Post Debridement Measurements: 4cm length x 4cm width x 0.1cm depth; 1.257cm^3 volume. Post procedure Diagnosis Wound #1: Same as Pre-Procedure Plan Wound Cleansing: Wound #1 Left Chest: Clean wound with Normal Saline. Primary Wound Dressing: Wound #1 Left Chest: Aquacel Ag Secondary Dressing: Wound #1 Left Chest: Boardered Foam Dressing Dressing Change Frequency: Wound #1 Left Chest: Change dressing every other day. - or more if needed Follow-up Appointments: Wound #1 Left Chest: Return Appointment in 1 month #1 at the request of the patient decided to go ahead and debridement some surface slough off most of the cluster of new wounds. However in retrospect I wonder really whether that was indicated. This is likely all metastatic tumor. The treatment of this should be purely palliative from a wound care point of view. #2 continue the Aquacel Ag based dressings that we have been following here with that. I'll review Dr. Kem Parkinson last note on the patient when that is available. 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 In spite of the history that there was quite a bit of healing here I think this is likely all to break down. I hope the patient is prepared for this. Electronic Signature(s) Signed: 06/18/2015 4:53:46 PM By:  Linton Ham MD Entered By: Linton Ham on 06/17/2015 12:22:48 [name] [name] ([id number]) -------------------------------------------------------------------------------- SuperBill Details Patient Name: [name] [name] Date of Service: 06/17/2015 Medical Record Patient Account Number: [id number] Number: Treating RN: Baruch Gouty RN, BSN, Rita 1950/07/15 [phone] y.o. Other Clinician: Date of Birth/Sex: Female) Treating Trenity Pha Primary Care Physician/Extender: G PATIENT, NO Physician: Weeks in Treatment: 48 Referring Physician: Diagnosis Coding ICD-10 Codes Code Description C50.812 Malignant neoplasm of overlapping sites of left female breast S21.001A Unspecified open wound of right breast, initial encounter C50.412 Malignant neoplasm of upper-outer quadrant of left female breast S21.002A Unspecified open wound of left breast, initial encounter Facility Procedures CPT4 Code: 02111735 Description: 67014 - DEB SUBQ TISSUE 20 SQ CM/< ICD-10 Description Diagnosis S21.002A Unspecified open wound of left breast, initial en Modifier: counter Quantity: 1 Physician Procedures CPT4 Code: 1030131 Description: 43888 - WC PHYS SUBQ TISS 20 SQ CM ICD-10 Description Diagnosis S21.002A Unspecified open wound of left breast, initial en Modifier: counter Quantity: 1 Electronic Signature(s) Signed: 06/18/2015 4:53:46 PM By: Linton Ham MD Entered By: Linton Ham on 06/17/2015 12:23:16

## 2015-06-19 NOTE — Progress Notes (Signed)
BRIDGITTE, BASELICE ([id number]) Visit Report for 06/17/2015 Arrival Information Details Patient Name: [name] [name], [name] [name] Date of Service: 06/17/2015 10:00 AM Medical Record Patient Account Number: [id number] Number: Treating RN: Baruch Gouty, RN, BSN, Rita 10-Apr-1951 [phone] y.o. Other Clinician: Date of Birth/Sex: Female) Treating ROBSON, MICHAEL Primary Care Physician/Extender: G PATIENT, NO Physician: Referring Physician: Weeks in Treatment: 51 Visit Information History Since Last Visit Added or deleted any medications: No Patient Arrived: Ambulatory Any new allergies or adverse reactions: No Arrival Time: 09:47 Had a fall or experienced change in No Accompanied By: self activities of daily living that may affect Transfer Assistance: None risk of falls: Patient Identification Verified: Yes Signs or symptoms of abuse/neglect since last No Secondary Verification Process Yes visito Completed: Hospitalized since last visit: No Patient Requires Transmission-Based No Has Dressing in Place as Prescribed: Yes Precautions: Pain Present Now: No Patient Has Alerts: No Electronic Signature(s) Signed: 06/17/2015 4:45:50 PM By: Regan Lemming BSN, RN Entered By: Regan Lemming on 06/17/2015 09:49:26 [name] [name] ([id number]) -------------------------------------------------------------------------------- Encounter Discharge Information Details Patient Name: [name] [name] Date of Service: 06/17/2015 10:00 AM Medical Record Patient Account Number: [id number] Number: Treating RN: Baruch Gouty, RN, BSN, Rita 07/09/1950 [phone] y.o. Other Clinician: Date of Birth/Sex: Female) Treating ROBSON, MICHAEL Primary Care Physician/Extender: G PATIENT, NO Physician: Referring Physician: Weeks in Treatment: 68 Encounter Discharge Information Items Discharge Pain Level: 0 Discharge Condition: Stable Ambulatory Status: Ambulatory Discharge Destination: Home Transportation: Private  Auto Accompanied By: self Schedule Follow-up Appointment: No Medication Reconciliation completed No and provided to Patient/Care Kaityln Kallstrom: Patient Clinical Summary of Care: Declined Electronic Signature(s) Signed: 06/17/2015 10:09:19 AM By: Ruthine Dose Entered By: Ruthine Dose on 06/17/2015 10:09:19 [name] [name] ([id number]) -------------------------------------------------------------------------------- Multi Wound Chart Details Patient Name: [name] [name] Date of Service: 06/17/2015 10:00 AM Medical Record Patient Account Number: [id number] Number: Treating RN: Baruch Gouty, RN, BSN, Rita 06-12-1950 [phone] y.o. Other Clinician: Date of Birth/Sex: Female) Treating ROBSON, MICHAEL Primary Care Physician/Extender: G PATIENT, NO Physician: Referring Physician: Weeks in Treatment: 48 Vital Signs Height(in): 68 Pulse(bpm): 96 Weight(lbs): 180.5 Blood Pressure 107/93 (mmHg): Body Mass Index(BMI): 27 Temperature(F): 98.3 Respiratory Rate 16 (breaths/min): Photos: [1:No Photos] [N/A:N/A] Wound Location: [1:Left Chest] [N/A:N/A] Wounding Event: [1:Other Lesion] [N/A:N/A] Primary Etiology: [1:Malignant Wound] [N/A:N/A] Comorbid History: [1:Received Chemotherapy, Received Radiation] [N/A:N/A] Date Acquired: [1:05/10/2014] [N/A:N/A] Weeks of Treatment: [1:48] [N/A:N/A] Wound Status: [1:Open] [N/A:N/A] Measurements L x W x D 4x4x0.1 [N/A:N/A] (cm) Area (cm) : [1:12.566] [N/A:N/A] Volume (cm) : [1:1.257] [N/A:N/A] % Reduction in Area: [1:46.70%] [N/A:N/A] % Reduction in Volume: 73.30% [N/A:N/A] Classification: [1:Full Thickness Without Exposed Support Structures] [N/A:N/A] Exudate Amount: [1:Medium] [N/A:N/A] Exudate Type: [1:Serosanguineous] [N/A:N/A] Exudate Color: [1:red, brown] [N/A:N/A] Wound Margin: [1:Distinct, outline attached] [N/A:N/A] Granulation Amount: [1:Large (67-100%)] [N/A:N/A] Granulation Quality: [1:Red] [N/A:N/A] Necrotic Amount: [1:Small  (1-33%)] [N/A:N/A] Exposed Structures: [1:Fascia: No Fat: No] [N/A:N/A] Tendon: No Muscle: No Joint: No Bone: No Limited to Skin Breakdown Epithelialization: None N/A N/A Periwound Skin Texture: Induration: Yes N/A N/A Scarring: Yes Edema: No Excoriation: No Callus: No Crepitus: No Fluctuance: No Friable: No Rash: No Periwound Skin No Abnormalities Noted N/A N/A Moisture: Periwound Skin Color: No Abnormalities Noted N/A N/A Temperature: No Abnormality N/A N/A Tenderness on No N/A N/A Palpation: Wound Preparation: Ulcer Cleansing: N/A N/A Rinsed/Irrigated with Saline Topical Anesthetic Applied: Other: lidocaine 4% Treatment Notes Electronic Signature(s) Signed: 06/17/2015 9:54:36 AM By: Regan Lemming BSN, RN Entered By: Regan Lemming on 06/17/2015 09:54:36 [name] [name] ([id number]) -------------------------------------------------------------------------------- [place] Details Patient  Name: [name] [name], [name] [name] Date of Service: 06/17/2015 10:00 AM Medical Record Patient Account Number: [id number] Number: Treating RN: Baruch Gouty, RN, BSN, Rita May 13, 1950 [phone] y.o. Other Clinician: Date of Birth/Sex: Female) Treating ROBSON, MICHAEL Primary Care Physician/Extender: G PATIENT, NO Physician: Referring Physician: Weeks in Treatment: 67 Active Inactive Abuse / Safety / Falls / Self Care Management Nursing Diagnoses: Potential for falls Goals: Patient will remain injury free Date Initiated: 07/11/2014 Goal Status: Active Interventions: Assess fall risk on admission and as needed Notes: Malignancy/Atypical Etiology Nursing Diagnoses: Knowledge deficit related to disease process and management of atypical ulcer etiology Goals: Patient/caregiver will verbalize understanding of disease process and disease management of atypical ulcer etiology Date Initiated: 07/11/2014 Goal Status: Active Interventions: Provide education on atypical ulcer  etiologies Notes: Nutrition Nursing Diagnoses: Potential for alteratiion in Nutrition/Potential for imbalanced nutrition [name] [name], [name] [name]. ([id number]) Goals: Patient/caregiver agrees to and verbalizes understanding of need to use nutritional supplements and/or vitamins as prescribed Date Initiated: 07/11/2014 Goal Status: Active Interventions: Assess patient nutrition upon admission and as needed per policy Notes: Orientation to the Wound Care Program Nursing Diagnoses: Knowledge deficit related to the wound healing center program Goals: Patient/caregiver will verbalize understanding of the [place] Date Initiated: 07/11/2014 Goal Status: Active Interventions: Provide education on orientation to the wound center Notes: Wound/Skin Impairment Nursing Diagnoses: Impaired tissue integrity Goals: Patient will demonstrate a reduced rate of smoking or cessation of smoking Date Initiated: 07/11/2014 Goal Status: Active Patient will have a decrease in wound volume by X% from date: (specify in notes) Date Initiated: 07/11/2014 Goal Status: Active Patient/caregiver will verbalize understanding of skin care regimen Date Initiated: 07/11/2014 Goal Status: Active Ulcer/skin breakdown will have a volume reduction of 30% by week 4 Date Initiated: 07/11/2014 Goal Status: Active Ulcer/skin breakdown will have a volume reduction of 50% by week 8 Date Initiated: 07/11/2014 [name] [name] ([id number]) Goal Status: Active Ulcer/skin breakdown will have a volume reduction of 80% by week 12 Date Initiated: 07/11/2014 Goal Status: Active Ulcer/skin breakdown will heal within 14 weeks Date Initiated: 07/11/2014 Goal Status: Active Interventions: Assess patient/caregiver ability to obtain necessary supplies Assess patient/caregiver ability to perform ulcer/skin care regimen upon admission and as needed Assess ulceration(s) every visit Provide education on smoking Provide  education on ulcer and skin care Screen for HBO Notes: Electronic Signature(s) Signed: 06/17/2015 9:54:19 AM By: Regan Lemming BSN, RN Entered By: Regan Lemming on 06/17/2015 09:54:19 [name] [name] ([id number]) -------------------------------------------------------------------------------- Pain Assessment Details Patient Name: [name] [name] Date of Service: 06/17/2015 10:00 AM Medical Record Patient Account Number: [id number] Number: Treating RN: Baruch Gouty, RN, BSN, Rita Jan 04, 1951 [phone] y.o. Other Clinician: Date of Birth/Sex: Female) Treating ROBSON, MICHAEL Primary Care Physician/Extender: G PATIENT, NO Physician: Referring Physician: Weeks in Treatment: 32 Active Problems Location of Pain Severity and Description of Pain Patient Has Paino No Site Locations Pain Management and Medication Current Pain Management: Electronic Signature(s) Signed: 06/17/2015 4:45:50 PM By: Regan Lemming BSN, RN Entered By: Regan Lemming on 06/17/2015 09:49:48 [name] [name] ([id number]) -------------------------------------------------------------------------------- Patient/Caregiver Education Details Patient Name: [name] [name] Date of Service: 06/17/2015 10:00 AM Medical Record Patient Account Number: [id number] Number: Treating RN: Baruch Gouty, RN, BSN, Rita 1951/01/05 [phone] y.o. Other Clinician: Date of Birth/Gender: Female) Treating ROBSON, MICHAEL Primary Care Physician/Extender: G PATIENT, NO Physician: Weeks in Treatment: 11 Referring Physician: Education Assessment Education Provided To: Patient Education Topics Provided Malignant/Atypical Wounds: Methods: Explain/Verbal Responses: State content correctly Smoking and Wound Healing: Methods: Explain/Verbal Responses: State  content correctly Welcome To [facility]: Methods: Explain/Verbal Responses: State content correctly Wound/Skin Impairment: Methods: Explain/Verbal Responses: State content  correctly Electronic Signature(s) Signed: 06/17/2015 4:45:50 PM By: Regan Lemming BSN, RN Entered By: Regan Lemming on 06/17/2015 10:05:33 [name] [name] ([id number]) -------------------------------------------------------------------------------- Wound Assessment Details Patient Name: [name] [name] Date of Service: 06/17/2015 10:00 AM Medical Record Patient Account Number: [id number] Number: Treating RN: Baruch Gouty, RN, BSN, Rita 10/16/1950 [phone] y.o. Other Clinician: Date of Birth/Sex: Female) Treating ROBSON, MICHAEL Primary Care Physician/Extender: G PATIENT, NO Physician: Referring Physician: Weeks in Treatment: 48 Wound Status Wound Number: 1 Primary Malignant Wound Etiology: Wound Location: Left Chest Wound Status: Open Wounding Event: Other Lesion Comorbid Received Chemotherapy, Received Date Acquired: 05/10/2014 History: Radiation Weeks Of Treatment: 48 Clustered Wound: No Photos Photo Uploaded By: Regan Lemming on 06/17/2015 13:05:39 Wound Measurements Length: (cm) 4 Width: (cm) 4 Depth: (cm) 0.1 Area: (cm) 12.566 Volume: (cm) 1.257 % Reduction in Area: 46.7% % Reduction in Volume: 73.3% Epithelialization: None Tunneling: No Undermining: No Wound Description Full Thickness Without Exposed Classification: Support Structures Wound Margin: Distinct, outline attached Exudate Medium Amount: Exudate Type: Serosanguineous Exudate Color: red, brown Foul Odor After Cleansing: No Wound Bed [name] [name], [name] Granulation Amount: Large (67-100%) Exposed Structure Granulation Quality: Red Fascia Exposed: No Necrotic Amount: Small (1-33%) Fat Layer Exposed: No Necrotic Quality: Adherent Slough Tendon Exposed: No Muscle Exposed: No Joint Exposed: No Bone Exposed: No Limited to Skin Breakdown Periwound Skin Texture Texture Color No Abnormalities Noted: No No Abnormalities Noted: Yes Callus: No Temperature / Pain Crepitus:  No Temperature: No Abnormality Excoriation: No Fluctuance: No Friable: No Induration: Yes Localized Edema: No Rash: No Scarring: Yes Moisture No Abnormalities Noted: Yes Wound Preparation Ulcer Cleansing: Rinsed/Irrigated with Saline Topical Anesthetic Applied: Other: lidocaine 4%, Treatment Notes Wound #1 (Left Chest) 1. Cleansed with: Clean wound with Normal Saline 4. Dressing Applied: Aquacel Ag 5. Secondary Dressing Applied Bordered Foam Dressing Electronic Signature(s) Signed: 06/17/2015 9:53:38 AM By: Regan Lemming BSN, RN Entered By: Regan Lemming on 06/17/2015 09:53:37 [name] [name] ([id number]) -------------------------------------------------------------------------------- Vitals Details Patient Name: [name] [name] Date of Service: 06/17/2015 10:00 AM Medical Record Patient Account Number: [id number] Number: Treating RN: Baruch Gouty, RN, BSN, Rita [birth year] [phone] y.o. Other Clinician: Date of Birth/Sex: Female) Treating ROBSON, MICHAEL Primary Care Physician/Extender: G PATIENT, NO Physician: Referring Physician: Weeks in Treatment: 48 Vital Signs Time Taken: 09:51 Temperature (F): 98.3 Height (in): 68 Pulse (bpm): 96 Weight (lbs): 180.5 Respiratory Rate (breaths/min): 16 Body Mass Index (BMI): 27.4 Blood Pressure (mmHg): 107/93 Reference Range: 80 - 120 mg / dl Electronic Signature(s) Signed: 06/17/2015 4:45:50 PM By: Regan Lemming BSN, RN Entered By: Regan Lemming on 06/17/2015 09:52:01

## 2015-06-22 ENCOUNTER — Encounter: Payer: Self-pay | Admitting: Hematology and Oncology

## 2015-07-03 ENCOUNTER — Inpatient Hospital Stay: Payer: Medicare Other

## 2015-07-03 DIAGNOSIS — Z95828 Presence of other vascular implants and grafts: Secondary | ICD-10-CM

## 2015-07-03 DIAGNOSIS — C50412 Malignant neoplasm of upper-outer quadrant of left female breast: Secondary | ICD-10-CM | POA: Diagnosis not present

## 2015-07-03 MED ORDER — HEPARIN SOD (PORK) LOCK FLUSH 100 UNIT/ML IV SOLN
500.0000 [IU] | Freq: Once | INTRAVENOUS | Status: AC
  Administered 2015-07-03: 500 [IU] via INTRAVENOUS

## 2015-07-03 MED ORDER — SODIUM CHLORIDE 0.9% FLUSH
10.0000 mL | INTRAVENOUS | Status: DC | PRN
Start: 2015-07-03 — End: 2015-07-03
  Administered 2015-07-03: 10 mL via INTRAVENOUS
  Filled 2015-07-03: qty 10

## 2015-07-04 ENCOUNTER — Telehealth: Payer: Self-pay

## 2015-07-04 NOTE — Telephone Encounter (Signed)
Returned pt's phone call for me to call her.  No answer left VM for pt to call me back if she needs me.

## 2015-07-07 ENCOUNTER — Inpatient Hospital Stay (HOSPITAL_BASED_OUTPATIENT_CLINIC_OR_DEPARTMENT_OTHER): Payer: Medicare Other | Admitting: Hematology and Oncology

## 2015-07-07 VITALS — BP 143/81 | HR 89 | Temp 98.5°F | Resp 20 | Wt 168.0 lb

## 2015-07-07 DIAGNOSIS — C792 Secondary malignant neoplasm of skin: Principal | ICD-10-CM

## 2015-07-07 DIAGNOSIS — C50412 Malignant neoplasm of upper-outer quadrant of left female breast: Secondary | ICD-10-CM

## 2015-07-07 DIAGNOSIS — Z9221 Personal history of antineoplastic chemotherapy: Secondary | ICD-10-CM

## 2015-07-07 DIAGNOSIS — Z9011 Acquired absence of right breast and nipple: Secondary | ICD-10-CM

## 2015-07-07 DIAGNOSIS — Z79899 Other long term (current) drug therapy: Secondary | ICD-10-CM

## 2015-07-07 DIAGNOSIS — Z170 Estrogen receptor positive status [ER+]: Secondary | ICD-10-CM

## 2015-07-07 DIAGNOSIS — R918 Other nonspecific abnormal finding of lung field: Secondary | ICD-10-CM | POA: Diagnosis not present

## 2015-07-07 DIAGNOSIS — Z79818 Long term (current) use of other agents affecting estrogen receptors and estrogen levels: Secondary | ICD-10-CM

## 2015-07-07 DIAGNOSIS — F17210 Nicotine dependence, cigarettes, uncomplicated: Secondary | ICD-10-CM | POA: Diagnosis not present

## 2015-07-07 DIAGNOSIS — Z853 Personal history of malignant neoplasm of breast: Secondary | ICD-10-CM

## 2015-07-07 DIAGNOSIS — C50912 Malignant neoplasm of unspecified site of left female breast: Secondary | ICD-10-CM

## 2015-07-07 NOTE — Progress Notes (Signed)
Patient here today for ongoing follow up regarding breast wound. Patient states wound is draining and she feels it is flared up right now.

## 2015-07-07 NOTE — Progress Notes (Signed)
[facility] day:  07/07/2015   Chief Complaint: [name] [name] is a 65 y.o. female with recurrent breast cancer who is seen for reassessment on Faslodex secondary to chest wall changes.  HPI: The patient was last seen in the medical oncology clinic on 06/12/2015.  At that time, she was doing well. She had persistent chest wall nodularity.  She received Faslodex.  CA27.29 was 258.6.  During the interim,  She notes a little fatigue.  She has had increasing chest wall changes.  She has had debridement of several anterior chest wall lesions by wound care.  She notes drainage.  She takes a pain pill when she needs it (minimal use).   Past Medical History  Diagnosis Date  . Personal history of malignant neoplasm of breast   . Anemia   . Blood transfusion without reported diagnosis   . Emphysema of lung ([place])   . Breast cancer [facility])     Past Surgical History  Procedure Laterality Date  . Oophrectomy  1980  . Abdominal hysterectomy  1995  . Breast surgery Right 1996    mastectomy  . Breast biopsy Left 2014  . Breast biopsy Left 01-21-14    Family History  Problem Relation Age of Onset  . Cancer Father   . Heart disease Sister   . Diabetes Sister   . Heart disease Brother   . Cancer Paternal Grandmother     breast    Social History:  reports that she has been smoking Cigarettes.  She has a 6.25 pack-year smoking history. She has never used smokeless tobacco. She reports that she does not drink alcohol or use illicit drugs.  She is alone today.  Allergies:  Allergies  Allergen Reactions  . Sulfa Antibiotics Nausea And Vomiting    headache    Current Medications: Current Outpatient Prescriptions  Medication Sig Dispense Refill  . Calcium Carbonate-Vitamin D (CALCIUM + D PO) Take 2 tablets by mouth daily.    Marland Kitchen KLOR-CON 10 10 MEQ tablet TAKE 1 TABLET (10 MEQ TOTAL) BY MOUTH DAILY. 30 tablet 1  . LORazepam (ATIVAN) 0.5 MG tablet    0  . traMADol (ULTRAM) 50 MG tablet Take 1 tablet (50 mg total) by mouth every 8 (eight) hours as needed. 90 tablet 0   No current facility-administered medications for this visit.   Facility-Administered Medications Ordered in Other Visits  Medication Dose Route Frequency Provider Last Rate Last Dose  . sodium chloride 0.9 % injection 10 mL  10 mL Intravenous PRN Lequita Asal, MD   10 mL at 10/08/14 1335  . sodium chloride 0.9 % injection 10 mL  10 mL Intracatheter PRN Lequita Asal, MD   10 mL at 11/26/14 1355    Review of Systems:  GENERAL:  Feels "ok" (little fatigue).  No fevers or sweats.  Weight down 2 pounds. PERFORMANCE STATUS (ECOG):  1 HEENT:  No visual changes, runny nose, sore throat, mouth sores or tenderness. Lungs: No shortness of breath.  No cough.  No hemoptysis. Cardiac:  No chest pain, palpitations, orthopnea, or PND. GI:  No nausea, vomiting, diarrhea, constipation, melena or hematochezia. GU:  No urgency, frequency, dysuria, or hematuria. Musculoskeletal:  No back pain.  No joint pain.  No muscle tenderness. Extremities:  No pain or swelling. Skin:  New chest wall lesions, debrided.  Hair thin.  No rashes or skin changes. 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  No  headache, numbness or weakness, or coordination issues. Endocrine:  No diabetes, thyroid issues, hot flashes or night sweats. Psych:  No mood changes, depression or anxiety. Pain: Little discomfort on chest wall (controlled with Tramadol). Review of systems:  All other systems reviewed and found to be negative.   Physical Exam: Blood pressure 143/81, pulse 89, temperature 98.5 F (36.9 C), temperature source Tympanic, resp. rate 20, weight 167 lb 15.9 oz (76.2 kg).  GENERAL: Well developed, well nourished, sitting comfortably in the exam room in no acute distress. MENTAL STATUS: Alert and oriented to person, place and time. HEAD: Short gray wig. Normocephalic, atraumatic, face  symmetric, no Cushingoid features. EYES: Brown eyes. Pupils equal round and reactive to light and accomodation.  No conjunctivitis or scleral icterus. ENT: Oropharynx clear without lesion. Tongue normal. Mucous membranes moist.  NECK:  Left neck cyst (stable). BREAST: Right chest wall lesions healed. Central upper sternal area flat.  Left 1.5 cm axillary nodule (stable).  Chest wall thickened.  Confluent 1.5-2 cm lesions over anterior chest s/p debridement.  Nodules (1.5 cm and 2 cm) in skin below left clavicle. Left lateral denuded triangular area (1.5 x 1 cm) plus similar sized hypopigmented area. SKIN: Cutaneous breast cancer noted in breast section. No other lesions.  EXTREMITIES: No edema, no skin discoloration or tenderness. No palpable cords. LYMPH NODES: Left axillary ridge. No palpable cervical, supraclavicular, axillary or inguinal adenopathy  NEUROLOGICAL: Unremarkable.  PSYCH: Appropriate.  No visits with results within 3 Day(s) from this visit. Latest known visit with results is:  Appointment on 06/12/2015  Component Date Value Ref Range Status  . WBC 06/12/2015 4.7  3.6 - 11.0 K/uL Final  . RBC 06/12/2015 4.61  3.80 - 5.20 MIL/uL Final  . Hemoglobin 06/12/2015 13.3  12.0 - 16.0 g/dL Final  . HCT 06/12/2015 39.6  35.0 - 47.0 % Final  . MCV 06/12/2015 85.8  80.0 - 100.0 fL Final  . MCH 06/12/2015 28.8  26.0 - 34.0 pg Final  . MCHC 06/12/2015 33.6  32.0 - 36.0 g/dL Final  . RDW 06/12/2015 16.0* 11.5 - 14.5 % Final  . Platelets 06/12/2015 176  150 - 440 K/uL Final  . Neutrophils Relative % 06/12/2015 49   Final  . Neutro Abs 06/12/2015 2.3  1.4 - 6.5 K/uL Final  . Lymphocytes Relative 06/12/2015 37   Final  . Lymphs Abs 06/12/2015 1.7  1.0 - 3.6 K/uL Final  . Monocytes Relative 06/12/2015 11   Final  . Monocytes Absolute 06/12/2015 0.5  0.2 - 0.9 K/uL Final  . Eosinophils Relative 06/12/2015 2   Final  . Eosinophils Absolute 06/12/2015 0.1  0 - 0.7 K/uL Final  .  Basophils Relative 06/12/2015 1   Final  . Basophils Absolute 06/12/2015 0.1  0 - 0.1 K/uL Final  . Sodium 06/12/2015 135  135 - 145 mmol/L Final  . Potassium 06/12/2015 3.6  3.5 - 5.1 mmol/L Final  . Chloride 06/12/2015 103  101 - 111 mmol/L Final  . CO2 06/12/2015 26  22 - 32 mmol/L Final  . Glucose, Bld 06/12/2015 99  65 - 99 mg/dL Final  . BUN 06/12/2015 17  6 - 20 mg/dL Final  . Creatinine, Ser 06/12/2015 0.99  0.44 - 1.00 mg/dL Final  . Calcium 06/12/2015 8.7* 8.9 - 10.3 mg/dL Final  . Total Protein 06/12/2015 7.7  6.5 - 8.1 g/dL Final  . Albumin 06/12/2015 3.9  3.5 - 5.0 g/dL Final  . AST 06/12/2015 25  15 -  41 U/L Final  . ALT 06/12/2015 22  14 - 54 U/L Final  . Alkaline Phosphatase 06/12/2015 105  38 - 126 U/L Final  . Total Bilirubin 06/12/2015 0.3  0.3 - 1.2 mg/dL Final  . GFR calc non Af Amer 06/12/2015 60* >60 mL/min Final  . GFR calc Af Amer 06/12/2015 >60  >60 mL/min Final   Comment: (NOTE) The eGFR has been calculated using the CKD EPI equation. This calculation has not been validated in all clinical situations. eGFR's persistently <60 mL/min signify possible Chronic Kidney Disease.   . Anion gap 06/12/2015 6  5 - 15 Final  . CA 27.29 06/12/2015 258.6* 0.0 - 38.6 U/mL Final   Comment: (NOTE) Bayer Centaur/ACS methodology Performed At: [facility] [address] [place], [place] [id number] Lindon Romp MD JG:[id number]     Assessment:  KINZEY SHERIFF is a 65 y.o. African American woman with a history of stage I right breast cancer s/p modified radical mastectomy on 06/12/1996. Pathology revealed a grade III mutifocal infiltrating ductal carcinoma (tumor size 8.5 cm with an invasive component 1.6 cm). Twenty-one lymph nodes were negative. Tumor was ER/PR negative. She received 4 cycles of AC at [facility].  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 Breast biopsy on 05/11/2012 revealed lobular carcinoma (ER/PR + and Her/2neu  negative). PET scan on 05/22/2012 revealed multiple areas of disease. She received 6 cycles of Taxotere and Cytoxan (TC) from 06/06/2012 - 09/29/2012. PET scan on 09/07/2012 noted marked improvement. Post chemotherapy biopsy was positive. She was not a surgical candidate. She was placed on Femara.  PET scan on 05/03/2013 revealed a new focus along lateral left breast and a new level II cervical lymph node. She received radiation from 09/13/2013 - 11/05/2013. PET scan on 01/01/2014 revealed new nodule medial left chest wall pleural/paraspinal activity LUL. Multiple biopsies on 01/19/2014 were positive. She began daily letrozole and Ibrance on 02/12/2014. Despite treatment, new nodules formed. She received a total of 2 cycles.   PET scan on 05/09/2014 revealed marked progression of disease with multifocal masses in chest wall (left > right). She did not receive Imbrance in 04/2014. CA27.29 has increased: 22.9 on 01/08/2014, 54 on 03/05/2014, 170.8 on 05/06/2014, 250.8 on 05/23/2014, 445.8 on 06/14/2014, and 531.1 on 07/02/2014.   She received 3 cycles of Xeloda (02/12 - 07/05/2014). Chest and abdomen CT scan on 07/05/2014 revealed persistent and slightly progressive diffuse locally invasive chest wall tumor. Bone scan on 07/25/2014 revealed no evidence of metastatic disease.   She received 8 cycles of Halaven (08/06/2014 - 01/07/2015) on ACCRU [id number] I. 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  Chest, abdomen, and pelvic CT scan on 10/24/2014 revealed improved multifocal inflammatory left breast cancer.  The dominant 2.6 x 3.5 cm lesion in the left upper outer left breast had decreased to 1.7 x 3.5 cm. The anterior sternal lesion has decreased from 2 x 4 cm to 1.5 x 3.9 cm. There were no suspicious mediastinal, hilar or axillary adenopathy.    Chest, abdomen, and pelvic CT scan on 01/20/2015 revealed inflammatory left breast cancer with new necrotic appearing subcutaneous  nodules in the lateral left breast and lateral left chest wall. CA27.29 was 339.8 on 08/06/2014, 51.7 on 10/15/2014, 37.6 on 12/10/2014, 26.4 on 01/07/2015, 34.1 on 01/28/2015, and 49.3 on 03/10/2015, and 258.6 on 06/12/2015.   She received 2 cycles of Taxol given 3 weeks on/1 week off (02/10/2015 - 03/10/2015).  Chest and abdomen CT scan on  04/03/2015 revealed interval growth of superficial tumors in the lateral left breast and lateral left chest wall.  In the left lateral chest, disease had increased from 2.3 x 2 to 2.9 x 2.2 cm.  In the superficial lateral left chest wall, disease had increased from 1.7 x 1.2 cm to 2.7 x 1.8 cm.  There were stable findings of locally infiltrative tumor in the left chest wall and left axilla, including extensive left chest wall skin thickening, infiltrative soft tissue encasing the left axillary artery and focal pleural thickening in the anterior left mid pleural space.  There was no metastatic disease in the abdomen.  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  Estrogen receptor was > 90%, progesterone receptor was > 90%, and Her2/neu was 1+.    Foundation One testing on 05/09/2015 noted genomic alterations of PIK3CA (M0375O), PTEN (splice site 360-6V>P), ESR1 (Y537C), CDH1 (A634V), CDKN1B (K73f*47), MLL2 (R1903*-subclonal), and MSH6 (F1082f5).  Treatment options of Faslodex (ESR1) and everolimus (approved) and temsirolimus (approved therapy for other tumor types).  She began Faslodex on 05/08/2015 (last 06/12/2015).  Symptomatically, she has increasing chest wall nodularity which was recently debrided.  Pain is well controlled.  Plan: 1.  Discuss concern for progressive disease.  Discuss restaging studies.  Discuss Foundation One options.   2.  Schedule chest, abdomen, and pelvic CT scan. 3.  RTC on 07/10/2015 as previously scheduled.   MeLequita AsalMD  07/07/2015

## 2015-07-09 ENCOUNTER — Ambulatory Visit: Admission: RE | Admit: 2015-07-09 | Payer: Medicare Other | Source: Ambulatory Visit

## 2015-07-09 ENCOUNTER — Ambulatory Visit
Admission: RE | Admit: 2015-07-09 | Discharge: 2015-07-09 | Disposition: A | Discharge status: Home / Self Care | Payer: Medicare Other | Source: Ambulatory Visit | Attending: Hematology and Oncology | Admitting: Hematology and Oncology

## 2015-07-09 DIAGNOSIS — C792 Secondary malignant neoplasm of skin: Secondary | ICD-10-CM | POA: Diagnosis present

## 2015-07-09 DIAGNOSIS — R918 Other nonspecific abnormal finding of lung field: Secondary | ICD-10-CM | POA: Insufficient documentation

## 2015-07-09 DIAGNOSIS — C50912 Malignant neoplasm of unspecified site of left female breast: Secondary | ICD-10-CM | POA: Insufficient documentation

## 2015-07-09 HISTORY — DX: Reserved for inherently not codable concepts without codable children: IMO0001

## 2015-07-09 MED ORDER — IOPAMIDOL (ISOVUE-300) INJECTION 61%
100.0000 mL | Freq: Once | INTRAVENOUS | Status: AC | PRN
  Administered 2015-07-09: 100 mL via INTRAVENOUS

## 2015-07-10 ENCOUNTER — Inpatient Hospital Stay: Payer: Medicare Other

## 2015-07-10 ENCOUNTER — Inpatient Hospital Stay (HOSPITAL_BASED_OUTPATIENT_CLINIC_OR_DEPARTMENT_OTHER): Payer: Medicare Other | Admitting: Hematology and Oncology

## 2015-07-10 VITALS — BP 133/83 | HR 83 | Temp 96.9°F | Resp 18 | Ht 67.0 in | Wt 164.2 lb

## 2015-07-10 DIAGNOSIS — C792 Secondary malignant neoplasm of skin: Secondary | ICD-10-CM

## 2015-07-10 DIAGNOSIS — C50912 Malignant neoplasm of unspecified site of left female breast: Secondary | ICD-10-CM

## 2015-07-10 DIAGNOSIS — R918 Other nonspecific abnormal finding of lung field: Secondary | ICD-10-CM | POA: Diagnosis not present

## 2015-07-10 DIAGNOSIS — Z9221 Personal history of antineoplastic chemotherapy: Secondary | ICD-10-CM

## 2015-07-10 DIAGNOSIS — Z9011 Acquired absence of right breast and nipple: Secondary | ICD-10-CM

## 2015-07-10 DIAGNOSIS — Z79818 Long term (current) use of other agents affecting estrogen receptors and estrogen levels: Secondary | ICD-10-CM

## 2015-07-10 DIAGNOSIS — G629 Polyneuropathy, unspecified: Secondary | ICD-10-CM

## 2015-07-10 DIAGNOSIS — F17210 Nicotine dependence, cigarettes, uncomplicated: Secondary | ICD-10-CM

## 2015-07-10 DIAGNOSIS — C50112 Malignant neoplasm of central portion of left female breast: Secondary | ICD-10-CM

## 2015-07-10 DIAGNOSIS — C50412 Malignant neoplasm of upper-outer quadrant of left female breast: Secondary | ICD-10-CM | POA: Diagnosis not present

## 2015-07-10 DIAGNOSIS — Z79899 Other long term (current) drug therapy: Secondary | ICD-10-CM

## 2015-07-10 DIAGNOSIS — Z853 Personal history of malignant neoplasm of breast: Secondary | ICD-10-CM

## 2015-07-10 DIAGNOSIS — Z170 Estrogen receptor positive status [ER+]: Secondary | ICD-10-CM | POA: Diagnosis not present

## 2015-07-10 LAB — COMPREHENSIVE METABOLIC PANEL
ALT: 16 U/L (ref 14–54)
AST: 25 U/L (ref 15–41)
Albumin: 4.3 g/dL (ref 3.5–5.0)
Alkaline Phosphatase: 100 U/L (ref 38–126)
Anion gap: 8 (ref 5–15)
BUN: 18 mg/dL (ref 6–20)
CO2: 27 mmol/L (ref 22–32)
Calcium: 9.3 mg/dL (ref 8.9–10.3)
Chloride: 104 mmol/L (ref 101–111)
Creatinine, Ser: 0.95 mg/dL (ref 0.44–1.00)
GFR calc Af Amer: >60 mL/min (ref >60)
GFR calc non Af Amer: >60 mL/min (ref >60)
Glucose, Bld: 90 mg/dL (ref 65–99)
Potassium: 4.3 mmol/L (ref 3.5–5.1)
Sodium: 139 mmol/L (ref 135–145)
Total Bilirubin: 0.4 mg/dL (ref 0.3–1.2)
Total Protein: 8.3 g/dL — ABNORMAL HIGH (ref 6.5–8.1)

## 2015-07-10 LAB — CBC WITH DIFFERENTIAL/PLATELET
Basophils Absolute: 0 10*3/uL (ref 0–0.1)
Basophils Relative: 1 %
Eosinophils Absolute: 0.1 10*3/uL (ref 0–0.7)
Eosinophils Relative: 2 %
HCT: 41.9 % (ref 35.0–47.0)
Hemoglobin: 14.2 g/dL (ref 12.0–16.0)
Lymphocytes Relative: 31 %
Lymphs Abs: 1.8 10*3/uL (ref 1.0–3.6)
MCH: 28.9 pg (ref 26.0–34.0)
MCHC: 34 g/dL (ref 32.0–36.0)
MCV: 85.1 fL (ref 80.0–100.0)
Monocytes Absolute: 0.9 10*3/uL (ref 0.2–0.9)
Monocytes Relative: 15 %
Neutro Abs: 3 10*3/uL (ref 1.4–6.5)
Neutrophils Relative %: 51 %
Platelets: 201 10*3/uL (ref 150–440)
RBC: 4.92 MIL/uL (ref 3.80–5.20)
RDW: 15.3 % — ABNORMAL HIGH (ref 11.5–14.5)
WBC: 5.7 10*3/uL (ref 3.6–11.0)

## 2015-07-10 NOTE — Progress Notes (Signed)
Pt reports having Diarreah yesterday X2 loose stool.  Pt reports at left breast site some pain and inflammation at site

## 2015-07-11 LAB — CANCER ANTIGEN 27.29: CA 27.29: 421.3 U/mL — ABNORMAL HIGH (ref 0.0–38.6)

## 2015-07-11 NOTE — Progress Notes (Signed)
[facility] day:  07/10/2015  Chief Complaint: [name] [name] is a 65 y.o. female with recurrent breast cancer who is seen for review of interval CT scans and discussion regarding direction of therapy.  HPI: The patient was last seen in the medical oncology clinic on 07/07/2015.  At that time, she was seen for an unscheduled visit secondary to progressive chest wall nodularity.   She has recent debridement by wound care.  CA27.29 had increased.  We discussed restaging studies.  Chest, abdomen, and pelvic CT scans on 07/09/2015 revealed interval progression of disease in the left anterior and lateral chest wall.  There was interval development of nodular pleural thickening in the posterior medial left hemi thorax with some focal fluid in the left major fissure. Findings raised concern for interval development of pleural involvement by tumor.  There was no evidence for metastatic disease in the abdomen   Symptomatically, she feels uncomfortable.  She states that she would feel better if she had some of the padding/dressing from wound care (limited supply).  She states that they need "new measurements".  Past Medical History  Diagnosis Date  . Personal history of malignant neoplasm of breast   . Anemia   . Blood transfusion without reported diagnosis   . Emphysema of lung ([place])   . History of left breast cancer , known active 2013    Past Surgical History  Procedure Laterality Date  . Oophrectomy  1980  . Abdominal hysterectomy  1995  . Breast surgery Right 1996    mastectomy  . Breast biopsy Left 2014  . Breast biopsy Left 01-21-14    Family History  Problem Relation Age of Onset  . Cancer Father   . Heart disease Sister   . Diabetes Sister   . Heart disease Brother   . Cancer Paternal Grandmother     breast    Social History:  reports that she has been smoking Cigarettes.  She has a 6.25 pack-year smoking history. She has never  used smokeless tobacco. She reports that she does not drink alcohol or use illicit drugs.  She is alone today.  Allergies:  Allergies  Allergen Reactions  . Sulfa Antibiotics Nausea And Vomiting    headache    Current Medications: Current Outpatient Prescriptions  Medication Sig Dispense Refill  . Calcium Carbonate-Vitamin D (CALCIUM + D PO) Take 2 tablets by mouth daily.    Marland Kitchen KLOR-CON 10 10 MEQ tablet TAKE 1 TABLET (10 MEQ TOTAL) BY MOUTH DAILY. 30 tablet 1  . LORazepam (ATIVAN) 0.5 MG tablet   0  . traMADol (ULTRAM) 50 MG tablet Take 1 tablet (50 mg total) by mouth every 8 (eight) hours as needed. 90 tablet 0   No current facility-administered medications for this visit.   Facility-Administered Medications Ordered in Other Visits  Medication Dose Route Frequency Provider Last Rate Last Dose  . sodium chloride 0.9 % injection 10 mL  10 mL Intravenous PRN Lequita Asal, MD   10 mL at 10/08/14 1335  . sodium chloride 0.9 % injection 10 mL  10 mL Intracatheter PRN Lequita Asal, MD   10 mL at 11/26/14 1355    Review of Systems:  GENERAL:  Feels "ok".  No fevers or sweats.  Weight up 2 pounds. PERFORMANCE STATUS (ECOG):  1 HEENT:  No visual changes, runny nose, sore throat, mouth sores or tenderness. Lungs: No shortness of breath.  No cough.  No hemoptysis.  Cardiac:  No chest pain, palpitations, orthopnea, or PND. GI:  Appetite 50%.  No nausea, vomiting, diarrhea, constipation, melena or hematochezia. GU:  No urgency, frequency, dysuria, or hematuria. Musculoskeletal:  No back pain.  No joint pain.  No muscle tenderness. Extremities:  Pain in big toe.  No swelling. Skin:  Hair thin.  Chest wall lesions growing (uncomfortable without padding).  No rashes or skin changes. 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  No headache, numbness or weakness, or coordination issues. Endocrine:  No diabetes, thyroid issues, hot flashes or night sweats. Psych:  No mood changes,  depression or anxiety. Pain: No complaints. Review of systems:  All other systems reviewed and found to be negative.   Physical Exam: Blood pressure 133/83, pulse 83, temperature 96.9 F (36.1 C), temperature source Tympanic, resp. rate 18, height 5' 7"  (1.702 m), weight 164 lb 3.9 oz (74.5 kg).  GENERAL: Well developed, well nourished, sitting comfortably in the exam room in no acute distress. MENTAL STATUS: Alert and oriented to person, place and time. HEAD: Short gray wig. Normocephalic, atraumatic, face symmetric, no Cushingoid features. EYES: Brown eyes. No conjunctivitis or scleral icterus. NECK:  Left neck cyst (stable). NEUROLOGICAL: Unremarkable.  PSYCH: Appropriate.  Appointment on 07/10/2015  Component Date Value Ref Range Status  . WBC 07/10/2015 5.7  3.6 - 11.0 K/uL Final  . RBC 07/10/2015 4.92  3.80 - 5.20 MIL/uL Final  . Hemoglobin 07/10/2015 14.2  12.0 - 16.0 g/dL Final  . HCT 07/10/2015 41.9  35.0 - 47.0 % Final  . MCV 07/10/2015 85.1  80.0 - 100.0 fL Final  . MCH 07/10/2015 28.9  26.0 - 34.0 pg Final  . MCHC 07/10/2015 34.0  32.0 - 36.0 g/dL Final  . RDW 07/10/2015 15.3* 11.5 - 14.5 % Final  . Platelets 07/10/2015 201  150 - 440 K/uL Final  . Neutrophils Relative % 07/10/2015 51   Final  . Neutro Abs 07/10/2015 3.0  1.4 - 6.5 K/uL Final  . Lymphocytes Relative 07/10/2015 31   Final  . Lymphs Abs 07/10/2015 1.8  1.0 - 3.6 K/uL Final  . Monocytes Relative 07/10/2015 15   Final  . Monocytes Absolute 07/10/2015 0.9  0.2 - 0.9 K/uL Final  . Eosinophils Relative 07/10/2015 2   Final  . Eosinophils Absolute 07/10/2015 0.1  0 - 0.7 K/uL Final  . Basophils Relative 07/10/2015 1   Final  . Basophils Absolute 07/10/2015 0.0  0 - 0.1 K/uL Final  . Sodium 07/10/2015 139  135 - 145 mmol/L Final  . Potassium 07/10/2015 4.3  3.5 - 5.1 mmol/L Final  . Chloride 07/10/2015 104  101 - 111 mmol/L Final  . CO2 07/10/2015 27  22 - 32 mmol/L Final  . Glucose, Bld 07/10/2015  90  65 - 99 mg/dL Final  . BUN 07/10/2015 18  6 - 20 mg/dL Final  . Creatinine, Ser 07/10/2015 0.95  0.44 - 1.00 mg/dL Final  . Calcium 07/10/2015 9.3  8.9 - 10.3 mg/dL Final  . Total Protein 07/10/2015 8.3* 6.5 - 8.1 g/dL Final  . Albumin 07/10/2015 4.3  3.5 - 5.0 g/dL Final  . AST 07/10/2015 25  15 - 41 U/L Final  . ALT 07/10/2015 16  14 - 54 U/L Final  . Alkaline Phosphatase 07/10/2015 100  38 - 126 U/L Final  . Total Bilirubin 07/10/2015 0.4  0.3 - 1.2 mg/dL Final  . GFR calc non Af Amer 07/10/2015 >60  >60 mL/min Final  .  GFR calc Af Amer 07/10/2015 >60  >60 mL/min Final   Comment: (NOTE) The eGFR has been calculated using the CKD EPI equation. This calculation has not been validated in all clinical situations. eGFR's persistently <60 mL/min signify possible Chronic Kidney Disease.   . Anion gap 07/10/2015 8  5 - 15 Final  . CA 27.29 07/10/2015 421.3* 0.0 - 38.6 U/mL Final   Comment: (NOTE) Bayer Centaur/ACS methodology Performed At: [facility] [address] [place], [place] [id number] Lindon Romp MD DX:[id number]     Assessment:  [name] [name] is a 65 y.o. African American woman with a history of stage I right breast cancer s/p modified radical mastectomy on 06/12/1996. Pathology revealed a grade III mutifocal infiltrating ductal carcinoma (tumor size 8.5 cm with an invasive component 1.6 cm). Twenty-one lymph nodes were negative. Tumor was ER/PR negative. She received 4 cycles of AC at [facility].  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 PET scan on 05/22/2012 revealed multiple areas of disease. She received 6 cycles of Taxotere and Cytoxan (TC) from 06/06/2012 - 09/29/2012. PET scan on 09/07/2012 noted marked improvement. Post chemotherapy biopsy was positive. She was not a surgical candidate. She was placed on Femara.  PET scan on 05/03/2013  revealed a new focus along lateral left breast and a new level II cervical lymph node. She received radiation from 09/13/2013 - 11/05/2013. PET scan on 01/01/2014 revealed new nodule medial left chest wall pleural/paraspinal activity LUL. Multiple biopsies on 01/19/2014 were positive. She began daily letrozole and Ibrance on 02/12/2014. Despite treatment, new nodules formed. She received a total of 2 cycles (delays in therapy).   PET scan on 05/09/2014 revealed marked progression of disease with multifocal masses in chest wall (left > right). She did not receive Imbrance in 04/2014. CA27.29 has increased: 22.9 on 01/08/2014, 54 on 03/05/2014, 170.8 on 05/06/2014, 250.8 on 05/23/2014, 445.8 on 06/14/2014, and 531.1 on 07/02/2014.   She received 3 cycles of Xeloda (02/12 - 07/05/2014). Chest and abdomen CT scan on 07/05/2014 revealed persistent and slightly progressive diffuse locally invasive chest wall tumor. Bone scan on 07/25/2014 revealed no evidence of metastatic disease.   She received 8 cycles of Halaven (08/06/2014 - 01/07/2015) on ACCRU [id number] I. 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  Chest, abdomen, and pelvic CT scan on 10/24/2014 revealed improved multifocal inflammatory left breast cancer.  The dominant 2.6 x 3.5 cm lesion in the left upper outer left breast had decreased to 1.7 x 3.5 cm. The anterior sternal lesion has decreased from 2 x 4 cm to 1.5 x 3.9 cm. There were no suspicious mediastinal, hilar or axillary adenopathy.    Chest, abdomen, and pelvic CT scan on 01/20/2015 revealed inflammatory left breast cancer with new necrotic appearing subcutaneous nodules in the lateral left breast and lateral left chest wall. CA27.29 was 339.8 on 08/06/2014, 51.7 on 10/15/2014, 37.6 on 12/10/2014, 26.4 on 01/07/2015, 34.1 on 01/28/2015, and 49.3 on 03/10/2015, and 258.6 on 06/12/2015.   She received 2 cycles of Taxol given 3 weeks on/1 week off (02/10/2015 -  03/10/2015).  Chest and abdomen CT scan on 04/03/2015 revealed interval growth of superficial tumors in the lateral left breast and lateral left chest wall.  In the left lateral chest, disease had increased from 2.3 x 2 to 2.9 x 2.2 cm.  In the superficial lateral left chest wall, disease had increased from 1.7 x  1.2 cm to 2.7 x 1.8 cm.  There were stable findings of locally infiltrative tumor in the left chest wall and left axilla, including extensive left chest wall skin thickening, infiltrative soft tissue encasing the left axillary artery and focal pleural thickening in the anterior left mid pleural space.  There was no metastatic disease in the abdomen.  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  Estrogen receptor was > 90%, progesterone receptor was > 90%, and Her2/neu was 1+.    Foundation One testing on 05/09/2015 noted genomic alterations of PIK3CA (X6728V), PTEN (splice site 791-5W>C), ESR1 (Y537C), CDH1 (A634V), CDKN1B (K40f*47), MLL2 (R1903*-subclonal), and MSH6 (F10830f5). Treatment options of Faslodex (ESR1) and everolimus (approved) and temsirolimus (approved therapy for other tumor types).  She began Faslodex on 05/08/2015 (last 06/12/2015).  Chest, abdomen, and pelvic CT scans on 07/09/2015 revealed interval progression of disease in the left anterior and lateral chest wall.  There was interval development of nodular pleural thickening in the posterior medial left hemi thorax with some focal fluid in the left major fissure. Findings raised concern for interval development of pleural involvement by tumor.  There was no evidence for metastatic disease in the abdomen   Symptomatically, she has increasing chest wall nodularity. Pain is controlled with Tramadol.  Plan: 1.  Labs today: CBC with diff, CMP, CA27.29. 2.  Review CT scan results.  Discuss progressive disease.  Discuss treatment options per [employer] One  (limited).  Patient interested in potential trial of Ibrance with Faslodex given delays in therapy with initial combination of Ibrance with Femara.  Will address with [facility].  Discuss everolimus option.  Discuss potential clinical trials if available. 3.  Postpone Faslodex today. 4.  Contact patient after follow-up with [facility]. 5.  Assist with obtaining wound care supplies.  Addendum:  [facility] Foundation One testing [facility], agrees with trial of Ibrance and Faslodex.  If progressive disease, will institute Aromasin + everolimus.  Follow-up with [facility] regarding patient response and potential new clinical trials in the future.   MeLequita AsalMD  07/10/2015

## 2015-07-13 ENCOUNTER — Encounter: Payer: Self-pay | Admitting: Hematology and Oncology

## 2015-07-13 ENCOUNTER — Other Ambulatory Visit: Payer: Self-pay | Admitting: Hematology and Oncology

## 2015-07-13 DIAGNOSIS — C50112 Malignant neoplasm of central portion of left female breast: Secondary | ICD-10-CM

## 2015-07-13 MED ORDER — PALBOCICLIB 125 MG PO CAPS
125.0000 mg | ORAL_CAPSULE | Freq: Every day | ORAL | Status: DC
Start: 2015-07-13 — End: 2015-09-30

## 2015-07-15 ENCOUNTER — Encounter: Payer: Medicare Other | Attending: Internal Medicine | Admitting: Internal Medicine

## 2015-07-15 ENCOUNTER — Inpatient Hospital Stay: Payer: Medicare Other

## 2015-07-15 ENCOUNTER — Other Ambulatory Visit: Payer: Medicare Other

## 2015-07-15 ENCOUNTER — Ambulatory Visit: Payer: Medicare Other

## 2015-07-15 ENCOUNTER — Ambulatory Visit: Payer: Medicare Other | Admitting: Hematology and Oncology

## 2015-07-15 DIAGNOSIS — C50412 Malignant neoplasm of upper-outer quadrant of left female breast: Secondary | ICD-10-CM | POA: Diagnosis not present

## 2015-07-15 DIAGNOSIS — I959 Hypotension, unspecified: Secondary | ICD-10-CM | POA: Diagnosis not present

## 2015-07-15 DIAGNOSIS — S21001A Unspecified open wound of right breast, initial encounter: Secondary | ICD-10-CM | POA: Insufficient documentation

## 2015-07-15 DIAGNOSIS — S21002A Unspecified open wound of left breast, initial encounter: Secondary | ICD-10-CM | POA: Insufficient documentation

## 2015-07-15 DIAGNOSIS — Z9221 Personal history of antineoplastic chemotherapy: Secondary | ICD-10-CM | POA: Diagnosis not present

## 2015-07-15 DIAGNOSIS — X58XXXA Exposure to other specified factors, initial encounter: Secondary | ICD-10-CM | POA: Insufficient documentation

## 2015-07-15 DIAGNOSIS — C50812 Malignant neoplasm of overlapping sites of left female breast: Secondary | ICD-10-CM | POA: Insufficient documentation

## 2015-07-16 ENCOUNTER — Telehealth: Payer: Self-pay | Admitting: *Deleted

## 2015-07-16 NOTE — Progress Notes (Signed)
MYLIAH, SPROWL ([id number]) Visit Report for 07/15/2015 Arrival Information Details Patient Name: [name] [name], [name] [name] Date of Service: 07/15/2015 10:00 AM Medical Record Patient Account Number: [id number] Number: Treating RN: Baruch Gouty, RN, BSN, Rita 1951/02/14 [phone] y.o. Other Clinician: Date of Birth/Sex: Female) Treating ROBSON, MICHAEL Primary Care Physician/Extender: G PATIENT, NO Physician: Referring Physician: Weeks in Treatment: 80 Visit Information History Since Last Visit Added or deleted any medications: No Patient Arrived: Ambulatory Any new allergies or adverse reactions: No Arrival Time: 09:59 Had a fall or experienced change in No Accompanied By: self activities of daily living that may affect Transfer Assistance: None risk of falls: Patient Identification Verified: Yes Signs or symptoms of abuse/neglect since last No Secondary Verification Process Yes visito Completed: Hospitalized since last visit: No Patient Requires Transmission-Based No Has Dressing in Place as Prescribed: Yes Precautions: Pain Present Now: No Patient Has Alerts: No Electronic Signature(s) Signed: 07/15/2015 4:43:54 PM By: Regan Lemming BSN, RN Entered By: Regan Lemming on 07/15/2015 10:00:03 [name] [name] ([id number]) -------------------------------------------------------------------------------- Clinic Level of Care Assessment Details Patient Name: [name] [name] Date of Service: 07/15/2015 10:00 AM Medical Record Patient Account Number: [id number] Number: Treating RN: Baruch Gouty, RN, BSN, Rita 11/10/1950 [phone] y.o. Other Clinician: Date of Birth/Sex: Female) Treating ROBSON, MICHAEL Primary Care Physician/Extender: G PATIENT, NO Physician: Referring Physician: Weeks in Treatment: 61 Clinic Level of Care Assessment Items TOOL 4 Quantity Score []  - Use when only an EandM is performed on FOLLOW-UP visit 0 ASSESSMENTS - Nursing Assessment / Reassessment X - Reassessment  of Co-morbidities (includes updates in patient status) 1 10 X - Reassessment of Adherence to Treatment Plan 1 5 ASSESSMENTS - Wound and Skin Assessment / Reassessment []  - Simple Wound Assessment / Reassessment - one wound 0 X - Complex Wound Assessment / Reassessment - multiple wounds 2 5 []  - Dermatologic / Skin Assessment (not related to wound area) 0 ASSESSMENTS - Focused Assessment []  - Circumferential Edema Measurements - multi extremities 0 []  - Nutritional Assessment / Counseling / Intervention 0 []  - Lower Extremity Assessment (monofilament, tuning fork, pulses) 0 []  - Peripheral Arterial Disease Assessment (using hand held doppler) 0 ASSESSMENTS - Ostomy and/or Continence Assessment and Care []  - Incontinence Assessment and Management 0 []  - Ostomy Care Assessment and Management (repouching, etc.) 0 PROCESS - Coordination of Care X - Simple Patient / Family Education for ongoing care 1 15 []  - Complex (extensive) Patient / Family Education for ongoing care 0 []  - Staff obtains Consents, Records, Test Results / Process Orders 0 [name] [name], [name] [name] ([id number]) []  - Staff telephones HHA, Nursing Homes / Clarify orders / etc 0 []  - Routine Transfer to another Facility (non-emergent condition) 0 []  - Routine Hospital Admission (non-emergent condition) 0 []  - New Admissions / Biomedical engineer / Ordering NPWT, Apligraf, etc. 0 []  - Emergency Hospital Admission (emergent condition) 0 []  - Simple Discharge Coordination 0 []  - Complex (extensive) Discharge Coordination 0 PROCESS - Special Needs []  - Pediatric / Minor Patient Management 0 []  - Isolation Patient Management 0 []  - Hearing / Language / Visual special needs 0 []  - Assessment of Community assistance (transportation, D/C planning, etc.) 0 []  - Additional assistance / Altered mentation 0 []  - Support Surface(s) Assessment (bed, cushion, seat, etc.) 0 INTERVENTIONS - Wound Cleansing / Measurement []  - Simple Wound  Cleansing - one wound 0 X - Complex Wound Cleansing - multiple wounds 2 5 X - Wound Imaging (photographs - any number of wounds) 1 5 []  -  Wound Tracing (instead of photographs) 0 []  - Simple Wound Measurement - one wound 0 X - Complex Wound Measurement - multiple wounds 2 5 INTERVENTIONS - Wound Dressings X - Small Wound Dressing one or multiple wounds 2 10 []  - Medium Wound Dressing one or multiple wounds 0 []  - Large Wound Dressing one or multiple wounds 0 []  - Application of Medications - topical 0 []  - Application of Medications - injection 0 [name] [name], [name] INTERVENTIONS - Miscellaneous []  - External ear exam 0 []  - Specimen Collection (cultures, biopsies, blood, body fluids, etc.) 0 []  - Specimen(s) / Culture(s) sent or taken to Lab for analysis 0 []  - Patient Transfer (multiple staff / Harrel Lemon Lift / Similar devices) 0 []  - Simple Staple / Suture removal (25 or less) 0 []  - Complex Staple / Suture removal (26 or more) 0 []  - Hypo / Hyperglycemic Management (close monitor of Blood Glucose) 0 []  - Ankle / Brachial Index (ABI) - do not check if billed separately 0 X - Vital Signs 1 5 Has the patient been seen at the hospital within the last three years: Yes Total Score: 90 Level Of Care: New/Established - Level 3 Electronic Signature(s) Signed: 07/15/2015 4:43:54 PM By: Regan Lemming BSN, RN Entered By: Regan Lemming on 07/15/2015 10:16:20 [name] [name] ([id number]) -------------------------------------------------------------------------------- Encounter Discharge Information Details Patient Name: [name] [name] Date of Service: 07/15/2015 10:00 AM Medical Record Patient Account Number: [id number] Number: Treating RN: Baruch Gouty, RN, BSN, Rita 03-Sep-1950 [phone] y.o. Other Clinician: Date of Birth/Sex: Female) Treating ROBSON, MICHAEL Primary Care Physician/Extender: G PATIENT, NO Physician: Referring Physician: Weeks in Treatment: 78 Encounter Discharge  Information Items Discharge Pain Level: 0 Discharge Condition: Stable Ambulatory Status: Ambulatory Discharge Destination: Home Transportation: Private Auto Accompanied By: self Schedule Follow-up Appointment: No Medication Reconciliation completed No and provided to Patient/Care Jory Welke: Patient Clinical Summary of Care: Declined Electronic Signature(s) Signed: 07/15/2015 10:26:57 AM By: Ruthine Dose Entered By: Ruthine Dose on 07/15/2015 10:26:56 [name] [name] ([id number]) -------------------------------------------------------------------------------- Multi Wound Chart Details Patient Name: [name] [name] Date of Service: 07/15/2015 10:00 AM Medical Record Patient Account Number: [id number] Number: Treating RN: Baruch Gouty, RN, BSN, Rita 1950-10-20 [phone] y.o. Other Clinician: Date of Birth/Sex: Female) Treating ROBSON, MICHAEL Primary Care Physician/Extender: G PATIENT, NO Physician: Referring Physician: Weeks in Treatment: 52 Vital Signs Height(in): 68 Pulse(bpm): 96 Weight(lbs): 180.5 Blood Pressure 99/75 (mmHg): Body Mass Index(BMI): 27 Temperature(F): 98.7 Respiratory Rate 16 (breaths/min): Photos: [1:No Photos] [4:No Photos] [N/A:N/A] Wound Location: [1:Left Chest] [4:Left Chest - Distal] [N/A:N/A] Wounding Event: [1:Other Lesion] [4:Radiation Burn] [N/A:N/A] Primary Etiology: [1:Malignant Wound] [4:Atypical] [N/A:N/A] Comorbid History: [1:Received Chemotherapy, Received Radiation] [4:Received Chemotherapy, Received Radiation] [N/A:N/A] Date Acquired: [1:05/10/2014] [4:07/13/2015] [N/A:N/A] Weeks of Treatment: [1:52] [4:0] [N/A:N/A] Wound Status: [1:Open] [4:Open] [N/A:N/A] Measurements L x W x D 4.5x6x0.2 [4:1x1.3x0.2] [N/A:N/A] (cm) Area (cm) : [1:21.206] [4:1.021] [N/A:N/A] Volume (cm) : [1:4.241] [4:0.204] [N/A:N/A] % Reduction in Area: [1:10.00%] [4:0.00%] [N/A:N/A] % Reduction in Volume: 10.00% [4:0.00%] [N/A:N/A] Classification:  [1:Full Thickness Without Exposed Support Structures] [4:Full Thickness Without Exposed Support Structures] [N/A:N/A] Exudate Amount: [1:Large] [4:Medium] [N/A:N/A] Exudate Type: [1:Serosanguineous] [4:Serosanguineous] [N/A:N/A] Exudate Color: [1:red, brown] [4:red, brown] [N/A:N/A] Wound Margin: [1:Distinct, outline attached] [4:Fibrotic scar, thickened scar] [N/A:N/A] Granulation Amount: [1:Small (1-33%)] [4:Medium (34-66%)] [N/A:N/A] Granulation Quality: [1:Pink, Pale] [4:Pink, Pale] [N/A:N/A] Necrotic Amount: [1:Large (67-100%)] [4:Medium (34-66%)] [N/A:N/A] Exposed Structures: [N/A:N/A] Fascia: No Fascia: No Fat: No Fat: No Tendon: No Tendon: No Muscle: No Muscle: No Joint: No Joint: No Bone:  No Bone: No Limited to Skin Limited to Skin Breakdown Breakdown Epithelialization: None None N/A Periwound Skin Texture: Induration: Yes Edema: No N/A Scarring: Yes Excoriation: No Edema: No Induration: No Excoriation: No Callus: No Callus: No Crepitus: No Crepitus: No Fluctuance: No Fluctuance: No Friable: No Friable: No Rash: No Rash: No Scarring: No Periwound Skin No Abnormalities Noted Moist: Yes N/A Moisture: Maceration: No Dry/Scaly: No Periwound Skin Color: No Abnormalities Noted Atrophie Blanche: No N/A Cyanosis: No Ecchymosis: No Erythema: No Hemosiderin Staining: No Mottled: No Pallor: No Rubor: No Temperature: No Abnormality No Abnormality N/A Tenderness on No No N/A Palpation: Wound Preparation: Ulcer Cleansing: Ulcer Cleansing: N/A Rinsed/Irrigated with Rinsed/Irrigated with Saline Saline Topical Anesthetic Topical Anesthetic Applied: Other: lidocaine Applied: Other: lidocaine 4% 4% Treatment Notes Electronic Signature(s) Signed: 07/15/2015 4:43:54 PM By: Regan Lemming BSN, RN Entered By: Regan Lemming on 07/15/2015 10:13:55 [name] [name]  ([id number]) -------------------------------------------------------------------------------- Multi-Disciplinary Care Plan Details Patient Name: [name] [name] Date of Service: 07/15/2015 10:00 AM Medical Record Patient Account Number: [id number] Number: Treating RN: Baruch Gouty, RN, BSN, Rita [birth year] [phone] y.o. Other Clinician: Date of Birth/Sex: Female) Treating ROBSON, MICHAEL Primary Care Physician/Extender: G PATIENT, NO Physician: Referring Physician: Weeks in Treatment: 46 Active Inactive Abuse / Safety / Falls / Self Care Management Nursing Diagnoses: Potential for falls Goals: Patient will remain injury free Date Initiated: 07/11/2014 Goal Status: Active Interventions: Assess fall risk on admission and as needed Notes: Malignancy/Atypical Etiology Nursing Diagnoses: Knowledge deficit related to disease process and management of atypical ulcer etiology Goals: Patient/caregiver will verbalize understanding of disease process and disease management of atypical ulcer etiology Date Initiated: 07/11/2014 Goal Status: Active Interventions: Provide education on atypical ulcer etiologies Notes: Nutrition Nursing Diagnoses: Potential for alteratiion in Nutrition/Potential for imbalanced nutrition [name] [name], [name] [name]. ([id number]) Goals: Patient/caregiver agrees to and verbalizes understanding of need to use nutritional supplements and/or vitamins as prescribed Date Initiated: 07/11/2014 Goal Status: Active Interventions: Assess patient nutrition upon admission and as needed per policy Notes: Orientation to the Wound Care Program Nursing Diagnoses: Knowledge deficit related to the wound healing center program Goals: Patient/caregiver will verbalize understanding of the [place] Date Initiated: 07/11/2014 Goal Status: Active Interventions: Provide education on orientation to the wound center Notes: Wound/Skin Impairment Nursing  Diagnoses: Impaired tissue integrity Goals: Patient will demonstrate a reduced rate of smoking or cessation of smoking Date Initiated: 07/11/2014 Goal Status: Active Patient will have a decrease in wound volume by X% from date: (specify in notes) Date Initiated: 07/11/2014 Goal Status: Active Patient/caregiver will verbalize understanding of skin care regimen Date Initiated: 07/11/2014 Goal Status: Active Ulcer/skin breakdown will have a volume reduction of 30% by week 4 Date Initiated: 07/11/2014 Goal Status: Active Ulcer/skin breakdown will have a volume reduction of 50% by week 8 Date Initiated: 07/11/2014 [name] [name] ([id number]) Goal Status: Active Ulcer/skin breakdown will have a volume reduction of 80% by week 12 Date Initiated: 07/11/2014 Goal Status: Active Ulcer/skin breakdown will heal within 14 weeks Date Initiated: 07/11/2014 Goal Status: Active Interventions: Assess patient/caregiver ability to obtain necessary supplies Assess patient/caregiver ability to perform ulcer/skin care regimen upon admission and as needed Assess ulceration(s) every visit Provide education on smoking Provide education on ulcer and skin care Screen for HBO Notes: Electronic Signature(s) Signed: 07/15/2015 4:43:54 PM By: Regan Lemming BSN, RN Entered By: Regan Lemming on 07/15/2015 10:08:36 [name] [name] ([id number]) -------------------------------------------------------------------------------- Pain Assessment Details Patient Name: [name] [name] Date of Service: 07/15/2015 10:00 AM Medical Record Patient  Account Number: [id number] Number: Treating RN: Baruch Gouty, RN, BSN, Rita [birth year] [phone] y.o. Other Clinician: Date of Birth/Sex: Female) Treating ROBSON, MICHAEL Primary Care Physician/Extender: G PATIENT, NO Physician: Referring Physician: Weeks in Treatment: 68 Active Problems Location of Pain Severity and Description of Pain Patient Has Paino No Site Locations Pain  Management and Medication Current Pain Management: Electronic Signature(s) Signed: 07/15/2015 4:43:54 PM By: Regan Lemming BSN, RN Entered By: Regan Lemming on 07/15/2015 10:00:20 [name] [name] ([id number]) -------------------------------------------------------------------------------- Patient/Caregiver Education Details Patient Name: [name] [name] Date of Service: 07/15/2015 10:00 AM Medical Record Patient Account Number: [id number] Number: Treating RN: Baruch Gouty, RN, BSN, Rita [birth year] [phone] y.o. Other Clinician: Date of Birth/Gender: Female) Treating ROBSON, MICHAEL Primary Care Physician/Extender: G PATIENT, NO Physician: Weeks in Treatment: 11 Referring Physician: Education Assessment Education Provided To: Patient Education Topics Provided Malignant/Atypical Wounds: Methods: Explain/Verbal Responses: State content correctly Smoking and Wound Healing: Methods: Explain/Verbal Responses: State content correctly Welcome To [facility]: Methods: Explain/Verbal Responses: State content correctly Wound/Skin Impairment: Methods: Explain/Verbal Responses: State content correctly Electronic Signature(s) Signed: 07/15/2015 4:43:54 PM By: Regan Lemming BSN, RN Entered By: Regan Lemming on 07/15/2015 10:24:27 [name] [name] ([id number]) -------------------------------------------------------------------------------- Wound Assessment Details Patient Name: [name] [name] Date of Service: 07/15/2015 10:00 AM Medical Record Patient Account Number: [id number] Number: Treating RN: Baruch Gouty, RN, BSN, Rita 10-18-1950 [phone] y.o. Other Clinician: Date of Birth/Sex: Female) Treating ROBSON, MICHAEL Primary Care Physician/Extender: G PATIENT, NO Physician: Referring Physician: Weeks in Treatment: 52 Wound Status Wound Number: 1 Primary Malignant Wound Etiology: Wound Location: Left Chest Wound Status: Open Wounding Event: Other Lesion Comorbid Received  Chemotherapy, Received Date Acquired: 05/10/2014 History: Radiation Weeks Of Treatment: 52 Clustered Wound: No Photos Photo Uploaded By: Regan Lemming on 07/15/2015 11:59:30 Wound Measurements Length: (cm) 4.5 Width: (cm) 6 Depth: (cm) 0.2 Area: (cm) 21.206 Volume: (cm) 4.241 % Reduction in Area: 10% % Reduction in Volume: 10% Epithelialization: None Tunneling: No Undermining: No Wound Description Full Thickness Without Exposed Classification: Support Structures Wound Margin: Distinct, outline attached Exudate Large Amount: Exudate Type: Serosanguineous Exudate Color: red, brown Foul Odor After Cleansing: No Wound Bed Lapage, Tyrah W. ([id number]) Granulation Amount: Small (1-33%) Exposed Structure Granulation Quality: Pink, Pale Fascia Exposed: No Necrotic Amount: Large (67-100%) Fat Layer Exposed: No Necrotic Quality: Adherent Slough Tendon Exposed: No Muscle Exposed: No Joint Exposed: No Bone Exposed: No Limited to Skin Breakdown Periwound Skin Texture Texture Color No Abnormalities Noted: No No Abnormalities Noted: Yes Callus: No Temperature / Pain Crepitus: No Temperature: No Abnormality Excoriation: No Fluctuance: No Friable: No Induration: Yes Localized Edema: No Rash: No Scarring: Yes Moisture No Abnormalities Noted: Yes Wound Preparation Ulcer Cleansing: Rinsed/Irrigated with Saline Topical Anesthetic Applied: Other: lidocaine 4%, Treatment Notes Wound #1 (Left Chest) 1. Cleansed with: Clean wound with Normal Saline 4. Dressing Applied: Aquacel Ag 5. Secondary Dressing Applied Bordered Foam Dressing Electronic Signature(s) Signed: 07/15/2015 4:43:54 PM By: Regan Lemming BSN, RN Entered By: Regan Lemming on 07/15/2015 10:03:54 [name] [name] ([id number]) -------------------------------------------------------------------------------- Wound Assessment Details Patient Name: [name] [name] Date of Service: 07/15/2015 10:00 AM Medical  Record Patient Account Number: [id number] Number: Treating RN: Baruch Gouty, RN, BSN, Rita 1951-02-05 [phone] y.o. Other Clinician: Date of Birth/Sex: Female) Treating ROBSON, MICHAEL Primary Care Physician/Extender: G PATIENT, NO Physician: Referring Physician: Weeks in Treatment: 52 Wound Status Wound Number: 4 Primary Atypical Etiology: Wound Location: Left Chest - Distal Wound Status: Open Wounding Event: Radiation Burn Comorbid Received Chemotherapy, Received Date Acquired:  07/13/2015 History: Radiation Weeks Of Treatment: 0 Clustered Wound: No Photos Photo Uploaded By: Regan Lemming on 07/15/2015 11:59:51 Wound Measurements Length: (cm) 1 Width: (cm) 1.3 Depth: (cm) 0.2 Area: (cm) 1.021 Volume: (cm) 0.204 % Reduction in Area: 0% % Reduction in Volume: 0% Epithelialization: None Tunneling: No Wound Description Full Thickness Without Exposed Classification: Support Structures Wound Margin: Fibrotic scar, thickened scar Exudate Medium Amount: Exudate Type: Serosanguineous Exudate Color: red, brown Foul Odor After Cleansing: No Wound Bed Brashears, Shevaun W. ([id number]) Granulation Amount: Medium (34-66%) Exposed Structure Granulation Quality: Pink, Pale Fascia Exposed: No Necrotic Amount: Medium (34-66%) Fat Layer Exposed: No Necrotic Quality: Adherent Slough Tendon Exposed: No Muscle Exposed: No Joint Exposed: No Bone Exposed: No Limited to Skin Breakdown Periwound Skin Texture Texture Color No Abnormalities Noted: No No Abnormalities Noted: No Callus: No Atrophie Blanche: No Crepitus: No Cyanosis: No Excoriation: No Ecchymosis: No Fluctuance: No Erythema: No Friable: No Hemosiderin Staining: No Induration: No Mottled: No Localized Edema: No Pallor: No Rash: No Rubor: No Scarring: No Temperature / Pain Moisture Temperature: No Abnormality No Abnormalities Noted: No Dry / Scaly: No Maceration: No Moist: Yes Wound Preparation Ulcer  Cleansing: Rinsed/Irrigated with Saline Topical Anesthetic Applied: Other: lidocaine 4%, Treatment Notes Wound #4 (Left, Distal Chest) 1. Cleansed with: Clean wound with Normal Saline 4. Dressing Applied: Aquacel Ag 5. Secondary Dressing Applied Bordered Foam Dressing Electronic Signature(s) Signed: 07/15/2015 4:43:54 PM By: Regan Lemming BSN, RN Entered By: Regan Lemming on 07/15/2015 10:07:34 [name] [name] ([id number]) -------------------------------------------------------------------------------- [place] Details Patient Name: [name] [name] Date of Service: 07/15/2015 10:00 AM Medical Record Patient Account Number: [id number] Number: Treating RN: Baruch Gouty, RN, BSN, Rita 05/05/1950 [phone] y.o. Other Clinician: Date of Birth/Sex: Female) Treating ROBSON, MICHAEL Primary Care Physician/Extender: G PATIENT, NO Physician: Referring Physician: Weeks in Treatment: 52 Vital Signs Time Taken: 10:00 Temperature (F): 98.7 Height (in): 68 Pulse (bpm): 96 Weight (lbs): 180.5 Respiratory Rate (breaths/min): 16 Body Mass Index (BMI): 27.4 Blood Pressure (mmHg): 99/75 Reference Range: 80 - 120 mg / dl Electronic Signature(s) Signed: 07/15/2015 4:43:54 PM By: Regan Lemming BSN, RN Entered By: Regan Lemming on 07/15/2015 10:00:46

## 2015-07-16 NOTE — Progress Notes (Signed)
[name] [name], [name] [name] ([id number]) Visit Report for 07/15/2015 Chief Complaint Document Details Patient Name: [name] [name], [name] [name] Date of Service: 07/15/2015 10:00 AM Medical Record Patient Account Number: [id number] Number: Treating RN: Baruch Gouty, RN, BSN, Rita [birth year] [phone] y.o. 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 07/18/2014 -- since last week the patient has had no complaints and is on her last cycle of chemotherapy. I have reviewed 155 pages of her reports from a medical oncologist and the summary is made in the history of present illness. Electronic Signature(s) Signed: 07/16/2015 7:58:31 AM By: Linton Ham MD Entered By: Linton Ham on 07/15/2015 10:20:10 [name] [name] ([id number]) -------------------------------------------------------------------------------- HPI Details Patient Name: [name] [name] Date of Service: 07/15/2015 10:00 AM Medical Record Patient Account Number: [id number] Number: Treating RN: Baruch Gouty, RN, BSN, Rita 02/03/1951 [phone] y.o. Other Clinician: Date of Birth/Sex: Female) Treating [name] Primary Care Physician/Extender: G PATIENT, NO Physician: Referring Physician: Weeks in Treatment: 35 History of Present Illness HPI Description: this 65 year old patient has come by herself today as a self-referral and has no documentation with her. She has had open wounds to her left chest and the right chest wall for about 2 months. She is not a diabetic and has no significant medical history except breast cancer. Her right breast cancer  was diagnosed 18 years ago and she had a mastectomy and chemotherapy. 3 years ago she was then diagnosed with a breast cancer of the left side which was advanced and could not be operated and was given chemotherapy for a year and then followed by radiation. Her last radiation was over a year ago. She has not had any biopsy done recently from the ulcerated area but she says there have been other biopsies done from the chest wall and these reports are not available at the present time. She was told to keep the wounds open and let them dry out but she is finding it's more and more difficult to stop soiling her clothes if she leaves wounds open. She is on chemotherapy and we will try and obtain these notes from her oncologist. She is not in pain and does not have any significant problems except the discomfort from an open wound on the chest wall. 07/18/2014 This is a summary of [name] [name] date of birth 1950/10/21. These are obtained by reviewing 151 pages of medical records which were obtained from the [facility] 65 year old patient who had a history of stage I right breast cancer status post modified radical mastectomy on 06/12/1996. 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 She received 4 cycles of AC at [facility]. In January 2014 she presented with left breast swelling and apparently cellulitis. Breast biopsy done revealed lobular carcinoma which was ER and PR positive and HER-2/neu negative. On PET scan and she was shown to have multiple areas of disease left breast, axilla and supraclavicular areas. She received 6 cycles of chemotherapy and then received Femara because she was not a surgical candidate. PET scan also revealed disease in the left lateral breast and new cervical lymph nodes at level II. The patient  was then sent for radiation therapy and received this over a month. In January 2016 she had a PET CT scan done  which was compatible with marked progression of disease with multifocal soft tissue masses in the chest wall left greater than right. She is currently receiving Xeloda and is noted to have disease in the chest wall which is widely present. Her most recent lab work from 07/02/2014 shows that her CMP is within normal limits with a serum albumin of 4.4. The previous CBC was within normal limits with a WBC count of 4.3 hemoglobin of 14 hematocrit of 40.9 and platelets of 202. 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 We will continue with palliative care and make her comfortable as much as possible as far as her wound care goes. Present time there is no odor but if this does occur we would use carbon- Patlan, [name] ([id number]) based products to mask the odor. Notes were also reviewed from her surgeon Dr. Hervey Ard --closure on 05/27/2014 for breast biopsy in view of the fact that she had extensive disease on her chest wall. Biopsies are taken from the nodular metastatic disease overlying the sternum and nodules chosen for biopsy. Punch biopsies were taken and the pathology on this confirmed that this was consistent with carcinoma 08/15/2014 -- she is started on a new course of injectable chemotherapy and this is 2 weeks on and 2 weeks off. She has no new symptoms no bleeding from the wounds nor is there any odor. She is doing fine otherwise. 09/16/2014. She is doing very well and the right side wounds have completely healed. She has finished 2 cycles of chemotherapy and she is 2 weeks on and 2 weeks off. He is to restart her chemotherapy this week. No fresh complaints pain is within control and there is no odor from the wounds. 10/21/2014 -- overall she is doing very well and is on a  clinical trial with her medical oncologist. No issues with the wound on her left side and there is no odor or drainage. 11/25/2014 -- she continues to be under clinical trial protocol with the medical oncologist and has no fresh issues. 02/17/2015 -- she has been taken off the clinical trial protocol and now is on Taxol on a regular basis with her medical oncologists Dr. Susy Manor. 05/20/15; the patient follow see her for a nonhealing area on the left lateral chest. 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 She has been using Aquacel Ag on this area on a palliative basis. No debridement has been done. 06/17/15; the patient follows in this clinic monthly for a nonhealing area on her left lateral chest. She has known metastatic breast cancer which is metastatic to the chest wall. She returns today with a cluster of open areas on the lateral aspect of her chest. The nurses in the clinic described this as a large open area that progressively closed over. I've taken this opportunity to review her oncology status. On her arrival here she asked me to debridement the wounds on her chest stating that this was a request of [name] her medical oncologist. This is a patient who initially developed stage I right breast cancer treated with a modified radical mastectomy in 1998.  She really presented with inflammatory breast cancer in 2014 post chemotherapy biopsy was positive she was not a surgical candidate. Since then she has been treated with multiple regimens. Most particular to her wounds PET scan in January 2016 revealed marked progression of the disease with multifocal masses in the chest wall left greater than right. 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 Left chest wall biopsy on 04/29/15 revealed  metastatic carcinoma consistent with invasive lobular carcinoma. She has been receiving Faslodex. Weekly. She missed her last injection and is attributing this to the breakdown of tissue on her left chest wall. She was seen by [name] in early March however the record is not complete 07/16/15; patient arrives for her monthly visit the. I've reviewed her oncologist notes Dr. Nolon Stalls. Her notes for the increasing nodularity in the chest wall look. Her pain seems adequately controlled with tramadol. The measurements of the necrotic areas seem to be increasing. They are covered with a thick surface slough however I don't believe the debridement is indicated here. She is dressing knees with Aquacel Ag and foam Electronic Signature(s) Signed: 07/16/2015 7:58:31 AM By: Linton Ham MD Entered By: Linton Ham on 07/15/2015 10:21:30 [name] [name] ([id number]) [name] [name], [name] [name] ([id number]) -------------------------------------------------------------------------------- Physical Exam Details Patient Name: [name] [name] Date of Service: 07/15/2015 10:00 AM Medical Record Patient Account Number: [id number] Number: Treating RN: Baruch Gouty, RN, BSN, Rita [birth year] [phone] y.o. Other Clinician: Date of Birth/Sex: Female) Treating Phuc Kluttz Primary Care Physician/Extender: G PATIENT, NO Physician: Referring Physician: Weeks in Treatment: 20 Constitutional Patient is hypotensive. She appears well. Pulse regular and within target range for patient.Marland Kitchen Respirations regular, non-labored and within target range.. Temperature is normal and within the target range for the patient.. Patient's appearance is neat and clean. Appears in no acute distress. Well nourished and well developed.. Lymphatic I don't feel any nodes in the left axilla however there is notes in the infraclavicular area on the left. No lymphadenopathy or glandular swelling noted in neck.. Integumentary (Hair,  Skin) Multiple areas of open wounds across this unfortunate woman's left chest the. She also has multiple subcutaneous nodules which are likely malignant to.Marland Kitchen Psychiatric No evidence of depression, anxiety, or agitation. Calm, cooperative, and communicative. Appropriate interactions and affect.. Notes Wound exam; there is an expansion of the necrotic wound base on the left anterior chest multiple nodules which are likely malignant with thin the skin that is still present. She has a significant mass in the breast area. No debridement is indicated here, I am concerned that this is going to expand into a fairly extensive necrotic area. Electronic Signature(s) Signed: 07/16/2015 7:58:31 AM By: Linton Ham MD Entered By: Linton Ham on 07/15/2015 10:25:24 [name] [name] ([id number]) -------------------------------------------------------------------------------- Physician Orders Details Patient Name: [name] [name] Date of Service: 07/15/2015 10:00 AM Medical Record Patient Account Number: [id number] Number: Treating RN: Baruch Gouty, RN, BSN, Rita [birth year] [phone] y.o. Other Clinician: Date of Birth/Sex: Female) Treating Matis Monnier Primary Care Physician/Extender: G PATIENT, NO Physician: Referring Physician: Weeks in Treatment: 84 Verbal / Phone Orders: Yes Clinician: Afful, RN, BSN, Rita Read Back and Verified: Yes Diagnosis Coding Wound Cleansing Wound #1 Left Chest o Clean wound with Normal Saline. Wound #4 Left,Distal Chest o Clean wound with Normal Saline. Primary Wound Dressing Wound #1 Left Chest o Aquacel Ag Wound #4 Left,Distal Chest o Aquacel Ag Secondary  Dressing Wound #1 Left Chest o Boardered Foam Dressing Wound #4 Left,Distal Chest o Boardered Foam Dressing Dressing Change Frequency Wound #1 Left Chest o Change dressing every other day. - or more if needed Follow-up Appointments Wound #1 Left Chest o Return Appointment in  1 month Electronic Signature(s) Signed: 07/15/2015 4:43:54 PM By: Regan Lemming BSN, RN Signed: 07/16/2015 7:58:31 AM By: Linton Ham MD [name] [name] ([id number]) Entered By: Regan Lemming on 07/15/2015 10:16:54 [name] [name] ([id number]) -------------------------------------------------------------------------------- Problem List Details Patient Name: [name] [name] Date of Service: 07/15/2015 10:00 AM Medical Record Patient Account Number: [id number] Number: Treating RN: Baruch Gouty, RN, BSN, Rita [birth year] [phone] y.o. Other Clinician: Date of Birth/Sex: Female) Treating Byrd Terrero Primary Care Physician/Extender: G PATIENT, NO Physician: Referring Physician: Weeks in Treatment: 49 Active Problems ICD-10 Encounter Code Description Active Date Diagnosis C50.812 Malignant neoplasm of overlapping sites of left female 07/11/2014 Yes breast S21.001A Unspecified open wound of right breast, initial encounter 07/11/2014 Yes C50.412 Malignant neoplasm of upper-outer quadrant of left female 07/11/2014 Yes breast S21.002A Unspecified open wound of left breast, initial encounter 07/11/2014 Yes Inactive Problems Resolved Problems Electronic Signature(s) Signed: 07/16/2015 7:58:31 AM By: Linton Ham MD Entered By: Linton Ham on 07/15/2015 10:19:50 [name] [name] ([id number]) -------------------------------------------------------------------------------- Progress Note Details Patient Name: [name] [name] Date of Service: 07/15/2015 10:00 AM Medical Record Patient Account Number: [id number] Number: Treating RN: Baruch Gouty, RN, BSN, Rita July 27, 1950 [phone] y.o. 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 07/18/2014 -- since last week the patient has had no complaints and is on her last cycle of chemotherapy. I have reviewed 155 pages of her reports from a medical oncologist and the summary is made in the history of present illness. History of Present Illness (HPI) this 65 year old patient has come by herself today as a self-referral and has no documentation with her. She has had open wounds to her left chest and the right chest wall for about 2 months. She is not a diabetic and has no significant medical history except breast cancer. Her right breast cancer was diagnosed 18 years ago and she had a mastectomy and chemotherapy. 3 years ago she was then diagnosed with a breast cancer of the left side which was advanced and could not be operated and was given chemotherapy for a year and then followed by radiation. Her last radiation was over a year ago. She has not had any biopsy done recently from the ulcerated area but she says there have been other biopsies done from the chest wall and these reports are not available at the present time. She was told to keep the wounds open and let them dry out but she is finding it's more and more difficult to stop soiling her clothes if she leaves wounds open. She is on chemotherapy and we will try and obtain these notes from her oncologist. She is not in pain and does not have any significant problems except the discomfort from an open wound on the chest wall. 07/18/2014 This is a summary of [name] [name] date of birth 05/24/1950. These are obtained by reviewing 151 pages of medical records  which were obtained from the [facility] 65 year old patient who had a history of stage I right breast cancer status post modified radical mastectomy on 06/12/1996. She had an infiltrating ductal carcinoma, 21  lymph nodes were negative and tumor was ER/PR positive. She received 4 cycles of AC at [facility]. In January 2014 she presented with left breast swelling and apparently cellulitis. Breast biopsy done revealed lobular carcinoma which was ER and PR positive and HER-2/neu negative. On PET scan and she was shown to have multiple areas of disease left breast, axilla and supraclavicular areas. She received 6 cycles of chemotherapy and then received Femara because she was not a surgical candidate. PET scan also revealed disease in the left lateral breast and new cervical lymph nodes at level II. The patient was then sent for radiation therapy and received this over a month. In January 2016 she had a PET CT scan done which was compatible with marked progression of disease [name] [name], [name] ([id number]) with multifocal soft tissue masses in the chest wall left greater than right. She is currently receiving Xeloda and is noted to have disease in the chest wall which is widely present. Her most recent lab work from 07/02/2014 shows that her CMP is within normal limits with a serum albumin of 4.4. The previous CBC was within normal limits with a WBC count of 4.3 hemoglobin of 14 hematocrit of 40.9 and platelets of 202. 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 We will continue with palliative care and make her comfortable as much as possible as far as her wound care goes. Present time there is no odor but if this does occur we would use carbon- based products to mask the odor. Notes were also reviewed from her surgeon Dr. Hervey Ard --closure on 05/27/2014 for breast biopsy in view of the fact that she had extensive disease on her chest wall. Biopsies are taken from the nodular metastatic disease overlying the  sternum and nodules chosen for biopsy. Punch biopsies were taken and the pathology on this confirmed that this was consistent with carcinoma 08/15/2014 -- she is started on a new course of injectable chemotherapy and this is 2 weeks on and 2 weeks off. She has no new symptoms no bleeding from the wounds nor is there any odor. She is doing fine otherwise. 09/16/2014. She is doing very well and the right side wounds have completely healed. She has finished 2 cycles of chemotherapy and she is 2 weeks on and 2 weeks off. He is to restart her chemotherapy this week. No fresh complaints pain is within control and there is no odor from the wounds. 10/21/2014 -- overall she is doing very well and is on a clinical trial with her medical oncologist. No issues with the wound on her left side and there is no odor or drainage. 11/25/2014 -- she continues to be under clinical trial protocol with the medical oncologist and has no fresh issues. 02/17/2015 -- she has been taken off the clinical trial protocol and now is on Taxol on a regular basis with her medical oncologists Dr. Susy Manor. 05/20/15; the patient follow see her for a nonhealing area on the left lateral chest. 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 She  has been using Aquacel Ag on this area on a palliative basis. No debridement has been done. 06/17/15; the patient follows in this clinic monthly for a nonhealing area on her left lateral chest. She has known metastatic breast cancer which is metastatic to the chest wall. She returns today with a cluster of open areas on the lateral aspect of her chest. The nurses in the clinic described this as a large open area that progressively closed over. I've taken this opportunity to review her oncology status. On her arrival here she asked me to debridement the wounds on her chest stating that this was a request of [name] her medical  oncologist. This is a patient who initially developed stage I right breast cancer treated with a modified radical mastectomy in 1998. She really presented with inflammatory breast cancer in 2014 post chemotherapy biopsy was positive she was not a surgical candidate. Since then she has been treated with multiple regimens. Most particular to her wounds PET scan in January 2016 revealed marked progression of the disease with multifocal masses in the chest wall left greater than right. 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 She has been receiving Faslodex. Weekly. She missed her last injection and is attributing this to the breakdown of tissue on her left chest wall. She was seen by [name] in early March however the record is not complete 07/16/15; patient arrives for her monthly visit the. I've reviewed her oncologist notes Dr. Nolon Stalls. Her notes for the increasing nodularity in the chest wall look. Her pain seems adequately controlled with TREVIA, NOP. ([id number]) tramadol. The measurements of the necrotic areas seem to be increasing. They are covered with a thick surface slough however I don't believe the debridement is indicated here. She is dressing knees with Aquacel Ag and foam Objective Constitutional Patient is hypotensive. She appears well. Pulse regular and within target range for patient.Marland Kitchen Respirations regular, non-labored and within target range.. Temperature is normal and within the target range for the patient.. Patient's appearance is neat and clean. Appears in no acute distress. Well nourished and well developed.. Vitals Time Taken: 10:00 AM, Height: 68 in, Weight: 180.5 lbs, BMI: 27.4, Temperature: 98.7 F, Pulse:  96 bpm, Respiratory Rate: 16 breaths/min, Blood Pressure: 99/75 mmHg. Lymphatic I don't feel any nodes in the left axilla however there is notes in the infraclavicular area on the left. No lymphadenopathy or glandular swelling noted in neck.. Psychiatric No evidence of depression, anxiety, or agitation. Calm, cooperative, and communicative. Appropriate interactions and affect.. General Notes: Wound exam; there is an expansion of the necrotic wound base on the left anterior chest multiple nodules which are likely malignant with thin the skin that is still present. She has a significant mass in the breast area. No debridement is indicated here, I am concerned that this is going to expand into a fairly extensive necrotic area. Integumentary (Hair, Skin) Multiple areas of open wounds across this unfortunate woman's left chest the. She also has multiple subcutaneous nodules which are likely malignant to.. Wound #1 status is Open. Original cause of wound was Other Lesion. The wound is located on the Left Chest. The wound measures 4.5cm length x 6cm width x 0.2cm depth; 21.206cm^2 area and 4.241cm^3 volume. The wound is limited to skin breakdown. There  is no tunneling or undermining noted. There is a large amount of serosanguineous drainage noted. The wound margin is distinct with the outline attached to the wound base. There is small (1-33%) pink, pale granulation within the wound bed. There is a large (67- 100%) amount of necrotic tissue within the wound bed including Adherent Slough. The periwound skin appearance had no abnormalities noted for moisture. The periwound skin appearance had no abnormalities noted for color. The periwound skin appearance exhibited: Induration, Scarring. The periwound skin appearance did not exhibit: Callus, Crepitus, Excoriation, Fluctuance, Friable, Localized Edema, Rash. Periwound temperature was noted as No Abnormality. [name] [name], [name] [name]. ([id number]) Wound #4  status is Open. Original cause of wound was Radiation Burn. The wound is located on the Left,Distal Chest. The wound measures 1cm length x 1.3cm width x 0.2cm depth; 1.021cm^2 area and 0.204cm^3 volume. The wound is limited to skin breakdown. There is no tunneling noted. There is a medium amount of serosanguineous drainage noted. The wound margin is fibrotic, thickened scar. There is medium (34-66%) pink, pale granulation within the wound bed. There is a medium (34-66%) amount of necrotic tissue within the wound bed including Adherent Slough. The periwound skin appearance exhibited: Moist. The periwound skin appearance did not exhibit: Callus, Crepitus, Excoriation, Fluctuance, Friable, Induration, Localized Edema, Rash, Scarring, Dry/Scaly, Maceration, Atrophie Blanche, Cyanosis, Ecchymosis, Hemosiderin Staining, Mottled, Pallor, Rubor, Erythema. Periwound temperature was noted as No Abnormality. Assessment Active Problems ICD-10 C50.812 - Malignant neoplasm of overlapping sites of left female breast S21.001A - Unspecified open wound of right breast, initial encounter C50.412 - Malignant neoplasm of upper-outer quadrant of left female breast S21.002A - Unspecified open wound of left breast, initial encounter Plan Wound Cleansing: Wound #1 Left Chest: Clean wound with Normal Saline. Wound #4 Left,Distal Chest: Clean wound with Normal Saline. Primary Wound Dressing: Wound #1 Left Chest: Aquacel Ag Wound #4 Left,Distal Chest: Aquacel Ag Secondary Dressing: Wound #1 Left Chest: Boardered Foam Dressing Wound #4 Left,Distal Chest: Boardered Foam Dressing Dressing Change Frequency: Wound #1 Left Chest: Change dressing every other day. - or more if needed Follow-up Appointments: [name] [name], [name] [name] ([id number]) Wound #1 Left Chest: Return Appointment in 1 month #1 the area appears to be deteriorating with new necrotic wounds and multiple nodules. In reviewing Dr. Danton Sewer notes it  would appear that there are limited options but she is to get a trial of him Imbrance and Faslodex. The case was discussed with a oncologist at [facility]. Presumably no role for further radiation. 2We'll continue to aid this patient and obtaining wound care supplies , I suspect the area may expand beyond of foam border cover 3 one-month follow-up Electronic Signature(s) Signed: 07/16/2015 7:58:31 AM By: Baltazar Najjar MD Entered By: Baltazar Najjar on 07/15/2015 10:27:29 [name] [name] ([id number]) -------------------------------------------------------------------------------- SuperBill Details Patient Name: [name] [name] Date of Service: 07/15/2015 Medical Record Patient Account Number: [id number] Number: Treating RN: Clover Mealy RN, BSN, Rita December 19, 1950 [phone] y.o. Other Clinician: Date of Birth/Sex: Female) Treating Daltyn Degroat Primary Care Physician/Extender: G PATIENT, NO Physician: Weeks in Treatment: 68 Referring Physician: Diagnosis Coding ICD-10 Codes Code Description C50.812 Malignant neoplasm of overlapping sites of left female breast S21.001A Unspecified open wound of right breast, initial encounter C50.412 Malignant neoplasm of upper-outer quadrant of left female breast S21.002A Unspecified open wound of left breast, initial encounter Facility Procedures CPT4 Code: [id number] Description: 99213 - WOUND CARE VISIT-LEV 3 EST PT Modifier: Quantity: 1 Physician Procedures CPT4 Code: [id number] Description: 99213 - WC PHYS LEVEL 3 -  EST PT ICD-10 Description Diagnosis S21.002A Unspecified open wound of left breast, initial e C50.812 Malignant neoplasm of overlapping sites of left Modifier: ncounter female breast Quantity: 1 Electronic Signature(s) Signed: 07/16/2015 7:58:31 AM By: Linton Ham MD Entered By: Linton Ham on 07/15/2015 10:28:18

## 2015-07-16 NOTE — Telephone Encounter (Signed)
Called pt and apologized to her for cancelling the faslodex injection on 4/4.  Mike Gip and myself had communication issues. She thought I need to r/s it but it was already r/s for 4/4 and she wanted her to have it. Set pt up for 4/6 and pt was ok with the appt.

## 2015-07-17 ENCOUNTER — Inpatient Hospital Stay: Payer: Medicare Other | Attending: Hematology and Oncology

## 2015-07-17 DIAGNOSIS — C50412 Malignant neoplasm of upper-outer quadrant of left female breast: Secondary | ICD-10-CM | POA: Diagnosis not present

## 2015-07-17 DIAGNOSIS — F17210 Nicotine dependence, cigarettes, uncomplicated: Secondary | ICD-10-CM | POA: Diagnosis not present

## 2015-07-17 DIAGNOSIS — C50112 Malignant neoplasm of central portion of left female breast: Secondary | ICD-10-CM

## 2015-07-17 DIAGNOSIS — Z171 Estrogen receptor negative status [ER-]: Secondary | ICD-10-CM | POA: Insufficient documentation

## 2015-07-17 DIAGNOSIS — Z853 Personal history of malignant neoplasm of breast: Secondary | ICD-10-CM | POA: Insufficient documentation

## 2015-07-17 DIAGNOSIS — Z170 Estrogen receptor positive status [ER+]: Secondary | ICD-10-CM | POA: Insufficient documentation

## 2015-07-17 DIAGNOSIS — Z79818 Long term (current) use of other agents affecting estrogen receptors and estrogen levels: Secondary | ICD-10-CM | POA: Insufficient documentation

## 2015-07-17 DIAGNOSIS — Z9221 Personal history of antineoplastic chemotherapy: Secondary | ICD-10-CM | POA: Insufficient documentation

## 2015-07-17 DIAGNOSIS — Z79899 Other long term (current) drug therapy: Secondary | ICD-10-CM | POA: Diagnosis not present

## 2015-07-17 DIAGNOSIS — Z9011 Acquired absence of right breast and nipple: Secondary | ICD-10-CM | POA: Diagnosis not present

## 2015-07-17 MED ORDER — FULVESTRANT 250 MG/5ML IM SOLN
500.0000 mg | INTRAMUSCULAR | Status: DC
  Administered 2015-07-17: 500 mg via INTRAMUSCULAR
  Filled 2015-07-17: qty 10

## 2015-07-22 ENCOUNTER — Telehealth: Payer: Self-pay | Admitting: *Deleted

## 2015-07-22 DIAGNOSIS — C50912 Malignant neoplasm of unspecified site of left female breast: Secondary | ICD-10-CM

## 2015-07-22 DIAGNOSIS — C50112 Malignant neoplasm of central portion of left female breast: Secondary | ICD-10-CM

## 2015-07-22 DIAGNOSIS — C792 Secondary malignant neoplasm of skin: Secondary | ICD-10-CM

## 2015-07-22 NOTE — Telephone Encounter (Signed)
Per Dr Mike Gip, come in for lab see md before starting med. Patient agrees to 4/14 appt at 1100

## 2015-07-22 NOTE — Telephone Encounter (Signed)
Called to say she received her med in the mail yesterday and wants to know when she is to start taking it

## 2015-07-25 ENCOUNTER — Inpatient Hospital Stay (HOSPITAL_BASED_OUTPATIENT_CLINIC_OR_DEPARTMENT_OTHER): Payer: Medicare Other | Admitting: Hematology and Oncology

## 2015-07-25 ENCOUNTER — Inpatient Hospital Stay: Payer: Medicare Other

## 2015-07-25 VITALS — BP 132/80 | HR 93 | Temp 98.7°F | Resp 16 | Wt 163.1 lb

## 2015-07-25 DIAGNOSIS — Z853 Personal history of malignant neoplasm of breast: Secondary | ICD-10-CM | POA: Diagnosis not present

## 2015-07-25 DIAGNOSIS — Z171 Estrogen receptor negative status [ER-]: Secondary | ICD-10-CM

## 2015-07-25 DIAGNOSIS — Z79899 Other long term (current) drug therapy: Secondary | ICD-10-CM

## 2015-07-25 DIAGNOSIS — F17210 Nicotine dependence, cigarettes, uncomplicated: Secondary | ICD-10-CM | POA: Diagnosis not present

## 2015-07-25 DIAGNOSIS — C50412 Malignant neoplasm of upper-outer quadrant of left female breast: Secondary | ICD-10-CM | POA: Diagnosis not present

## 2015-07-25 DIAGNOSIS — Z170 Estrogen receptor positive status [ER+]: Secondary | ICD-10-CM

## 2015-07-25 DIAGNOSIS — Z79818 Long term (current) use of other agents affecting estrogen receptors and estrogen levels: Secondary | ICD-10-CM

## 2015-07-25 DIAGNOSIS — Z9011 Acquired absence of right breast and nipple: Secondary | ICD-10-CM

## 2015-07-25 DIAGNOSIS — Z9221 Personal history of antineoplastic chemotherapy: Secondary | ICD-10-CM

## 2015-07-25 DIAGNOSIS — C50912 Malignant neoplasm of unspecified site of left female breast: Secondary | ICD-10-CM

## 2015-07-25 DIAGNOSIS — C792 Secondary malignant neoplasm of skin: Secondary | ICD-10-CM

## 2015-07-25 DIAGNOSIS — C50112 Malignant neoplasm of central portion of left female breast: Secondary | ICD-10-CM

## 2015-07-25 LAB — CBC WITH DIFFERENTIAL/PLATELET
Basophils Absolute: 0 10*3/uL (ref 0–0.1)
Basophils Relative: 1 %
Eosinophils Absolute: 0.1 10*3/uL (ref 0–0.7)
Eosinophils Relative: 1 %
HCT: 41.4 % (ref 35.0–47.0)
Hemoglobin: 14.1 g/dL (ref 12.0–16.0)
Lymphocytes Relative: 22 %
Lymphs Abs: 1.2 10*3/uL (ref 1.0–3.6)
MCH: 29 pg (ref 26.0–34.0)
MCHC: 34.1 g/dL (ref 32.0–36.0)
MCV: 84.8 fL (ref 80.0–100.0)
Monocytes Absolute: 0.7 10*3/uL (ref 0.2–0.9)
Monocytes Relative: 12 %
Neutro Abs: 3.4 10*3/uL (ref 1.4–6.5)
Neutrophils Relative %: 64 %
Platelets: 192 10*3/uL (ref 150–440)
RBC: 4.89 MIL/uL (ref 3.80–5.20)
RDW: 15.6 % — ABNORMAL HIGH (ref 11.5–14.5)
WBC: 5.3 10*3/uL (ref 3.6–11.0)

## 2015-07-25 LAB — COMPREHENSIVE METABOLIC PANEL
ALT: 18 U/L (ref 14–54)
AST: 28 U/L (ref 15–41)
Albumin: 4 g/dL (ref 3.5–5.0)
Alkaline Phosphatase: 104 U/L (ref 38–126)
Anion gap: 4 — ABNORMAL LOW (ref 5–15)
BUN: 16 mg/dL (ref 6–20)
CO2: 28 mmol/L (ref 22–32)
Calcium: 9.1 mg/dL (ref 8.9–10.3)
Chloride: 107 mmol/L (ref 101–111)
Creatinine, Ser: 0.93 mg/dL (ref 0.44–1.00)
GFR calc Af Amer: >60 mL/min (ref >60)
GFR calc non Af Amer: >60 mL/min (ref >60)
Glucose, Bld: 86 mg/dL (ref 65–99)
Potassium: 3.7 mmol/L (ref 3.5–5.1)
Sodium: 139 mmol/L (ref 135–145)
Total Bilirubin: 0.3 mg/dL (ref 0.3–1.2)
Total Protein: 7.8 g/dL (ref 6.5–8.1)

## 2015-07-25 LAB — MAGNESIUM: Magnesium: 2.2 mg/dL (ref 1.7–2.4)

## 2015-07-25 NOTE — Progress Notes (Signed)
Patient received [name] [name] and is here to see [name] before starting.

## 2015-07-25 NOTE — Progress Notes (Signed)
[facility] day:  07/25/2015  Chief Complaint: [name] [name] is a 65 y.o. female with recurrent breast cancer who is seen for review of interval CT scans and discussion regarding direction of therapy.  HPI: The patient was last seen in the medical oncology clinic on 07/11/2015.  At that time, restaging scans revealed progressive disease.  CA27.29 had increased.  She had increasing chest wall nodularity. Pain was controlled with Tramadol.  We discussed Foundation Once test results and proceeding with Ibrance and Faslodex if no available [facility] clinical trials.  Leslee Home was preauthorized and recently arrived.  She received Faslodex on 07/17/2015.  Symptomatically, she noted less chest tightness after this injection of Faslodex.  Lesions have been slightly softer.   Past Medical History  Diagnosis Date  . Personal history of malignant neoplasm of breast   . Anemia   . Blood transfusion without reported diagnosis   . Emphysema of lung ([place])   . History of left breast cancer , known active 2013    Past Surgical History  Procedure Laterality Date  . Oophrectomy  1980  . Abdominal hysterectomy  1995  . Breast surgery Right 1996    mastectomy  . Breast biopsy Left 2014  . Breast biopsy Left 01-21-14    Family History  Problem Relation Age of Onset  . Cancer Father   . Heart disease Sister   . Diabetes Sister   . Heart disease Brother   . Cancer Paternal Grandmother     breast    Social History:  reports that she has been smoking Cigarettes.  She has a 6.25 pack-year smoking history. She has never used smokeless tobacco. She reports that she does not drink alcohol or use illicit drugs.  She is alone today.  Allergies:  Allergies  Allergen Reactions  . Sulfa Antibiotics Nausea And Vomiting    headache    Current Medications: Current Outpatient Prescriptions  Medication Sig Dispense Refill  . Calcium Carbonate-Vitamin D (CALCIUM +  D PO) Take 2 tablets by mouth daily.    Marland Kitchen KLOR-CON 10 10 MEQ tablet TAKE 1 TABLET (10 MEQ TOTAL) BY MOUTH DAILY. 30 tablet 1  . LORazepam (ATIVAN) 0.5 MG tablet   0  . palbociclib (IBRANCE) 125 MG capsule Take 1 capsule (125 mg total) by mouth daily with breakfast. Take whole with food. 21 capsule 2  . traMADol (ULTRAM) 50 MG tablet Take 1 tablet (50 mg total) by mouth every 8 (eight) hours as needed. 90 tablet 0   No current facility-administered medications for this visit.   Facility-Administered Medications Ordered in Other Visits  Medication Dose Route Frequency Provider Last Rate Last Dose  . sodium chloride 0.9 % injection 10 mL  10 mL Intravenous PRN Lequita Asal, MD   10 mL at 10/08/14 1335  . sodium chloride 0.9 % injection 10 mL  10 mL Intracatheter PRN Lequita Asal, MD   10 mL at 11/26/14 1355    Review of Systems:  GENERAL:  Feels fine.  No fevers or sweats.  Weight down 1 pound. PERFORMANCE STATUS (ECOG):  1 HEENT:  No visual changes, runny nose, sore throat, mouth sores or tenderness. Lungs: No shortness of breath.  No cough.  No hemoptysis. Cardiac:  No chest pain, palpitations, orthopnea, or PND. GI:  Appetite 50%.  No nausea, vomiting, diarrhea, constipation, melena or hematochezia. GU:  No urgency, frequency, dysuria, or hematuria. Musculoskeletal:  No back pain.  No  joint pain.  No muscle tenderness. Extremities:  No pain or swelling. Skin:  Hair thin.  Chest wall lesions (see HPI).  No rashes or skin changes. 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  No headache, numbness or weakness, or coordination issues. Endocrine:  No diabetes, thyroid issues, hot flashes or night sweats. Psych:  No mood changes, depression or anxiety. Pain: No pain. Review of systems:  All other systems reviewed and found to be negative.   Physical Exam: Blood pressure 132/80, pulse 93, temperature 98.7 F (37.1 C), temperature source Oral, resp. rate 16, weight 163 lb 2.3 oz  (74 kg).  GENERAL: Well developed, well nourished, sitting comfortably in the exam room in no acute distress. MENTAL STATUS: Alert and oriented to person, place and time. HEAD: Short gray wig. Normocephalic, atraumatic, face symmetric, no Cushingoid features. EYES: Brown eyes. Pupils equal round and reactive to light and accomodation. No conjunctivitis or scleral icterus. ENT: Oropharynx clear without lesion. Tongue normal. Mucous membranes moist.  NECK: Left neck cyst (stable). BREAST: Right chest wall lesions healed. Central upper sternal area flat. Left 1.5 cm axillary nodule (stable). Chest wall thickened. Coalesced necrotic open lesions over anterior chest s/p debridement (6 cm superior border, 4 cm down, and 4 cm inferior border). Nipple on medial border.  Nodules (2 fingertip- 1.5 cm) in skin below left clavicle. Left lateral denuded triangular area (1 cm) plus similar sized hypopigmented area. SKIN: Cutaneous breast cancer noted in breast section. No other lesions.  EXTREMITIES: No edema, no skin discoloration or tenderness. No palpable cords. LYMPH NODES: Left axillary ridge. No palpable cervical, supraclavicular, axillary or inguinal adenopathy  NEUROLOGICAL: Unremarkable.  PSYCH: Appropriate.   Appointment on 07/25/2015  Component Date Value Ref Range Status  . WBC 07/25/2015 5.3  3.6 - 11.0 K/uL Final  . RBC 07/25/2015 4.89  3.80 - 5.20 MIL/uL Final  . Hemoglobin 07/25/2015 14.1  12.0 - 16.0 g/dL Final  . HCT 07/25/2015 41.4  35.0 - 47.0 % Final  . MCV 07/25/2015 84.8  80.0 - 100.0 fL Final  . MCH 07/25/2015 29.0  26.0 - 34.0 pg Final  . MCHC 07/25/2015 34.1  32.0 - 36.0 g/dL Final  . RDW 07/25/2015 15.6* 11.5 - 14.5 % Final  . Platelets 07/25/2015 192  150 - 440 K/uL Final  . Neutrophils Relative % 07/25/2015 64   Final  . Neutro Abs 07/25/2015 3.4  1.4 - 6.5 K/uL Final  . Lymphocytes Relative 07/25/2015 22   Final  . Lymphs Abs 07/25/2015 1.2  1.0 -  3.6 K/uL Final  . Monocytes Relative 07/25/2015 12   Final  . Monocytes Absolute 07/25/2015 0.7  0.2 - 0.9 K/uL Final  . Eosinophils Relative 07/25/2015 1   Final  . Eosinophils Absolute 07/25/2015 0.1  0 - 0.7 K/uL Final  . Basophils Relative 07/25/2015 1   Final  . Basophils Absolute 07/25/2015 0.0  0 - 0.1 K/uL Final  . Sodium 07/25/2015 139  135 - 145 mmol/L Final  . Potassium 07/25/2015 3.7  3.5 - 5.1 mmol/L Final  . Chloride 07/25/2015 107  101 - 111 mmol/L Final  . CO2 07/25/2015 28  22 - 32 mmol/L Final  . Glucose, Bld 07/25/2015 86  65 - 99 mg/dL Final  . BUN 07/25/2015 16  6 - 20 mg/dL Final  . Creatinine, Ser 07/25/2015 0.93  0.44 - 1.00 mg/dL Final  . Calcium 07/25/2015 9.1  8.9 - 10.3 mg/dL Final  . Total Protein 07/25/2015  7.8  6.5 - 8.1 g/dL Final  . Albumin 07/25/2015 4.0  3.5 - 5.0 g/dL Final  . AST 07/25/2015 28  15 - 41 U/L Final  . ALT 07/25/2015 18  14 - 54 U/L Final  . Alkaline Phosphatase 07/25/2015 104  38 - 126 U/L Final  . Total Bilirubin 07/25/2015 0.3  0.3 - 1.2 mg/dL Final  . GFR calc non Af Amer 07/25/2015 >60  >60 mL/min Final  . GFR calc Af Amer 07/25/2015 >60  >60 mL/min Final   Comment: (NOTE) The eGFR has been calculated using the CKD EPI equation. This calculation has not been validated in all clinical situations. eGFR's persistently <60 mL/min signify possible Chronic Kidney Disease.   . Anion gap 07/25/2015 4* 5 - 15 Final  . Magnesium 07/25/2015 2.2  1.7 - 2.4 mg/dL Final    Assessment:  AZIZAH LISLE is a 65 y.o. African American woman with a history of stage I right breast cancer s/p modified radical mastectomy on 06/12/1996. Pathology revealed a grade III mutifocal infiltrating ductal carcinoma (tumor size 8.5 cm with an invasive component 1.6 cm). Twenty-one lymph nodes were negative. Tumor was ER/PR negative. She received 4 cycles of AC at [facility].  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 Breast biopsy on  05/11/2012 revealed lobular carcinoma (ER/PR + and Her/2neu negative). PET scan on 05/22/2012 revealed multiple areas of disease. She received 6 cycles of Taxotere and Cytoxan (TC) from 06/06/2012 - 09/29/2012. PET scan on 09/07/2012 noted marked improvement. Post chemotherapy biopsy was positive. She was not a surgical candidate. She was placed on Femara.  PET scan on 05/03/2013 revealed a new focus along lateral left breast and a new level II cervical lymph node. She received radiation from 09/13/2013 - 11/05/2013. PET scan on 01/01/2014 revealed new nodule medial left chest wall pleural/paraspinal activity LUL. Multiple biopsies on 01/19/2014 were positive. She began daily letrozole and Ibrance on 02/12/2014. Despite treatment, new nodules formed. She received a total of 2 cycles (delays in therapy).   PET scan on 05/09/2014 revealed marked progression of disease with multifocal masses in chest wall (left > right). She did not receive Imbrance in 04/2014. CA27.29 has increased: 22.9 on 01/08/2014, 54 on 03/05/2014, 170.8 on 05/06/2014, 250.8 on 05/23/2014, 445.8 on 06/14/2014, and 531.1 on 07/02/2014.   She received 3 cycles of Xeloda (02/12 - 07/05/2014). Chest and abdomen CT scan on 07/05/2014 revealed persistent and slightly progressive diffuse locally invasive chest wall tumor. Bone scan on 07/25/2014 revealed no evidence of metastatic disease.   She received 8 cycles of [place] (08/06/2014 - 01/07/2015) on ACCRU [id number]. 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  Chest, abdomen, and pelvic CT scan on 10/24/2014 revealed improved multifocal inflammatory left breast cancer.  The dominant 2.6 x 3.5 cm lesion in the left upper outer left breast had decreased to 1.7 x 3.5 cm. The anterior sternal lesion has decreased from 2 x 4 cm to 1.5 x 3.9 cm. There were no suspicious mediastinal, hilar or axillary adenopathy.    Chest, abdomen, and pelvic CT scan on 01/20/2015  revealed inflammatory left breast cancer with new necrotic appearing subcutaneous nodules in the lateral left breast and lateral left chest wall.   CA27.29 was 339.8 on 08/06/2014, 51.7 on 10/15/2014, 37.6 on 12/10/2014, 26.4 on 01/07/2015, 34.1 on 01/28/2015, and 49.3 on 03/10/2015,  258.6 on 06/12/2015, 421.3 on 07/10/2015, and 726.3 on 07/25/2015.  She received 2 cycles of Taxol given 3 weeks  on/1 week off (02/10/2015 - 03/10/2015).  Chest and abdomen CT scan on 04/03/2015 revealed interval growth of superficial tumors in the lateral left breast and lateral left chest wall.  In the left lateral chest, disease had increased from 2.3 x 2 to 2.9 x 2.2 cm.  In the superficial lateral left chest wall, disease had increased from 1.7 x 1.2 cm to 2.7 x 1.8 cm.  There were stable findings of locally infiltrative tumor in the left chest wall and left axilla, including extensive left chest wall skin thickening, infiltrative soft tissue encasing the left axillary artery and focal pleural thickening in the anterior left mid pleural space.  There was no metastatic disease in the abdomen.  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  Estrogen receptor was > 90%, progesterone receptor was > 90%, and Her2/neu was 1+.    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 Treatment options of Faslodex (ESR1) and everolimus (approved) and temsirolimus (approved therapy for other tumor types).  She began Faslodex on 05/08/2015 (last 07/17/2015).   Chest, abdomen, and pelvic CT scans on 07/09/2015 revealed interval progression of disease in the left anterior and lateral chest wall.  There was interval development of nodular pleural thickening in the posterior medial left hemi thorax with some focal fluid  in the left major fissure. Findings raised concern for interval development of pleural involvement by tumor.  There was no evidence for metastatic disease in the abdomen   Symptomatically, she has increasing chest wall nodularity. Pain is controlled with Tramadol.  Plan: 1.  Labs today: CBC with diff, CMP, CA27.29, Mg. 2.  Begin Ibrance 125 mg po day 1-21 with 7 days off. 3.  Medical photographs today. 4.  RTC on 08/08/2015 for CBC with diff. 5.  RTC on 08/14/2015 for MD assess, labs (CBC with diff, CMP, CA27.29), and Faslodex.    MeLequita AsalMD  [phone]

## 2015-07-26 LAB — CANCER ANTIGEN 27.29: CA 27.29: 726.3 U/mL — ABNORMAL HIGH (ref 0.0–38.6)

## 2015-07-27 ENCOUNTER — Encounter: Payer: Self-pay | Admitting: Hematology and Oncology

## 2015-08-08 ENCOUNTER — Telehealth: Payer: Self-pay | Admitting: *Deleted

## 2015-08-08 ENCOUNTER — Inpatient Hospital Stay: Payer: Medicare Other

## 2015-08-08 DIAGNOSIS — C792 Secondary malignant neoplasm of skin: Secondary | ICD-10-CM

## 2015-08-08 DIAGNOSIS — C50912 Malignant neoplasm of unspecified site of left female breast: Secondary | ICD-10-CM

## 2015-08-08 DIAGNOSIS — C50412 Malignant neoplasm of upper-outer quadrant of left female breast: Secondary | ICD-10-CM | POA: Diagnosis not present

## 2015-08-08 LAB — CBC WITH DIFFERENTIAL/PLATELET
Basophils Absolute: 0 10*3/uL (ref 0–0.1)
Basophils Relative: 0 %
Eosinophils Absolute: 0 10*3/uL (ref 0–0.7)
Eosinophils Relative: 1 %
HCT: 39.9 % (ref 35.0–47.0)
Hemoglobin: 13.6 g/dL (ref 12.0–16.0)
Lymphocytes Relative: 40 %
Lymphs Abs: 1.4 10*3/uL (ref 1.0–3.6)
MCH: 28.9 pg (ref 26.0–34.0)
MCHC: 34.2 g/dL (ref 32.0–36.0)
MCV: 84.4 fL (ref 80.0–100.0)
Monocytes Absolute: 0.2 10*3/uL (ref 0.2–0.9)
Monocytes Relative: 6 %
Neutro Abs: 1.9 10*3/uL (ref 1.4–6.5)
Neutrophils Relative %: 53 %
Platelets: 173 10*3/uL (ref 150–440)
RBC: 4.72 MIL/uL (ref 3.80–5.20)
RDW: 15.2 % — ABNORMAL HIGH (ref 11.5–14.5)
WBC: 3.5 10*3/uL — ABNORMAL LOW (ref 3.6–11.0)

## 2015-08-08 MED ORDER — TRAMADOL HCL 50 MG PO TABS: 50.0000 mg | ORAL_TABLET | Freq: Three times a day (TID) | ORAL | Status: DC | PRN

## 2015-08-08 NOTE — Telephone Encounter (Signed)
Needs refill for tramadol and per corcoran ok to give new rx. Pt has it and she will go to pharmacy

## 2015-08-12 ENCOUNTER — Encounter: Payer: Medicare Other | Attending: Internal Medicine | Admitting: Internal Medicine

## 2015-08-12 DIAGNOSIS — I959 Hypotension, unspecified: Secondary | ICD-10-CM | POA: Insufficient documentation

## 2015-08-12 DIAGNOSIS — X58XXXA Exposure to other specified factors, initial encounter: Secondary | ICD-10-CM | POA: Diagnosis not present

## 2015-08-12 DIAGNOSIS — C50412 Malignant neoplasm of upper-outer quadrant of left female breast: Secondary | ICD-10-CM | POA: Diagnosis not present

## 2015-08-12 DIAGNOSIS — Z9221 Personal history of antineoplastic chemotherapy: Secondary | ICD-10-CM | POA: Diagnosis not present

## 2015-08-12 DIAGNOSIS — C50812 Malignant neoplasm of overlapping sites of left female breast: Secondary | ICD-10-CM | POA: Diagnosis not present

## 2015-08-12 DIAGNOSIS — S21001A Unspecified open wound of right breast, initial encounter: Secondary | ICD-10-CM | POA: Insufficient documentation

## 2015-08-12 DIAGNOSIS — S21002A Unspecified open wound of left breast, initial encounter: Secondary | ICD-10-CM | POA: Diagnosis not present

## 2015-08-13 ENCOUNTER — Other Ambulatory Visit
Admission: RE | Admit: 2015-08-13 | Discharge: 2015-08-13 | Disposition: A | Discharge status: Home / Self Care | Payer: Medicare Other | Source: Ambulatory Visit | Attending: Internal Medicine | Admitting: Internal Medicine

## 2015-08-13 DIAGNOSIS — S21002A Unspecified open wound of left breast, initial encounter: Secondary | ICD-10-CM | POA: Insufficient documentation

## 2015-08-13 NOTE — Progress Notes (Signed)
[name] [name], [name] [name] ([id number]) Visit Report for 08/12/2015 Chief Complaint Document Details Patient Name: [name] [name], [name] [name] Date of Service: 08/12/2015 10:00 AM Medical Record Patient Account Number: [id number] Number: Treating RN: Baruch Gouty, RN, BSN, Rita Jul 01, 1950 [phone] y.o. 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 07/18/2014 -- since last week the patient has had no complaints and is on her last cycle of chemotherapy. I have reviewed 155 pages of her reports from a medical oncologist and the summary is made in the history of present illness. Electronic Signature(s) Signed: 08/13/2015 8:00:31 AM By: Linton Ham MD Entered By: Linton Ham on 08/12/2015 10:20:37 [name] [name] ([id number]) -------------------------------------------------------------------------------- HPI Details Patient Name: [name] [name] Date of Service: 08/12/2015 10:00 AM Medical Record Patient Account Number: [id number] Number: Treating RN: Baruch Gouty, RN, BSN, Rita 21-Jul-1950 [phone] y.o. Other Clinician: Date of Birth/Sex: Female) Treating Keenan Dimitrov Primary Care Physician/Extender: G PATIENT, NO Physician: Referring Physician: Weeks in Treatment: 22 History of Present Illness HPI Description: this 65 year old patient has come by herself today as a self-referral and has no documentation with her. She has had open wounds to her left chest and the right chest wall for about 2 months. She is not a diabetic and has no significant medical history except breast cancer. Her right breast cancer  was diagnosed 18 years ago and she had a mastectomy and chemotherapy. 3 years ago she was then diagnosed with a breast cancer of the left side which was advanced and could not be operated and was given chemotherapy for a year and then followed by radiation. Her last radiation was over a year ago. She has not had any biopsy done recently from the ulcerated area but she says there have been other biopsies done from the chest wall and these reports are not available at the present time. She was told to keep the wounds open and let them dry out but she is finding it's more and more difficult to stop soiling her clothes if she leaves wounds open. She is on chemotherapy and we will try and obtain these notes from her oncologist. She is not in pain and does not have any significant problems except the discomfort from an open wound on the chest wall. 07/18/2014 This is a summary of [name] [name] date of birth Dec 03, 1950. These are obtained by reviewing 151 pages of medical records which were obtained from the [facility] 65 year old patient who had a history of stage I right breast cancer status post modified radical mastectomy on 06/12/1996. 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 She received 4 cycles of AC at [facility]. In January 2014 she presented with left breast swelling and apparently cellulitis. Breast biopsy done revealed lobular carcinoma which was ER and PR positive and HER-2/neu negative. On PET scan and she was shown to have multiple areas of disease left breast, axilla and supraclavicular areas. She received 6 cycles of chemotherapy and then received Femara because she was not a surgical candidate. PET scan also revealed disease in the left lateral breast and new cervical lymph nodes at level II. The patient  was then sent for radiation therapy and received this over a month. In January 2016 she had a PET CT scan done  which was compatible with marked progression of disease with multifocal soft tissue masses in the chest wall left greater than right. She is currently receiving Xeloda and is noted to have disease in the chest wall which is widely present. Her most recent lab work from 07/02/2014 shows that her CMP is within normal limits with a serum albumin of 4.4. The previous CBC was within normal limits with a WBC count of 4.3 hemoglobin of 14 hematocrit of 40.9 and platelets of 202. 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 We will continue with palliative care and make her comfortable as much as possible as far as her wound care goes. Present time there is no odor but if this does occur we would use carbon- [name] [name], [name] ([id number]) based products to mask the odor. Notes were also reviewed from her surgeon Dr. Hervey Ard --closure on 05/27/2014 for breast biopsy in view of the fact that she had extensive disease on her chest wall. Biopsies are taken from the nodular metastatic disease overlying the sternum and nodules chosen for biopsy. Punch biopsies were taken and the pathology on this confirmed that this was consistent with carcinoma 08/15/2014 -- she is started on a new course of injectable chemotherapy and this is 2 weeks on and 2 weeks off. She has no new symptoms no bleeding from the wounds nor is there any odor. She is doing fine otherwise. 09/16/2014. She is doing very well and the right side wounds have completely healed. She has finished 2 cycles of chemotherapy and she is 2 weeks on and 2 weeks off. He is to restart her chemotherapy this week. No fresh complaints pain is within control and there is no odor from the wounds. 10/21/2014 -- overall she is doing very well and is on a  clinical trial with her medical oncologist. No issues with the wound on her left side and there is no odor or drainage. 11/25/2014 -- she continues to be under clinical trial protocol with the medical oncologist and has no fresh issues. 02/17/2015 -- she has been taken off the clinical trial protocol and now is on Taxol on a regular basis with her medical oncologists Dr. Susy Manor. 05/20/15; the patient follow see her for a nonhealing area on the left lateral chest. 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 She has been using Aquacel Ag on this area on a palliative basis. No debridement has been done. 06/17/15; the patient follows in this clinic monthly for a nonhealing area on her left lateral chest. She has known metastatic breast cancer which is metastatic to the chest wall. She returns today with a cluster of open areas on the lateral aspect of her chest. The nurses in the clinic described this as a large open area that progressively closed over. I've taken this opportunity to review her oncology status. On her arrival here she asked me to debridement the wounds on her chest stating that this was a request of [name] her medical oncologist. This is a patient who initially developed stage I right breast cancer treated with a modified radical mastectomy in 1998.  She really presented with inflammatory breast cancer in 2014 post chemotherapy biopsy was positive she was not a surgical candidate. Since then she has been treated with multiple regimens. Most particular to her wounds PET scan in January 2016 revealed marked progression of the disease with multifocal masses in the chest wall left greater than right. 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 Left chest wall biopsy on 04/29/15 revealed  metastatic carcinoma consistent with invasive lobular carcinoma. She has been receiving Faslodex. Weekly. She missed her last injection and is attributing this to the breakdown of tissue on her left chest wall. She was seen by Dr. Merlene Pulling in early March however the record is not complete 07/16/15; patient arrives for her monthly visit the. I've reviewed her oncologist notes Dr. Nelva Nay. Her notes for the increasing nodularity in the chest wall look. Her pain seems adequately controlled with tramadol. The measurements of the necrotic areas seem to be increasing. They are covered with a thick surface slough however I don't believe the debridement is indicated here. She is dressing knees with Aquacel Ag and foam 08/12/15; this is a patient I follow monthly. Her primary oncologist is Dr. Merlene Pulling of the local oncology clinic. The patient lives in [place]. Unfortunately she has expanding necrotic mass in her left anterior chest which is no doubt malignant wounds from her known metastatic breast cancer. She also has a lot of tenderness on the lateral aspect of this wound which might be cellulitis that she appears to have some form of drainage. I have done a culture although this also could be from expanding necrotic tumor [name] [name], [name] [name] ([id number]) Electronic Signature(s) Signed: 08/13/2015 8:00:31 AM By: Baltazar Najjar MD Entered By: Baltazar Najjar on 08/12/2015 10:22:30 [name] [name] ([id number]) -------------------------------------------------------------------------------- Physical Exam Details Patient Name: [name] [name] Date of Service: 08/12/2015 10:00 AM Medical Record Patient Account Number: [id number] Number: Treating RN: Clover Mealy, RN, BSN, Rita Oct 27, 1950 [phone] y.o. Other Clinician: Date of Birth/Sex: Female) Treating Kalese Ensz Primary Care Physician/Extender: G PATIENT, NO Physician: Referring Physician: Weeks in Treatment:  41 Constitutional Sitting or standing Blood Pressure is within target range for patient.. Pulse regular and within target range for patient.Marland Kitchen Respirations regular, non-labored and within target range.. Temperature is normal and within the target range for the patient.Marland Kitchen Respiratory Respiratory effort is easy and symmetric bilaterally. Rate is normal at rest and on room air.. Bilateral breath sounds are clear and equal in all lobes with no wheezes, rales or rhonchi.. Cardiovascular Heart rhythm and rate regular, without murmur or gallop. There is no rub. Gastrointestinal (GI) No liver or spleen enlargement or tenderness.Marland Kitchen Psychiatric Patient appears more fatigued and perhaps somewhat depressed. Notes Wound exam; there is expanding necrotic wound across the left anterior chest worse than when I saw her a month ago she has multiple nodules which are likely malignant nodules. A lot of tenderness on the lateral aspect of the wound which some necrotic drainage Electronic Signature(s) Signed: 08/13/2015 8:00:31 AM By: Baltazar Najjar MD Entered By: Baltazar Najjar on 08/12/2015 10:27:03 [name] [name] ([id number]) -------------------------------------------------------------------------------- Physician Orders Details Patient Name: [name] [name] Date of Service: 08/12/2015 10:00 AM Medical Record Patient Account Number: [id number] Number: Treating RN: Clover Mealy, RN, BSN, Rita 03-26-1951 [phone] y.o. Other Clinician: Date of Birth/Sex: Female) Treating Cosimo Schertzer Primary Care Physician/Extender: G PATIENT, NO Physician: Referring Physician: Weeks in Treatment: 64  Verbal / Phone Orders: Yes Clinician: Afful, RN, BSN, Velva Harman Read Back and Verified: Yes Diagnosis Coding Wound Cleansing Wound #1 Left Chest o Cleanse wound with mild soap and water o May Shower, gently pat wound dry prior to applying new dressing. o May shower with protection. Wound #4 Left,Distal  Chest o Cleanse wound with mild soap and water o May Shower, gently pat wound dry prior to applying new dressing. o May shower with protection. Primary Wound Dressing Wound #1 Left Chest o Aquacel Ag Wound #4 Left,Distal Chest o Aquacel Ag Secondary Dressing Wound #1 Left Chest o Boardered Foam Dressing Wound #4 Left,Distal Chest o Boardered Foam Dressing Dressing Change Frequency Wound #1 Left Chest o Change dressing every other day. - or more if needed Wound #4 Left,Distal Chest o Change dressing every other day. - or more if needed Follow-up Appointments [name] [name], [name] [name]. ([id number]) Wound #1 Left Chest o Return Appointment in 1 week. Wound #4 Left,Distal Chest o Return Appointment in 1 week. Laboratory o Bacteria identified in Wound by Culture (MICRO) oooo LOINC Code: [id number] oooo Convenience Name: Wound culture routine Electronic Signature(s) Signed: 08/12/2015 5:12:30 PM By: Regan Lemming BSN, RN Signed: 08/13/2015 8:00:31 AM By: Linton Ham MD Entered By: Regan Lemming on 08/12/2015 10:23:51 [name] [name] ([id number]) -------------------------------------------------------------------------------- Problem List Details Patient Name: [name] [name] Date of Service: 08/12/2015 10:00 AM Medical Record Patient Account Number: [id number] Number: Treating RN: Baruch Gouty, RN, BSN, Rita 07-28-1950 [phone] y.o. Other Clinician: Date of Birth/Sex: Female) Treating Ernestene Coover Primary Care Physician/Extender: G PATIENT, NO Physician: Referring Physician: Weeks in Treatment: 26 Active Problems ICD-10 Encounter Code Description Active Date Diagnosis C50.812 Malignant neoplasm of overlapping sites of left female 07/11/2014 Yes breast S21.001A Unspecified open wound of right breast, initial encounter 07/11/2014 Yes C50.412 Malignant neoplasm of upper-outer quadrant of left female 07/11/2014 Yes breast S21.002A Unspecified open wound of left  breast, initial encounter 07/11/2014 Yes Inactive Problems Resolved Problems Electronic Signature(s) Signed: 08/13/2015 8:00:31 AM By: Linton Ham MD Entered By: Linton Ham on 08/12/2015 10:20:09 [name] [name] ([id number]) -------------------------------------------------------------------------------- Progress Note Details Patient Name: [name] [name] Date of Service: 08/12/2015 10:00 AM Medical Record Patient Account Number: [id number] Number: Treating RN: Baruch Gouty, RN, BSN, Rita [birth year] [phone] y.o. 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 07/18/2014 -- since last week the patient has had no complaints and is on her last cycle of chemotherapy. I have reviewed 155 pages of her reports from a medical oncologist and the summary is made in the history of present illness. History of Present Illness (HPI) this 65 year old patient has come by herself today as a self-referral and has no documentation with her. She has had open wounds to her left chest and the right chest wall for about 2 months. She is not a diabetic and has no significant medical history except breast cancer. Her right breast cancer was diagnosed 18 years ago and she had a mastectomy and chemotherapy. 3 years ago she was then diagnosed with a breast cancer of the left side which was advanced and could not be operated and was given chemotherapy for a year and  then followed by radiation. Her last radiation was over a year ago. She has not had any biopsy done recently from the ulcerated area but she says  there have been other biopsies done from the chest wall and these reports are not available at the present time. She was told to keep the wounds open and let them dry out but she is finding it's more and more difficult to stop soiling her clothes if she leaves wounds open. She is on chemotherapy and we will try and obtain these notes from her oncologist. She is not in pain and does not have any significant problems except the discomfort from an open wound on the chest wall. 07/18/2014 This is a summary of [name] [name] date of birth 07-16-1950. These are obtained by reviewing 151 pages of medical records which were obtained from the [facility] 65 year old patient who had a history of stage I right breast cancer status post modified radical mastectomy on 06/12/1996. 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 She received 4 cycles of AC at [facility]. In January 2014 she presented with left breast swelling and apparently cellulitis. Breast biopsy done revealed lobular carcinoma which was ER and PR positive and HER-2/neu negative. On PET scan and she was shown to have multiple areas of disease left breast, axilla and supraclavicular areas. She received 6 cycles of chemotherapy and then received Femara because she was not a surgical candidate. PET scan also revealed disease in the left lateral breast and new cervical lymph nodes at level II. The patient was then sent for radiation therapy and received this over a month. In January 2016 she had a PET CT scan done which was compatible with marked progression of disease Gorgas, Tymesha W. ([id number]) with multifocal soft tissue masses in the chest wall left greater than right. She is currently receiving Xeloda and is noted to have disease in the chest wall which is widely present. Her most recent lab work from 07/02/2014 shows that her CMP is within normal limits with a serum albumin of  4.4. The previous CBC was within normal limits with a WBC count of 4.3 hemoglobin of 14 hematocrit of 40.9 and platelets of 202. 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 We will continue with palliative care and make her comfortable as much as possible as far as her wound care goes. Present time there is no odor but if this does occur we would use carbon- based products to mask the odor. Notes were also reviewed from her surgeon Dr. Hervey Ard --closure on 05/27/2014 for breast biopsy in view of the fact that she had extensive disease on her chest wall. Biopsies are taken from the nodular metastatic disease overlying the sternum and nodules chosen for biopsy. Punch biopsies were taken and the pathology on this confirmed that this was consistent with carcinoma 08/15/2014 -- she is started on a new course of injectable chemotherapy and this is 2 weeks on and 2 weeks off. She has no new symptoms no bleeding from the wounds nor is there any odor. She is doing fine otherwise. 09/16/2014. She is doing very well and the right side wounds have completely healed. She has finished 2 cycles of chemotherapy and she is 2 weeks on and  2 weeks off. He is to restart her chemotherapy this week. No fresh complaints pain is within control and there is no odor from the wounds. 10/21/2014 -- overall she is doing very well and is on a clinical trial with her medical oncologist. No issues with the wound on her left side and there is no odor or drainage. 11/25/2014 -- she continues to be under clinical trial protocol with the medical oncologist and has no fresh issues. 02/17/2015 -- she has been taken off the clinical trial protocol and now is on Taxol on a regular basis with her medical oncologists Dr.  Susy Manor. 05/20/15; the patient follow see her for a nonhealing area on the left lateral chest. 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 She has been using Aquacel Ag on this area on a palliative basis. No debridement has been done. 06/17/15; the patient follows in this clinic monthly for a nonhealing area on her left lateral chest. She has known metastatic breast cancer which is metastatic to the chest wall. She returns today with a cluster of open areas on the lateral aspect of her chest. The nurses in the clinic described this as a large open area that progressively closed over. I've taken this opportunity to review her oncology status. On her arrival here she asked me to debridement the wounds on her chest stating that this was a request of [name] her medical oncologist. This is a patient who initially developed stage I right breast cancer treated with a modified radical mastectomy in 1998. She really presented with inflammatory breast cancer in 2014 post chemotherapy biopsy was positive she was not a surgical candidate. Since then she has been treated with multiple regimens. Most particular to her wounds PET scan in January 2016 revealed marked progression of the disease with multifocal masses in the chest wall left greater than right. 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 She has been receiving Faslodex. Weekly. She missed her last injection and is attributing this to the breakdown of tissue on her left chest wall. She was seen by [name] in early March however the record is not complete 07/16/15; patient arrives for her monthly visit the. I've reviewed her oncologist notes Dr.  Nolon Stalls. Her notes for the increasing nodularity in the chest wall look. Her pain seems adequately controlled with DEBRINA, [name] [name]. ([id number]) tramadol. The measurements of the necrotic areas seem to be increasing. They are covered with a thick surface slough however I don't believe the debridement is indicated here. She is dressing knees with Aquacel Ag and foam 08/12/15; this is a patient I follow monthly. Her primary oncologist is [name] of the local oncology clinic. The patient lives in [place]. Unfortunately she has expanding necrotic mass in her left anterior chest which is no doubt malignant wounds from her known metastatic breast cancer. She also has a lot of tenderness on the lateral aspect of this wound which might be cellulitis that she appears to have some form of drainage. I have done a culture although this also could be from expanding necrotic tumor Objective Constitutional Sitting or standing Blood Pressure is within target range for patient.. Pulse regular and within target range for patient.Marland Kitchen Respirations regular, non-labored and within  target range.. Temperature is normal and within the target range for the patient.. Vitals Time Taken: 9:58 AM, Height: 68 in, Weight: 180.5 lbs, BMI: 27.4, Temperature: 97.9 F, Pulse: 96 bpm, Respiratory Rate: 17 breaths/min, Blood Pressure: 122/76 mmHg. Respiratory Respiratory effort is easy and symmetric bilaterally. Rate is normal at rest and on room air.. Bilateral breath sounds are clear and equal in all lobes with no wheezes, rales or rhonchi.. Cardiovascular Heart rhythm and rate regular, without murmur or gallop. There is no rub. Gastrointestinal (GI) No liver or spleen enlargement or tenderness.. General Notes: Wound exam; there is expanding necrotic wound across the left anterior chest worse than when I saw her a month ago she has multiple nodules which are likely malignant nodules. A lot of tenderness on the  lateral aspect of the wound which some necrotic drainage Integumentary (Hair, Skin) Wound #1 status is Open. Original cause of wound was Other Lesion. The wound is located on the Left Chest. The wound measures 5.5cm length x 7cm width x 0.2cm depth; 30.238cm^2 area and 6.048cm^3 volume. The wound is limited to skin breakdown. There is no tunneling or undermining noted. There is a large amount of serosanguineous drainage noted. The wound margin is distinct with the outline attached to the wound base. There is small (1-33%) pink, pale granulation within the wound bed. There is a large (67- 100%) amount of necrotic tissue within the wound bed including Adherent Slough. The periwound skin appearance had no abnormalities noted for moisture. The periwound skin appearance had no abnormalities noted for color. The periwound skin appearance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

## 2015-08-13 NOTE — Progress Notes (Signed)
[name], [name] ([id number]) Visit Report for 08/12/2015 Arrival Information Details Patient Name: [name] [name], [name] Date of Service: 08/12/2015 10:00 AM Medical Record Patient Account Number: [id number] Number: Treating RN: Baruch Gouty, RN, BSN, Rita [birth year] [phone] y.o. Other Clinician: Date of Birth/Sex: Female) Treating ROBSON, MICHAEL Primary Care Physician/Extender: G PATIENT, NO Physician: Referring Physician: Weeks in Treatment: 59 Visit Information History Since Last Visit Added or deleted any medications: No Patient Arrived: Ambulatory Any new allergies or adverse reactions: No Arrival Time: 09:58 Had a fall or experienced change in No Accompanied By: self activities of daily living that may affect Transfer Assistance: None risk of falls: Patient Identification Verified: Yes Signs or symptoms of abuse/neglect since last No Secondary Verification Process Yes visito Completed: Has Dressing in Place as Prescribed: Yes Patient Requires Transmission-Based No Pain Present Now: No Precautions: Patient Has Alerts: No Electronic Signature(s) Signed: 08/12/2015 5:12:30 PM By: Regan Lemming BSN, RN Entered By: Regan Lemming on 08/12/2015 09:58:38 [name] [name] [name] ([id number]) -------------------------------------------------------------------------------- Clinic Level of Care Assessment Details Patient Name: [name] [name] [name] Date of Service: 08/12/2015 10:00 AM Medical Record Patient Account Number: [id number] Number: Treating RN: Baruch Gouty, RN, BSN, Rita [birth year] [phone] y.o. Other Clinician: Date of Birth/Sex: Female) Treating ROBSON, MICHAEL Primary Care Physician/Extender: G PATIENT, NO Physician: Referring Physician: Weeks in Treatment: 37 Clinic Level of Care Assessment Items TOOL 4 Quantity Score []  - Use when only an EandM is performed on FOLLOW-UP visit 0 ASSESSMENTS - Nursing Assessment / Reassessment X - Reassessment of Co-morbidities (includes  updates in patient status) 1 10 X - Reassessment of Adherence to Treatment Plan 1 5 ASSESSMENTS - Wound and Skin Assessment / Reassessment []  - Simple Wound Assessment / Reassessment - one wound 0 X - Complex Wound Assessment / Reassessment - multiple wounds 2 5 []  - Dermatologic / Skin Assessment (not related to wound area) 0 ASSESSMENTS - Focused Assessment []  - Circumferential Edema Measurements - multi extremities 0 []  - Nutritional Assessment / Counseling / Intervention 0 []  - Lower Extremity Assessment (monofilament, tuning fork, pulses) 0 []  - Peripheral Arterial Disease Assessment (using hand held doppler) 0 ASSESSMENTS - Ostomy and/or Continence Assessment and Care []  - Incontinence Assessment and Management 0 []  - Ostomy Care Assessment and Management (repouching, etc.) 0 PROCESS - Coordination of Care X - Simple Patient / Family Education for ongoing care 1 15 []  - Complex (extensive) Patient / Family Education for ongoing care 0 []  - Staff obtains Consents, Records, Test Results / Process Orders 0 [name] [name] [name], [name] [name] [name] ([id number]) []  - Staff telephones HHA, Nursing Homes / Clarify orders / etc 0 []  - Routine Transfer to another Facility (non-emergent condition) 0 []  - Routine Hospital Admission (non-emergent condition) 0 []  - New Admissions / Biomedical engineer / Ordering NPWT, Apligraf, etc. 0 []  - Emergency Hospital Admission (emergent condition) 0 []  - Simple Discharge Coordination 0 []  - Complex (extensive) Discharge Coordination 0 PROCESS - Special Needs []  - Pediatric / Minor Patient Management 0 []  - Isolation Patient Management 0 []  - Hearing / Language / Visual special needs 0 []  - Assessment of Community assistance (transportation, D/C planning, etc.) 0 []  - Additional assistance / Altered mentation 0 []  - Support Surface(s) Assessment (bed, cushion, seat, etc.) 0 INTERVENTIONS - Wound Cleansing / Measurement []  - Simple Wound Cleansing - one wound 0 X -  Complex Wound Cleansing - multiple wounds 2 5 X - Wound Imaging (photographs - any number of wounds) 1 5 []  - Wound Tracing (instead  of photographs) 0 []  - Simple Wound Measurement - one wound 0 X - Complex Wound Measurement - multiple wounds 2 5 INTERVENTIONS - Wound Dressings X - Small Wound Dressing one or multiple wounds 2 10 []  - Medium Wound Dressing one or multiple wounds 0 []  - Large Wound Dressing one or multiple wounds 0 []  - Application of Medications - topical 0 []  - Application of Medications - injection 0 [name] [name] [name], [name] [name] W. ([id number]) INTERVENTIONS - Miscellaneous []  - External ear exam 0 X - Specimen Collection (cultures, biopsies, blood, body fluids, etc.) 1 5 X - Specimen(s) / Culture(s) sent or taken to Lab for analysis 1 5 []  - Patient Transfer (multiple staff / Harrel Lemon Lift / Similar devices) 0 []  - Simple Staple / Suture removal (25 or less) 0 []  - Complex Staple / Suture removal (26 or more) 0 []  - Hypo / Hyperglycemic Management (close monitor of Blood Glucose) 0 []  - Ankle / Brachial Index (ABI) - do not check if billed separately 0 X - Vital Signs 1 5 Has the patient been seen at the hospital within the last three years: Yes Total Score: 100 Level Of Care: New/Established - Level 3 Electronic Signature(s) Signed: 08/12/2015 5:12:30 PM By: Regan Lemming BSN, RN Entered By: Regan Lemming on 08/12/2015 10:24:53 [name] [name] [name] ([id number]) -------------------------------------------------------------------------------- Encounter Discharge Information Details Patient Name: [name] [name] [name] Date of Service: 08/12/2015 10:00 AM Medical Record Patient Account Number: [id number] Number: Treating RN: Baruch Gouty, RN, BSN, Rita 06/10/1950 [phone] y.o. Other Clinician: Date of Birth/Sex: Female) Treating ROBSON, MICHAEL Primary Care Physician/Extender: G PATIENT, NO Physician: Referring Physician: Weeks in Treatment: 74 Encounter Discharge Information  Items Discharge Pain Level: 0 Discharge Condition: Stable Ambulatory Status: Ambulatory Discharge Destination: Home Transportation: Private Auto Accompanied By: self Schedule Follow-up Appointment: No Medication Reconciliation completed No and provided to Patient/Care [name] [name] [name]: Patient Clinical Summary of Care: Declined Electronic Signature(s) Signed: 08/12/2015 5:12:30 PM By: Regan Lemming BSN, RN Previous Signature: 08/12/2015 10:23:43 AM Version By: Ruthine Dose Entered By: Regan Lemming on 08/12/2015 10:25:39 [name] [name] [name] ([id number]) -------------------------------------------------------------------------------- Multi Wound Chart Details Patient Name: [name] [name] [name] Date of Service: 08/12/2015 10:00 AM Medical Record Patient Account Number: [id number] Number: Treating RN: Baruch Gouty, RN, BSN, Rita 01-24-1951 [phone] y.o. Other Clinician: Date of Birth/Sex: Female) Treating ROBSON, MICHAEL Primary Care Physician/Extender: G PATIENT, NO Physician: Referring Physician: Weeks in Treatment: 56 Vital Signs Height(in): 68 Pulse(bpm): 96 Weight(lbs): 180.5 Blood Pressure 122/76 (mmHg): Body Mass Index(BMI): 27 Temperature(F): 97.9 Respiratory Rate 17 (breaths/min): Photos: [1:No Photos] [4:No Photos] [N/A:N/A] Wound Location: [1:Left Chest] [4:Left Chest - Distal] [N/A:N/A] Wounding Event: [1:Other Lesion] [4:Radiation Burn] [N/A:N/A] Primary Etiology: [1:Malignant Wound] [4:Atypical] [N/A:N/A] Comorbid History: [1:Received Chemotherapy, Received Radiation] [4:Received Chemotherapy, Received Radiation] [N/A:N/A] Date Acquired: [1:05/10/2014] [4:07/13/2015] [N/A:N/A] Weeks of Treatment: [1:56] [4:4] [N/A:N/A] Wound Status: [1:Open] [4:Open] [N/A:N/A] Measurements L x W x D 5.5x7x0.2 [4:1x1x0.1] [N/A:N/A] (cm) Area (cm) : [1:30.238] [4:0.785] [N/A:N/A] Volume (cm) : [1:6.048] [4:0.079] [N/A:N/A] % Reduction in Area: [1:-28.30%] [4:23.10%] [N/A:N/A] % Reduction  in Volume: -28.40% [4:61.30%] [N/A:N/A] Classification: [1:Full Thickness Without Exposed Support Structures] [4:Full Thickness Without Exposed Support Structures] [N/A:N/A] Exudate Amount: [1:Large] [4:Medium] [N/A:N/A] Exudate Type: [1:Serosanguineous] [4:Serosanguineous] [N/A:N/A] Exudate Color: [1:red, brown] [4:red, brown] [N/A:N/A] Wound Margin: [1:Distinct, outline attached] [4:Fibrotic scar, thickened scar] [N/A:N/A] Granulation Amount: [1:Small (1-33%)] [4:Large (67-100%)] [N/A:N/A] Granulation Quality: [1:Pink, Pale] [4:Pink, Pale] [N/A:N/A] Necrotic Amount: [1:Large (67-100%)] [4:Small (1-33%)] [N/A:N/A] Exposed Structures: [N/A:N/A] Fascia: No Fascia: No Fat: No Fat: No Tendon: No Tendon:  No Muscle: No Muscle: No Joint: No Joint: No Bone: No Bone: No Limited to Skin Limited to Skin Breakdown Breakdown Epithelialization: None None N/A Periwound Skin Texture: Induration: Yes Edema: No N/A Scarring: Yes Excoriation: No Edema: No Induration: No Excoriation: No Callus: No Callus: No Crepitus: No Crepitus: No Fluctuance: No Fluctuance: No Friable: No Friable: No Rash: No Rash: No Scarring: No Periwound Skin No Abnormalities Noted Moist: Yes N/A Moisture: Maceration: No Dry/Scaly: No Periwound Skin Color: No Abnormalities Noted Atrophie Blanche: No N/A Cyanosis: No Ecchymosis: No Erythema: No Hemosiderin Staining: No Mottled: No Pallor: No Rubor: No Temperature: No Abnormality No Abnormality N/A Tenderness on Yes Yes N/A Palpation: Wound Preparation: Ulcer Cleansing: Ulcer Cleansing: N/A Rinsed/Irrigated with Rinsed/Irrigated with Saline Saline Topical Anesthetic Topical Anesthetic Applied: Other: lidocaine Applied: Other: lidocaine 4% 4% Treatment Notes Electronic Signature(s) Signed: 08/12/2015 5:12:30 PM By: Regan Lemming BSN, RN Entered By: Regan Lemming on 08/12/2015 10:16:21 [name] [name] [name]  ([id number]) -------------------------------------------------------------------------------- [place] Details Patient Name: [name] [name] [name] Date of Service: 08/12/2015 10:00 AM Medical Record Patient Account Number: [id number] Number: Treating RN: Baruch Gouty, RN, BSN, Rita 18-May-1950 [phone] y.o. Other Clinician: Date of Birth/Sex: Female) Treating ROBSON, MICHAEL Primary Care Physician/Extender: G PATIENT, NO Physician: Referring Physician: Weeks in Treatment: 13 Active Inactive Abuse / Safety / Falls / Self Care Management Nursing Diagnoses: Potential for falls Goals: Patient will remain injury free Date Initiated: 07/11/2014 Goal Status: Active Interventions: Assess fall risk on admission and as needed Notes: Malignancy/Atypical Etiology Nursing Diagnoses: Knowledge deficit related to disease process and management of atypical ulcer etiology Goals: Patient/caregiver will verbalize understanding of disease process and disease management of atypical ulcer etiology Date Initiated: 07/11/2014 Goal Status: Active Interventions: Provide education on atypical ulcer etiologies Notes: Nutrition Nursing Diagnoses: Potential for alteratiion in Nutrition/Potential for imbalanced nutrition [name] [name] [name], OVERMIER. ([id number]) Goals: Patient/caregiver agrees to and verbalizes understanding of need to use nutritional supplements and/or vitamins as prescribed Date Initiated: 07/11/2014 Goal Status: Active Interventions: Assess patient nutrition upon admission and as needed per policy Notes: Orientation to the Wound Care Program Nursing Diagnoses: Knowledge deficit related to the wound healing center program Goals: Patient/caregiver will verbalize understanding of the [facility] Date Initiated: 07/11/2014 Goal Status: Active Interventions: Provide education on orientation to the wound center Notes: Wound/Skin Impairment Nursing  Diagnoses: Impaired tissue integrity Goals: Patient will demonstrate a reduced rate of smoking or cessation of smoking Date Initiated: 07/11/2014 Goal Status: Active Patient will have a decrease in wound volume by X% from date: (specify in notes) Date Initiated: 07/11/2014 Goal Status: Active Patient/caregiver will verbalize understanding of skin care regimen Date Initiated: 07/11/2014 Goal Status: Active Ulcer/skin breakdown will have a volume reduction of 30% by week 4 Date Initiated: 07/11/2014 Goal Status: Active Ulcer/skin breakdown will have a volume reduction of 50% by week 8 Date Initiated: 07/11/2014 [name] [name] [name] ([id number]) Goal Status: Active Ulcer/skin breakdown will have a volume reduction of 80% by week 12 Date Initiated: 07/11/2014 Goal Status: Active Ulcer/skin breakdown will heal within 14 weeks Date Initiated: 07/11/2014 Goal Status: Active Interventions: Assess patient/caregiver ability to obtain necessary supplies Assess patient/caregiver ability to perform ulcer/skin care regimen upon admission and as needed Assess ulceration(s) every visit Provide education on smoking Provide education on ulcer and skin care Screen for HBO Notes: Electronic Signature(s) Signed: 08/12/2015 5:12:30 PM By: Regan Lemming BSN, RN Entered By: Regan Lemming on 08/12/2015 10:16:08 [name] [name] [name] ([id number]) -------------------------------------------------------------------------------- Pain Assessment Details Patient Name: [name] [name] [name]  W. Date of Service: 08/12/2015 10:00 AM Medical Record Patient Account Number: [id number] Number: Treating RN: Afful, RN, BSN, Rita 1950-12-03 [phone] y.o. Other Clinician: Date of Birth/Sex: Female) Treating ROBSON, MICHAEL Primary Care Physician/Extender: G PATIENT, NO Physician: Referring Physician: Weeks in Treatment: 73 Active Problems Location of Pain Severity and Description of Pain Patient Has Paino No Site Locations Pain  Management and Medication Current Pain Management: Electronic Signature(s) Signed: 08/12/2015 5:12:30 PM By: Regan Lemming BSN, RN Entered By: Regan Lemming on 08/12/2015 09:58:45 [name] [name] [name] ([id number]) -------------------------------------------------------------------------------- Patient/Caregiver Education Details Patient Name: [name] [name] [name] Date of Service: 08/12/2015 10:00 AM Medical Record Patient Account Number: [id number] Number: Treating RN: Baruch Gouty, RN, BSN, Rita 15-Mar-1951 [phone] y.o. Other Clinician: Date of Birth/Gender: Female) Treating ROBSON, MICHAEL Primary Care Physician/Extender: G PATIENT, NO Physician: Weeks in Treatment: 3 Referring Physician: Education Assessment Education Provided To: Patient Education Topics Provided Malignant/Atypical Wounds: Methods: Explain/Verbal Responses: State content correctly Smoking and Wound Healing: Methods: Explain/Verbal Responses: State content correctly Welcome To [facility]: Methods: Explain/Verbal Responses: State content correctly Wound/Skin Impairment: Methods: Explain/Verbal Responses: State content correctly Electronic Signature(s) Signed: 08/12/2015 5:12:30 PM By: Regan Lemming BSN, RN Entered By: Regan Lemming on 08/12/2015 10:25:59 [name] [name] [name] ([id number]) -------------------------------------------------------------------------------- Wound Assessment Details Patient Name: [name] [name] [name] Date of Service: 08/12/2015 10:00 AM Medical Record Patient Account Number: [id number] Number: Treating RN: Baruch Gouty, RN, BSN, Rita 1950-10-24 [phone] y.o. Other Clinician: Date of Birth/Sex: Female) Treating ROBSON, MICHAEL Primary Care Physician/Extender: G PATIENT, NO Physician: Referring Physician: Weeks in Treatment: 56 Wound Status Wound Number: 1 Primary Malignant Wound Etiology: Wound Location: Left Chest Wound Status: Open Wounding Event: Other Lesion Comorbid Received  Chemotherapy, Received Date Acquired: 05/10/2014 History: Radiation Weeks Of Treatment: 56 Clustered Wound: No Photos Photo Uploaded By: Regan Lemming on 08/12/2015 16:34:33 Wound Measurements Length: (cm) 5.5 Width: (cm) 7 Depth: (cm) 0.2 Area: (cm) 30.238 Volume: (cm) 6.048 % Reduction in Area: -28.3% % Reduction in Volume: -28.4% Epithelialization: None Tunneling: No Undermining: No Wound Description Full Thickness Without Exposed Classification: Support Structures Wound Margin: Distinct, outline attached Exudate Large Amount: Exudate Type: Serosanguineous Exudate Color: red, brown Foul Odor After Cleansing: No Wound Bed [name], [name] [name] W. ([id number]) Granulation Amount: Small (1-33%) Exposed Structure Granulation Quality: Pink, Pale Fascia Exposed: No Necrotic Amount: Large (67-100%) Fat Layer Exposed: No Necrotic Quality: Adherent Slough Tendon Exposed: No Muscle Exposed: No Joint Exposed: No Bone Exposed: No Limited to Skin Breakdown Periwound Skin Texture Texture Color No Abnormalities Noted: No No Abnormalities Noted: Yes Callus: No Temperature / Pain Crepitus: No Temperature: No Abnormality Excoriation: No Tenderness on Palpation: Yes Fluctuance: No Friable: No Induration: Yes Localized Edema: No Rash: No Scarring: Yes Moisture No Abnormalities Noted: Yes Wound Preparation Ulcer Cleansing: Rinsed/Irrigated with Saline Topical Anesthetic Applied: Other: lidocaine 4%, Treatment Notes Wound #1 (Left Chest) 1. Cleansed with: Clean wound with Normal Saline 4. Dressing Applied: Aquacel Ag 5. Secondary Dressing Applied Bordered Foam Dressing Electronic Signature(s) Signed: 08/12/2015 5:12:30 PM By: Regan Lemming BSN, RN Entered By: Regan Lemming on 08/12/2015 10:15:15 [name] [name] [name] ([id number]) -------------------------------------------------------------------------------- Wound Assessment Details Patient Name: [name] [name] [name] Date of  Service: 08/12/2015 10:00 AM Medical Record Patient Account Number: [id number] Number: Treating RN: Baruch Gouty, RN, BSN, Rita 14-Nov-1950 [phone] y.o. Other Clinician: Date of Birth/Sex: Female) Treating ROBSON, MICHAEL Primary Care Physician/Extender: G PATIENT, NO Physician: Referring Physician: Weeks in Treatment: 56 Wound Status Wound Number: 4 Primary Atypical Etiology: Wound Location: Left Chest -  Distal Wound Status: Open Wounding Event: Radiation Burn Comorbid Received Chemotherapy, Received Date Acquired: 07/13/2015 History: Radiation Weeks Of Treatment: 4 Clustered Wound: No Photos Photo Uploaded By: Regan Lemming on 08/12/2015 16:34:34 Wound Measurements Length: (cm) 1 Width: (cm) 1 Depth: (cm) 0.1 Area: (cm) 0.785 Volume: (cm) 0.079 % Reduction in Area: 23.1% % Reduction in Volume: 61.3% Epithelialization: None Tunneling: No Undermining: No Wound Description Full Thickness Without Exposed Classification: Support Structures Wound Margin: Fibrotic scar, thickened scar Exudate Medium Amount: Exudate Type: Serosanguineous Exudate Color: red, brown Foul Odor After Cleansing: No Wound Bed [name], [name] [name] W. ([id number]) Granulation Amount: Large (67-100%) Exposed Structure Granulation Quality: Pink, Pale Fascia Exposed: No Necrotic Amount: Small (1-33%) Fat Layer Exposed: No Necrotic Quality: Adherent Slough Tendon Exposed: No Muscle Exposed: No Joint Exposed: No Bone Exposed: No Limited to Skin Breakdown Periwound Skin Texture Texture Color No Abnormalities Noted: No No Abnormalities Noted: No Callus: No Atrophie Blanche: No Crepitus: No Cyanosis: No Excoriation: No Ecchymosis: No Fluctuance: No Erythema: No Friable: No Hemosiderin Staining: No Induration: No Mottled: No Localized Edema: No Pallor: No Rash: No Rubor: No Scarring: No Temperature / Pain Moisture Temperature: No Abnormality No Abnormalities Noted: No Tenderness on  Palpation: Yes Dry / Scaly: No Maceration: No Moist: Yes Wound Preparation Ulcer Cleansing: Rinsed/Irrigated with Saline Topical Anesthetic Applied: Other: lidocaine 4%, Electronic Signature(s) Signed: 08/12/2015 5:12:30 PM By: Regan Lemming BSN, RN Entered By: Regan Lemming on 08/12/2015 10:14:24 [name] [name] [name] ([id number]) -------------------------------------------------------------------------------- Vitals Details Patient Name: [name] [name] [name] Date of Service: 08/12/2015 10:00 AM Medical Record Patient Account Number: [id number] Number: Treating RN: Baruch Gouty, RN, BSN, Rita 1950-05-16 [phone] y.o. Other Clinician: Date of Birth/Sex: Female) Treating ROBSON, MICHAEL Primary Care Physician/Extender: G PATIENT, NO Physician: Referring Physician: Weeks in Treatment: 1 Vital Signs Time Taken: 09:58 Temperature (F): 97.9 Height (in): 68 Pulse (bpm): 96 Weight (lbs): 180.5 Respiratory Rate (breaths/min): 17 Body Mass Index (BMI): 27.4 Blood Pressure (mmHg): 122/76 Reference Range: 80 - 120 mg / dl Electronic Signature(s) Signed: 08/12/2015 5:12:30 PM By: Regan Lemming BSN, RN Entered By: Regan Lemming on 08/12/2015 10:01:09

## 2015-08-14 ENCOUNTER — Inpatient Hospital Stay: Payer: Medicare Other

## 2015-08-14 ENCOUNTER — Inpatient Hospital Stay (HOSPITAL_BASED_OUTPATIENT_CLINIC_OR_DEPARTMENT_OTHER): Payer: Medicare Other | Admitting: Hematology and Oncology

## 2015-08-14 ENCOUNTER — Inpatient Hospital Stay: Payer: Medicare Other | Attending: Hematology and Oncology

## 2015-08-14 ENCOUNTER — Other Ambulatory Visit: Payer: Self-pay | Admitting: Hematology and Oncology

## 2015-08-14 VITALS — BP 132/83 | HR 99 | Temp 97.6°F | Resp 18 | Ht 67.0 in | Wt 161.5 lb

## 2015-08-14 DIAGNOSIS — C50412 Malignant neoplasm of upper-outer quadrant of left female breast: Secondary | ICD-10-CM | POA: Diagnosis not present

## 2015-08-14 DIAGNOSIS — C50912 Malignant neoplasm of unspecified site of left female breast: Secondary | ICD-10-CM

## 2015-08-14 DIAGNOSIS — Z170 Estrogen receptor positive status [ER+]: Secondary | ICD-10-CM

## 2015-08-14 DIAGNOSIS — F17210 Nicotine dependence, cigarettes, uncomplicated: Secondary | ICD-10-CM | POA: Insufficient documentation

## 2015-08-14 DIAGNOSIS — C50112 Malignant neoplasm of central portion of left female breast: Secondary | ICD-10-CM

## 2015-08-14 DIAGNOSIS — C792 Secondary malignant neoplasm of skin: Secondary | ICD-10-CM

## 2015-08-14 DIAGNOSIS — Z9011 Acquired absence of right breast and nipple: Secondary | ICD-10-CM | POA: Insufficient documentation

## 2015-08-14 DIAGNOSIS — Z853 Personal history of malignant neoplasm of breast: Secondary | ICD-10-CM | POA: Insufficient documentation

## 2015-08-14 DIAGNOSIS — Z79899 Other long term (current) drug therapy: Secondary | ICD-10-CM | POA: Diagnosis not present

## 2015-08-14 DIAGNOSIS — Z171 Estrogen receptor negative status [ER-]: Secondary | ICD-10-CM

## 2015-08-14 DIAGNOSIS — R918 Other nonspecific abnormal finding of lung field: Secondary | ICD-10-CM | POA: Diagnosis not present

## 2015-08-14 DIAGNOSIS — Z9221 Personal history of antineoplastic chemotherapy: Secondary | ICD-10-CM | POA: Insufficient documentation

## 2015-08-14 DIAGNOSIS — G893 Neoplasm related pain (acute) (chronic): Secondary | ICD-10-CM

## 2015-08-14 LAB — CBC WITH DIFFERENTIAL/PLATELET
Basophils Absolute: 0 10*3/uL (ref 0–0.1)
Basophils Relative: 1 %
Eosinophils Absolute: 0 10*3/uL (ref 0–0.7)
Eosinophils Relative: 0 %
HCT: 37.7 % (ref 35.0–47.0)
Hemoglobin: 12.8 g/dL (ref 12.0–16.0)
Lymphocytes Relative: 26 %
Lymphs Abs: 0.7 10*3/uL — ABNORMAL LOW (ref 1.0–3.6)
MCH: 28.7 pg (ref 26.0–34.0)
MCHC: 33.9 g/dL (ref 32.0–36.0)
MCV: 84.6 fL (ref 80.0–100.0)
Monocytes Absolute: 0.1 10*3/uL — ABNORMAL LOW (ref 0.2–0.9)
Monocytes Relative: 4 %
Neutro Abs: 2 10*3/uL (ref 1.4–6.5)
Neutrophils Relative %: 69 %
Platelets: 129 10*3/uL — ABNORMAL LOW (ref 150–440)
RBC: 4.46 MIL/uL (ref 3.80–5.20)
RDW: 15.2 % — ABNORMAL HIGH (ref 11.5–14.5)
WBC: 2.8 10*3/uL — ABNORMAL LOW (ref 3.6–11.0)

## 2015-08-14 LAB — COMPREHENSIVE METABOLIC PANEL
ALT: 15 U/L (ref 14–54)
AST: 27 U/L (ref 15–41)
Albumin: 3.8 g/dL (ref 3.5–5.0)
Alkaline Phosphatase: 93 U/L (ref 38–126)
Anion gap: 10 (ref 5–15)
BUN: 12 mg/dL (ref 6–20)
CO2: 24 mmol/L (ref 22–32)
Calcium: 9.2 mg/dL (ref 8.9–10.3)
Chloride: 104 mmol/L (ref 101–111)
Creatinine, Ser: 0.9 mg/dL (ref 0.44–1.00)
GFR calc Af Amer: >60 mL/min (ref >60)
GFR calc non Af Amer: >60 mL/min (ref >60)
Glucose, Bld: 166 mg/dL — ABNORMAL HIGH (ref 65–99)
Potassium: 3.7 mmol/L (ref 3.5–5.1)
Sodium: 138 mmol/L (ref 135–145)
Total Bilirubin: 0.4 mg/dL (ref 0.3–1.2)
Total Protein: 7.6 g/dL (ref 6.5–8.1)

## 2015-08-14 MED ORDER — FULVESTRANT 250 MG/5ML IM SOLN
500.0000 mg | INTRAMUSCULAR | Status: DC
  Administered 2015-08-14: 500 mg via INTRAMUSCULAR
  Filled 2015-08-14: qty 10

## 2015-08-14 NOTE — Progress Notes (Signed)
Spent 30 minutes providing emotional support.  Pt's granddaughter attempted suicide.  Emotional support provided.  Pt doesn't not need or feel the need to have any counseling or spiritual counsling provided from [facility].  Pt reports a little tingling in her feet.  Pt reports a decrease in appetite and believes it is related to [place].  Eats three meals a day but does it because she knows she needs to eat.  No nausea.  Last [facility] tomorrow and will have week off.  Was at wound care Center Tuesday and on left breast was looking bad according to pt, MD cut and mashed it hard.  Breast area was bleeding and pressure was applied to stop bleeding but it was really painful.

## 2015-08-14 NOTE — Progress Notes (Signed)
[facility] day:  08/14/2015   Chief Complaint: [name] [name] is a 65 y.o. female with recurrent breast cancer who is seen for assessment prior to monthly Faslodex and day 20 of cycle #1 Ibrance.  HPI: The patient was last seen in the medical oncology clinic on 07/25/2015.  At that time, she had increasing chest wall nodularity.  Pain was controlled with Tramadol.  CA27.29 had increased to 726.3.  She continued Faslodex.  She began [place] on 07/26/2015.  CBC on 08/08/2015 revealed a WBC of 3500 with an [place] of 1900.  Symptomatically, her appetite has decreased.  She denies any nausea.  She is eating 3 meals a day.  She continues to follow-up with wound care.  She has had progressive changes on her left chest wall.  Left chest wall is painful, but well managed with Tramadol.  Today is day 20 of 21 Ibrance.   Past Medical History  Diagnosis Date  . Personal history of malignant neoplasm of breast   . Anemia   . Blood transfusion without reported diagnosis   . Emphysema of lung ([place])   . History of left breast cancer , known active 2013    Past Surgical History  Procedure Laterality Date  . Oophrectomy  1980  . Abdominal hysterectomy  1995  . Breast surgery Right 1996    mastectomy  . Breast biopsy Left 2014  . Breast biopsy Left 01-21-14    Family History  Problem Relation Age of Onset  . Cancer Father   . Heart disease Sister   . Diabetes Sister   . Heart disease Brother   . Cancer Paternal Grandmother     breast    Social History:  reports that she has been smoking Cigarettes.  She has a 6.25 pack-year smoking history. She has never used smokeless tobacco. She reports that she does not drink alcohol or use illicit drugs.  She is alone today.  Allergies:  Allergies  Allergen Reactions  . Sulfa Antibiotics Nausea And Vomiting    headache    Current Medications: Current Outpatient Prescriptions  Medication Sig Dispense  Refill  . Calcium Carbonate-Vitamin D (CALCIUM + D PO) Take 2 tablets by mouth daily.    Marland Kitchen KLOR-CON 10 10 MEQ tablet TAKE 1 TABLET (10 MEQ TOTAL) BY MOUTH DAILY. 30 tablet 1  . LORazepam (ATIVAN) 0.5 MG tablet   0  . palbociclib (IBRANCE) 125 MG capsule Take 1 capsule (125 mg total) by mouth daily with breakfast. Take whole with food. 21 capsule 2  . traMADol (ULTRAM) 50 MG tablet Take 1 tablet (50 mg total) by mouth every 8 (eight) hours as needed. 90 tablet 0   No current facility-administered medications for this visit.   Facility-Administered Medications Ordered in Other Visits  Medication Dose Route Frequency Provider Last Rate Last Dose  . sodium chloride 0.9 % injection 10 mL  10 mL Intravenous PRN Lequita Asal, MD   10 mL at 10/08/14 1335  . sodium chloride 0.9 % injection 10 mL  10 mL Intracatheter PRN Lequita Asal, MD   10 mL at 11/26/14 1355    Review of Systems:  GENERAL:  Feels "ok".  No fevers or sweats.  Weight down 1 pound. PERFORMANCE STATUS (ECOG):  1 HEENT:  No visual changes, runny nose, sore throat, mouth sores or tenderness. Lungs: No shortness of breath.  No cough.  No hemoptysis. Cardiac:  No chest pain, palpitations, orthopnea,  or PND. GI:  Appetite 50%.  Constipation.  No nausea, vomiting, diarrhea, melena or hematochezia. GU:  No urgency, frequency, dysuria, or hematuria. Musculoskeletal:  No back pain.  No joint pain.  No muscle tenderness. Extremities:  No pain or swelling. Skin:  Hair thin.  Chest wall lesions (see HPI).  No rashes or skin changes. 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  No headache, numbness or weakness, or coordination issues. Endocrine:  No diabetes, thyroid issues, hot flashes or night sweats. Psych:  Emotional issues secondary to family.  Pain:  Chest wall lesions tender.  Pain controlled with Tramadol. Review of systems:  All other systems reviewed and found to be negative.   Physical Exam: Blood pressure 132/83,  pulse 99, temperature 97.6 F (36.4 C), temperature source Tympanic, resp. rate 18, height _0  (1.702 m), weight 161 lb 7.8 oz (73.25 kg).  GENERAL: Well developed, well nourished, woman sitting comfortably in the exam room in no acute distress. MENTAL STATUS: Alert and oriented to person, place and time. HEAD: Short gray wig. Normocephalic, atraumatic, face symmetric, no Cushingoid features. EYES: Brown eyes. Pupils equal round and reactive to light and accomodation. No conjunctivitis or scleral icterus. ENT: Oropharynx clear without lesion. Tongue normal. Mucous membranes moist.  NECK: Left neck cyst (stable). BREAST: Right chest wall lesions healed. Central upper sternal area flat. Left 1.5 cm axillary nodule (stable). Chest wall thickened. Coalesced necrotic open lesions over anterior chest s/p debridement (8 cm superior border, 5 cm down, and 5 cm inferior border). Nipple on medial border.  Nodules (2 fingertip- 1.5 cm) in skin below left clavicle. Left lateral denuded triangular area (1 cm) plus similar sized hypopigmented area. SKIN: Cutaneous breast cancer noted in breast section. No other lesions.  EXTREMITIES: No edema, no skin discoloration or tenderness. No palpable cords. LYMPH NODES: Left axillary ridge. No palpable cervical, supraclavicular, axillary or inguinal adenopathy  NEUROLOGICAL: Unremarkable.  PSYCH: Appropriate.   Appointment on 08/14/2015  Component Date Value Ref Range Status  . WBC 08/14/2015 2.8* 3.6 - 11.0 K/uL Final  . RBC 08/14/2015 4.46  3.80 - 5.20 MIL/uL Final  . Hemoglobin 08/14/2015 12.8  12.0 - 16.0 g/dL Final  . HCT 08/14/2015 37.7  35.0 - 47.0 % Final  . MCV 08/14/2015 84.6  80.0 - 100.0 fL Final  . MCH 08/14/2015 28.7  26.0 - 34.0 pg Final  . MCHC 08/14/2015 33.9  32.0 - 36.0 g/dL Final  . RDW 08/14/2015 15.2* 11.5 - 14.5 % Final  . Platelets 08/14/2015 129* 150 - 440 K/uL Final  . Neutrophils Relative % 08/14/2015 69    Final  . Neutro Abs 08/14/2015 2.0  1.4 - 6.5 K/uL Final  . Lymphocytes Relative 08/14/2015 26   Final  . Lymphs Abs 08/14/2015 0.7* 1.0 - 3.6 K/uL Final  . Monocytes Relative 08/14/2015 4   Final  . Monocytes Absolute 08/14/2015 0.1* 0.2 - 0.9 K/uL Final  . Eosinophils Relative 08/14/2015 0   Final  . Eosinophils Absolute 08/14/2015 0.0  0 - 0.7 K/uL Final  . Basophils Relative 08/14/2015 1   Final  . Basophils Absolute 08/14/2015 0.0  0 - 0.1 K/uL Final  . Sodium 08/14/2015 138  135 - 145 mmol/L Final  . Potassium 08/14/2015 3.7  3.5 - 5.1 mmol/L Final  . Chloride 08/14/2015 104  101 - 111 mmol/L Final  . CO2 08/14/2015 24  22 - 32 mmol/L Final  . Glucose, Bld 08/14/2015 166* 65 - 99 mg/dL  Final  . BUN 08/14/2015 12  6 - 20 mg/dL Final  . Creatinine, Ser 08/14/2015 0.90  0.44 - 1.00 mg/dL Final  . Calcium 08/14/2015 9.2  8.9 - 10.3 mg/dL Final  . Total Protein 08/14/2015 7.6  6.5 - 8.1 g/dL Final  . Albumin 08/14/2015 3.8  3.5 - 5.0 g/dL Final  . AST 08/14/2015 27  15 - 41 U/L Final  . ALT 08/14/2015 15  14 - 54 U/L Final  . Alkaline Phosphatase 08/14/2015 93  38 - 126 U/L Final  . Total Bilirubin 08/14/2015 0.4  0.3 - 1.2 mg/dL Final  . GFR calc non Af Amer 08/14/2015 >60  >60 mL/min Final  . GFR calc Af Amer 08/14/2015 >60  >60 mL/min Final   Comment: (NOTE) The eGFR has been calculated using the CKD EPI equation. This calculation has not been validated in all clinical situations. eGFR's persistently <60 mL/min signify possible Chronic Kidney Disease.   . Anion gap 08/14/2015 10  5 - 15 Final    Assessment:  SHENIQUA CAROLAN is a 65 y.o. African American woman with a history of stage I right breast cancer s/p modified radical mastectomy on 06/12/1996. Pathology revealed a grade III mutifocal infiltrating ductal carcinoma (tumor size 8.5 cm with an invasive component 1.6 cm). Twenty-one lymph nodes were negative. Tumor was ER/PR negative. She received 4 cycles of AC at  [facility].  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 PET scan on 05/22/2012 revealed multiple areas of disease. She received 6 cycles of Taxotere and Cytoxan (TC) from 06/06/2012 - 09/29/2012. PET scan on 09/07/2012 noted marked improvement. Post chemotherapy biopsy was positive. She was not a surgical candidate. She was placed on Femara.  PET scan on 05/03/2013 revealed a new focus along lateral left breast and a new level II cervical lymph node. She received radiation from 09/13/2013 - 11/05/2013. PET scan on 01/01/2014 revealed new nodule medial left chest wall pleural/paraspinal activity LUL. Multiple biopsies on 01/19/2014 were positive. She began daily letrozole and Ibrance on 02/12/2014. Despite treatment, new nodules formed. She received a total of 2 cycles (delays in therapy).   PET scan on 05/09/2014 revealed marked progression of disease with multifocal masses in chest wall (left > right). She did not receive Imbrance in 04/2014. CA27.29 has increased: 22.9 on 01/08/2014, 54 on 03/05/2014, 170.8 on 05/06/2014, 250.8 on 05/23/2014, 445.8 on 06/14/2014, and 531.1 on 07/02/2014.   She received 3 cycles of Xeloda (02/12 - 07/05/2014). Chest and abdomen CT scan on 07/05/2014 revealed persistent and slightly progressive diffuse locally invasive chest wall tumor. Bone scan on 07/25/2014 revealed no evidence of metastatic disease.   She received 8 cycles of [place] (08/06/2014 - 01/07/2015) on ACCRU [id number]. 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  Chest, abdomen, and pelvic CT scan on 10/24/2014 revealed improved multifocal inflammatory left breast cancer.  The dominant 2.6 x 3.5 cm lesion in the left upper outer left breast had decreased to 1.7 x 3.5 cm. The anterior sternal lesion has decreased from 2 x 4 cm to 1.5 x 3.9 cm. There were no suspicious  mediastinal, hilar or axillary adenopathy.    Chest, abdomen, and pelvic CT scan on 01/20/2015 revealed inflammatory left breast cancer with new necrotic appearing subcutaneous nodules in the lateral left breast and lateral left chest wall.   CA27.29 was 339.8 on 08/06/2014, 51.7 on 10/15/2014, 37.6 on 12/10/2014, 26.4  on 01/07/2015, 34.1 on 01/28/2015, and 49.3 on 03/10/2015,  258.6 on 06/12/2015, 421.3 on 07/10/2015, and 726.3 on 07/25/2015.  She received 2 cycles of Taxol given 3 weeks on/1 week off (02/10/2015 - 03/10/2015).  Chest and abdomen CT scan on 04/03/2015 revealed interval growth of superficial tumors in the lateral left breast and lateral left chest wall.  In the left lateral chest, disease had increased from 2.3 x 2 to 2.9 x 2.2 cm.  In the superficial lateral left chest wall, disease had increased from 1.7 x 1.2 cm to 2.7 x 1.8 cm.  There were stable findings of locally infiltrative tumor in the left chest wall and left axilla, including extensive left chest wall skin thickening, infiltrative soft tissue encasing the left axillary artery and focal pleural thickening in the anterior left mid pleural space.  There was no metastatic disease in the abdomen.  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  Estrogen receptor was > 90%, progesterone receptor was > 90%, and Her2/neu was 1+.    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 Treatment options of Faslodex (ESR1) and everolimus (approved) and temsirolimus (approved therapy for other tumor types).  She began Faslodex on 05/08/2015 (last 07/17/2015). She is currently day 20 of cycle #1 Ibrance (began 07/26/2015).  Chest, abdomen, and pelvic CT scans on 07/09/2015 revealed interval progression of disease in the  left anterior and lateral chest wall.  There was interval development of nodular pleural thickening in the posterior medial left hemi thorax with some focal fluid in the left major fissure. Findings raised concern for interval development of pleural involvement by tumor.  There was no evidence for metastatic disease in the abdomen   Symptomatically, she has significant chest wall nodularity. Pain is controlled with Tramadol.  Plan: 1.  Labs today: CBC with diff, CMP, CA27.29, Mg. 2.  Complete current cycle of Ibrance (last dose tomrrow).   3.  Discuss limited treatment options.  Continue current therapy with Faslodex and Ibrance.  This is her first cycle of Ibrance. 4.  Begin cycle #2 Ibrance on 08/23/2015. 5.  Faslodex today. 6.  Medical photographs today. 7.  RTC in 1 week for CBC with diff. 8.  RTC in 4 weeks for MD assess, labs (CBC with diff, CMP, CA27.29), and Faslodex.    MeLequita AsalMD  08/14/2015, 11:17 AM

## 2015-08-15 LAB — CANCER ANTIGEN 27.29: CA 27.29: 911 U/mL — ABNORMAL HIGH (ref 0.0–38.6)

## 2015-08-16 LAB — WOUND CULTURE

## 2015-08-19 ENCOUNTER — Encounter: Payer: Medicare Other | Admitting: Internal Medicine

## 2015-08-19 DIAGNOSIS — C50812 Malignant neoplasm of overlapping sites of left female breast: Secondary | ICD-10-CM | POA: Diagnosis not present

## 2015-08-20 ENCOUNTER — Encounter: Payer: Self-pay | Admitting: Hematology and Oncology

## 2015-08-20 NOTE — Progress Notes (Signed)
[name], [name] ([id number]) Visit Report for 08/19/2015 Chief Complaint Document Details Patient Name: [name] [name], [name] [name] Date of Service: 08/19/2015 10:45 AM Medical Record Patient Account Number: [id number] Number: Treating RN: Baruch Gouty, RN, BSN, Rita 31-Aug-1950 [phone] y.o. Other Clinician: Date of Birth/Sex: Female) Treating ROBSON, MICHAEL Primary Care Physician/Extender: G PATIENT, NO Physician: Referring Physician: Weeks in Treatment: 41 Information Obtained from: Patient Chief Complaint Patient presents to the wound care center for a consult due non healing wound patient is a self-referral who comes with a history of having open wounds to her left chest wall and right chest wall for about 2 months. 07/18/2014 -- since last week the patient has had no complaints and is on her last cycle of chemotherapy. I have reviewed 155 pages of her reports from a medical oncologist and the summary is made in the history of present illness. Electronic Signature(s) Signed: 08/20/2015 7:54:52 AM By: Linton Ham MD Entered By: Linton Ham on 08/19/2015 12:21:37 [name] [name] ([id number]) -------------------------------------------------------------------------------- HPI Details Patient Name: [name] [name] Date of Service: 08/19/2015 10:45 AM Medical Record Patient Account Number: [id number] Number: Treating RN: Baruch Gouty RN, BSN, Rita 05-11-1950 [phone] y.o. Other Clinician: Date of Birth/Sex: Female) Treating ROBSON, MICHAEL Primary Care Physician/Extender: G PATIENT, NO Physician: Referring Physician: Weeks in Treatment: 39 History of Present Illness HPI Description: this 65 year old patient has come by herself today as a self-referral and has no documentation with her. She has had open wounds to her left chest and the right chest wall for about 2 months. She is not a diabetic and has no significant medical history except breast cancer. Her right breast cancer  was diagnosed 18 years ago and she had a mastectomy and chemotherapy. 3 years ago she was then diagnosed with a breast cancer of the left side which was advanced and could not be operated and was given chemotherapy for a year and then followed by radiation. Her last radiation was over a year ago. She has not had any biopsy done recently from the ulcerated area but she says there have been other biopsies done from the chest wall and these reports are not available at the present time. She was told to keep the wounds open and let them dry out but she is finding it's more and more difficult to stop soiling her clothes if she leaves wounds open. She is on chemotherapy and we will try and obtain these notes from her oncologist. She is not in pain and does not have any significant problems except the discomfort from an open wound on the chest wall. 07/18/2014 This is a summary of [name] [name] date of birth 08-Jun-1950. These are obtained by reviewing 151 pages of medical records which were obtained from the [facility] 65 year old patient who had a history of stage I right breast cancer status post modified radical mastectomy on 06/12/1996. 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 She received 4 cycles of AC at [facility]. In January 2014 she presented with left breast swelling and apparently cellulitis. Breast biopsy done revealed lobular carcinoma which was ER and PR positive and HER-2/neu negative. On PET scan and she was shown to have multiple areas of disease left breast, axilla and supraclavicular areas. She received 6 cycles of chemotherapy and then received Femara because she was not a surgical candidate. PET scan also revealed disease in the left lateral breast and new cervical lymph nodes at level II. The patient  was then sent for radiation therapy and received this over a month. In January 2016 she had a PET CT scan done  which was compatible with marked progression of disease with multifocal soft tissue masses in the chest wall left greater than right. She is currently receiving Xeloda and is noted to have disease in the chest wall which is widely present. Her most recent lab work from 07/02/2014 shows that her CMP is within normal limits with a serum albumin of 4.4. The previous CBC was within normal limits with a WBC count of 4.3 hemoglobin of 14 hematocrit of 40.9 and platelets of 202. 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 We will continue with palliative care and make her comfortable as much as possible as far as her wound care goes. Present time there is no odor but if this does occur we would use carbon- Spivak, Foy W. ([id number]) based products to mask the odor. Notes were also reviewed from her surgeon Dr. Hervey Ard --closure on 05/27/2014 for breast biopsy in view of the fact that she had extensive disease on her chest wall. Biopsies are taken from the nodular metastatic disease overlying the sternum and nodules chosen for biopsy. Punch biopsies were taken and the pathology on this confirmed that this was consistent with carcinoma 08/15/2014 -- she is started on a new course of injectable chemotherapy and this is 2 weeks on and 2 weeks off. She has no new symptoms no bleeding from the wounds nor is there any odor. She is doing fine otherwise. 09/16/2014. She is doing very well and the right side wounds have completely healed. She has finished 2 cycles of chemotherapy and she is 2 weeks on and 2 weeks off. He is to restart her chemotherapy this week. No fresh complaints pain is within control and there is no odor from the wounds. 10/21/2014 -- overall she is doing very well and is on a  clinical trial with her medical oncologist. No issues with the wound on her left side and there is no odor or drainage. 11/25/2014 -- she continues to be under clinical trial protocol with the medical oncologist and has no fresh issues. 02/17/2015 -- she has been taken off the clinical trial protocol and now is on Taxol on a regular basis with her medical oncologists Dr. Susy Manor. 05/20/15; the patient follow see her for a nonhealing area on the left lateral chest. 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 She has been using Aquacel Ag on this area on a palliative basis. No debridement has been done. 06/17/15; the patient follows in this clinic monthly for a nonhealing area on her left lateral chest. She has known metastatic breast cancer which is metastatic to the chest wall. She returns today with a cluster of open areas on the lateral aspect of her chest. The nurses in the clinic described this as a large open area that progressively closed over. I've taken this opportunity to review her oncology status. On her arrival here she asked me to debridement the wounds on her chest stating that this was a request of [name] her medical oncologist. This is a patient who initially developed stage I right breast cancer treated with a modified radical mastectomy in 1998.  She really presented with inflammatory breast cancer in 2014 post chemotherapy biopsy was positive she was not a surgical candidate. Since then she has been treated with multiple regimens. Most particular to her wounds PET scan in January 2016 revealed marked progression of the disease with multifocal masses in the chest wall left greater than right. 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 Left chest wall biopsy on 04/29/15 revealed  metastatic carcinoma consistent with invasive lobular carcinoma. She has been receiving Faslodex. Weekly. She missed her last injection and is attributing this to the breakdown of tissue on her left chest wall. She was seen by [name] in early March however the record is not complete 07/16/15; patient arrives for her monthly visit the. I've reviewed her oncologist notes Dr. Nolon Stalls. Her notes for the increasing nodularity in the chest wall look. Her pain seems adequately controlled with tramadol. The measurements of the necrotic areas seem to be increasing. They are covered with a thick surface slough however I don't believe the debridement is indicated here. She is dressing knees with Aquacel Ag and foam 08/12/15; this is a patient I follow monthly. Her primary oncologist is [name] of the local oncology clinic. The patient lives in [place]. Unfortunately she has expanding necrotic mass in her left anterior chest which is no doubt malignant wounds from her known metastatic breast cancer. She also has a lot of tenderness on the lateral aspect of this wound which might be cellulitis that she appears to have some form of drainage. I have done a culture although this also could be from expanding necrotic tumor 08/19/15; the patient is here for review of culture reports. At the far left area of her necrotic wound was LENI, PANKONIN. ([id number]) purulent drainage last week. This grew both Citrobacter Koseri and MRSA. The patient does not feel systemically unwell although she is still having pain in the area. No fever. She is now on Ibrance for her metastatic breast cancer Electronic Signature(s) Signed: 08/20/2015 7:54:52 AM By: Linton Ham MD Entered By: Linton Ham on 08/19/2015 12:22:58 [name] [name] ([id number]) -------------------------------------------------------------------------------- Physical Exam Details Patient Name: [name] [name] Date of Service:  08/19/2015 10:45 AM Medical Record Patient Account Number: [id number] Number: Treating RN: Baruch Gouty RN, BSN, Rita [birth year] [phone] y.o. Other Clinician: Date of Birth/Sex: Female) Treating ROBSON, MICHAEL Primary Care Physician/Extender: G PATIENT, NO Physician: Referring Physician: Weeks in Treatment: 65 Constitutional Sitting or standing Blood Pressure is within target range for patient.. Pulse regular and within target range for patient.Marland Kitchen Respirations regular, non-labored and within target range.. Temperature is normal and within the target range for the patient.. Patient's appearance is neat and clean. Appears in no acute distress. Well nourished and well developed.. Eyes Conjunctivae have some redness. Mild discharge noted. No icterus. Respiratory Respiratory effort is easy and symmetric bilaterally. Rate is normal at rest and on room air.. Bilateral breath sounds are clear and equal in all lobes with no wheezes, rales or rhonchi.. Cardiovascular Heart rhythm and rate regular, without murmur or gallop. Appears euvolemic. Gastrointestinal (GI) Abdomen is soft and non-distended without masses or tenderness. Bowel sounds active in all quadrants.. No liver or spleen enlargement or tenderness.. Lymphatic It is difficult to feel into her left axilla due to scar tissue. There is no supraclavicular nodes and nothing I can feel the cervical chain. Integumentary (Hair, Skin) Large necrotic  wound across the left chest. This is been deteriorating over the period of time I have been following her. The open area where the purulence was last week has tenderness around this which is really quite exquisite. Notes Wound exam; again a necrotic expanding necrotic wound across the left chest. Multiple nodules which are likely malignant the. Still significant tenderness on the lateral chest where the purulent drainage was cultured last week Electronic Signature(s) Signed: 08/20/2015 7:54:52  AM By: Linton Ham MD Entered By: Linton Ham on 08/19/2015 12:25:30 [name] [name] ([id number]) -------------------------------------------------------------------------------- Physician Orders Details Patient Name: [name] [name] Date of Service: 08/19/2015 10:45 AM Medical Record Patient Account Number: [id number] Number: Treating RN: Baruch Gouty, RN, BSN, Rita 05-10-1950 [phone] y.o. Other Clinician: Date of Birth/Sex: Female) Treating ROBSON, MICHAEL Primary Care Physician/Extender: G PATIENT, NO Physician: Referring Physician: Weeks in Treatment: 50 Verbal / Phone Orders: Yes Clinician: Afful, RN, BSN, Rita Read Back and Verified: Yes Diagnosis Coding Wound Cleansing Wound #1 Left Chest o Cleanse wound with mild soap and water o May Shower, gently pat wound dry prior to applying new dressing. o May shower with protection. Wound #4 Left,Distal Chest o Cleanse wound with mild soap and water o May Shower, gently pat wound dry prior to applying new dressing. o May shower with protection. Primary Wound Dressing Wound #1 Left Chest o Aquacel Ag Wound #4 Left,Distal Chest o Aquacel Ag Secondary Dressing Wound #1 Left Chest o Boardered Foam Dressing Wound #4 Left,Distal Chest o Boardered Foam Dressing Dressing Change Frequency Wound #1 Left Chest o Change dressing every other day. - or more if needed Wound #4 Left,Distal Chest o Change dressing every other day. - or more if needed Follow-up Appointments KESSIE, CROSTON. ([id number]) Wound #1 Left Chest o Return Appointment in 1 week. Wound #4 Left,Distal Chest o Return Appointment in 1 week. Medications-please add to medication list. Wound #1 Left Chest o P.O. Antibiotics - Cefdinir 351m every 12 hrs for 10 days o P.O. Antibiotics - Doxycycline 1081mBID for 10 days by mouth Wound #4 Left,Distal Chest o P.O. Antibiotics - Cefdinir 3009mvery 12 hrs for 10 days o  P.O. Antibiotics - Doxycycline 100m24mD for 10 days by mouth Patient Medications Allergies: sulfa Notifications Medication Indication Start End cefdinir wound infection 08/19/2015 DOSE oral 300 mg capsule - capsule oral doxycycline hyclate wound infection 08/19/2015 DOSE oral 100 mg capsule - capsule oral Electronic Signature(s) Signed: 08/19/2015 4:23:41 PM By: AffuRegan Lemming, RN Signed: 08/20/2015 7:54:52 AM By: RobsLinton HamEntered By: AffuRegan Lemming05/12/2015 16:23:41 GOODEvlyn [id number]------------------------------------------------------------------------------ Prescription 08/19/2015 Patient Name: GOODEvlyn Couriersician: ROBSRicard DillonDate of Birth: 11/108/18/1952#: [id number]: F DEA#: [id number] #: [phone] #: [id number] Address[address] [place] [id number] [phone] sulfa Reaction: vomiting Severity: Mild Physician's Orders P.O. Antibiotics - Cefdinir 300mg31mry 12 hrs for 10 days Signature(s): Date(s): GOODMZITLALI, [id number] 08/19/2015 Patient Name: GOODMEvlyn Courierician: ROBSORicard Dillonate of Birth: [birth year]: [id number] F DEA#: [id number] #: [phone] #: [id number] Address: [id number] [id number] sulfa Reaction: vomiting Severity: Mild Medication Medication: Route: Strength: Form:  cefdinir oral 300 mg capsule Class: CEPHALOSPORINS - 3RD GENERATION Dose: Frequency / Time: Indication: capsule oral wound infection Number of Refills: Number of Units: 0 Generic Substitution: Start Date:  End Date: Administered at Substitution Permitted 6/0/6301 Facility: No Note to Pharmacy: Signature(s): Date(s): GUELDA, BATSON ([id number]) Prescription 08/19/2015 Patient Name: [name] [name] Physician: Ricard Dillon MD Date of Birth: 1950/12/01 NPI#: [id number] Sex: F DEA#: [id number] Phone #: [phone] License #: [id number] Patient Address: [place], [place] [address] [facility] [address] [place], [place] [address] [phone] Allergies sulfa Reaction: vomiting Severity: Mild Medication Medication: Route: Strength: Form: doxycycline hyclate oral 100 mg capsule Class: PERIODONTAL COLLAGENASE INHIBITORS Dose: Frequency / Time: Indication: capsule oral wound infection Number of Refills: Number of Units: 0 Generic Substitution: Start Date: End Date: Administered at Substitution Permitted 12/14/7167 Facility: No Note to Pharmacy: Signature(s): Date(s): TOPAZ, RAGLIN ([id number]) Electronic Signature(s) Signed: 08/19/2015 5:19:00 PM By: Regan Lemming BSN, RN Signed: 08/20/2015 7:54:52 AM By: Linton Ham MD Entered By: Regan Lemming on 08/19/2015 16:23:41 [name] [name] ([id number]) --------------------------------------------------------------------------------  Problem List Details Patient Name: [name] [name] Date of Service: 08/19/2015 10:45 AM Medical Record Patient Account Number: [id number] Number: Treating RN: Baruch Gouty, RN, BSN, Rita 04-04-1951 [phone] y.o. Other Clinician: Date of Birth/Sex: Female) Treating ROBSON, MICHAEL Primary Care Physician/Extender: G PATIENT, NO Physician: Referring Physician: Weeks in Treatment: 74 Active Problems ICD-10 Encounter Code Description Active Date Diagnosis C50.812 Malignant neoplasm of overlapping sites of left female 07/11/2014 Yes breast S21.001A Unspecified open wound of right breast,  initial encounter 07/11/2014 Yes C50.412 Malignant neoplasm of upper-outer quadrant of left female 07/11/2014 Yes breast S21.002A Unspecified open wound of left breast, initial encounter 07/11/2014 Yes Inactive Problems Resolved Problems Electronic Signature(s) Signed: 08/20/2015 7:54:52 AM By: Linton Ham MD Entered By: Linton Ham on 08/19/2015 12:21:27 [name] [name] ([id number]) -------------------------------------------------------------------------------- Progress Note Details Patient Name: [name] [name] Date of Service: 08/19/2015 10:45 AM Medical Record Patient Account Number: [id number] Number: Treating RN: Baruch Gouty, RN, BSN, Rita March 25, 1951 [phone] y.o. 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 07/18/2014 -- since last week the patient has had no complaints and is on her last cycle of chemotherapy. I have reviewed 155 pages of her reports from a medical oncologist and the summary is made in the history of present illness. History of Present Illness (HPI) this 65 year old patient has come by herself today as a self-referral and has no documentation with her. She has had open wounds to her left chest and the right chest wall for about 2 months. She is not a diabetic and has no significant medical history except breast cancer. Her right breast cancer was diagnosed 18 years ago and she had a mastectomy and chemotherapy. 3 years ago she was then diagnosed with a breast cancer of the left side which was advanced and could not be operated and was given  chemotherapy for a year and then followed by radiation. Her last radiation was over a year ago. She has not had any biopsy done recently from the  ulcerated area but she says there have been other biopsies done from the chest wall and these reports are not available at the present time. She was told to keep the wounds open and let them dry out but she is finding it's more and more difficult to stop soiling her clothes if she leaves wounds open. She is on chemotherapy and we will try and obtain these notes from her oncologist. She is not in pain and does not have any significant problems except the discomfort from an open wound on the chest wall. 07/18/2014 This is a summary of Olamide Lahaie date of birth 19-Aug-1950. These are obtained by reviewing 151 pages of medical records which were obtained from the [facility] 65 year old patient who had a history of stage I right breast cancer status post modified radical mastectomy on 06/12/1996. 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 She received 4 cycles of AC at [facility]. In January 2014 she presented with left breast swelling and apparently cellulitis. Breast biopsy done revealed lobular carcinoma which was ER and PR positive and HER-2/neu negative. On PET scan and she was shown to have multiple areas of disease left breast, axilla and supraclavicular areas. She received 6 cycles of chemotherapy and then received Femara because she was not a surgical candidate. PET scan also revealed disease in the left lateral breast and new cervical lymph nodes at level II. The patient was then sent for radiation therapy and received this over a month. In January 2016 she had a PET CT scan done which was compatible with marked progression of disease Sauseda, Hamsini W. ([id number]) with multifocal soft tissue masses in the chest wall left greater than right. She is currently receiving Xeloda and is  noted to have disease in the chest wall which is widely present. Her most recent lab work from 07/02/2014 shows that her CMP is within normal limits with a serum albumin of 4.4. The previous CBC was within normal limits with a WBC count of 4.3 hemoglobin of 14 hematocrit of 40.9 and platelets of 202. 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 We will continue with palliative care and make her comfortable as much as possible as far as her wound care goes. Present time there is no odor but if this does occur we would use carbon- based products to mask the odor. Notes were also reviewed from her surgeon Dr. Hervey Ard --closure on 05/27/2014 for breast biopsy in view of the fact that she had extensive disease on her chest wall. Biopsies are taken from the nodular metastatic disease overlying the sternum and nodules chosen for biopsy. Punch biopsies were taken and the pathology on this confirmed that this was consistent with carcinoma 08/15/2014 -- she is started on a new course of injectable chemotherapy and this is 2 weeks on and 2 weeks off. She has no new symptoms no bleeding from the wounds nor is there any odor. She is doing fine otherwise. 09/16/2014. She is doing very well and the right side wounds have completely healed. She has finished 2 cycles of chemotherapy and she is 2 weeks on and 2 weeks off. He is to restart her chemotherapy this week. No fresh complaints pain is within control and there is no odor  from the wounds. 10/21/2014 -- overall she is doing very well and is on a clinical trial with her medical oncologist. No issues with the wound on her left side and there is no odor or drainage. 11/25/2014 -- she continues to be under clinical trial protocol with the medical oncologist  and has no fresh issues. 02/17/2015 -- she has been taken off the clinical trial protocol and now is on Taxol on a regular basis with her medical oncologists Dr. Susy Manor. 05/20/15; the patient follow see her for a nonhealing area on the left lateral chest. 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 She has been using Aquacel Ag on this area on a palliative basis. No debridement has been done. 06/17/15; the patient follows in this clinic monthly for a nonhealing area on her left lateral chest. She has known metastatic breast cancer which is metastatic to the chest wall. She returns today with a cluster of open areas on the lateral aspect of her chest. The nurses in the clinic described this as a large open area that progressively closed over. I've taken this opportunity to review her oncology status. On her arrival here she asked me to debridement the wounds on her chest stating that this was a request of [name] her medical oncologist. This is a patient who initially developed stage I right breast cancer treated with a modified radical mastectomy in 1998. She really presented with inflammatory breast cancer in 2014 post chemotherapy biopsy was positive she was not a surgical candidate. Since then she has been treated with multiple regimens. Most particular to her wounds PET scan in January 2016 revealed marked progression of the disease with multifocal masses in the chest wall left greater than right. 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 She has been receiving Faslodex. Weekly. She missed her last injection and is attributing this to the breakdown of tissue on her left chest wall. She  was seen by [name] in early March however the record is not complete 07/16/15; patient arrives for her monthly visit the. I've reviewed her oncologist notes Dr. Nolon Stalls. Her notes for the increasing nodularity in the chest wall look. Her pain seems adequately controlled with CHERESA, SIERS. ([id number]) tramadol. The measurements of the necrotic areas seem to be increasing. They are covered with a thick surface slough however I don't believe the debridement is indicated here. She is dressing knees with Aquacel Ag and foam 08/12/15; this is a patient I follow monthly. Her primary oncologist is [name] of the local oncology clinic. The patient lives in [place]. Unfortunately she has expanding necrotic mass in her left anterior chest which is no doubt malignant wounds from her known metastatic breast cancer. She also has a lot of tenderness on the lateral aspect of this wound which might be cellulitis that she appears to have some form of drainage. I have done a culture although this also could be from expanding necrotic tumor 08/19/15; the patient is here for review of culture reports. At the far left area of her necrotic wound was purulent drainage last week. This grew both Citrobacter Koseri and MRSA. The patient does not feel systemically unwell although she is still having pain in the area. No fever.  She is now on Ibrance for her metastatic breast cancer Objective Constitutional Sitting or standing Blood Pressure is within target range for patient.. Pulse regular and within target range for patient.Marland Kitchen Respirations regular, non-labored and within target range.. Temperature is normal and within the target range for the patient.. Patient's appearance is neat and clean. Appears in no acute distress. Well nourished and well developed.. Vitals Time Taken: 10:51 AM, Height: 68 in, Weight: 180.5 lbs, BMI: 27.4, Temperature: 98.4 F, Pulse: 99 bpm, Respiratory Rate: 17 breaths/min,  Blood Pressure: 132/69 mmHg. Eyes Conjunctivae have some redness. Mild discharge noted. No icterus. Respiratory Respiratory effort is easy and symmetric bilaterally. Rate is normal at rest and on room air.. Bilateral breath sounds are clear and equal in all lobes with no wheezes, rales or rhonchi.. Cardiovascular Heart rhythm and rate regular, without murmur or gallop. Appears euvolemic. Gastrointestinal (GI) Abdomen is soft and non-distended without masses or tenderness. Bowel sounds active in all quadrants.. No liver or spleen enlargement or tenderness.. Lymphatic It is difficult to feel into her left axilla due to scar tissue. There is no supraclavicular nodes and nothing I can feel the cervical chain. General Notes: Wound exam; again a necrotic expanding necrotic wound across the left chest. Multiple Archbold, Taisley W. ([id number]) nodules which are likely malignant the. Still significant tenderness on the lateral chest where the purulent drainage was cultured last week Integumentary (Hair, Skin) Large necrotic wound across the left chest. This is been deteriorating over the period of time I have been following her. The open area where the purulence was last week has tenderness around this which is really quite exquisite. Wound #1 status is Open. Original cause of wound was Other Lesion. The wound is located on the Left Chest. The wound measures 5.5cm length x 7.3cm width x 0.2cm depth; 31.534cm^2 area and 6.307cm^3 volume. The wound is limited to skin breakdown. There is no tunneling or undermining noted. There is a large amount of serosanguineous drainage noted. The wound margin is fibrotic, thickened scar. There is small (1-33%) pink, pale granulation within the wound bed. There is a large (67-100%) amount of necrotic tissue within the wound bed including Adherent Slough. The periwound skin appearance had no abnormalities noted for moisture. The periwound skin appearance had no  abnormalities noted for color. The periwound skin appearance exhibited: Induration, Scarring. The periwound skin appearance did not exhibit: Callus, Crepitus, Excoriation, Fluctuance, Friable, Localized Edema, Rash. Periwound temperature was noted as No Abnormality. The periwound has tenderness on palpation. Wound #4 status is Open. Original cause of wound was Radiation Burn. The wound is located on the Left,Distal Chest. The wound measures 1cm length x 1cm width x 0.1cm depth; 0.785cm^2 area and 0.079cm^3 volume. The wound is limited to skin breakdown. There is no tunneling or undermining noted. There is a medium amount of serosanguineous drainage noted. The wound margin is fibrotic, thickened scar. There is large (67-100%) pink, pale granulation within the wound bed. There is a small (1-33%) amount of necrotic tissue within the wound bed including Adherent Slough. The periwound skin appearance exhibited: Moist. The periwound skin appearance did not exhibit: Callus, Crepitus, Excoriation, Fluctuance, Friable, Induration, Localized Edema, Rash, Scarring, Dry/Scaly, Maceration, Atrophie Blanche, Cyanosis, Ecchymosis, Hemosiderin Staining, Mottled, Pallor, Rubor, Erythema. Periwound temperature was noted as No Abnormality. The periwound has tenderness on palpation. Assessment Active Problems ICD-10 C50.812 - Malignant neoplasm of overlapping sites of left female breast S21.001A - Unspecified open wound of right breast, initial encounter C50.412 - Malignant neoplasm  of upper-outer quadrant of left female breast S21.002A - Unspecified open wound of left breast, initial encounter [facility] ([id number]) Wound Cleansing: Wound #1 Left Chest: Cleanse wound with mild soap and water May Shower, gently pat wound dry prior to applying new dressing. May shower with protection. Wound #4 Left,Distal Chest: Cleanse wound with mild soap and water May Shower, gently pat wound dry prior to  applying new dressing. May shower with protection. Primary Wound Dressing: Wound #1 Left Chest: Aquacel Ag Wound #4 Left,Distal Chest: Aquacel Ag Secondary Dressing: Wound #1 Left Chest: Boardered Foam Dressing Wound #4 Left,Distal Chest: Boardered Foam Dressing Dressing Change Frequency: Wound #1 Left Chest: Change dressing every other day. - or more if needed Wound #4 Left,Distal Chest: Change dressing every other day. - or more if needed Follow-up Appointments: Wound #1 Left Chest: Return Appointment in 1 week. Wound #4 Left,Distal Chest: Return Appointment in 1 week. Medications-please add to medication list.: Wound #1 Left Chest: P.O. Antibiotics - Cefdinir 33m every 12 hrs for 10 days P.O. Antibiotics - Doxycycline 1067mBID for 10 days by mouth Wound #4 Left,Distal Chest: P.O. Antibiotics - Cefdinir 30015mvery 12 hrs for 10 days P.O. Antibiotics - Doxycycline 100m35mD for 10 days by mouth #1 I think we are going to continue with the Aquacel Ag which is largely a palliative dressing #2 I had to adjust antibiotics for the Ibrance. Interaction with quinolones noted. I put her on both Cefdinir and doxycycline. #3 does not appear to be a significant pain issue. She is not systemically unwell. I felt we could go ahead and start her on oral antibiotics GOODADRIAN, [id number] Signature(s) Signed: 08/20/2015 7:54:52 AM By: RobsLinton HamEntered By: RobsLinton Ham05/12/2015 12:26:59 GOODEvlyn [id number]------------------------------------------------------------------------------ SuperBill Details Patient Name: GOODEvlyn Couriere of Service: 08/19/2015 Medical Record Patient Account Number: [id number]: Treating RN: AffuBaruch Gouty BSN, Rita 11/123-Jun-1952 [phone]. Other Clinician: Date of Birth/Sex: Female) Treating ROBSON, MICHAEL Primary Care Physician/Extender: G PATIENT, NO Physician: Weeks in Treatment:  57 R75erring Physician: Diagnosis Coding ICD-10 Codes Code Description C50.812 Malignant neoplasm of overlapping sites of left female breast S21.001A Unspecified open wound of right breast, initial encounter C50.412 Malignant neoplasm of upper-outer quadrant of left female breast S21.002A Unspecified open wound of left breast, initial encounter Physician Procedures CPT4 Code: [id number]: [id number] PHYS LEVEL 4 - EST PT ICD-10 Description Diagnosis C50.812 Malignant neoplasm of overlapping sites of left Modifier: female breast Quantity: 1 Electronic Signature(s) Signed: 08/20/2015 7:54:52 AM By: RobsLinton HamEntered By: RobsLinton Ham05/12/2015 12:27:36

## 2015-08-20 NOTE — Progress Notes (Signed)
ELYCIA, ROTHSCHILD ([id number]) Visit Report for 08/19/2015 Arrival Information Details Patient Name: [name] [name], [name] [name] Date of Service: 08/19/2015 10:45 AM Medical Record Patient Account Number: [id number] Number: Treating RN: Baruch Gouty, RN, BSN, Rita [birth year] [phone] y.o. Other Clinician: Date of Birth/Sex: Female) Treating ROBSON, MICHAEL Primary Care Physician/Extender: G PATIENT, NO Physician: Referring Physician: Weeks in Treatment: 84 Visit Information History Since Last Visit Added or deleted any medications: No Patient Arrived: Ambulatory Any new allergies or adverse reactions: No Arrival Time: 10:49 Had a fall or experienced change in No Accompanied By: self activities of daily living that may affect Transfer Assistance: None risk of falls: Patient Identification Verified: Yes Signs or symptoms of abuse/neglect since last No Secondary Verification Process Yes visito Completed: Hospitalized since last visit: No Patient Requires Transmission-Based No Has Dressing in Place as Prescribed: Yes Precautions: Pain Present Now: No Patient Has Alerts: No Electronic Signature(s) Signed: 08/19/2015 5:19:00 PM By: Regan Lemming BSN, RN Entered By: Regan Lemming on 08/19/2015 10:51:08 [name] [name] ([id number]) -------------------------------------------------------------------------------- Clinic Level of Care Assessment Details Patient Name: [name] [name] Date of Service: 08/19/2015 10:45 AM Medical Record Patient Account Number: [id number] Number: Treating RN: Baruch Gouty, RN, BSN, Rita 1950/08/06 [phone] y.o. Other Clinician: Date of Birth/Sex: Female) Treating ROBSON, MICHAEL Primary Care Physician/Extender: G PATIENT, NO Physician: Referring Physician: Weeks in Treatment: 79 Clinic Level of Care Assessment Items TOOL 4 Quantity Score []  - Use when only an EandM is performed on FOLLOW-UP visit 0 ASSESSMENTS - Nursing Assessment / Reassessment X - Reassessment  of Co-morbidities (includes updates in patient status) 1 10 X - Reassessment of Adherence to Treatment Plan 1 5 ASSESSMENTS - Wound and Skin Assessment / Reassessment []  - Simple Wound Assessment / Reassessment - one wound 0 X - Complex Wound Assessment / Reassessment - multiple wounds 2 5 []  - Dermatologic / Skin Assessment (not related to wound area) 0 ASSESSMENTS - Focused Assessment []  - Circumferential Edema Measurements - multi extremities 0 []  - Nutritional Assessment / Counseling / Intervention 0 []  - Lower Extremity Assessment (monofilament, tuning fork, pulses) 0 []  - Peripheral Arterial Disease Assessment (using hand held doppler) 0 ASSESSMENTS - Ostomy and/or Continence Assessment and Care []  - Incontinence Assessment and Management 0 []  - Ostomy Care Assessment and Management (repouching, etc.) 0 PROCESS - Coordination of Care X - Simple Patient / Family Education for ongoing care 1 15 []  - Complex (extensive) Patient / Family Education for ongoing care 0 []  - Staff obtains Consents, Records, Test Results / Process Orders 0 MEE, CHERNEY ([id number]) []  - Staff telephones HHA, Nursing Homes / Clarify orders / etc 0 []  - Routine Transfer to another Facility (non-emergent condition) 0 []  - Routine Hospital Admission (non-emergent condition) 0 []  - New Admissions / Biomedical engineer / Ordering NPWT, Apligraf, etc. 0 []  - Emergency Hospital Admission (emergent condition) 0 []  - Simple Discharge Coordination 0 []  - Complex (extensive) Discharge Coordination 0 PROCESS - Special Needs []  - Pediatric / Minor Patient Management 0 []  - Isolation Patient Management 0 []  - Hearing / Language / Visual special needs 0 []  - Assessment of Community assistance (transportation, D/C planning, etc.) 0 []  - Additional assistance / Altered mentation 0 []  - Support Surface(s) Assessment (bed, cushion, seat, etc.) 0 INTERVENTIONS - Wound Cleansing / Measurement []  - Simple Wound  Cleansing - one wound 0 X - Complex Wound Cleansing - multiple wounds 2 5 X - Wound Imaging (photographs - any number of wounds) 1 5 []  -  Wound Tracing (instead of photographs) 0 []  - Simple Wound Measurement - one wound 0 X - Complex Wound Measurement - multiple wounds 2 5 INTERVENTIONS - Wound Dressings X - Small Wound Dressing one or multiple wounds 2 10 []  - Medium Wound Dressing one or multiple wounds 0 []  - Large Wound Dressing one or multiple wounds 0 []  - Application of Medications - topical 0 []  - Application of Medications - injection 0 [place], Jewelz W. ([id number]) INTERVENTIONS - Miscellaneous []  - External ear exam 0 []  - Specimen Collection (cultures, biopsies, blood, body fluids, etc.) 0 []  - Specimen(s) / Culture(s) sent or taken to Lab for analysis 0 []  - Patient Transfer (multiple staff / Harrel Lemon Lift / Similar devices) 0 []  - Simple Staple / Suture removal (25 or less) 0 []  - Complex Staple / Suture removal (26 or more) 0 []  - Hypo / Hyperglycemic Management (close monitor of Blood Glucose) 0 []  - Ankle / Brachial Index (ABI) - do not check if billed separately 0 X - Vital Signs 1 5 Has the patient been seen at the hospital within the last three years: Yes Total Score: 90 Level Of Care: New/Established - Level 3 Electronic Signature(s) Signed: 08/19/2015 5:05:00 PM By: Regan Lemming BSN, RN Entered By: Regan Lemming on 08/19/2015 17:04:59 [name] [name] ([id number]) -------------------------------------------------------------------------------- Encounter Discharge Information Details Patient Name: [name] [name] Date of Service: 08/19/2015 10:45 AM Medical Record Patient Account Number: [id number] Number: Treating RN: Baruch Gouty, RN, BSN, Rita 1950-04-20 [phone] y.o. Other Clinician: Date of Birth/Sex: Female) Treating ROBSON, MICHAEL Primary Care Physician/Extender: G PATIENT, NO Physician: Referring Physician: Weeks in Treatment: 67 Encounter Discharge  Information Items Discharge Pain Level: 0 Discharge Condition: Stable Ambulatory Status: Ambulatory Discharge Destination: Home Transportation: Private Auto Accompanied By: self Schedule Follow-up Appointment: No Medication Reconciliation completed No and provided to Patient/Care Aleza Pew: Patient Clinical Summary of Care: Declined Electronic Signature(s) Signed: 08/19/2015 4:25:55 PM By: Regan Lemming BSN, RN Previous Signature: 08/19/2015 4:24:28 PM Version By: Regan Lemming BSN, RN Previous Signature: 08/19/2015 11:11:59 AM Version By: Ruthine Dose Entered By: Regan Lemming on 08/19/2015 16:25:55 [name] [name] ([id number]) -------------------------------------------------------------------------------- Multi Wound Chart Details Patient Name: [name] [name] Date of Service: 08/19/2015 10:45 AM Medical Record Patient Account Number: [id number] Number: Treating RN: Baruch Gouty, RN, BSN, Rita 03-16-1951 [phone] y.o. Other Clinician: Date of Birth/Sex: Female) Treating ROBSON, MICHAEL Primary Care Physician/Extender: G PATIENT, NO Physician: Referring Physician: Weeks in Treatment: 50 Vital Signs Height(in): 68 Pulse(bpm): 99 Weight(lbs): 180.5 Blood Pressure 132/69 (mmHg): Body Mass Index(BMI): 27 Temperature(F): 98.4 Respiratory Rate 17 (breaths/min): Photos: [1:No Photos] [4:No Photos] [N/A:N/A] Wound Location: [1:Left Chest] [4:Left Chest - Distal] [N/A:N/A] Wounding Event: [1:Other Lesion] [4:Radiation Burn] [N/A:N/A] Primary Etiology: [1:Malignant Wound] [4:Malignant Wound] [N/A:N/A] Comorbid History: [1:Received Chemotherapy, Received Radiation] [4:Received Chemotherapy, Received Radiation] [N/A:N/A] Date Acquired: [1:05/10/2014] [4:07/13/2015] [N/A:N/A] Weeks of Treatment: [1:57] [4:5] [N/A:N/A] Wound Status: [1:Open] [4:Open] [N/A:N/A] Measurements L x W x D 5.5x7.3x0.2 [4:1x1x0.1] [N/A:N/A] (cm) Area (cm) : [1:31.534] [4:0.785] [N/A:N/A] Volume (cm) :  [1:6.307] [4:0.079] [N/A:N/A] % Reduction in Area: [1:-33.80%] [4:23.10%] [N/A:N/A] % Reduction in Volume: -33.80% [4:61.30%] [N/A:N/A] Classification: [1:Full Thickness Without Exposed Support Structures] [4:Full Thickness Without Exposed Support Structures] [N/A:N/A] Exudate Amount: [1:Large] [4:Medium] [N/A:N/A] Exudate Type: [1:Serosanguineous] [4:Serosanguineous] [N/A:N/A] Exudate Color: [1:red, brown] [4:red, brown] [N/A:N/A] Wound Margin: [1:Fibrotic scar, thickened scar] [4:Fibrotic scar, thickened scar] [N/A:N/A] Granulation Amount: [1:Small (1-33%)] [4:Large (67-100%)] [N/A:N/A] Granulation Quality: [1:Pink, Pale] [4:Pink, Pale] [N/A:N/A] Necrotic Amount: [1:Large (67-100%)] [4:Small (1-33%)] [N/A:N/A]  Exposed Structures: [N/A:N/A] Fascia: No Fascia: No Fat: No Fat: No Tendon: No Tendon: No Muscle: No Muscle: No Joint: No Joint: No Bone: No Bone: No Limited to Skin Limited to Skin Breakdown Breakdown Epithelialization: None None N/A Periwound Skin Texture: Induration: Yes Edema: No N/A Scarring: Yes Excoriation: No Edema: No Induration: No Excoriation: No Callus: No Callus: No Crepitus: No Crepitus: No Fluctuance: No Fluctuance: No Friable: No Friable: No Rash: No Rash: No Scarring: No Periwound Skin No Abnormalities Noted Moist: Yes N/A Moisture: Maceration: No Dry/Scaly: No Periwound Skin Color: No Abnormalities Noted Atrophie Blanche: No N/A Cyanosis: No Ecchymosis: No Erythema: No Hemosiderin Staining: No Mottled: No Pallor: No Rubor: No Temperature: No Abnormality No Abnormality N/A Tenderness on Yes Yes N/A Palpation: Wound Preparation: Ulcer Cleansing: Ulcer Cleansing: N/A Rinsed/Irrigated with Rinsed/Irrigated with Saline Saline Topical Anesthetic Topical Anesthetic Applied: None Applied: None Treatment Notes Electronic Signature(s) Signed: 08/19/2015 5:19:00 PM By: Regan Lemming BSN, RN Entered By: Regan Lemming on 08/19/2015  11:00:25 [name] [name] ([id number]) -------------------------------------------------------------------------------- Multi-Disciplinary Care Plan Details Patient Name: [name] [name] Date of Service: 08/19/2015 10:45 AM Medical Record Patient Account Number: [id number] Number: Treating RN: Baruch Gouty, RN, BSN, Rita [birth year] [phone] y.o. Other Clinician: Date of Birth/Sex: Female) Treating ROBSON, MICHAEL Primary Care Physician/Extender: G PATIENT, NO Physician: Referring Physician: Weeks in Treatment: 26 Active Inactive Abuse / Safety / Falls / Self Care Management Nursing Diagnoses: Potential for falls Goals: Patient will remain injury free Date Initiated: 07/11/2014 Goal Status: Active Interventions: Assess fall risk on admission and as needed Notes: Malignancy/Atypical Etiology Nursing Diagnoses: Knowledge deficit related to disease process and management of atypical ulcer etiology Goals: Patient/caregiver will verbalize understanding of disease process and disease management of atypical ulcer etiology Date Initiated: 07/11/2014 Goal Status: Active Interventions: Provide education on atypical ulcer etiologies Notes: Nutrition Nursing Diagnoses: Potential for alteratiion in Nutrition/Potential for imbalanced nutrition Spikes, Early W. ([id number]) Goals: Patient/caregiver agrees to and verbalizes understanding of need to use nutritional supplements and/or vitamins as prescribed Date Initiated: 07/11/2014 Goal Status: Active Interventions: Assess patient nutrition upon admission and as needed per policy Notes: Orientation to the Wound Care Program Nursing Diagnoses: Knowledge deficit related to the wound healing center program Goals: Patient/caregiver will verbalize understanding of the [facility] Date Initiated: 07/11/2014 Goal Status: Active Interventions: Provide education on orientation to the wound center Notes: Wound/Skin  Impairment Nursing Diagnoses: Impaired tissue integrity Goals: Patient will demonstrate a reduced rate of smoking or cessation of smoking Date Initiated: 07/11/2014 Goal Status: Active Patient will have a decrease in wound volume by X% from date: (specify in notes) Date Initiated: 07/11/2014 Goal Status: Active Patient/caregiver will verbalize understanding of skin care regimen Date Initiated: 07/11/2014 Goal Status: Active Ulcer/skin breakdown will have a volume reduction of 30% by week 4 Date Initiated: 07/11/2014 Goal Status: Active Ulcer/skin breakdown will have a volume reduction of 50% by week 8 Date Initiated: 07/11/2014 [name] [name] ([id number]) Goal Status: Active Ulcer/skin breakdown will have a volume reduction of 80% by week 12 Date Initiated: 07/11/2014 Goal Status: Active Ulcer/skin breakdown will heal within 14 weeks Date Initiated: 07/11/2014 Goal Status: Active Interventions: Assess patient/caregiver ability to obtain necessary supplies Assess patient/caregiver ability to perform ulcer/skin care regimen upon admission and as needed Assess ulceration(s) every visit Provide education on smoking Provide education on ulcer and skin care Screen for HBO Notes: Electronic Signature(s) Signed: 08/19/2015 5:19:00 PM By: Regan Lemming BSN, RN Entered By: Regan Lemming on 08/19/2015 10:58:45 Boeve, Alondria  Viona Gilmore ([id number]) -------------------------------------------------------------------------------- Pain Assessment Details Patient Name: NORAIDA, KHOSRAVI Date of Service: 08/19/2015 10:45 AM Medical Record Patient Account Number: [id number] Number: Treating RN: Baruch Gouty, RN, BSN, Rita 1951-03-18 [phone] y.o. Other Clinician: Date of Birth/Sex: Female) Treating ROBSON, MICHAEL Primary Care Physician/Extender: G PATIENT, NO Physician: Referring Physician: Weeks in Treatment: 32 Active Problems Location of Pain Severity and Description of Pain Patient Has Paino  No Site Locations Pain Management and Medication Current Pain Management: Electronic Signature(s) Signed: 08/19/2015 5:19:00 PM By: Regan Lemming BSN, RN Entered By: Regan Lemming on 08/19/2015 10:51:15 [name] [name] ([id number]) -------------------------------------------------------------------------------- Patient/Caregiver Education Details Patient Name: [name] [name] Date of Service: 08/19/2015 10:45 AM Medical Record Patient Account Number: [id number] Number: Treating RN: Baruch Gouty RN, BSN, Rita Apr 09, 1951 [phone] y.o. Other Clinician: Date of Birth/Gender: Female) Treating ROBSON, MICHAEL Primary Care Physician/Extender: G PATIENT, NO Physician: Weeks in Treatment: 31 Referring Physician: Education Assessment Education Provided To: Patient Education Topics Provided Malignant/Atypical Wounds: Methods: Explain/Verbal Responses: State content correctly Smoking and Wound Healing: Methods: Explain/Verbal Responses: State content correctly Welcome To [facility]: Methods: Explain/Verbal Responses: State content correctly Wound/Skin Impairment: Methods: Explain/Verbal Responses: State content correctly Electronic Signature(s) Signed: 08/19/2015 4:26:18 PM By: Regan Lemming BSN, RN Entered By: Regan Lemming on 08/19/2015 16:26:17 [name] [name] ([id number]) -------------------------------------------------------------------------------- Wound Assessment Details Patient Name: [name] [name] Date of Service: 08/19/2015 10:45 AM Medical Record Patient Account Number: [id number] Number: Treating RN: Baruch Gouty, RN, BSN, Rita [birth year] [phone] y.o. Other Clinician: Date of Birth/Sex: Female) Treating ROBSON, MICHAEL Primary Care Physician/Extender: G PATIENT, NO Physician: Referring Physician: Weeks in Treatment: 67 Wound Status Wound Number: 1 Primary Malignant Wound Etiology: Wound Location: Left Chest Wound Status: Open Wounding Event: Other  Lesion Comorbid Received Chemotherapy, Received Date Acquired: 05/10/2014 History: Radiation Weeks Of Treatment: 57 Clustered Wound: No Photos Photo Uploaded By: Regan Lemming on 08/19/2015 17:16:45 Wound Measurements Length: (cm) 5.5 Width: (cm) 7.3 Depth: (cm) 0.2 Area: (cm) 31.534 Volume: (cm) 6.307 % Reduction in Area: -33.8% % Reduction in Volume: -33.8% Epithelialization: None Tunneling: No Undermining: No Wound Description Full Thickness Without Exposed Classification: Support Structures Wound Margin: Fibrotic scar, thickened scar Exudate Large Amount: Exudate Type: Serosanguineous Exudate Color: red, brown Foul Odor After Cleansing: No Wound Bed Faro, Jolonda W. ([id number]) Granulation Amount: Small (1-33%) Exposed Structure Granulation Quality: Pink, Pale Fascia Exposed: No Necrotic Amount: Large (67-100%) Fat Layer Exposed: No Necrotic Quality: Adherent Slough Tendon Exposed: No Muscle Exposed: No Joint Exposed: No Bone Exposed: No Limited to Skin Breakdown Periwound Skin Texture Texture Color No Abnormalities Noted: No No Abnormalities Noted: Yes Callus: No Temperature / Pain Crepitus: No Temperature: No Abnormality Excoriation: No Tenderness on Palpation: Yes Fluctuance: No Friable: No Induration: Yes Localized Edema: No Rash: No Scarring: Yes Moisture No Abnormalities Noted: Yes Wound Preparation Ulcer Cleansing: Rinsed/Irrigated with Saline Topical Anesthetic Applied: None Treatment Notes Wound #1 (Left Chest) 1. Cleansed with: Clean wound with Normal Saline 4. Dressing Applied: Aquacel Ag 5. Secondary Dressing Applied Bordered Foam Dressing Electronic Signature(s) Signed: 08/19/2015 5:19:00 PM By: Regan Lemming BSN, RN Entered By: Regan Lemming on 08/19/2015 10:58:31 [name] [name] ([id number]) -------------------------------------------------------------------------------- Wound Assessment Details Patient Name: [name] [name] Date of Service: 08/19/2015 10:45 AM Medical Record Patient Account Number: [id number] Number: Treating RN: Baruch Gouty RN, BSN, Rita 02/19/1951 [phone] y.o. Other Clinician: Date of Birth/Sex: Female) Treating ROBSON, MICHAEL Primary Care Physician/Extender: G PATIENT, NO Physician: Referring Physician: Weeks in Treatment: 44 Wound Status Wound Number:  4 Primary Malignant Wound Etiology: Wound Location: Left Chest - Distal Wound Status: Open Wounding Event: Radiation Burn Comorbid Received Chemotherapy, Received Date Acquired: 07/13/2015 History: Radiation Weeks Of Treatment: 5 Clustered Wound: No Photos Photo Uploaded By: Regan Lemming on 08/19/2015 17:16:46 Wound Measurements Length: (cm) 1 Width: (cm) 1 Depth: (cm) 0.1 Area: (cm) 0.785 Volume: (cm) 0.079 % Reduction in Area: 23.1% % Reduction in Volume: 61.3% Epithelialization: None Tunneling: No Undermining: No Wound Description Full Thickness Without Exposed Classification: Support Structures Wound Margin: Fibrotic scar, thickened scar Exudate Medium Amount: Exudate Type: Serosanguineous Exudate Color: red, brown Foul Odor After Cleansing: No Wound Bed Crafts, Taelyn W. ([id number]) Granulation Amount: Large (67-100%) Exposed Structure Granulation Quality: Pink, Pale Fascia Exposed: No Necrotic Amount: Small (1-33%) Fat Layer Exposed: No Necrotic Quality: Adherent Slough Tendon Exposed: No Muscle Exposed: No Joint Exposed: No Bone Exposed: No Limited to Skin Breakdown Periwound Skin Texture Texture Color No Abnormalities Noted: No No Abnormalities Noted: No Callus: No Atrophie Blanche: No Crepitus: No Cyanosis: No Excoriation: No Ecchymosis: No Fluctuance: No Erythema: No Friable: No Hemosiderin Staining: No Induration: No Mottled: No Localized Edema: No Pallor: No Rash: No Rubor: No Scarring: No Temperature / Pain Moisture Temperature: No Abnormality No Abnormalities Noted:  No Tenderness on Palpation: Yes Dry / Scaly: No Maceration: No Moist: Yes Wound Preparation Ulcer Cleansing: Rinsed/Irrigated with Saline Topical Anesthetic Applied: None Treatment Notes Wound #4 (Left, Distal Chest) 1. Cleansed with: Clean wound with Normal Saline 4. Dressing Applied: Aquacel Ag 5. Secondary Dressing Applied Bordered Foam Dressing Electronic Signature(s) Signed: 08/19/2015 5:19:00 PM By: Regan Lemming BSN, RN Entered By: Regan Lemming on 08/19/2015 11:00:01 [name] [name] ([id number]) -------------------------------------------------------------------------------- Vitals Details Patient Name: [name] [name] Date of Service: 08/19/2015 10:45 AM Medical Record Patient Account Number: [id number] Number: Treating RN: Baruch Gouty RN, BSN, Rita Jan 17, 1951 [phone] y.o. Other Clinician: Date of Birth/Sex: Female) Treating ROBSON, MICHAEL Primary Care Physician/Extender: G PATIENT, NO Physician: Referring Physician: Weeks in Treatment: 1 Vital Signs Time Taken: 10:51 Temperature (F): 98.4 Height (in): 68 Pulse (bpm): 99 Weight (lbs): 180.5 Respiratory Rate (breaths/min): 17 Body Mass Index (BMI): 27.4 Blood Pressure (mmHg): 132/69 Reference Range: 80 - 120 mg / dl Electronic Signature(s) Signed: 08/19/2015 5:19:00 PM By: Regan Lemming BSN, RN Entered By: Regan Lemming on 08/19/2015 10:51:49

## 2015-08-21 ENCOUNTER — Inpatient Hospital Stay: Payer: Medicare Other

## 2015-08-21 DIAGNOSIS — C50112 Malignant neoplasm of central portion of left female breast: Secondary | ICD-10-CM

## 2015-08-21 DIAGNOSIS — C50912 Malignant neoplasm of unspecified site of left female breast: Secondary | ICD-10-CM

## 2015-08-21 DIAGNOSIS — C50412 Malignant neoplasm of upper-outer quadrant of left female breast: Secondary | ICD-10-CM | POA: Diagnosis not present

## 2015-08-21 DIAGNOSIS — C792 Secondary malignant neoplasm of skin: Secondary | ICD-10-CM

## 2015-08-21 LAB — CBC WITH DIFFERENTIAL/PLATELET
Basophils Absolute: 0 10*3/uL (ref 0–0.1)
Basophils Relative: 1 %
Eosinophils Absolute: 0 10*3/uL (ref 0–0.7)
Eosinophils Relative: 1 %
HCT: 38.5 % (ref 35.0–47.0)
Hemoglobin: 13 g/dL (ref 12.0–16.0)
Lymphocytes Relative: 47 %
Lymphs Abs: 1.7 10*3/uL (ref 1.0–3.6)
MCH: 28.8 pg (ref 26.0–34.0)
MCHC: 33.8 g/dL (ref 32.0–36.0)
MCV: 85.2 fL (ref 80.0–100.0)
Monocytes Absolute: 0.5 10*3/uL (ref 0.2–0.9)
Monocytes Relative: 13 %
Neutro Abs: 1.4 10*3/uL (ref 1.4–6.5)
Neutrophils Relative %: 38 %
Platelets: 158 10*3/uL (ref 150–440)
RBC: 4.53 MIL/uL (ref 3.80–5.20)
RDW: 16.2 % — ABNORMAL HIGH (ref 11.5–14.5)
WBC: 3.6 10*3/uL (ref 3.6–11.0)

## 2015-08-26 ENCOUNTER — Encounter: Payer: Medicare Other | Admitting: Internal Medicine

## 2015-08-26 DIAGNOSIS — C50812 Malignant neoplasm of overlapping sites of left female breast: Secondary | ICD-10-CM | POA: Diagnosis not present

## 2015-08-26 NOTE — Progress Notes (Addendum)
[name] [name], [name] [name] ([id number]) Visit Report for 08/26/2015 Arrival Information Details Patient Name: [name] [name], [name] [name] Date of Service: 08/26/2015 10:45 AM Medical Record Patient Account Number: [id number] Number: Treating RN: Cornell Barman 08-29-1950 (65 y.o. Other Clinician: Date of Birth/Sex: Female) Treating ROBSON, MICHAEL Primary Care Physician/Extender: G PATIENT, NO Physician: Referring Physician: Weeks in Treatment: 53 Visit Information History Since Last Visit Added or deleted any medications: No Patient Arrived: Ambulatory Any new allergies or adverse reactions: No Arrival Time: 10:46 Had a fall or experienced change in No Accompanied By: self activities of daily living that may affect Transfer Assistance: None risk of falls: Patient Identification Verified: Yes Signs or symptoms of abuse/neglect since last No Secondary Verification Process Yes visito Completed: Pain Present Now: No Patient Requires Transmission-Based No Precautions: Patient Has Alerts: No Electronic Signature(s) Signed: 08/26/2015 11:45:12 AM By: Gretta Cool, RN, BSN, Kim RN, BSN Entered By: Gretta Cool, RN, BSN, Kim on 08/26/2015 10:46:34 [name] [name] ([id number]) -------------------------------------------------------------------------------- Clinic Level of Care Assessment Details Patient Name: [name] [name] Date of Service: 08/26/2015 10:45 AM Medical Record Patient Account Number: [id number] Number: Treating RN: Cornell Barman [birth year] (65 y.o. Other Clinician: Date of Birth/Sex: Female) Treating ROBSON, MICHAEL Primary Care Physician/Extender: G PATIENT, NO Physician: Referring Physician: Weeks in Treatment: 99 Clinic Level of Care Assessment Items TOOL 4 Quantity Score []  - Use when only an EandM is performed on FOLLOW-UP visit 0 ASSESSMENTS - Nursing Assessment / Reassessment X - Reassessment of Co-morbidities (includes updates in patient status) 1 10 X -  Reassessment of Adherence to Treatment Plan 1 5 ASSESSMENTS - Wound and Skin Assessment / Reassessment X - Simple Wound Assessment / Reassessment - one wound 1 5 []  - Complex Wound Assessment / Reassessment - multiple wounds 0 []  - Dermatologic / Skin Assessment (not related to wound area) 0 ASSESSMENTS - Focused Assessment []  - Circumferential Edema Measurements - multi extremities 0 []  - Nutritional Assessment / Counseling / Intervention 0 []  - Lower Extremity Assessment (monofilament, tuning fork, pulses) 0 []  - Peripheral Arterial Disease Assessment (using hand held doppler) 0 ASSESSMENTS - Ostomy and/or Continence Assessment and Care []  - Incontinence Assessment and Management 0 []  - Ostomy Care Assessment and Management (repouching, etc.) 0 PROCESS - Coordination of Care X - Simple Patient / Family Education for ongoing care 1 15 []  - Complex (extensive) Patient / Family Education for ongoing care 0 []  - Staff obtains Consents, Records, Test Results / Process Orders 0 [name], [name] ([id number]) []  - Staff telephones HHA, Nursing Homes / Clarify orders / etc 0 []  - Routine Transfer to another Facility (non-emergent condition) 0 []  - Routine Hospital Admission (non-emergent condition) 0 []  - New Admissions / Biomedical engineer / Ordering NPWT, Apligraf, etc. 0 []  - Emergency Hospital Admission (emergent condition) 0 X - Simple Discharge Coordination 1 10 []  - Complex (extensive) Discharge Coordination 0 PROCESS - Special Needs []  - Pediatric / Minor Patient Management 0 []  - Isolation Patient Management 0 []  - Hearing / Language / Visual special needs 0 []  - Assessment of Community assistance (transportation, D/C planning, etc.) 0 []  - Additional assistance / Altered mentation 0 []  - Support Surface(s) Assessment (bed, cushion, seat, etc.) 0 INTERVENTIONS - Wound Cleansing / Measurement X - Simple Wound Cleansing - one wound 1 5 []  - Complex Wound Cleansing - multiple  wounds 0 X - Wound Imaging (photographs - any number of wounds) 1 5 []  - Wound Tracing (instead of photographs) 0 X - Simple  Wound Measurement - one wound 1 5 []  - Complex Wound Measurement - multiple wounds 0 INTERVENTIONS - Wound Dressings X - Small Wound Dressing one or multiple wounds 1 10 []  - Medium Wound Dressing one or multiple wounds 0 []  - Large Wound Dressing one or multiple wounds 0 []  - Application of Medications - topical 0 []  - Application of Medications - injection 0 [name] [name], [name] INTERVENTIONS - Miscellaneous []  - External ear exam 0 []  - Specimen Collection (cultures, biopsies, blood, body fluids, etc.) 0 []  - Specimen(s) / Culture(s) sent or taken to Lab for analysis 0 []  - Patient Transfer (multiple staff / Harrel Lemon Lift / Similar devices) 0 []  - Simple Staple / Suture removal (25 or less) 0 []  - Complex Staple / Suture removal (26 or more) 0 []  - Hypo / Hyperglycemic Management (close monitor of Blood Glucose) 0 []  - Ankle / Brachial Index (ABI) - do not check if billed separately 0 X - Vital Signs 1 5 Has the patient been seen at the hospital within the last three years: Yes Total Score: 75 Level Of Care: New/Established - Level 2 Electronic Signature(s) Signed: 08/26/2015 11:45:12 AM By: Gretta Cool, RN, BSN, Kim RN, BSN Entered By: Gretta Cool, RN, BSN, Kim on 08/26/2015 11:17:53 [name] [name] ([id number]) -------------------------------------------------------------------------------- Encounter Discharge Information Details Patient Name: [name] [name] Date of Service: 08/26/2015 10:45 AM Medical Record Patient Account Number: [id number] Number: Treating RN: Cornell Barman 1951-02-20 (65 y.o. Other Clinician: Date of Birth/Sex: Female) Treating ROBSON, MICHAEL Primary Care Physician/Extender: G PATIENT, NO Physician: Referring Physician: Weeks in Treatment: 67 Encounter Discharge Information Items Discharge Pain Level: 0 Discharge  Condition: Stable Ambulatory Status: Ambulatory Discharge Destination: Home Transportation: Private Auto Accompanied By: self Schedule Follow-up Appointment: Yes Medication Reconciliation completed Yes and provided to Patient/Care Mckynna Vanloan: Patient Clinical Summary of Care: Declined Electronic Signature(s) Signed: 08/26/2015 3:28:11 PM By: Ruthine Dose Previous Signature: 08/26/2015 11:45:12 AM Version By: Gretta Cool, RN, BSN, Kim RN, BSN Entered By: Ruthine Dose on 08/26/2015 15:28:11 [name] [name] ([id number]) -------------------------------------------------------------------------------- Multi Wound Chart Details Patient Name: [name] [name] Date of Service: 08/26/2015 10:45 AM Medical Record Patient Account Number: [id number] Number: Treating RN: Cornell Barman 1951-03-11 (64 y.o. Other Clinician: Date of Birth/Sex: Female) Treating ROBSON, MICHAEL Primary Care Physician/Extender: G PATIENT, NO Physician: Referring Physician: Weeks in Treatment: 58 Vital Signs Height(in): 68 Pulse(bpm): 104 Weight(lbs): 180.5 Blood Pressure 123/68 (mmHg): Body Mass Index(BMI): 27 Temperature(F): 98.5 Respiratory Rate 16 (breaths/min): Photos: [1:No Photos] [4:No Photos] [N/A:N/A] Wound Location: [1:Left Chest] [4:Left, Distal Chest] [N/A:N/A] Wounding Event: [1:Other Lesion] [4:Radiation Burn] [N/A:N/A] Primary Etiology: [1:Malignant Wound] [4:Malignant Wound] [N/A:N/A] Comorbid History: [1:Received Chemotherapy, Received Radiation] [4:N/A] [N/A:N/A] Date Acquired: [1:05/10/2014] [4:07/13/2015] [N/A:N/A] Weeks of Treatment: [1:58] [4:6] [N/A:N/A] Wound Status: [1:Open] [4:Open] [N/A:N/A] Measurements L x W x D 5.5x7.3x0.2 [4:1x1.2x0.1] [N/A:N/A] (cm) Area (cm) : [1:31.534] [4:0.942] [N/A:N/A] Volume (cm) : [1:6.307] [4:0.094] [N/A:N/A] % Reduction in Area: [1:-33.80%] [4:7.70%] [N/A:N/A] % Reduction in Volume: -33.80% [4:53.90%] [N/A:N/A] Classification: [1:Full  Thickness Without Exposed Support Structures] [4:Full Thickness Without Exposed Support Structures] [N/A:N/A] Exudate Amount: [1:Large] [4:N/A] [N/A:N/A] Exudate Type: [1:Serosanguineous] [4:N/A] [N/A:N/A] Exudate Color: [1:red, brown] [4:N/A] [N/A:N/A] Wound Margin: [1:Fibrotic scar, thickened scar] [4:N/A] [N/A:N/A] Granulation Amount: [1:Small (1-33%)] [4:N/A] [N/A:N/A] Granulation Quality: [1:Pink, Pale] [4:N/A] [N/A:N/A] Necrotic Amount: [1:Large (67-100%)] [4:N/A] [N/A:N/A] Necrotic Tissue: [1:Eschar, Adherent Slough] [4:N/A] [N/A:N/A] Exposed Structures: Fascia: No N/A N/A Fat: No Tendon: No Muscle: No Joint: No Bone: No Limited to Skin Breakdown Epithelialization: None  N/A N/A Periwound Skin Texture: Induration: Yes No Abnormalities Noted N/A Scarring: Yes Edema: No Excoriation: No Callus: No Crepitus: No Fluctuance: No Friable: No Rash: No Periwound Skin No Abnormalities Noted No Abnormalities Noted N/A Moisture: Periwound Skin Color: No Abnormalities Noted No Abnormalities Noted N/A Temperature: No Abnormality N/A N/A Tenderness on Yes No N/A Palpation: Wound Preparation: Ulcer Cleansing: N/A N/A Rinsed/Irrigated with Saline Topical Anesthetic Applied: None Treatment Notes Electronic Signature(s) Signed: 08/26/2015 11:45:12 AM By: Gretta Cool, RN, BSN, Kim RN, BSN Entered By: Gretta Cool, RN, BSN, Kim on 08/26/2015 10:53:27 [name] [name] ([id number]) -------------------------------------------------------------------------------- [place] Details Patient Name: [name] [name] Date of Service: 08/26/2015 10:45 AM Medical Record Patient Account Number: [id number] Number: Treating RN: Cornell Barman Sep 24, 1950 (64 y.o. Other Clinician: Date of Birth/Sex: Female) Treating ROBSON, MICHAEL Primary Care Physician/Extender: G PATIENT, NO Physician: Referring Physician: Weeks in Treatment: 33 Active Inactive Abuse / Safety / Falls / Self  Care Management Nursing Diagnoses: Potential for falls Goals: Patient will remain injury free Date Initiated: 07/11/2014 Goal Status: Active Interventions: Assess fall risk on admission and as needed Notes: Malignancy/Atypical Etiology Nursing Diagnoses: Knowledge deficit related to disease process and management of atypical ulcer etiology Goals: Patient/caregiver will verbalize understanding of disease process and disease management of atypical ulcer etiology Date Initiated: 07/11/2014 Goal Status: Active Interventions: Provide education on atypical ulcer etiologies Notes: Nutrition Nursing Diagnoses: Potential for alteratiion in Nutrition/Potential for imbalanced nutrition [name] [name], HARRALSON. ([id number]) Goals: Patient/caregiver agrees to and verbalizes understanding of need to use nutritional supplements and/or vitamins as prescribed Date Initiated: 07/11/2014 Goal Status: Active Interventions: Assess patient nutrition upon admission and as needed per policy Notes: Orientation to the Wound Care Program Nursing Diagnoses: Knowledge deficit related to the wound healing center program Goals: Patient/caregiver will verbalize understanding of the [facility] Date Initiated: 07/11/2014 Goal Status: Active Interventions: Provide education on orientation to the wound center Notes: Wound/Skin Impairment Nursing Diagnoses: Impaired tissue integrity Goals: Patient will demonstrate a reduced rate of smoking or cessation of smoking Date Initiated: 07/11/2014 Goal Status: Active Patient will have a decrease in wound volume by X% from date: (specify in notes) Date Initiated: 07/11/2014 Goal Status: Active Patient/caregiver will verbalize understanding of skin care regimen Date Initiated: 07/11/2014 Goal Status: Active Ulcer/skin breakdown will have a volume reduction of 30% by week 4 Date Initiated: 07/11/2014 Goal Status: Active Ulcer/skin breakdown will have a  volume reduction of 50% by week 8 Date Initiated: 07/11/2014 [name] [name] ([id number]) Goal Status: Active Ulcer/skin breakdown will have a volume reduction of 80% by week 12 Date Initiated: 07/11/2014 Goal Status: Active Ulcer/skin breakdown will heal within 14 weeks Date Initiated: 07/11/2014 Goal Status: Active Interventions: Assess patient/caregiver ability to obtain necessary supplies Assess patient/caregiver ability to perform ulcer/skin care regimen upon admission and as needed Assess ulceration(s) every visit Provide education on smoking Provide education on ulcer and skin care Screen for HBO Notes: Electronic Signature(s) Signed: 08/26/2015 11:45:12 AM By: Gretta Cool, RN, BSN, Kim RN, BSN Entered By: Gretta Cool, RN, BSN, Kim on 08/26/2015 10:53:20 [name] [name] ([id number]) -------------------------------------------------------------------------------- Pain Assessment Details Patient Name: [name] [name] Date of Service: 08/26/2015 10:45 AM Medical Record Patient Account Number: [id number] Number: Treating RN: Cornell Barman 16-Nov-1950 (64 y.o. Other Clinician: Date of Birth/Sex: Female) Treating ROBSON, MICHAEL Primary Care Physician/Extender: G PATIENT, NO Physician: Referring Physician: Weeks in Treatment: 17 Active Problems Location of Pain Severity and Description of Pain Patient Has Paino No Site Locations Pain Management and  Medication Current Pain Management: Electronic Signature(s) Signed: 08/26/2015 11:45:12 AM By: Gretta Cool, RN, BSN, Kim RN, BSN Entered By: Gretta Cool, RN, BSN, Kim on 08/26/2015 10:47:10 [name] [name] ([id number]) -------------------------------------------------------------------------------- Patient/Caregiver Education Details Patient Name: [name] [name] Date of Service: 08/26/2015 10:45 AM Medical Record Patient Account Number: [id number] Number: Treating RN: Cornell Barman [birth year] (64 y.o. Other Clinician: Date of  Birth/Gender: Female) Treating ROBSON, MICHAEL Primary Care Physician/Extender: G PATIENT, NO Physician: Weeks in Treatment: 77 Referring Physician: Education Assessment Education Provided To: Patient Education Topics Provided Wound/Skin Impairment: Handouts: Caring for Your Ulcer, Other: continue wound care as prescribed Methods: Demonstration Responses: State content correctly Electronic Signature(s) Signed: 08/26/2015 11:45:12 AM By: Gretta Cool, RN, BSN, Kim RN, BSN Entered By: Gretta Cool, RN, BSN, Kim on 08/26/2015 11:21:14 [name] [name] ([id number]) -------------------------------------------------------------------------------- Wound Assessment Details Patient Name: [name] [name] Date of Service: 08/26/2015 10:45 AM Medical Record Patient Account Number: [id number] Number: Treating RN: Cornell Barman May 03, 1950 (64 y.o. Other Clinician: Date of Birth/Sex: Female) Treating ROBSON, MICHAEL Primary Care Physician/Extender: G PATIENT, NO Physician: Referring Physician: Weeks in Treatment: 58 Wound Status Wound Number: 1 Primary Malignant Wound Etiology: Wound Location: Left Chest Wound Status: Open Wounding Event: Other Lesion Comorbid Received Chemotherapy, Received Date Acquired: 05/10/2014 History: Radiation Weeks Of Treatment: 58 Clustered Wound: No Photos Photo Uploaded By: Gretta Cool, RN, BSN, Kim on 08/26/2015 11:44:20 Wound Measurements Length: (cm) 5.5 Width: (cm) 7.3 Depth: (cm) 0.2 Area: (cm) 31.534 Volume: (cm) 6.307 % Reduction in Area: -33.8% % Reduction in Volume: -33.8% Epithelialization: None Wound Description Full Thickness Without Exposed Classification: Support Structures Wound Margin: Fibrotic scar, thickened scar Exudate Large Amount: Exudate Type: Serosanguineous Exudate Color: red, brown Foul Odor After Cleansing: No Wound Bed [name] [name], [name] Granulation Amount: Small (1-33%) Exposed Structure Granulation  Quality: Pink, Pale Fascia Exposed: No Necrotic Amount: Large (67-100%) Fat Layer Exposed: No Necrotic Quality: Eschar, Adherent Slough Tendon Exposed: No Muscle Exposed: No Joint Exposed: No Bone Exposed: No Limited to Skin Breakdown Periwound Skin Texture Texture Color No Abnormalities Noted: No No Abnormalities Noted: Yes Callus: No Temperature / Pain Crepitus: No Temperature: No Abnormality Excoriation: No Tenderness on Palpation: Yes Fluctuance: No Friable: No Induration: Yes Localized Edema: No Rash: No Scarring: Yes Moisture No Abnormalities Noted: Yes Wound Preparation Ulcer Cleansing: Rinsed/Irrigated with Saline Topical Anesthetic Applied: None Treatment Notes Wound #1 (Left Chest) 1. Cleansed with: Clean wound with Normal Saline 4. Dressing Applied: Aquacel Ag 5. Secondary Dressing Applied Bordered Foam Dressing Electronic Signature(s) Signed: 08/26/2015 11:45:12 AM By: Gretta Cool, RN, BSN, Kim RN, BSN Entered By: Gretta Cool, RN, BSN, Kim on 08/26/2015 10:53:04 [name] [name] ([id number]) -------------------------------------------------------------------------------- Wound Assessment Details Patient Name: [name] [name] Date of Service: 08/26/2015 10:45 AM Medical Record Patient Account Number: [id number] Number: Treating RN: Cornell Barman 04-Aug-1950 (64 y.o. Other Clinician: Date of Birth/Sex: Female) Treating ROBSON, MICHAEL Primary Care Physician/Extender: G PATIENT, NO Physician: Referring Physician: Weeks in Treatment: 58 Wound Status Wound Number: 4 Primary Etiology: Malignant Wound Wound Location: Left, Distal Chest Wound Status: Open Wounding Event: Radiation Burn Date Acquired: 07/13/2015 Weeks Of Treatment: 6 Clustered Wound: No Photos Photo Uploaded By: Gretta Cool, RN, BSN, Kim on 08/26/2015 11:44:20 Wound Measurements Length: (cm) 1 Width: (cm) 1.2 Depth: (cm) 0.1 Area: (cm) 0.942 Volume: (cm) 0.094 % Reduction in Area:  7.7% % Reduction in Volume: 53.9% Wound Description Full Thickness Without Exposed Classification: Support Structures Periwound Skin Texture Texture Color No Abnormalities Noted: No No Abnormalities Noted: No Moisture No  Abnormalities Noted: No [name] [name], PIETROPAOLO. ([id number]) Treatment Notes Wound #4 (Left, Distal Chest) 1. Cleansed with: Clean wound with Normal Saline 4. Dressing Applied: Aquacel Ag 5. Secondary Dressing Applied Bordered Foam Dressing Electronic Signature(s) Signed: 08/26/2015 11:45:12 AM By: Gretta Cool, RN, BSN, Kim RN, BSN Entered By: Gretta Cool, RN, BSN, Kim on 08/26/2015 10:51:49 [name] [name] ([id number]) -------------------------------------------------------------------------------- [place] Details Patient Name: [name] [name] Date of Service: 08/26/2015 10:45 AM Medical Record Patient Account Number: [id number] Number: Treating RN: Cornell Barman Nov 16, 1950 (64 y.o. Other Clinician: Date of Birth/Sex: Female) Treating ROBSON, MICHAEL Primary Care Physician/Extender: G PATIENT, NO Physician: Referring Physician: Weeks in Treatment: 64 Vital Signs Time Taken: 10:47 Temperature (F): 98.5 Height (in): 68 Pulse (bpm): 104 Weight (lbs): 180.5 Respiratory Rate (breaths/min): 16 Body Mass Index (BMI): 27.4 Blood Pressure (mmHg): 123/68 Reference Range: 80 - 120 mg / dl Electronic Signature(s) Signed: 08/26/2015 11:45:12 AM By: Gretta Cool, RN, BSN, Kim RN, BSN Entered By: Gretta Cool, RN, BSN, Kim on 08/26/2015 10:47:29

## 2015-08-27 NOTE — Progress Notes (Signed)
ALLAYNA, ERLICH ([id number]) Visit Report for 08/26/2015 Chief Complaint Document Details Patient Name: [name] [name], [name] [name] Date of Service: 08/26/2015 10:45 AM Medical Record Patient Account Number: [id number] Number: Treating RN: Cornell Barman [birth year] (65 y.o. Other Clinician: Date of Birth/Sex: Female) Treating ROBSON, MICHAEL Primary Care Physician/Extender: G PATIENT, NO Physician: Referring Physician: Weeks in Treatment: 55 Information Obtained from: Patient Chief Complaint Patient presents to the wound care center for a consult due non healing wound patient is a self-referral who comes with a history of having open wounds to her left chest wall and right chest wall for about 2 months. 07/18/2014 -- since last week the patient has had no complaints and is on her last cycle of chemotherapy. I have reviewed 155 pages of her reports from a medical oncologist and the summary is made in the history of present illness. Electronic Signature(s) Signed: 08/27/2015 7:56:10 AM By: Linton Ham MD Entered By: Linton Ham on 08/26/2015 12:01:08 [name] ([id number]) -------------------------------------------------------------------------------- HPI Details Patient Name: [name] Date of Service: 08/26/2015 10:45 AM Medical Record Patient Account Number: [id number] Number: Treating RN: Cornell Barman Mar 15, 1951 (65 y.o. Other Clinician: Date of Birth/Sex: Female) Treating ROBSON, MICHAEL Primary Care Physician/Extender: G PATIENT, NO Physician: Referring Physician: Weeks in Treatment: 13 History of Present Illness HPI Description: this 65 year old patient has come by herself today as a self-referral and has no documentation with her. She has had open wounds to her left chest and the right chest wall for about 2 months. She is not a diabetic and has no significant medical history except breast cancer. Her right breast cancer was diagnosed 18  years ago and she had a mastectomy and chemotherapy. 3 years ago she was then diagnosed with a breast cancer of the left side which was advanced and could not be operated and was given chemotherapy for a year and then followed by radiation. Her last radiation was over a year ago. She has not had any biopsy done recently from the ulcerated area but she says there have been other biopsies done from the chest wall and these reports are not available at the present time. She was told to keep the wounds open and let them dry out but she is finding it's more and more difficult to stop soiling her clothes if she leaves wounds open. She is on chemotherapy and we will try and obtain these notes from her oncologist. She is not in pain and does not have any significant problems except the discomfort from an open wound on the chest wall. 07/18/2014 This is a summary of Jinelle Butchko date of birth 07-09-1950. These are obtained by reviewing 151 pages of medical records which were obtained from the [facility] 65 year old patient who had a history of stage I right breast cancer status post modified radical mastectomy on 06/12/1996. 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 She received 4 cycles of AC at [facility]. In January 2014 she presented with left breast swelling and apparently cellulitis. Breast biopsy done revealed lobular carcinoma which was ER and PR positive and HER-2/neu negative. On PET scan and she was shown to have multiple areas of disease left breast, axilla and supraclavicular areas. She received 6 cycles of chemotherapy and then received Femara because she was not a surgical candidate. PET scan also revealed disease in the left lateral breast and new cervical lymph nodes at level II. The patient was then sent for  radiation therapy and received this over a month. In January 2016 she had a PET CT scan done which was compatible  with marked progression of disease with multifocal soft tissue masses in the chest wall left greater than right. She is currently receiving Xeloda and is noted to have disease in the chest wall which is widely present. Her most recent lab work from 07/02/2014 shows that her CMP is within normal limits with a serum albumin of 4.4. The previous CBC was within normal limits with a WBC count of 4.3 hemoglobin of 14 hematocrit of 40.9 and platelets of 202. 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 We will continue with palliative care and make her comfortable as much as possible as far as her wound care goes. Present time there is no odor but if this does occur we would use carbon- Volkert, Rokia W. ([id number]) based products to mask the odor. Notes were also reviewed from her surgeon Dr. Hervey Ard --closure on 05/27/2014 for breast biopsy in view of the fact that she had extensive disease on her chest wall. Biopsies are taken from the nodular metastatic disease overlying the sternum and nodules chosen for biopsy. Punch biopsies were taken and the pathology on this confirmed that this was consistent with carcinoma 08/15/2014 -- she is started on a new course of injectable chemotherapy and this is 2 weeks on and 2 weeks off. She has no new symptoms no bleeding from the wounds nor is there any odor. She is doing fine otherwise. 09/16/2014. She is doing very well and the right side wounds have completely healed. She has finished 2 cycles of chemotherapy and she is 2 weeks on and 2 weeks off. He is to restart her chemotherapy this week. No fresh complaints pain is within control and there is no odor from the wounds. 10/21/2014 -- overall she is doing very well and is on a clinical trial with her  medical oncologist. No issues with the wound on her left side and there is no odor or drainage. 11/25/2014 -- she continues to be under clinical trial protocol with the medical oncologist and has no fresh issues. 02/17/2015 -- she has been taken off the clinical trial protocol and now is on Taxol on a regular basis with her medical oncologists Dr. Susy Manor. 05/20/15; the patient follow see her for a nonhealing area on the left lateral chest. 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 She has been using Aquacel Ag on this area on a palliative basis. No debridement has been done. 06/17/15; the patient follows in this clinic monthly for a nonhealing area on her left lateral chest. She has known metastatic breast cancer which is metastatic to the chest wall. She returns today with a cluster of open areas on the lateral aspect of her chest. The nurses in the clinic described this as a large open area that progressively closed over. I've taken this opportunity to review her oncology status. On her arrival here she asked me to debridement the wounds on her chest stating that this was a request of [name] her medical oncologist. This is a patient who initially developed stage I right breast cancer treated with a modified radical mastectomy in 1998. She really presented with  inflammatory breast cancer in 2014 post chemotherapy biopsy was positive she was not a surgical candidate. Since then she has been treated with multiple regimens. Most particular to her wounds PET scan in January 2016 revealed marked progression of the disease with multifocal masses in the chest wall left greater than right. 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 Left chest wall biopsy on 04/29/15 revealed metastatic  carcinoma consistent with invasive lobular carcinoma. She has been receiving Faslodex. Weekly. She missed her last injection and is attributing this to the breakdown of tissue on her left chest wall. She was seen by [name] in early March however the record is not complete 07/16/15; patient arrives for her monthly visit the. I've reviewed her oncologist notes Dr. Nolon Stalls. Her notes for the increasing nodularity in the chest wall look. Her pain seems adequately controlled with tramadol. The measurements of the necrotic areas seem to be increasing. They are covered with a thick surface slough however I don't believe the debridement is indicated here. She is dressing knees with Aquacel Ag and foam 08/12/15; this is a patient I follow monthly. Her primary oncologist is [name] of the local oncology clinic. The patient lives in [place]. Unfortunately she has expanding necrotic mass in her left anterior chest which is no doubt malignant wounds from her known metastatic breast cancer. She also has a lot of tenderness on the lateral aspect of this wound which might be cellulitis that she appears to have some form of drainage. I have done a culture although this also could be from expanding necrotic tumor 08/19/15; the patient is here for review of culture reports. At the far left area of her necrotic wound was SAKEENAH, VALCARCEL. ([id number]) purulent drainage last week. This grew both Citrobacter Koseri and MRSA. The patient does not feel systemically unwell although she is still having pain in the area. No fever. She is now on Ibrance for her metastatic breast cancer 08/26/15; patient is just about completed her antibiotics. There is less drainage but still some tenderness on the lateral aspect of her necrotic chest wall wound. She has not been systemically unwell Electronic Signature(s) Signed: 08/27/2015 7:56:10 AM By: Linton Ham MD Entered By: Linton Ham on 08/26/2015  12:02:56 [name] ([id number]) -------------------------------------------------------------------------------- Physical Exam Details Patient Name: [name] Date of Service: 08/26/2015 10:45 AM Medical Record Patient Account Number: [id number] Number: Treating RN: Cornell Barman 11-02-1950 (64 y.o. Other Clinician: Date of Birth/Sex: Female) Treating ROBSON, MICHAEL Primary Care Physician/Extender: G PATIENT, NO Physician: Referring Physician: Weeks in Treatment: 58 Notes Wound exam; a necrotic expanding wound across the chest. Multiple nodules that are likely malignant. She has less tenderness on the lateral chest and no purulent drainage is noted Electronic Signature(s) Signed: 08/27/2015 7:56:10 AM By: Linton Ham MD Entered By: Linton Ham on 08/26/2015 12:03:30 [name] ([id number]) -------------------------------------------------------------------------------- Physician Orders Details Patient Name: [name] Date of Service: 08/26/2015 10:45 AM Medical Record Patient Account Number: [id number] Number: Treating RN: Cornell Barman 19-Mar-1951 (64 y.o. Other Clinician: Date of Birth/Sex: Female) Treating ROBSON, MICHAEL Primary Care Physician/Extender: G PATIENT, NO Physician: Referring Physician: Weeks in Treatment: 76 Verbal / Phone Orders: Yes Clinician: Cornell Barman Read Back and Verified: Yes Diagnosis Coding Wound Cleansing Wound #1 Left Chest o Cleanse wound with mild soap and water o May Shower, gently pat wound dry  prior to applying new dressing. o May shower with protection. Wound #4 Left,Distal Chest o Cleanse wound with mild soap and water o May Shower, gently pat wound dry prior to applying new dressing. o May shower with protection. Primary Wound Dressing Wound #1 Left Chest o Aquacel Ag Wound #4 Left,Distal Chest o Aquacel Ag Secondary Dressing Wound #1 Left Chest o Boardered  Foam Dressing Wound #4 Left,Distal Chest o Boardered Foam Dressing Dressing Change Frequency Wound #1 Left Chest o Change dressing every other day. - or more if needed Wound #4 Left,Distal Chest o Change dressing every other day. - or more if needed Follow-up Appointments RANDAL, YEPIZ. ([id number]) Wound #1 Left Chest o Return Appointment in 1 week. Wound #4 Left,Distal Chest o Return Appointment in 1 week. Medications-please add to medication list. Wound #1 Left Chest o P.O. Antibiotics - conitinue Cefdinir 310m every 12 hrs for 10 days o P.O. Antibiotics - continue Doxycycline 1088mBID for 10 days by mouth Wound #4 Left,Distal Chest o P.O. Antibiotics - conitinue Cefdinir 30088mvery 12 hrs for 10 days o P.O. Antibiotics - continue Doxycycline 100m53mD for 10 days by mouth Electronic Signature(s) Signed: 08/26/2015 11:45:12 AM By: WoodGretta Cool BSN, Kim RN, BSN Signed: 08/27/2015 7:56:10 AM By: RobsLinton HamEntered By: WoodGretta Cool, BSN, Kim on 08/26/2015 11:17:26 GOODTIMARA, [id number]------------------------------------------------------------------------------ Prescription 08/26/2015 Patient Name: GOODEvlyn Couriersician: ROBSRicard DillonDate of Birth: 11/1January 07, 1952#: [id number]: F DEA#: [id number] #: [id number] #: [id number] Address: [place] [id number] [phone] sulfa Reaction: vomiting Severity: Mild Physician's Orders P.O. Antibiotics - conitinue Cefdinir 300mg16mry 12 hrs for 10 days Signature(s): Date(s): Electronic Signature(s) Signed: 08/27/2015 7:56:10 AM By: RobsoLinton Hamrevious Signature: 08/26/2015 11:45:12 AM Version By: WoodyGretta CoolBSN, Kim RN, BSN Entered By: RobsoLinton Ham5/16/2017  12:04:56 GOODMEvlyn [id number]-----------------------------------------------------------------------------  Problem List Details Patient Name: GOODMEvlyn Courier of Service: 08/26/2015 10:45 AM Medical Record Patient Account Number: [id number]: Treating RN: WoodyCornell Barman812-20[birth year]65y.o. Other Clinician: Date of Birth/Sex: Female) Treating ROBSON, MICHAEL Primary Care Physician/Extender: G PATIENT, NO Physician: Referring Physician: Weeks in Treatment: 58 Ac23ve Problems ICD-10 Encounter Code Description Active Date Diagnosis C50.812 Malignant neoplasm of overlapping sites of left female 07/11/2014 Yes breast S21.001A Unspecified open wound of right breast, initial encounter 07/11/2014 Yes C50.412 Malignant neoplasm of upper-outer quadrant of left female 07/11/2014 Yes breast S21.002A Unspecified open wound of left breast, initial encounter 07/11/2014 Yes Inactive Problems Resolved Problems Electronic Signature(s) Signed: 08/27/2015 7:56:10 AM By: RobsoLinton Hamntered By: RobsoLinton Ham5/16/2017 12:00:56 GOODMEvlyn [id number]----------------------------------------------------------------------------- Progress Note Details Patient Name: GOODMEvlyn Courier of Service: 08/26/2015 10:45 AM Medical Record Patient Account Number: [id number]: Treating RN: WoodyCornell Barman[birth year]-65y.o. Other Clinician: Date of Birth/Sex: Female) Treating ROBSON, MICHAEL Primary Care Physician/Extender: G PATIENT, NO Physician: Referring Physician: Weeks in Treatment: 58 Su70ective Chief Complaint Information obtained from Patient Patient presents to the wound care center for a consult due non healing wound patient is a self-referral who comes with a history of having open wounds to her left chest wall and right chest wall for about 2 months. 07/18/2014 -- since last week the patient has had no complaints and is on  her last cycle  of chemotherapy. I have reviewed 155 pages of her reports from a medical oncologist and the summary is made in the history of present illness. History of Present Illness (HPI) this 65 year old patient has come by herself today as a self-referral and has no documentation with her. She has had open wounds to her left chest and the right chest wall for about 2 months. She is not a diabetic and has no significant medical history except breast cancer. Her right breast cancer was diagnosed 18 years ago and she had a mastectomy and chemotherapy. 3 years ago she was then diagnosed with a breast cancer of the left side which was advanced and could not be operated and was given chemotherapy for a year and then followed by radiation. Her last radiation was over a year ago. She has not had any biopsy done recently from the ulcerated area but she says there have been other biopsies done from the chest wall and these reports are not available at the present time. She was told to keep the wounds open and let them dry out but she is finding it's more and more difficult to stop soiling her clothes if she leaves wounds open. She is on chemotherapy and we will try and obtain these notes from her oncologist. She is not in pain and does not have any significant problems except the discomfort from an open wound on the chest wall. 07/18/2014 This is a summary of Hasina Kreager date of birth March 31, 1951. These are obtained by reviewing 151 pages of medical records which were obtained from the [facility] 65 year old patient who had a history of stage I right breast cancer status post modified radical mastectomy on 06/12/1996. 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 She received 4 cycles of AC at [facility]. In January 2014 she presented with left breast swelling and apparently cellulitis. Breast biopsy done revealed lobular carcinoma which  was ER and PR positive and HER-2/neu negative. On PET scan and she was shown to have multiple areas of disease left breast, axilla and supraclavicular areas. She received 6 cycles of chemotherapy and then received Femara because she was not a surgical candidate. PET scan also revealed disease in the left lateral breast and new cervical lymph nodes at level II. The patient was then sent for radiation therapy and received this over a month. In January 2016 she had a PET CT scan done which was compatible with marked progression of disease Moffet, Saphronia W. ([id number]) with multifocal soft tissue masses in the chest wall left greater than right. She is currently receiving Xeloda and is noted to have disease in the chest wall which is widely present. Her most recent lab work from 07/02/2014 shows that her CMP is within normal limits with a serum albumin of 4.4. The previous CBC was within normal limits with a WBC count of 4.3 hemoglobin of 14 hematocrit of 40.9 and platelets of 202. 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 We will continue with palliative care and make her comfortable as much as possible as far as her wound care goes. Present time there is no odor but if this does occur we would use carbon- based products to mask the odor. Notes were also reviewed from her surgeon  Dr. Hervey Ard --closure on 05/27/2014 for breast biopsy in view of the fact that she had extensive disease on her chest wall. Biopsies are taken from the nodular metastatic disease overlying the sternum and nodules chosen for biopsy. Punch biopsies were taken and the pathology on this confirmed that this was consistent with carcinoma 08/15/2014 -- she is started on a new course of injectable chemotherapy and this is 2  weeks on and 2 weeks off. She has no new symptoms no bleeding from the wounds nor is there any odor. She is doing fine otherwise. 09/16/2014. She is doing very well and the right side wounds have completely healed. She has finished 2 cycles of chemotherapy and she is 2 weeks on and 2 weeks off. He is to restart her chemotherapy this week. No fresh complaints pain is within control and there is no odor from the wounds. 10/21/2014 -- overall she is doing very well and is on a clinical trial with her medical oncologist. No issues with the wound on her left side and there is no odor or drainage. 11/25/2014 -- she continues to be under clinical trial protocol with the medical oncologist and has no fresh issues. 02/17/2015 -- she has been taken off the clinical trial protocol and now is on Taxol on a regular basis with her medical oncologists Dr. Susy Manor. 05/20/15; the patient follow see her for a nonhealing area on the left lateral chest. 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 She has been using Aquacel Ag on this area on a palliative basis. No debridement has been done. 06/17/15; the patient follows in this clinic monthly for a nonhealing area on her left lateral chest. She has known metastatic breast cancer which is metastatic to the chest wall. She returns today with a cluster of open areas on the lateral aspect of her chest. The nurses in the clinic described this as a large open area that progressively closed over. I've taken this opportunity to review her oncology status. On her arrival here she asked me to debridement the wounds on her chest stating that this was a request of [name] her medical oncologist. This is a patient who initially developed stage I right breast cancer treated with a modified radical mastectomy in 1998. She really presented with inflammatory breast cancer in 2014 post chemotherapy biopsy was  positive she was not a surgical candidate. Since then she has been treated with multiple regimens. Most particular to her wounds PET scan in January 2016 revealed marked progression of the disease with multifocal masses in the chest wall left greater than right. 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 She has been receiving Faslodex. Weekly. She missed her last injection and is attributing this to the breakdown of tissue on her left chest wall. She was seen by [name] in early March however the record is not complete 07/16/15; patient arrives for her monthly visit the. I've reviewed her oncologist notes Dr. Nolon Stalls. Her notes for the increasing nodularity in the chest wall look. Her pain seems adequately controlled with MELINDA, GWINNER. ([id number]) tramadol. The measurements of the necrotic areas seem to be increasing. They are covered with a thick surface slough however I don't believe the debridement is  indicated here. She is dressing knees with Aquacel Ag and foam 08/12/15; this is a patient I follow monthly. Her primary oncologist is [name] of the local oncology clinic. The patient lives in [place]. Unfortunately she has expanding necrotic mass in her left anterior chest which is no doubt malignant wounds from her known metastatic breast cancer. She also has a lot of tenderness on the lateral aspect of this wound which might be cellulitis that she appears to have some form of drainage. I have done a culture although this also could be from expanding necrotic tumor 08/19/15; the patient is here for review of culture reports. At the far left area of her necrotic wound was purulent drainage last week. This grew both Citrobacter Koseri  and MRSA. The patient does not feel systemically unwell although she is still having pain in the area. No fever. She is now on Ibrance for her metastatic breast cancer 08/26/15; patient is just about completed her antibiotics. There is less drainage but still some tenderness on the lateral aspect of her necrotic chest wall wound. She has not been systemically unwell Objective Constitutional Vitals Time Taken: 10:47 AM, Height: 68 in, Weight: 180.5 lbs, BMI: 27.4, Temperature: 98.5 F, Pulse: 104 bpm, Respiratory Rate: 16 breaths/min, Blood Pressure: 123/68 mmHg. Integumentary (Hair, Skin) Wound #1 status is Open. Original cause of wound was Other Lesion. The wound is located on the Left Chest. The wound measures 5.5cm length x 7.3cm width x 0.2cm depth; 31.534cm^2 area and 6.307cm^3 volume. The wound is limited to skin breakdown. There is a large amount of serosanguineous drainage noted. The wound margin is fibrotic, thickened scar. There is small (1-33%) pink, pale granulation within the wound bed. There is a large (67-100%) amount of necrotic tissue within the wound bed including Eschar and Adherent Slough. The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

## 2015-09-09 ENCOUNTER — Encounter: Payer: Self-pay | Admitting: Hematology and Oncology

## 2015-09-13 ENCOUNTER — Emergency Department
Admission: EM | Admit: 2015-09-13 | Discharge: 2015-09-13 | Disposition: A | Discharge status: Home / Self Care | Payer: Medicare Other | Attending: Emergency Medicine | Admitting: Emergency Medicine

## 2015-09-13 DIAGNOSIS — Z79899 Other long term (current) drug therapy: Secondary | ICD-10-CM | POA: Insufficient documentation

## 2015-09-13 DIAGNOSIS — F17210 Nicotine dependence, cigarettes, uncomplicated: Secondary | ICD-10-CM | POA: Diagnosis not present

## 2015-09-13 DIAGNOSIS — R42 Dizziness and giddiness: Secondary | ICD-10-CM

## 2015-09-13 DIAGNOSIS — J069 Acute upper respiratory infection, unspecified: Secondary | ICD-10-CM | POA: Insufficient documentation

## 2015-09-13 DIAGNOSIS — Z853 Personal history of malignant neoplasm of breast: Secondary | ICD-10-CM | POA: Diagnosis not present

## 2015-09-13 LAB — CBC
HEMATOCRIT: 35.7 % (ref 35.0–47.0)
HEMOGLOBIN: 12.1 g/dL (ref 12.0–16.0)
MCH: 29.6 pg (ref 26.0–34.0)
MCHC: 34 g/dL (ref 32.0–36.0)
MCV: 87 fL (ref 80.0–100.0)
Platelets: 144 10*3/uL — ABNORMAL LOW (ref 150–440)
RBC: 4.1 MIL/uL (ref 3.80–5.20)
RDW: 17.7 % — ABNORMAL HIGH (ref 11.5–14.5)
WBC: 2.8 10*3/uL — AB (ref 3.6–11.0)

## 2015-09-13 LAB — BASIC METABOLIC PANEL
ANION GAP: 10 (ref 5–15)
BUN: 11 mg/dL (ref 6–20)
CHLORIDE: 103 mmol/L (ref 101–111)
CO2: 26 mmol/L (ref 22–32)
Calcium: 9.4 mg/dL (ref 8.9–10.3)
Creatinine, Ser: 0.8 mg/dL (ref 0.44–1.00)
GFR calc non Af Amer: >60 mL/min (ref >60)
Glucose, Bld: 103 mg/dL — ABNORMAL HIGH (ref 65–99)
POTASSIUM: 4.4 mmol/L (ref 3.5–5.1)
SODIUM: 139 mmol/L (ref 135–145)

## 2015-09-13 LAB — URINALYSIS COMPLETE WITH MICROSCOPIC (ARMC ONLY)
BILIRUBIN URINE: NEGATIVE
Bacteria, UA: NONE SEEN
GLUCOSE, UA: NEGATIVE mg/dL
Hgb urine dipstick: NEGATIVE
KETONES UR: NEGATIVE mg/dL
Leukocytes, UA: NEGATIVE
NITRITE: NEGATIVE
PROTEIN: 30 mg/dL — AB
Specific Gravity, Urine: 1.015 (ref 1.005–1.030)
pH: 6 (ref 5.0–8.0)
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
## 2015-09-13 NOTE — ED Notes (Addendum)
Pt reports being a cancer pt and taking oral Ibrance that she takes daily for cancer. States that she feels dizzy because the medication and she has cold symptoms and fever that started yesterday.

## 2015-09-13 NOTE — ED Notes (Addendum)
IV unsuccessful. One set of blood culture and blood work sent to lab.

## 2015-09-13 NOTE — ED Notes (Signed)
Lab notifies need new green and purple top. Will await room to access port.

## 2015-09-15 ENCOUNTER — Telehealth: Payer: Self-pay | Admitting: *Deleted

## 2015-09-15 NOTE — Telephone Encounter (Signed)
Left message with patient that [name] wants to evaluate her in the [place] [facility] on Wednesday at 1:30pm. Informed pt that will try to callback this afternoon to see how she was feeling since her ED visit over the weekend.

## 2015-09-15 NOTE — Telephone Encounter (Signed)
Attempted to call pt back but pt did not answer. Left voicemail to remind pt of appt on Wednesday in [place] at 1:30pm. Instructed pt to callback if has any other questions, concerns, or if needs to talk to RN prior to appt on Wednesday.

## 2015-09-17 ENCOUNTER — Encounter: Payer: Self-pay | Admitting: Hematology and Oncology

## 2015-09-17 ENCOUNTER — Inpatient Hospital Stay: Payer: Medicare Other

## 2015-09-17 ENCOUNTER — Inpatient Hospital Stay: Payer: Medicare Other | Attending: Hematology and Oncology | Admitting: Hematology and Oncology

## 2015-09-17 DIAGNOSIS — C50412 Malignant neoplasm of upper-outer quadrant of left female breast: Secondary | ICD-10-CM | POA: Diagnosis not present

## 2015-09-17 DIAGNOSIS — F17210 Nicotine dependence, cigarettes, uncomplicated: Secondary | ICD-10-CM | POA: Insufficient documentation

## 2015-09-17 DIAGNOSIS — C50912 Malignant neoplasm of unspecified site of left female breast: Secondary | ICD-10-CM

## 2015-09-17 DIAGNOSIS — Z853 Personal history of malignant neoplasm of breast: Secondary | ICD-10-CM

## 2015-09-17 DIAGNOSIS — Z79811 Long term (current) use of aromatase inhibitors: Secondary | ICD-10-CM | POA: Diagnosis not present

## 2015-09-17 DIAGNOSIS — Z9221 Personal history of antineoplastic chemotherapy: Secondary | ICD-10-CM | POA: Diagnosis not present

## 2015-09-17 DIAGNOSIS — Z170 Estrogen receptor positive status [ER+]: Secondary | ICD-10-CM | POA: Insufficient documentation

## 2015-09-17 DIAGNOSIS — C792 Secondary malignant neoplasm of skin: Secondary | ICD-10-CM

## 2015-09-17 DIAGNOSIS — C50112 Malignant neoplasm of central portion of left female breast: Secondary | ICD-10-CM

## 2015-09-17 DIAGNOSIS — Z79899 Other long term (current) drug therapy: Secondary | ICD-10-CM

## 2015-09-17 LAB — CBC WITH DIFFERENTIAL/PLATELET
Basophils Absolute: 0 10*3/uL (ref 0–0.1)
Basophils Relative: 1 %
Eosinophils Absolute: 0 10*3/uL (ref 0–0.7)
Eosinophils Relative: 0 %
HCT: 35.9 % (ref 35.0–47.0)
Hemoglobin: 11.9 g/dL — ABNORMAL LOW (ref 12.0–16.0)
Lymphocytes Relative: 34 %
Lymphs Abs: 1.2 10*3/uL (ref 1.0–3.6)
MCH: 29.5 pg (ref 26.0–34.0)
MCHC: 33.2 g/dL (ref 32.0–36.0)
MCV: 89 fL (ref 80.0–100.0)
Monocytes Absolute: 0.6 10*3/uL (ref 0.2–0.9)
Monocytes Relative: 15 %
Neutro Abs: 1.8 10*3/uL (ref 1.4–6.5)
Neutrophils Relative %: 50 %
Platelets: 151 10*3/uL (ref 150–440)
RBC: 4.04 MIL/uL (ref 3.80–5.20)
RDW: 18.2 % — ABNORMAL HIGH (ref 11.5–14.5)
WBC: 3.7 10*3/uL (ref 3.6–11.0)

## 2015-09-17 LAB — COMPREHENSIVE METABOLIC PANEL
ALT: 19 U/L (ref 14–54)
AST: 21 U/L (ref 15–41)
Albumin: 3.7 g/dL (ref 3.5–5.0)
Alkaline Phosphatase: 90 U/L (ref 38–126)
Anion gap: 10 (ref 5–15)
BUN: 14 mg/dL (ref 6–20)
CO2: 28 mmol/L (ref 22–32)
Calcium: 9.3 mg/dL (ref 8.9–10.3)
Chloride: 101 mmol/L (ref 101–111)
Creatinine, Ser: 0.99 mg/dL (ref 0.44–1.00)
GFR calc Af Amer: >60 mL/min (ref >60)
GFR calc non Af Amer: 60 mL/min — ABNORMAL LOW (ref >60)
Glucose, Bld: 98 mg/dL (ref 65–99)
Potassium: 3.7 mmol/L (ref 3.5–5.1)
Sodium: 139 mmol/L (ref 135–145)
Total Bilirubin: 0.3 mg/dL (ref 0.3–1.2)
Total Protein: 8 g/dL (ref 6.5–8.1)

## 2015-09-17 NOTE — Progress Notes (Signed)
Eating ok,  Bowels are ok but stools are hard and advised ehr to take stool softner once or twice a day. No dizziness. Had some cold sx. Still having drainage that is clear. No fevers.

## 2015-09-18 LAB — CANCER ANTIGEN 27.29: CA 27.29: 1031 U/mL — ABNORMAL HIGH (ref 0.0–38.6)

## 2015-09-19 ENCOUNTER — Telehealth: Payer: Self-pay | Admitting: *Deleted

## 2015-09-19 ENCOUNTER — Inpatient Hospital Stay: Payer: Medicare Other | Admitting: Hematology and Oncology

## 2015-09-19 ENCOUNTER — Inpatient Hospital Stay: Payer: Medicare Other

## 2015-09-19 DIAGNOSIS — C50112 Malignant neoplasm of central portion of left female breast: Secondary | ICD-10-CM

## 2015-09-19 DIAGNOSIS — C792 Secondary malignant neoplasm of skin: Principal | ICD-10-CM

## 2015-09-19 DIAGNOSIS — C50912 Malignant neoplasm of unspecified site of left female breast: Secondary | ICD-10-CM

## 2015-09-19 DIAGNOSIS — C50412 Malignant neoplasm of upper-outer quadrant of left female breast: Secondary | ICD-10-CM | POA: Diagnosis not present

## 2015-09-19 MED ORDER — TRAMADOL HCL 50 MG PO TABS: 50.0000 mg | ORAL_TABLET | Freq: Three times a day (TID) | ORAL | Status: DC | PRN

## 2015-09-19 MED ORDER — FULVESTRANT 250 MG/5ML IM SOLN
500.0000 mg | INTRAMUSCULAR | Status: DC
  Administered 2015-09-19: 500 mg via INTRAMUSCULAR
  Filled 2015-09-19: qty 10

## 2015-09-19 NOTE — Telephone Encounter (Signed)
Acknowledged by Dr Mike Gip, I called pt to be sure she is not taking any aspirin or OTC analgesics, she stated she is not.

## 2015-09-19 NOTE — Telephone Encounter (Signed)
Called to report that she had a bit of a nose bleed this morning, not bad. Instructed her that her platelet count was nml yesterday and if it continues to call back or she gets a bleed that will not stop to go to the ER.

## 2015-09-19 NOTE — Telephone Encounter (Signed)
Pt was seen on wed. And would need refill of pain med. She is coming in today and med has been refilled and downstairs for patient to pick up.

## 2015-09-22 ENCOUNTER — Other Ambulatory Visit: Payer: Self-pay | Admitting: *Deleted

## 2015-09-22 DIAGNOSIS — C50912 Malignant neoplasm of unspecified site of left female breast: Secondary | ICD-10-CM

## 2015-09-22 DIAGNOSIS — C792 Secondary malignant neoplasm of skin: Secondary | ICD-10-CM

## 2015-09-23 ENCOUNTER — Encounter: Payer: Medicare Other | Attending: Internal Medicine | Admitting: Internal Medicine

## 2015-09-23 ENCOUNTER — Encounter: Payer: Self-pay | Admitting: *Deleted

## 2015-09-23 DIAGNOSIS — X58XXXA Exposure to other specified factors, initial encounter: Secondary | ICD-10-CM | POA: Insufficient documentation

## 2015-09-23 DIAGNOSIS — I959 Hypotension, unspecified: Secondary | ICD-10-CM | POA: Insufficient documentation

## 2015-09-23 DIAGNOSIS — C50812 Malignant neoplasm of overlapping sites of left female breast: Secondary | ICD-10-CM | POA: Insufficient documentation

## 2015-09-23 DIAGNOSIS — S21002A Unspecified open wound of left breast, initial encounter: Secondary | ICD-10-CM | POA: Diagnosis not present

## 2015-09-23 DIAGNOSIS — C50412 Malignant neoplasm of upper-outer quadrant of left female breast: Secondary | ICD-10-CM | POA: Diagnosis not present

## 2015-09-23 DIAGNOSIS — S21001A Unspecified open wound of right breast, initial encounter: Secondary | ICD-10-CM | POA: Diagnosis not present

## 2015-09-23 DIAGNOSIS — Z9221 Personal history of antineoplastic chemotherapy: Secondary | ICD-10-CM | POA: Diagnosis not present

## 2015-09-23 NOTE — Progress Notes (Signed)
[name] [name], [name] [name] ([id number]) Visit Report for 09/23/2015 Arrival Information Details Patient Name: ASENCION, DEMEESTER Date of Service: 09/23/2015 10:45 AM Medical Record Patient Account Number: [id number] Number: Treating RN: Baruch Gouty, RN, BSN, Rita [birth year] [phone] y.o. Other Clinician: Date of Birth/Sex: Female) Treating ROBSON, MICHAEL Primary Care Physician/Extender: G PATIENT, NO Physician: Referring Physician: Weeks in Treatment: 53 Visit Information History Since Last Visit Added or deleted any medications: No Patient Arrived: Ambulatory Any new allergies or adverse reactions: No Arrival Time: 10:48 Had a fall or experienced change in No Accompanied By: SELF activities of daily living that may affect Transfer Assistance: None risk of falls: Patient Identification Verified: Yes Signs or symptoms of abuse/neglect since last No Secondary Verification Process Yes visito Completed: Hospitalized since last visit: No Patient Requires Transmission-Based No Has Dressing in Place as Prescribed: Yes Precautions: Pain Present Now: No Patient Has Alerts: No Electronic Signature(s) Signed: 09/23/2015 4:40:39 PM By: Regan Lemming BSN, RN Entered By: Regan Lemming on 09/23/2015 10:50:10 [name] [name] ([id number]) -------------------------------------------------------------------------------- Clinic Level of Care Assessment Details Patient Name: [name] [name] Date of Service: 09/23/2015 10:45 AM Medical Record Patient Account Number: [id number] Number: Treating RN: Baruch Gouty, RN, BSN, Rita 1950/06/24 [phone] y.o. Other Clinician: Date of Birth/Sex: Female) Treating ROBSON, MICHAEL Primary Care Physician/Extender: G PATIENT, NO Physician: Referring Physician: Weeks in Treatment: 87 Clinic Level of Care Assessment Items TOOL 4 Quantity Score []  - Use when only an EandM is performed on FOLLOW-UP visit 0 ASSESSMENTS - Nursing Assessment / Reassessment X -  Reassessment of Co-morbidities (includes updates in patient status) 1 10 X - Reassessment of Adherence to Treatment Plan 1 5 ASSESSMENTS - Wound and Skin Assessment / Reassessment []  - Simple Wound Assessment / Reassessment - one wound 0 X - Complex Wound Assessment / Reassessment - multiple wounds 2 5 []  - Dermatologic / Skin Assessment (not related to wound area) 0 ASSESSMENTS - Focused Assessment []  - Circumferential Edema Measurements - multi extremities 0 []  - Nutritional Assessment / Counseling / Intervention 0 []  - Lower Extremity Assessment (monofilament, tuning fork, pulses) 0 []  - Peripheral Arterial Disease Assessment (using hand held doppler) 0 ASSESSMENTS - Ostomy and/or Continence Assessment and Care []  - Incontinence Assessment and Management 0 []  - Ostomy Care Assessment and Management (repouching, etc.) 0 PROCESS - Coordination of Care X - Simple Patient / Family Education for ongoing care 1 15 []  - Complex (extensive) Patient / Family Education for ongoing care 0 []  - Staff obtains Consents, Records, Test Results / Process Orders 0 [name] [name], [name] [name] ([id number]) []  - Staff telephones HHA, Nursing Homes / Clarify orders / etc 0 []  - Routine Transfer to another Facility (non-emergent condition) 0 []  - Routine Hospital Admission (non-emergent condition) 0 []  - New Admissions / Biomedical engineer / Ordering NPWT, Apligraf, etc. 0 []  - Emergency Hospital Admission (emergent condition) 0 []  - Simple Discharge Coordination 0 []  - Complex (extensive) Discharge Coordination 0 PROCESS - Special Needs []  - Pediatric / Minor Patient Management 0 []  - Isolation Patient Management 0 []  - Hearing / Language / Visual special needs 0 []  - Assessment of Community assistance (transportation, D/C planning, etc.) 0 []  - Additional assistance / Altered mentation 0 []  - Support Surface(s) Assessment (bed, cushion, seat, etc.) 0 INTERVENTIONS - Wound Cleansing / Measurement []  -  Simple Wound Cleansing - one wound 0 X - Complex Wound Cleansing - multiple wounds 2 5 []  - Wound Imaging (photographs - any number of wounds) 0 []  -  Wound Tracing (instead of photographs) 0 []  - Simple Wound Measurement - one wound 0 []  - Complex Wound Measurement - multiple wounds 0 INTERVENTIONS - Wound Dressings []  - Small Wound Dressing one or multiple wounds 0 X - Medium Wound Dressing one or multiple wounds 2 15 []  - Large Wound Dressing one or multiple wounds 0 []  - Application of Medications - topical 0 []  - Application of Medications - injection 0 [name] [name], [name] INTERVENTIONS - Miscellaneous []  - External ear exam 0 []  - Specimen Collection (cultures, biopsies, blood, body fluids, etc.) 0 []  - Specimen(s) / Culture(s) sent or taken to Lab for analysis 0 []  - Patient Transfer (multiple staff / Harrel Lemon Lift / Similar devices) 0 []  - Simple Staple / Suture removal (25 or less) 0 []  - Complex Staple / Suture removal (26 or more) 0 []  - Hypo / Hyperglycemic Management (close monitor of Blood Glucose) 0 []  - Ankle / Brachial Index (ABI) - do not check if billed separately 0 X - Vital Signs 1 5 Has the patient been seen at the hospital within the last three years: Yes Total Score: 85 Level Of Care: New/Established - Level 3 Electronic Signature(s) Signed: 09/23/2015 4:40:39 PM By: Regan Lemming BSN, RN Entered By: Regan Lemming on 09/23/2015 11:11:13 [name] [name] ([id number]) -------------------------------------------------------------------------------- Encounter Discharge Information Details Patient Name: [name] [name] Date of Service: 09/23/2015 10:45 AM Medical Record Patient Account Number: [id number] Number: Treating RN: Baruch Gouty, RN, BSN, Rita 09/02/1950 [phone] y.o. Other Clinician: Date of Birth/Sex: Female) Treating ROBSON, MICHAEL Primary Care Physician/Extender: G PATIENT, NO Physician: Referring Physician: Weeks in Treatment:  45 Encounter Discharge Information Items Discharge Pain Level: 0 Discharge Condition: Stable Ambulatory Status: Ambulatory Discharge Destination: Home Transportation: Private Auto Accompanied By: self Schedule Follow-up Appointment: No Medication Reconciliation completed No and provided to Patient/Care Abie Killian: Patient Clinical Summary of Care: Declined Electronic Signature(s) Signed: 09/23/2015 11:19:41 AM By: Ruthine Dose Entered By: Ruthine Dose on 09/23/2015 11:19:41 [name] [name] ([id number]) -------------------------------------------------------------------------------- Multi Wound Chart Details Patient Name: [name] [name] Date of Service: 09/23/2015 10:45 AM Medical Record Patient Account Number: [id number] Number: Treating RN: Baruch Gouty RN, BSN, Rita 02/03/1951 [phone] y.o. Other Clinician: Date of Birth/Sex: Female) Treating ROBSON, MICHAEL Primary Care Physician/Extender: G PATIENT, NO Physician: Referring Physician: Weeks in Treatment: 62 Vital Signs Height(in): 68 Pulse(bpm): 110 Weight(lbs): 180.5 Blood Pressure 119/80 (mmHg): Body Mass Index(BMI): 27 Temperature(F): 98.5 Respiratory Rate 17 (breaths/min): Photos: [1:No Photos] [4:No Photos] [N/A:N/A] Wound Location: [1:Left Chest] [4:Left Chest - Distal] [N/A:N/A] Wounding Event: [1:Other Lesion] [4:Radiation Burn] [N/A:N/A] Primary Etiology: [1:Malignant Wound] [4:Malignant Wound] [N/A:N/A] Comorbid History: [1:Received Chemotherapy, Received Radiation] [4:Received Chemotherapy, Received Radiation] [N/A:N/A] Date Acquired: [1:05/10/2014] [4:07/13/2015] [N/A:N/A] Weeks of Treatment: [1:62] [4:10] [N/A:N/A] Wound Status: [1:Open] [4:Open] [N/A:N/A] Measurements L x W x D 8.7x13x0.2 [4:1x1x0.1] [N/A:N/A] (cm) Area (cm) : [1:88.829] [4:0.785] [N/A:N/A] Volume (cm) : [1:17.766] [4:0.079] [N/A:N/A] % Reduction in Area: [1:-277.00%] [4:23.10%] [N/A:N/A] % Reduction in Volume: -277.00%  [4:61.30%] [N/A:N/A] Classification: [1:Full Thickness Without Exposed Support Structures] [4:Full Thickness Without Exposed Support Structures] [N/A:N/A] Exudate Amount: [1:Large] [4:Medium] [N/A:N/A] Exudate Type: [1:Serosanguineous] [4:Serosanguineous] [N/A:N/A] Exudate Color: [1:red, brown] [4:red, brown] [N/A:N/A] Wound Margin: [1:Fibrotic scar, thickened scar] [4:Distinct, outline attached] [N/A:N/A] Granulation Amount: [1:Small (1-33%)] [4:Medium (34-66%)] [N/A:N/A] Granulation Quality: [1:Pink, Pale] [4:N/A] [N/A:N/A] Necrotic Amount: [1:Large (67-100%)] [4:Small (1-33%)] [N/A:N/A] Necrotic Tissue: [1:Eschar, Adherent Slough] [4:Adherent Slough] [N/A:N/A] Exposed Structures: Fascia: No Fascia: No N/A Fat: No Fat: No Tendon: No Tendon: No Muscle: No  Muscle: No Joint: No Joint: No Bone: No Bone: No Limited to Skin Limited to Skin Breakdown Breakdown Epithelialization: None None N/A Periwound Skin Texture: Induration: Yes Edema: No N/A Scarring: Yes Excoriation: No Edema: No Induration: No Excoriation: No Callus: No Callus: No Crepitus: No Crepitus: No Fluctuance: No Fluctuance: No Friable: No Friable: No Rash: No Rash: No Scarring: No Periwound Skin No Abnormalities Noted Moist: Yes N/A Moisture: Maceration: No Dry/Scaly: No Periwound Skin Color: No Abnormalities Noted Atrophie Blanche: No N/A Cyanosis: No Ecchymosis: No Erythema: No Hemosiderin Staining: No Mottled: No Pallor: No Rubor: No Temperature: No Abnormality No Abnormality N/A Tenderness on Yes Yes N/A Palpation: Wound Preparation: Ulcer Cleansing: Ulcer Cleansing: N/A Rinsed/Irrigated with Rinsed/Irrigated with Saline Saline Topical Anesthetic Topical Anesthetic Applied: None Applied: Other: LIDOCAINE 4% Treatment Notes Electronic Signature(s) Signed: 09/23/2015 4:40:39 PM By: Regan Lemming BSN, RN Entered By: Regan Lemming on 09/23/2015 11:09:49 [name] [name]  ([id number]) -------------------------------------------------------------------------------- Multi-Disciplinary Care Plan Details Patient Name: [name] [name] Date of Service: 09/23/2015 10:45 AM Medical Record Patient Account Number: [id number] Number: Treating RN: Baruch Gouty RN, BSN, Rita 1950-09-04 [phone] y.o. Other Clinician: Date of Birth/Sex: Female) Treating ROBSON, MICHAEL Primary Care Physician/Extender: G PATIENT, NO Physician: Referring Physician: Weeks in Treatment: 31 Active Inactive Abuse / Safety / Falls / Self Care Management Nursing Diagnoses: Potential for falls Goals: Patient will remain injury free Date Initiated: 07/11/2014 Goal Status: Active Interventions: Assess fall risk on admission and as needed Notes: Malignancy/Atypical Etiology Nursing Diagnoses: Knowledge deficit related to disease process and management of atypical ulcer etiology Goals: Patient/caregiver will verbalize understanding of disease process and disease management of atypical ulcer etiology Date Initiated: 07/11/2014 Goal Status: Active Interventions: Provide education on atypical ulcer etiologies Notes: Nutrition Nursing Diagnoses: Potential for alteratiion in Nutrition/Potential for imbalanced nutrition EMMILOU, RUHE. ([id number]) Goals: Patient/caregiver agrees to and verbalizes understanding of need to use nutritional supplements and/or vitamins as prescribed Date Initiated: 07/11/2014 Goal Status: Active Interventions: Assess patient nutrition upon admission and as needed per policy Notes: Orientation to the Wound Care Program Nursing Diagnoses: Knowledge deficit related to the wound healing center program Goals: Patient/caregiver will verbalize understanding of the [place] Date Initiated: 07/11/2014 Goal Status: Active Interventions: Provide education on orientation to the wound center Notes: Wound/Skin Impairment Nursing  Diagnoses: Impaired tissue integrity Goals: Patient will demonstrate a reduced rate of smoking or cessation of smoking Date Initiated: 07/11/2014 Goal Status: Active Patient will have a decrease in wound volume by X% from date: (specify in notes) Date Initiated: 07/11/2014 Goal Status: Active Patient/caregiver will verbalize understanding of skin care regimen Date Initiated: 07/11/2014 Goal Status: Active Ulcer/skin breakdown will have a volume reduction of 30% by week 4 Date Initiated: 07/11/2014 Goal Status: Active Ulcer/skin breakdown will have a volume reduction of 50% by week 8 Date Initiated: 07/11/2014 [name] [name] ([id number]) Goal Status: Active Ulcer/skin breakdown will have a volume reduction of 80% by week 12 Date Initiated: 07/11/2014 Goal Status: Active Ulcer/skin breakdown will heal within 14 weeks Date Initiated: 07/11/2014 Goal Status: Active Interventions: Assess patient/caregiver ability to obtain necessary supplies Assess patient/caregiver ability to perform ulcer/skin care regimen upon admission and as needed Assess ulceration(s) every visit Provide education on smoking Provide education on ulcer and skin care Screen for HBO Notes: Electronic Signature(s) Signed: 09/23/2015 4:40:39 PM By: Regan Lemming BSN, RN Entered By: Regan Lemming on 09/23/2015 11:09:22 [name] [name] ([id number]) -------------------------------------------------------------------------------- Pain Assessment Details Patient Name: [name] [name] Date of Service: 09/23/2015  10:45 AM Medical Record Patient Account Number: [id number] Number: Treating RN: Afful, RN, BSN, Rita Sep 02, 1950 [phone] y.o. Other Clinician: Date of Birth/Sex: Female) Treating ROBSON, MICHAEL Primary Care Physician/Extender: G PATIENT, NO Physician: Referring Physician: Weeks in Treatment: 65 Active Problems Location of Pain Severity and Description of Pain Patient Has Paino Yes Site  Locations Pain Location: Pain in Ulcers Rate the pain. Current Pain Level: 4 Worst Pain Level: 7 Character of Pain Describe the Pain: Aching, Tender Pain Management and Medication Current Pain Management: How does your pain impact your activities of daily livingo Sleep: Yes Bathing: Yes Appetite: Yes Relationship With Others: Yes Bladder Continence: Yes Emotions: Yes Bowel Continence: Yes Work: Yes Toileting: Yes Drive: Yes Dressing: Yes Hobbies: Yes Electronic Signature(s) Signed: 09/23/2015 4:40:39 PM By: Elpidio Eric BSN, RN Entered By: Elpidio Eric on 09/23/2015 10:50:35 [name] [name] ([id number]) [name] [name], [name] Crews ([id number]) -------------------------------------------------------------------------------- Patient/Caregiver Education Details Patient Name: [name] [name] Date of Service: 09/23/2015 10:45 AM Medical Record Patient Account Number: [id number] Number: Treating RN: Clover Mealy, RN, BSN, Rita [birth year] [phone] y.o. Other Clinician: Date of Birth/Gender: Female) Treating ROBSON, MICHAEL Primary Care Physician/Extender: G PATIENT, NO Physician: Weeks in Treatment: 60 Referring Physician: Education Assessment Education Provided To: Patient Education Topics Provided Malignant/Atypical Wounds: Methods: Explain/Verbal Responses: State content correctly Smoking and Wound Healing: Methods: Explain/Verbal Responses: State content correctly Welcome To The Wound Care Center: Methods: Explain/Verbal Responses: State content correctly Wound/Skin Impairment: Methods: Explain/Verbal Responses: State content correctly Electronic Signature(s) Signed: 09/23/2015 4:40:39 PM By: Elpidio Eric BSN, RN Entered By: Elpidio Eric on 09/23/2015 11:17:41 [name] [name] ([id number]) -------------------------------------------------------------------------------- Wound Assessment Details Patient Name: [name] [name] Date of Service: 09/23/2015 10:45 AM Medical  Record Patient Account Number: [id number] Number: Treating RN: Clover Mealy, RN, BSN, Rita 1951-02-14 [phone] y.o. Other Clinician: Date of Birth/Sex: Female) Treating ROBSON, MICHAEL Primary Care Physician/Extender: G PATIENT, NO Physician: Referring Physician: Weeks in Treatment: 62 Wound Status Wound Number: 1 Primary Malignant Wound Etiology: Wound Location: Left Chest Wound Status: Open Wounding Event: Other Lesion Comorbid Received Chemotherapy, Received Date Acquired: 05/10/2014 History: Radiation Weeks Of Treatment: 62 Clustered Wound: No Photos Photo Uploaded By: Elpidio Eric on 09/23/2015 16:37:26 Wound Measurements Length: (cm) 8.7 Width: (cm) 13 Depth: (cm) 0.2 Area: (cm) 88.829 Volume: (cm) 17.766 % Reduction in Area: -277% % Reduction in Volume: -277% Epithelialization: None Tunneling: No Undermining: No Wound Description Full Thickness Without Exposed Classification: Support Structures Wound Margin: Fibrotic scar, thickened scar Exudate Large Amount: Exudate Type: Serosanguineous Exudate Color: red, brown Foul Odor After Cleansing: No Wound Bed Sebesta, Onyinyechi W. ([id number]) Granulation Amount: Small (1-33%) Exposed Structure Granulation Quality: Pink, Pale Fascia Exposed: No Necrotic Amount: Large (67-100%) Fat Layer Exposed: No Necrotic Quality: Eschar, Adherent Slough Tendon Exposed: No Muscle Exposed: No Joint Exposed: No Bone Exposed: No Limited to Skin Breakdown Periwound Skin Texture Texture Color No Abnormalities Noted: No No Abnormalities Noted: Yes Callus: No Temperature / Pain Crepitus: No Temperature: No Abnormality Excoriation: No Tenderness on Palpation: Yes Fluctuance: No Friable: No Induration: Yes Localized Edema: No Rash: No Scarring: Yes Moisture No Abnormalities Noted: Yes Wound Preparation Ulcer Cleansing: Rinsed/Irrigated with Saline Topical Anesthetic Applied: None Treatment Notes Wound #1 (Left  Chest) 1. Cleansed with: Clean wound with Normal Saline 4. Dressing Applied: Aquacel Ag 5. Secondary Dressing Applied Bordered Foam Dressing Electronic Signature(s) Signed: 09/23/2015 4:40:39 PM By: Elpidio Eric BSN, RN Entered By: Elpidio Eric on 09/23/2015 10:56:56 [name] [name] ([id number]) -------------------------------------------------------------------------------- Wound Assessment Details Patient Name:  [name] [name] Date of Service: 09/23/2015 10:45 AM Medical Record Patient Account Number: [id number] Number: Treating RN: Baruch Gouty, RN, BSN, Rita April 07, 1951 [phone] y.o. Other Clinician: Date of Birth/Sex: Female) Treating ROBSON, MICHAEL Primary Care Physician/Extender: G PATIENT, NO Physician: Referring Physician: Weeks in Treatment: 62 Wound Status Wound Number: 4 Primary Malignant Wound Etiology: Wound Location: Left Chest - Distal Wound Status: Open Wounding Event: Radiation Burn Comorbid Received Chemotherapy, Received Date Acquired: 07/13/2015 History: Radiation Weeks Of Treatment: 10 Clustered Wound: No Photos Photo Uploaded By: Regan Lemming on 09/23/2015 16:38:05 Wound Measurements Length: (cm) 1 Width: (cm) 1 Depth: (cm) 0.1 Area: (cm) 0.785 Volume: (cm) 0.079 % Reduction in Area: 23.1% % Reduction in Volume: 61.3% Epithelialization: None Tunneling: No Undermining: No Wound Description Full Thickness Without Exposed Classification: Support Structures [name] [name], HAUGAN. ([id number]) Foul Odor After Cleansing: No Wound Margin: Distinct, outline attached Exudate Medium Amount: Exudate Type: Serosanguineous Exudate Color: red, brown Wound Bed Granulation Amount: Medium (34-66%) Exposed Structure Necrotic Amount: Small (1-33%) Fascia Exposed: No Necrotic Quality: Adherent Slough Fat Layer Exposed: No Tendon Exposed: No Muscle Exposed: No Joint Exposed: No Bone Exposed: No Limited to Skin Breakdown Periwound Skin  Texture Texture Color No Abnormalities Noted: No No Abnormalities Noted: No Callus: No Atrophie Blanche: No Crepitus: No Cyanosis: No Excoriation: No Ecchymosis: No Fluctuance: No Erythema: No Friable: No Hemosiderin Staining: No Induration: No Mottled: No Localized Edema: No Pallor: No Rash: No Rubor: No Scarring: No Temperature / Pain Moisture Temperature: No Abnormality No Abnormalities Noted: No Tenderness on Palpation: Yes Dry / Scaly: No Maceration: No Moist: Yes Wound Preparation Ulcer Cleansing: Rinsed/Irrigated with Saline Topical Anesthetic Applied: Other: LIDOCAINE 4%, Treatment Notes Wound #4 (Left, Distal Chest) 1. Cleansed with: Clean wound with Normal Saline 4. Dressing Applied: Aquacel Ag 5. Secondary Dressing Applied Bordered Foam Dressing AUDRE, VANVORST ([id number]) Electronic Signature(s) Signed: 09/23/2015 4:40:39 PM By: Regan Lemming BSN, RN Entered By: Regan Lemming on 09/23/2015 10:57:51 [name] [name] ([id number]) -------------------------------------------------------------------------------- Vitals Details Patient Name: [name] [name] Date of Service: 09/23/2015 10:45 AM Medical Record Patient Account Number: [id number] Number: Treating RN: Baruch Gouty, RN, BSN, Rita [birth year] [phone] y.o. Other Clinician: Date of Birth/Sex: Female) Treating ROBSON, MICHAEL Primary Care Physician/Extender: G PATIENT, NO Physician: Referring Physician: Weeks in Treatment: 72 Vital Signs Time Taken: 10:50 Temperature (F): 98.5 Height (in): 68 Pulse (bpm): 110 Weight (lbs): 180.5 Respiratory Rate (breaths/min): 17 Body Mass Index (BMI): 27.4 Blood Pressure (mmHg): 119/80 Reference Range: 80 - 120 mg / dl Electronic Signature(s) Signed: 09/23/2015 4:40:39 PM By: Regan Lemming BSN, RN Entered By: Regan Lemming on 09/23/2015 10:51:03

## 2015-09-23 NOTE — Progress Notes (Signed)
BREUNNA, NORDMANN ([id number]) Visit Report for 09/23/2015 Chief Complaint Document Details Patient Name: VIOLET, SEABURY Date of Service: 09/23/2015 10:45 AM Medical Record Patient Account Number: [id number] Number: Treating RN: Baruch Gouty, RN, BSN, Rita 03/16/1951 [phone] y.o. Other Clinician: Date of Birth/Sex: Female) Treating Kaely Hollan Primary Care Physician/Extender: G PATIENT, NO Physician: Referring Physician: Weeks in Treatment: 65 Information Obtained from: Patient Chief Complaint Patient presents to the wound care center for a consult due non healing wound patient is a self-referral who comes with a history of having open wounds to her left chest wall and right chest wall for about 2 months. 07/18/2014 -- since last week the patient has had no complaints and is on her last cycle of chemotherapy. I have reviewed 155 pages of her reports from a medical oncologist and the summary is made in the history of present illness. Electronic Signature(s) Signed: 09/23/2015 4:35:27 PM By: Linton Ham MD Entered By: Linton Ham on 09/23/2015 12:21:41 [name] ([id number]) -------------------------------------------------------------------------------- HPI Details Patient Name: [name] Date of Service: 09/23/2015 10:45 AM Medical Record Patient Account Number: [id number] Number: Treating RN: Baruch Gouty RN, BSN, Rita Aug 17, 1950 [phone] y.o. Other Clinician: Date of Birth/Sex: Female) Treating Makailyn Mccormick Primary Care Physician/Extender: G PATIENT, NO Physician: Referring Physician: Weeks in Treatment: 27 History of Present Illness HPI Description: this 65 year old patient has come by herself today as a self-referral and has no documentation with her. She has had open wounds to her left chest and the right chest wall for about 2 months. She is not a diabetic and has no significant medical history except breast cancer. Her right  breast cancer was diagnosed 18 years ago and she had a mastectomy and chemotherapy. 3 years ago she was then diagnosed with a breast cancer of the left side which was advanced and could not be operated and was given chemotherapy for a year and then followed by radiation. Her last radiation was over a year ago. She has not had any biopsy done recently from the ulcerated area but she says there have been other biopsies done from the chest wall and these reports are not available at the present time. She was told to keep the wounds open and let them dry out but she is finding it's more and more difficult to stop soiling her clothes if she leaves wounds open. She is on chemotherapy and we will try and obtain these notes from her oncologist. She is not in pain and does not have any significant problems except the discomfort from an open wound on the chest wall. 07/18/2014 This is a summary of Izora Benn date of birth 1951/03/12. These are obtained by reviewing 151 pages of medical records which were obtained from the [facility] 65 year old patient who had a history of stage I right breast cancer status post modified radical mastectomy on 06/12/1996. 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 She received 4 cycles of AC at [facility]. In January 2014 she presented with left breast swelling and apparently cellulitis. Breast biopsy done revealed lobular carcinoma which was ER and PR positive and HER-2/neu negative. On PET scan and she was shown to have multiple areas of disease left breast, axilla and supraclavicular areas. She received 6 cycles of chemotherapy and then received Femara because she was not a surgical candidate. PET scan also revealed disease in the left lateral breast and new cervical lymph nodes at level II. The patient  was then sent for radiation therapy and received this over a month. In January 2016 she had a PET CT  scan done which was compatible with marked progression of disease with multifocal soft tissue masses in the chest wall left greater than right. She is currently receiving Xeloda and is noted to have disease in the chest wall which is widely present. Her most recent lab work from 07/02/2014 shows that her CMP is within normal limits with a serum albumin of 4.4. The previous CBC was within normal limits with a WBC count of 4.3 hemoglobin of 14 hematocrit of 40.9 and platelets of 202. 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 We will continue with palliative care and make her comfortable as much as possible as far as her wound care goes. Present time there is no odor but if this does occur we would use carbon- Redel, Kentley W. ([id number]) based products to mask the odor. Notes were also reviewed from her surgeon Dr. Hervey Ard --closure on 05/27/2014 for breast biopsy in view of the fact that she had extensive disease on her chest wall. Biopsies are taken from the nodular metastatic disease overlying the sternum and nodules chosen for biopsy. Punch biopsies were taken and the pathology on this confirmed that this was consistent with carcinoma 08/15/2014 -- she is started on a new course of injectable chemotherapy and this is 2 weeks on and 2 weeks off. She has no new symptoms no bleeding from the wounds nor is there any odor. She is doing fine otherwise. 09/16/2014. She is doing very well and the right side wounds have completely healed. She has finished 2 cycles of chemotherapy and she is 2 weeks on and 2 weeks off. He is to restart her chemotherapy this week. No fresh complaints pain is within control and there is no odor from the wounds. 10/21/2014 -- overall she is doing very well and is on  a clinical trial with her medical oncologist. No issues with the wound on her left side and there is no odor or drainage. 11/25/2014 -- she continues to be under clinical trial protocol with the medical oncologist and has no fresh issues. 02/17/2015 -- she has been taken off the clinical trial protocol and now is on Taxol on a regular basis with her medical oncologists Dr. Susy Manor. 05/20/15; the patient follow see her for a nonhealing area on the left lateral chest. 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 She has been using Aquacel Ag on this area on a palliative basis. No debridement has been done. 06/17/15; the patient follows in this clinic monthly for a nonhealing area on her left lateral chest. She has known metastatic breast cancer which is metastatic to the chest wall. She returns today with a cluster of open areas on the lateral aspect of her chest. The nurses in the clinic described this as a large open area that progressively closed over. I've taken this opportunity to review her oncology status. On her arrival here she asked me to debridement the wounds on her chest stating that this was a request of [name] her medical oncologist. This is a patient who initially developed stage I right breast cancer treated with a modified radical mastectomy in 1998.  She really presented with inflammatory breast cancer in 2014 post chemotherapy biopsy was positive she was not a surgical candidate. Since then she has been treated with multiple regimens. Most particular to her wounds PET scan in January 2016 revealed marked progression of the disease with multifocal masses in the chest wall left greater than right. 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 Left chest wall biopsy on 04/29/15 revealed  metastatic carcinoma consistent with invasive lobular carcinoma. She has been receiving Faslodex. Weekly. She missed her last injection and is attributing this to the breakdown of tissue on her left chest wall. She was seen by [name] in early March however the record is not complete 07/16/15; patient arrives for her monthly visit the. I've reviewed her oncologist notes Dr. Nolon Stalls. Her notes for the increasing nodularity in the chest wall look. Her pain seems adequately controlled with tramadol. The measurements of the necrotic areas seem to be increasing. They are covered with a thick surface slough however I don't believe the debridement is indicated here. She is dressing knees with Aquacel Ag and foam 08/12/15; this is a patient I follow monthly. Her primary oncologist is [name] of the local oncology clinic. The patient lives in [place]. Unfortunately she has expanding necrotic mass in her left anterior chest which is no doubt malignant wounds from her known metastatic breast cancer. She also has a lot of tenderness on the lateral aspect of this wound which might be cellulitis that she appears to have some form of drainage. I have done a culture although this also could be from expanding necrotic tumor 08/19/15; the patient is here for review of culture reports. At the far left area of her necrotic wound was VENETA, SLITER. ([id number]) purulent drainage last week. This grew both Citrobacter Koseri and MRSA. The patient does not feel systemically unwell although she is still having pain in the area. No fever. She is now on Ibrance for her metastatic breast cancer 08/26/15; patient is just about completed her antibiotics. There is less drainage but still some tenderness on the lateral aspect of her necrotic chest wall wound. She has not been systemically unwell 09/23/15 there is extension of the necrotic malignant ulcer across her left anterior chest. Surrounding tissue is  probably also involved including extensively medially from the wound and also laterally into the axilla. There is no evidence of infection. The patient has an appointment with General surgery tomorrow apparently arranged by her oncologist. Electronic Signature(s) Signed: 09/23/2015 4:35:27 PM By: Linton Ham MD Entered By: Linton Ham on 09/23/2015 12:22:52 [name] ([id number]) -------------------------------------------------------------------------------- Physical Exam Details Patient Name: [name] Date of Service: 09/23/2015 10:45 AM Medical Record Patient Account Number: [id number] Number: Treating RN: Baruch Gouty RN, BSN, Rita [birth year] [phone] y.o. Other Clinician: Date of Birth/Sex: Female) Treating Hermena Swint Primary Care Physician/Extender: G PATIENT, NO Physician: Referring Physician: Weeks in Treatment: 62 Notes Wound exam; necrotic worsening wound expanding across her lateral chest. Multiple nodules are present that are malignant. This is extending into her axilla and also more medially towards her sternum. I suspect all of this is malignant Electronic Signature(s) Signed: 09/23/2015 4:35:27 PM By: Linton Ham MD Entered By: Linton Ham on 09/23/2015 12:23:28 [name] ([id number]) -------------------------------------------------------------------------------- Physician Orders Details Patient Name: [name] Date of Service: 09/23/2015 10:45 AM Medical Record Patient Account Number: [id number]  Number: Treating RN: Baruch Gouty, RN, BSN, Rita [birth year] [phone] y.o. Other Clinician: Date of Birth/Sex: Female) Treating Kanijah Groseclose Primary Care Physician/Extender: G PATIENT, NO Physician: Referring Physician: Weeks in Treatment: 79 Verbal / Phone Orders: Yes Clinician: Afful, RN, BSN, Rita Read Back and Verified: Yes Diagnosis Coding Wound Cleansing Wound #1 Left Chest o Cleanse wound with mild soap  and water o May Shower, gently pat wound dry prior to applying new dressing. o May shower with protection. Wound #4 Left,Distal Chest o Cleanse wound with mild soap and water o May Shower, gently pat wound dry prior to applying new dressing. o May shower with protection. Primary Wound Dressing Wound #1 Left Chest o Aquacel Ag Wound #4 Left,Distal Chest o Aquacel Ag Secondary Dressing Wound #1 Left Chest o Boardered Foam Dressing Wound #4 Left,Distal Chest o Boardered Foam Dressing Dressing Change Frequency Wound #1 Left Chest o Change dressing every other day. - or more if needed Wound #4 Left,Distal Chest o Change dressing every other day. - or more if needed Follow-up Appointments KAILLY, RICHOUX. ([id number]) Wound #1 Left Chest o Return Appointment in 1 week. Wound #4 Left,Distal Chest o Return Appointment in 1 week. Medications-please add to medication list. Wound #1 Left Chest o P.O. Antibiotics - conitinue Cefdinir 371m every 12 hrs for 10 days o P.O. Antibiotics - continue Doxycycline 1073mBID for 10 days by mouth Wound #4 Left,Distal Chest o P.O. Antibiotics - conitinue Cefdinir 30075mvery 12 hrs for 10 days o P.O. Antibiotics - continue Doxycycline 100m61mD for 10 days by mouth Electronic Signature(s) Signed: 09/23/2015 4:35:27 PM By: RobsLinton HamSigned: 09/23/2015 4:40:39 PM By: AffuRegan Lemming, RN Entered By: AffuRegan Lemming06/13/2017 11:10:35 GOODEvlyn [id number]------------------------------------------------------------------------------ Prescription 09/23/2015 Patient Name: GOODEvlyn Couriersician: ROBSRicard DillonDate of Birth: 11/111-13-1952#: [id number]: F DEA#: [id number] #: [id number] #: [id number] Address: [place] [id number] [id number] sulfa Reaction: vomiting Severity: Mild Physician's Orders P.O. Antibiotics - conitinue Cefdinir 300mg16mry 12 hrs for 10 days Signature(s): Date(s): Electronic Signature(s) Signed: 09/23/2015 4:35:27 PM By: RobsoLinton Hamntered By: RobsoLinton Ham6/13/2017 12:25:10 GOODMEvlyn [id number]-----------------------------------------------------------------------------  Problem List Details Patient Name: GOODMEvlyn Courier of Service: 09/23/2015 10:45 AM Medical Record Patient Account Number: [id number]: Treating RN: AffulBaruch Gouty BSN, Rita 11/18Apr 27, [phone] Other Clinician: Date of Birth/Sex: Female) Treating Katharin Schneider Primary Care Physician/Extender: G PATIENT, NO Physician: Referring Physician: Weeks in Treatment: 62 Ac4ve Problems ICD-10 Encounter Code Description Active Date Diagnosis C50.812 Malignant neoplasm of overlapping sites of left female 07/11/2014 Yes breast S21.001A Unspecified open wound of right breast, initial encounter 07/11/2014 Yes C50.412 Malignant neoplasm of upper-outer quadrant of left female 07/11/2014 Yes breast S21.002A Unspecified open wound of left breast, initial encounter 07/11/2014 Yes Inactive Problems Resolved Problems Electronic Signature(s) Signed: 09/23/2015 4:35:27 PM By: RobsoLinton Hamntered By: RobsoLinton Ham6/13/2017 12:21:30 GOODMEvlyn [id number]----------------------------------------------------------------------------- Progress Note Details Patient Name: GOODMEvlyn Courier of Service: 09/23/2015 10:45 AM Medical Record Patient Account Number: [id number]: Treating RN: AffulBaruch Gouty BSN, Rita [id number] Other Clinician: Date of Birth/Sex: Female) Treating Arnesha Schiraldi Primary Care Physician/Extender: G PATIENT, NO Physician: Referring Physician: Weeks in Treatment:  62 Su98ective Chief Complaint Information obtained from Patient  Patient presents to the wound care center for a consult due non healing wound patient is a self-referral who comes with a history of having open wounds to her left chest wall and right chest wall for about 2 months. 07/18/2014 -- since last week the patient has had no complaints and is on her last cycle of chemotherapy. I have reviewed 155 pages of her reports from a medical oncologist and the summary is made in the history of present illness. History of Present Illness (HPI) this 65 year old patient has come by herself today as a self-referral and has no documentation with her. She has had open wounds to her left chest and the right chest wall for about 2 months. She is not a diabetic and has no significant medical history except breast cancer. Her right breast cancer was diagnosed 18 years ago and she had a mastectomy and chemotherapy. 3 years ago she was then diagnosed with a breast cancer of the left side which was advanced and could not be operated and was given chemotherapy for a year and then followed by radiation. Her last radiation was over a year ago. She has not had any biopsy done recently from the ulcerated area but she says there have been other biopsies done from the chest wall and these reports are not available at the present time. She was told to keep the wounds open and let them dry out but she is finding it's more and more difficult to stop soiling her clothes if she leaves wounds open. She is on chemotherapy and we will try and obtain these notes from her oncologist. She is not in pain and does not have any significant problems except the discomfort from an open wound on the chest wall. 07/18/2014 This is a summary of Shahana Capes date of birth Apr 11, 1951. These are obtained by reviewing 151 pages of medical records which were obtained from the [facility] 65 year old patient who had a history of  stage I right breast cancer status post modified radical mastectomy on 06/12/1996. 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 She received 4 cycles of AC at [facility]. In January 2014 she presented with left breast swelling and apparently cellulitis. Breast biopsy done revealed lobular carcinoma which was ER and PR positive and HER-2/neu negative. On PET scan and she was shown to have multiple areas of disease left breast, axilla and supraclavicular areas. She received 6 cycles of chemotherapy and then received Femara because she was not a surgical candidate. PET scan also revealed disease in the left lateral breast and new cervical lymph nodes at level II. The patient was then sent for radiation therapy and received this over a month. In January 2016 she had a PET CT scan done which was compatible with marked progression of disease Tabak, Paralee W. ([id number]) with multifocal soft tissue masses in the chest wall left greater than right. She is currently receiving Xeloda and is noted to have disease in the chest wall which is widely present. Her most recent lab work from 07/02/2014 shows that her CMP is within normal limits with a serum albumin of 4.4. The previous CBC was within normal limits with a WBC count of 4.3 hemoglobin of 14 hematocrit of 40.9 and platelets of 202. After reading the reports as above I believe that this patient has extensive stage IV breast carcinoma which has now infiltrated into the skin of the chest wall and has fungating tumors coming  through in several places especially on the left. We will continue with palliative care and make her comfortable as much as possible as far as her wound care goes. Present time there is no odor but if this does occur we would use carbon- based products to mask the odor. Notes were also reviewed from her surgeon Dr. Hervey Ard --closure on 05/27/2014 for breast biopsy in view of  the fact that she had extensive disease on her chest wall. Biopsies are taken from the nodular metastatic disease overlying the sternum and nodules chosen for biopsy. Punch biopsies were taken and the pathology on this confirmed that this was consistent with carcinoma 08/15/2014 -- she is started on a new course of injectable chemotherapy and this is 2 weeks on and 2 weeks off. She has no new symptoms no bleeding from the wounds nor is there any odor. She is doing fine otherwise. 09/16/2014. She is doing very well and the right side wounds have completely healed. She has finished 2 cycles of chemotherapy and she is 2 weeks on and 2 weeks off. He is to restart her chemotherapy this week. No fresh complaints pain is within control and there is no odor from the wounds. 10/21/2014 -- overall she is doing very well and is on a clinical trial with her medical oncologist. No issues with the wound on her left side and there is no odor or drainage. 11/25/2014 -- she continues to be under clinical trial protocol with the medical oncologist and has no fresh issues. 02/17/2015 -- she has been taken off the clinical trial protocol and now is on Taxol on a regular basis with her medical oncologists Dr. Susy Manor. 05/20/15; the patient follow see her for a nonhealing area on the left lateral chest. 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 She has been using Aquacel Ag on this area on a palliative basis. No debridement has been done. 06/17/15; the patient follows in this clinic monthly for a nonhealing area on her left lateral chest. She has known metastatic breast cancer which is metastatic to the chest wall. She returns today with a cluster of open areas on the lateral aspect of her chest. The nurses in the clinic described this as a large open area that progressively closed over. I've taken this opportunity to review her oncology status. On her  arrival here she asked me to debridement the wounds on her chest stating that this was a request of [name] her medical oncologist. This is a patient who initially developed stage I right breast cancer treated with a modified radical mastectomy in 1998. She really presented with inflammatory breast cancer in 2014 post chemotherapy biopsy was positive she was not a surgical candidate. Since then she has been treated with multiple regimens. Most particular to her wounds PET scan in January 2016 revealed marked progression of the disease with multifocal masses in the chest wall left greater than right. 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 She has been receiving Faslodex. Weekly. She missed her last injection and is attributing this to the breakdown of tissue on her left chest wall. She was seen by [name] in early March however the record is not complete 07/16/15; patient arrives for her monthly  visit the. I've reviewed her oncologist notes Dr. Nolon Stalls. Her notes for the increasing nodularity in the chest wall look. Her pain seems adequately controlled with AIRIANA, ELMAN. ([id number]) tramadol. The measurements of the necrotic areas seem to be increasing. They are covered with a thick surface slough however I don't believe the debridement is indicated here. She is dressing knees with Aquacel Ag and foam 08/12/15; this is a patient I follow monthly. Her primary oncologist is [name] of the local oncology clinic. The patient lives in [place]. Unfortunately she has expanding necrotic mass in her left anterior chest which is no doubt malignant wounds from her known metastatic breast cancer. She also has a lot of tenderness on the  lateral aspect of this wound which might be cellulitis that she appears to have some form of drainage. I have done a culture although this also could be from expanding necrotic tumor 08/19/15; the patient is here for review of culture reports. At the far left area of her necrotic wound was purulent drainage last week. This grew both Citrobacter Koseri and MRSA. The patient does not feel systemically unwell although she is still having pain in the area. No fever. She is now on Ibrance for her metastatic breast cancer 08/26/15; patient is just about completed her antibiotics. There is less drainage but still some tenderness on the lateral aspect of her necrotic chest wall wound. She has not been systemically unwell 09/23/15 there is extension of the necrotic malignant ulcer across her left anterior chest. Surrounding tissue is probably also involved including extensively medially from the wound and also laterally into the axilla. There is no evidence of infection. The patient has an appointment with General surgery tomorrow apparently arranged by her oncologist. Objective Constitutional Vitals Time Taken: 10:50 AM, Height: 68 in, Weight: 180.5 lbs, BMI: 27.4, Temperature: 98.5 F, Pulse: 110 bpm, Respiratory Rate: 17 breaths/min, Blood Pressure: 119/80 mmHg. Integumentary (Hair, Skin) Wound #1 status is Open. Original cause of wound was Other Lesion. The wound is located on the Left Chest. The wound measures 8.7cm length x 13cm width x 0.2cm depth; 88.829cm^2 area and 17.766cm^3 volume. The wound is limited to skin breakdown. There is no tunneling or undermining noted. There is a large amount of serosanguineous drainage noted. The wound margin is fibrotic, thickened scar. There is small (1-33%) pink, pale granulation within the wound bed. There is a large (67-100%) amount of necrotic tissue within the wound bed including Eschar and Adherent Slough. The periwound skin appearance had no abnormalities  noted for moisture. The periwound skin appearance had no abnormalities noted for color. The periwound skin appearance exhibited: Induration, Scarring. The periwound skin appearance did not exhibit: Callus, Crepitus, Excoriation, Fluctuance, Friable, Localized Edema, Rash. Periwound temperature was noted as No Abnormality. The periwound has tenderness on palpation. Wound #4 status is Open. Original cause of wound was Radiation Burn. The wound is located on the Left,Distal Chest. The wound measures 1cm length x 1cm width x 0.1cm depth; 0.785cm^2 area and 0.079cm^3 volume. The wound is limited to skin breakdown. There is no tunneling or undermining noted. There is a medium amount of serosanguineous drainage noted. The wound margin is distinct with the outline attached to the wound base. There is medium (34-66%) granulation within the wound bed. There is a small (1-33%) amount of necrotic tissue within the wound bed including Adherent Slough. The periwound skin appearance exhibited: Moist. The periwound skin appearance did not exhibit: Callus, Crepitus, Vilar, Raelyn W. ([id number]) Excoriation,  Fluctuance, Friable, Induration, Localized Edema, Rash, Scarring, Dry/Scaly, Maceration, Atrophie Blanche, Cyanosis, Ecchymosis, Hemosiderin Staining, Mottled, Pallor, Rubor, Erythema. Periwound temperature was noted as No Abnormality. The periwound has tenderness on palpation. 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 May shower with protection. Wound #4 Left,Distal Chest: Cleanse  wound with mild soap and water May Shower, gently pat wound dry prior to applying new dressing. May shower with protection. Primary Wound Dressing: Wound #1 Left Chest: Aquacel Ag Wound #4 Left,Distal Chest: Aquacel Ag Secondary Dressing: Wound #1 Left Chest: Boardered Foam Dressing Wound #4 Left,Distal Chest: Boardered Foam Dressing Dressing Change Frequency: Wound #1 Left Chest: Change dressing every other day. - or more if needed Wound #4 Left,Distal Chest: Change dressing every other day. - or more if needed Follow-up Appointments: Wound #1 Left Chest: DOROTHY, POLHEMUS ([id number]) Return Appointment in 1 week. Wound #4 Left,Distal Chest: Return Appointment in 1 week. Medications-please add to medication list.: Wound #1 Left Chest: P.O. Antibiotics - conitinue Cefdinir 336m every 12 hrs for 10 days P.O. Antibiotics - continue Doxycycline 1043mBID for 10 days by mouth Wound #4 Left,Distal Chest: P.O. Antibiotics - conitinue Cefdinir 30032mvery 12 hrs for 10 days P.O. Antibiotics - continue Doxycycline 100m42mD for 10 days by mouth #1 I am wondering continue with Aquacel Ag that we are supplying the patient with. She changes her own dressings. Unfortunately this does not appear to be currently infected although she did have an infection in this a month or 2 ago. There is not much drainage and no odor. #2 I would be surprised if there was a surgical option to do with any of this although I'll look forward to the consultation tomorrow Electronic Signature(s) Signed: 09/23/2015 4:35:27 PM By: RobsLinton HamEntered By: RobsLinton Ham06/13/2017 12:24:25 GOODEvlyn [id number]------------------------------------------------------------------------------ [place] Patient Name: GOODEvlyn Couriere of Service: 09/23/2015 Medical Record Patient Account Number: [id number]: Treating RN: AffuBaruch Gouty BSN, Rita 11/1May 26, 1952 [phone].  Other Clinician: Date of Birth/Sex: Female) Treating Xoie Kreuser Primary Care Physician/Extender: G PATIENT, NO Physician: Weeks in Treatment: 62 R102erring Physician: Diagnosis Coding ICD-10 Codes Code Description C50.812 Malignant neoplasm of overlapping sites of left female breast S21.001A Unspecified open wound of right breast, initial encounter C50.412 Malignant neoplasm of upper-outer quadrant of left female breast S21.002A Unspecified open wound of left breast, initial encounter Facility Procedures CPT4 Code: [id number]: [facility] 3 EST PT Modifier: Quantity: 1 Physician Procedures CPT4 Code: [id number]: [id number] PHYS LEVEL 2 - EST PT ICD-10 Description Diagnosis C50.812 Malignant neoplasm of overlapping sites of left Modifier: female breast Quantity: 1 Electronic Signature(s) Signed: 09/23/2015 4:35:27 PM By: RobsLinton HamEntered By: RobsLinton Ham06/13/2017 12:25:03

## 2015-09-24 ENCOUNTER — Ambulatory Visit (INDEPENDENT_AMBULATORY_CARE_PROVIDER_SITE_OTHER): Payer: Medicare Other | Admitting: General Surgery

## 2015-09-24 ENCOUNTER — Encounter: Payer: Self-pay | Admitting: General Surgery

## 2015-09-24 VITALS — BP 130/0 | HR 80 | Resp 14 | Ht 67.0 in | Wt 158.0 lb

## 2015-09-24 DIAGNOSIS — C50112 Malignant neoplasm of central portion of left female breast: Secondary | ICD-10-CM | POA: Diagnosis not present

## 2015-09-24 NOTE — Patient Instructions (Addendum)
The patient is aware to call back for any questions or concerns. Appointment with plastic surgeon.

## 2015-09-24 NOTE — Progress Notes (Signed)
Patient ID: [name] [name], female   DOB: 1950/09/24, 65 y.o.   MRN: [id number]  Chief Complaint  Patient presents with  . Follow-up    HPI [name] [name] is a 65 y.o. female.  Here today for follow up left chest wall wound. She states the area is still draining and has tenderness. She goes to wound clinic once a month. She is using a silver patch daily.  I was asked to see the patient by her treating oncologist to look for other opportunities for management of the enlarging chest wall wound.  The patient reports that she is having no pain and has appreciated in improvement in the faint odor previously noted.  I personally reviewed the patient's history.   HPI  Past Medical History  Diagnosis Date  . Personal history of malignant neoplasm of breast   . Anemia   . Blood transfusion without reported diagnosis   . Emphysema of lung ([place])   . History of left breast cancer , known active 2013    Past Surgical History  Procedure Laterality Date  . Oophrectomy  1980  . Abdominal hysterectomy  1995  . Breast surgery Right 1996    mastectomy  . Breast biopsy Left 2014  . Breast biopsy Left 01-21-14  . Nephrectomy      Family History  Problem Relation Age of Onset  . Cancer Father   . Heart disease Sister   . Diabetes Sister   . Heart disease Brother   . Cancer Paternal Grandmother     breast    Social History Social History  Substance Use Topics  . Smoking status: Current Every Day Smoker -- 0.25 packs/day for 25 years    Types: Cigarettes  . Smokeless tobacco: Never Used     Comment: 10/29/14-now decreased. smokes 1 pack per week  . Alcohol Use: No    Allergies  Allergen Reactions  . Sulfa Antibiotics Nausea And Vomiting and Other (See Comments)    headache    Current Outpatient Prescriptions  Medication Sig Dispense Refill  . Calcium Carb-Cholecalciferol (CALCIUM 600 + D PO) Take 1 tablet by mouth 2 (two) times daily.    Marland Kitchen KLOR-CON 10 10 MEQ tablet TAKE 1  TABLET (10 MEQ TOTAL) BY MOUTH DAILY. 30 tablet 1  . LORazepam (ATIVAN) 0.5 MG tablet Take 0.5 mg by mouth daily as needed for anxiety.   0  . palbociclib (IBRANCE) 125 MG capsule Take 1 capsule (125 mg total) by mouth daily with breakfast. Take whole with food. 21 capsule 2  . traMADol (ULTRAM) 50 MG tablet Take 1 tablet (50 mg total) by mouth every 8 (eight) hours as needed. 90 tablet 0   No current facility-administered medications for this visit.   Facility-Administered Medications Ordered in Other Visits  Medication Dose Route Frequency Provider Last Rate Last Dose  . sodium chloride 0.9 % injection 10 mL  10 mL Intravenous PRN Lequita Asal, MD   10 mL at 10/08/14 1335  . sodium chloride 0.9 % injection 10 mL  10 mL Intracatheter PRN Lequita Asal, MD   10 mL at 11/26/14 1355    Review of Systems Review of Systems  Blood pressure 130/0, pulse 80, resp. rate 14, height 5\' 7"  (1.702 m), weight 158 lb (71.668 kg).  Physical Exam Physical Exam  Constitutional: She is oriented to person, place, and time. She appears well-developed and well-nourished.  HENT:  Mouth/Throat: Oropharynx is clear and moist.  Eyes: Conjunctivae are  normal. No scleral icterus.  Neck: Neck supple.  Cardiovascular: Normal rate, regular rhythm and normal heart sounds.   Pulmonary/Chest: Effort normal and breath sounds normal.    Left chest wound   Musculoskeletal:       Arms: Lymphadenopathy:    She has no cervical adenopathy.    She has no axillary adenopathy.  Neurological: She is alert and oriented to person, place, and time.  Skin: Skin is warm and dry.  Psychiatric: Her behavior is normal.    Data Reviewed Medical oncology notes.  Assessment    Locally aggressive invasive lobular carcinoma the left breast.    Plan    The patient has previously had chest wall radiation as well as multiple courses of chemotherapy and hormonal therapy. Last imaging did not show any evidence of  metastatic disease. She is noted to have upper extremity swelling today, not previously noted, likely secondary to axillary disease.  She's pain-free, and she is reluctant to consider a major operation (TRAM flap with or without latissimus flap) for coverage of the area after tumor debulking. She does feel that she may satisfy her children if she meets with the plastic surgery service.  The patient will contact the office of where she would like to be evaluated, [facility], [facility] or [facility] and arrangements will be made.  Photographs were obtained today to send onto the consulting physicians.    Appointment with plastic surgeon.  PCP:  No Pcp  This information has been scribed by Karie Fetch RN, BSN,BC.    Robert Bellow 09/24/2015, 11:59 AM

## 2015-09-25 ENCOUNTER — Telehealth: Payer: Self-pay | Admitting: *Deleted

## 2015-09-25 NOTE — Telephone Encounter (Signed)
Patient called to let you know that she decided she wants to be evaluated at [facility] first.

## 2015-09-30 ENCOUNTER — Other Ambulatory Visit: Payer: Self-pay | Admitting: *Deleted

## 2015-09-30 MED ORDER — PALBOCICLIB 125 MG PO CAPS: 125.0000 mg | ORAL_CAPSULE | Freq: Every day | ORAL | Status: DC

## 2015-10-03 ENCOUNTER — Telehealth: Payer: Self-pay

## 2015-10-03 ENCOUNTER — Inpatient Hospital Stay: Payer: Medicare Other

## 2015-10-03 DIAGNOSIS — C50912 Malignant neoplasm of unspecified site of left female breast: Secondary | ICD-10-CM

## 2015-10-03 DIAGNOSIS — C50412 Malignant neoplasm of upper-outer quadrant of left female breast: Secondary | ICD-10-CM | POA: Diagnosis not present

## 2015-10-03 DIAGNOSIS — C792 Secondary malignant neoplasm of skin: Secondary | ICD-10-CM

## 2015-10-03 LAB — CBC WITH DIFFERENTIAL/PLATELET
Basophils Absolute: 0 10*3/uL (ref 0–0.1)
Basophils Relative: 1 %
Eosinophils Absolute: 0 10*3/uL (ref 0–0.7)
Eosinophils Relative: 1 %
HCT: 34.2 % — ABNORMAL LOW (ref 35.0–47.0)
Hemoglobin: 11.9 g/dL — ABNORMAL LOW (ref 12.0–16.0)
Lymphocytes Relative: 18 %
Lymphs Abs: 0.8 10*3/uL — ABNORMAL LOW (ref 1.0–3.6)
MCH: 30.8 pg (ref 26.0–34.0)
MCHC: 34.6 g/dL (ref 32.0–36.0)
MCV: 89 fL (ref 80.0–100.0)
Monocytes Absolute: 0.2 10*3/uL (ref 0.2–0.9)
Monocytes Relative: 5 %
Neutro Abs: 3.2 10*3/uL (ref 1.4–6.5)
Neutrophils Relative %: 75 %
Platelets: 274 10*3/uL (ref 150–440)
RBC: 3.85 MIL/uL (ref 3.80–5.20)
RDW: 20.7 % — ABNORMAL HIGH (ref 11.5–14.5)
WBC: 4.3 10*3/uL (ref 3.6–11.0)

## 2015-10-03 NOTE — Telephone Encounter (Signed)
Called pt about back about Ibrance and nueropathy.  Pt stopped me in  Clinic today and reported that her feet were feeling colder but it was no effecting her walking or daily living.  Spoke with [name] and she said as long as the pt quality is not effected she would like to keep her on the same dose of Ibrance to get full potential of medication.  Pt verbalized an understanding and was also in agreement.  I also answered her question about [name] speaking with Dr. Bary Castilla.  Per [name], Dr. Bary Castilla can not do anything from his standpoint surgically and will refer to plastics.  No other concerns.  Pt verbalized a thanks

## 2015-10-15 ENCOUNTER — Telehealth: Payer: Self-pay | Admitting: General Surgery

## 2015-10-15 NOTE — Telephone Encounter (Signed)
We contacted the plastic surgery service at [facility] regarding possible flap/skin graft coverage of the large chest wall area involved with invasive lobular carcinoma. They were unwilling to see the patient as she is a smoker and this puts any flap reconstruction at high risk of failure.  The patient reports she's doing well with her dressing changes although there is still a significant amount of drainage. She reports that to her the breast actually looks a little bit "better".  She's been a try to discontinue smoking. If she is able to for 6 weeks he'll come in for a visit, we'll obtain a urine screen to confirm and then re-contact [facility] plastic surgery.

## 2015-10-16 ENCOUNTER — Other Ambulatory Visit: Payer: Self-pay | Admitting: Hematology and Oncology

## 2015-10-17 ENCOUNTER — Inpatient Hospital Stay: Payer: Medicare Other

## 2015-10-17 ENCOUNTER — Inpatient Hospital Stay: Payer: Medicare Other | Attending: Hematology and Oncology

## 2015-10-17 ENCOUNTER — Encounter: Payer: Self-pay | Admitting: Hematology and Oncology

## 2015-10-17 ENCOUNTER — Inpatient Hospital Stay (HOSPITAL_BASED_OUTPATIENT_CLINIC_OR_DEPARTMENT_OTHER): Payer: Medicare Other | Admitting: Hematology and Oncology

## 2015-10-17 VITALS — BP 126/74 | HR 111 | Temp 99.2°F | Ht 67.5 in | Wt 155.8 lb

## 2015-10-17 DIAGNOSIS — G893 Neoplasm related pain (acute) (chronic): Secondary | ICD-10-CM | POA: Insufficient documentation

## 2015-10-17 DIAGNOSIS — G629 Polyneuropathy, unspecified: Secondary | ICD-10-CM

## 2015-10-17 DIAGNOSIS — Z9011 Acquired absence of right breast and nipple: Secondary | ICD-10-CM | POA: Insufficient documentation

## 2015-10-17 DIAGNOSIS — F17210 Nicotine dependence, cigarettes, uncomplicated: Secondary | ICD-10-CM

## 2015-10-17 DIAGNOSIS — Z170 Estrogen receptor positive status [ER+]: Secondary | ICD-10-CM | POA: Insufficient documentation

## 2015-10-17 DIAGNOSIS — C50112 Malignant neoplasm of central portion of left female breast: Secondary | ICD-10-CM

## 2015-10-17 DIAGNOSIS — J439 Emphysema, unspecified: Secondary | ICD-10-CM | POA: Diagnosis not present

## 2015-10-17 DIAGNOSIS — Z79899 Other long term (current) drug therapy: Secondary | ICD-10-CM

## 2015-10-17 DIAGNOSIS — C50912 Malignant neoplasm of unspecified site of left female breast: Secondary | ICD-10-CM | POA: Insufficient documentation

## 2015-10-17 DIAGNOSIS — Z853 Personal history of malignant neoplasm of breast: Secondary | ICD-10-CM | POA: Insufficient documentation

## 2015-10-17 DIAGNOSIS — C792 Secondary malignant neoplasm of skin: Secondary | ICD-10-CM

## 2015-10-17 DIAGNOSIS — Z171 Estrogen receptor negative status [ER-]: Secondary | ICD-10-CM

## 2015-10-17 DIAGNOSIS — Z9221 Personal history of antineoplastic chemotherapy: Secondary | ICD-10-CM | POA: Insufficient documentation

## 2015-10-17 LAB — CBC WITH DIFFERENTIAL/PLATELET
Basophils Absolute: 0 10*3/uL (ref 0–0.1)
Basophils Relative: 1 %
Eosinophils Absolute: 0 10*3/uL (ref 0–0.7)
Eosinophils Relative: 0 %
HCT: 35.9 % (ref 35.0–47.0)
Hemoglobin: 12.2 g/dL (ref 12.0–16.0)
Lymphocytes Relative: 28 %
Lymphs Abs: 1.2 10*3/uL (ref 1.0–3.6)
MCH: 31.2 pg (ref 26.0–34.0)
MCHC: 33.8 g/dL (ref 32.0–36.0)
MCV: 92.3 fL (ref 80.0–100.0)
Monocytes Absolute: 0.7 10*3/uL (ref 0.2–0.9)
Monocytes Relative: 16 %
Neutro Abs: 2.2 10*3/uL (ref 1.4–6.5)
Neutrophils Relative %: 55 %
Platelets: 193 10*3/uL (ref 150–440)
RBC: 3.89 MIL/uL (ref 3.80–5.20)
RDW: 21.7 % — ABNORMAL HIGH (ref 11.5–14.5)
WBC: 4.1 10*3/uL (ref 3.6–11.0)

## 2015-10-17 LAB — COMPREHENSIVE METABOLIC PANEL
ALT: 20 U/L (ref 14–54)
AST: 24 U/L (ref 15–41)
Albumin: 3.9 g/dL (ref 3.5–5.0)
Alkaline Phosphatase: 101 U/L (ref 38–126)
Anion gap: 9 (ref 5–15)
BUN: 13 mg/dL (ref 6–20)
CO2: 25 mmol/L (ref 22–32)
Calcium: 9.4 mg/dL (ref 8.9–10.3)
Chloride: 101 mmol/L (ref 101–111)
Creatinine, Ser: 0.89 mg/dL (ref 0.44–1.00)
GFR calc Af Amer: >60 mL/min (ref >60)
GFR calc non Af Amer: >60 mL/min (ref >60)
Glucose, Bld: 134 mg/dL — ABNORMAL HIGH (ref 65–99)
Potassium: 3.8 mmol/L (ref 3.5–5.1)
Sodium: 135 mmol/L (ref 135–145)
Total Bilirubin: 0.2 mg/dL — ABNORMAL LOW (ref 0.3–1.2)
Total Protein: 8 g/dL (ref 6.5–8.1)

## 2015-10-17 NOTE — Progress Notes (Signed)
Patient here for follow up. Experiencing level 4 pain in left side (indicated  Lung area). Appetite has been decreased 3 lbs wt loss since 6/14.

## 2015-10-17 NOTE — Progress Notes (Signed)
[facility] day:  10/17/2015   Chief Complaint: [name] [name] is a 65 y.o. female with recurrent breast cancer who is seen for assessment prior to monthly Faslodex and day 1 of cycle #4 Ibrance.  HPI: The patient was last seen in the medical oncology clinic on 09/17/2015.  At that time, she had significant chest wall nodularity. Pain was controlled with Tramadol.  She was on day 27 of cycle #2 Ibrance and Faslodex.  CA27.29 was 1031.  Because of wound issues, she was referred to Dr. Bary Castilla, surgeon.  Discussions were held about referral to [place], [place] or [place].  She states that plastic surgery was unwilling to see her secondary to her smoking.  Symptomatically, she feels "the same".  She notes areas "popping up".  She has increased pain and discomfort,  She is taking tramadol 50 mg every 8 hours.  She is scheduled to start her next cycle of Ibrance today.   Past Medical History  Diagnosis Date  . Personal history of malignant neoplasm of breast   . Anemia   . Blood transfusion without reported diagnosis   . Emphysema of lung ([place])   . History of left breast cancer , known active 2013    Past Surgical History  Procedure Laterality Date  . Oophrectomy  1980  . Abdominal hysterectomy  1995  . Breast surgery Right 1996    mastectomy  . Breast biopsy Left 2014  . Breast biopsy Left 01-21-14  . Nephrectomy      Family History  Problem Relation Age of Onset  . Cancer Father   . Heart disease Sister   . Diabetes Sister   . Heart disease Brother   . Cancer Paternal Grandmother     breast    Social History:  reports that she has been smoking Cigarettes.  She has a 6.25 pack-year smoking history. She has never used smokeless tobacco. She reports that she does not drink alcohol or use illicit drugs.  She is smoking 1/2 pack per day.  She is alone today.  Allergies:  Allergies  Allergen Reactions  . Sulfa Antibiotics Nausea And  Vomiting and Other (See Comments)    headache    Current Medications: Current Outpatient Prescriptions  Medication Sig Dispense Refill  . Calcium Carb-Cholecalciferol (CALCIUM 600 + D PO) Take 1 tablet by mouth 2 (two) times daily.    Marland Kitchen KLOR-CON 10 10 MEQ tablet TAKE 1 TABLET (10 MEQ TOTAL) BY MOUTH DAILY. 30 tablet 1  . LORazepam (ATIVAN) 0.5 MG tablet Take 0.5 mg by mouth daily as needed for anxiety.   0  . palbociclib (IBRANCE) 125 MG capsule Take 1 capsule (125 mg total) by mouth daily with breakfast. Take whole with food. 21 capsule 2  . traMADol (ULTRAM) 50 MG tablet Take 1 tablet (50 mg total) by mouth every 8 (eight) hours as needed. 90 tablet 0   No current facility-administered medications for this visit.   Facility-Administered Medications Ordered in Other Visits  Medication Dose Route Frequency Provider Last Rate Last Dose  . sodium chloride 0.9 % injection 10 mL  10 mL Intravenous PRN Lequita Asal, MD   10 mL at 10/08/14 1335  . sodium chloride 0.9 % injection 10 mL  10 mL Intracatheter PRN Lequita Asal, MD   10 mL at 11/26/14 1355    Review of Systems:  GENERAL:  Feels "the same".  No fevers or sweats.  Weight down  3 pounds. PERFORMANCE STATUS (ECOG):  1 HEENT:  No visual changes, runny nose, sore throat, mouth sores or tenderness. Lungs: No shortness of breath.  No cough.  No hemoptysis. Cardiac:  No chest pain, palpitations, orthopnea, or PND. GI:  Appetite 50%.  Constipation.  No nausea, vomiting, diarrhea, melena or hematochezia. GU:  No urgency, frequency, dysuria, or hematuria. Musculoskeletal:  No back pain.  No joint pain.  No muscle tenderness. Extremities:  No pain or swelling. Skin:  Hair thin.  Chest wall lesions popping up (see HPI).  No rashes or skin changes. 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  No headache, numbness or weakness, or coordination issues. Endocrine:  No diabetes, thyroid issues, hot flashes or night sweats. Psych:   Emotional issues secondary to family.  Pain:  Chest wall lesions tenderness.  Pain controlled with Tramadol. Review of systems:  All other systems reviewed and found to be negative.   Physical Exam: Blood pressure 126/74, pulse 111, temperature 99.2 F (37.3 C), temperature source Oral, height 5' 7.5" (1.715 m), weight 155 lb 12.1 oz (70.65 kg).  GENERAL: Well developed, well nourished, woman sitting comfortably in the exam room in no acute distress. MENTAL STATUS: Alert and oriented to person, place and time. HEAD: Short gray wig. Normocephalic, atraumatic, face symmetric, no Cushingoid features. EYES: Brown eyes. Pupils equal round and reactive to light and accomodation. No conjunctivitis or scleral icterus. ENT: Oropharynx clear without lesion. Tongue normal. Mucous membranes moist.  NECK: Left neck cyst (stable). RESPIRATORY:  Clear to auscultation without rales, wheezes or rhonchi. CARDIOVASCULAR:  Regular rate and rhythm without murmur, rub or gallop. BREAST:Chest wall thickened. Coalesced necrotic open lesions over left anterior chest (area of abnormality extends over 7.5 cm x 9 cm). Several areas are deeper.  New palpable nodularity inferior.  Nipple on medial border.  Nodules (2 fingertip- 1.5 cm) in skin below left clavicle. Left lateral denuded triangular area (< 1 cm) plus similar sized hypopigmented area. ABDOMEN:  Soft, non-tender, with active bowel sounds, and no hepatosplenomegaly.  No masses. SKIN: Cutaneous breast cancer noted in breast section (progressive). No other lesions.  EXTREMITIES: No edema, no skin discoloration or tenderness. No palpable cords. LYMPH NODES: Left axillary ridge. No palpable cervical, supraclavicular, axillary or inguinal adenopathy  NEUROLOGICAL: Unremarkable.  PSYCH: Appropriate.   Appointment on 10/17/2015  Component Date Value Ref Range Status  . WBC 10/17/2015 4.1  3.6 - 11.0 K/uL Final  . RBC 10/17/2015 3.89  3.80 -  5.20 MIL/uL Final  . Hemoglobin 10/17/2015 12.2  12.0 - 16.0 g/dL Final  . HCT 10/17/2015 35.9  35.0 - 47.0 % Final  . MCV 10/17/2015 92.3  80.0 - 100.0 fL Final  . MCH 10/17/2015 31.2  26.0 - 34.0 pg Final  . MCHC 10/17/2015 33.8  32.0 - 36.0 g/dL Final  . RDW 10/17/2015 21.7* 11.5 - 14.5 % Final  . Platelets 10/17/2015 193  150 - 440 K/uL Final  . Neutrophils Relative % 10/17/2015 55   Final  . Neutro Abs 10/17/2015 2.2  1.4 - 6.5 K/uL Final  . Lymphocytes Relative 10/17/2015 28   Final  . Lymphs Abs 10/17/2015 1.2  1.0 - 3.6 K/uL Final  . Monocytes Relative 10/17/2015 16   Final  . Monocytes Absolute 10/17/2015 0.7  0.2 - 0.9 K/uL Final  . Eosinophils Relative 10/17/2015 0   Final  . Eosinophils Absolute 10/17/2015 0.0  0 - 0.7 K/uL Final  . Basophils Relative 10/17/2015 1  Final  . Basophils Absolute 10/17/2015 0.0  0 - 0.1 K/uL Final  . Sodium 10/17/2015 135  135 - 145 mmol/L Final  . Potassium 10/17/2015 3.8  3.5 - 5.1 mmol/L Final  . Chloride 10/17/2015 101  101 - 111 mmol/L Final  . CO2 10/17/2015 25  22 - 32 mmol/L Final  . Glucose, Bld 10/17/2015 134* 65 - 99 mg/dL Final  . BUN 10/17/2015 13  6 - 20 mg/dL Final  . Creatinine, Ser 10/17/2015 0.89  0.44 - 1.00 mg/dL Final  . Calcium 10/17/2015 9.4  8.9 - 10.3 mg/dL Final  . Total Protein 10/17/2015 8.0  6.5 - 8.1 g/dL Final  . Albumin 10/17/2015 3.9  3.5 - 5.0 g/dL Final  . AST 10/17/2015 24  15 - 41 U/L Final  . ALT 10/17/2015 20  14 - 54 U/L Final  . Alkaline Phosphatase 10/17/2015 101  38 - 126 U/L Final  . Total Bilirubin 10/17/2015 0.2* 0.3 - 1.2 mg/dL Final  . GFR calc non Af Amer 10/17/2015 >60  >60 mL/min Final  . GFR calc Af Amer 10/17/2015 >60  >60 mL/min Final   Comment: (NOTE) The eGFR has been calculated using the CKD EPI equation. This calculation has not been validated in all clinical situations. eGFR's persistently <60 mL/min signify possible Chronic Kidney Disease.   . Anion gap 10/17/2015 9  5 - 15  Final    Assessment:  RAMIYA DELAHUNTY is a 65 y.o. African American woman with a history of stage I right breast cancer s/p modified radical mastectomy on 06/12/1996. Pathology revealed a grade III mutifocal infiltrating ductal carcinoma (tumor size 8.5 cm with an invasive component 1.6 cm). Twenty-one lymph nodes were negative. Tumor was ER/PR negative. She received 4 cycles [facility] AC [facility].  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 PET scan on 05/22/2012 revealed multiple areas of disease. She received 6 cycles of Taxotere and Cytoxan (TC) from 06/06/2012 - 09/29/2012. PET scan on 09/07/2012 noted marked improvement. Post chemotherapy biopsy was positive. She was not a surgical candidate. She was placed on Femara.  PET scan on 05/03/2013 revealed a new focus along lateral left breast and a new level II cervical lymph node. She received radiation from 09/13/2013 - 11/05/2013. PET scan on 01/01/2014 revealed new nodule medial left chest wall pleural/paraspinal activity LUL. Multiple biopsies on 01/19/2014 were positive. She began daily letrozole and Ibrance on 02/12/2014. Despite treatment, new nodules formed. She received a total of 2 cycles (delays in therapy).   PET scan on 05/09/2014 revealed marked progression of disease with multifocal masses in chest wall (left > right). She did not receive Imbrance in 04/2014. CA27.29 has increased: 22.9 on 01/08/2014, 54 on 03/05/2014, 170.8 on 05/06/2014, 250.8 on 05/23/2014, 445.8 on 06/14/2014, and 531.1 on 07/02/2014.   She received 3 cycles of Xeloda (02/12 - 07/05/2014). Chest and abdomen CT scan on 07/05/2014 revealed persistent and slightly progressive diffuse locally invasive chest wall tumor. Bone scan on 07/25/2014 revealed no evidence of metastatic disease.   She received 8 cycles of [place] (08/06/2014 -  01/07/2015) on ACCRU [id number] I. 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  Chest, abdomen, and pelvic CT scan on 10/24/2014 revealed improved multifocal inflammatory left breast cancer.  The dominant 2.6 x 3.5 cm lesion in the left upper outer left breast had decreased to 1.7 x 3.5 cm. The anterior sternal lesion has  decreased from 2 x 4 cm to 1.5 x 3.9 cm. There were no suspicious mediastinal, hilar or axillary adenopathy.    Chest, abdomen, and pelvic CT scan on 01/20/2015 revealed inflammatory left breast cancer with new necrotic appearing subcutaneous nodules in the lateral left breast and lateral left chest wall.   CA27.29 was 339.8 on 08/06/2014, 51.7 on 10/15/2014, 37.6 on 12/10/2014, 26.4 on 01/07/2015, 34.1 on 01/28/2015, and 49.3 on 03/10/2015,  258.6 on 06/12/2015, 421.3 on 07/10/2015, 726.3 on 07/25/2015, 911 on 08/14/2015, and 1031 on 09/17/2015.  She received 2 cycles of Taxol given 3 weeks on/1 week off (02/10/2015 - 03/10/2015).  Chest and abdomen CT scan on 04/03/2015 revealed interval growth of superficial tumors in the lateral left breast and lateral left chest wall.  In the left lateral chest, disease had increased from 2.3 x 2 to 2.9 x 2.2 cm.  In the superficial lateral left chest wall, disease had increased from 1.7 x 1.2 cm to 2.7 x 1.8 cm.  There were stable findings of locally infiltrative tumor in the left chest wall and left axilla, including extensive left chest wall skin thickening, infiltrative soft tissue encasing the left axillary artery and focal pleural thickening in the anterior left mid pleural space.  There was no metastatic disease in the abdomen.  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  Estrogen receptor was > 90%, progesterone receptor was > 90%, and Her2/neu was 1+.    [employer] testing on 05/09/2015 noted genomic alterations of PIK3CA (Z6109U), PTEN (splice site [id number]),  ESR1 (Y537C), CDH1 (A634V), CDKN1B (K67f*47), MLL2 (R1903*-subclonal), and MSH6 ([id number]). Treatment options of Faslodex (ESR1) and everolimus (approved) and temsirolimus (approved therapy for other tumor types).  She began Faslodex on 05/08/2015 (last 08/14/2015). She is s/p 3 cycles of Ibrance (07/26/2015 - 09/19/2015).  Chest, abdomen, and pelvic CT scans on 07/09/2015 revealed interval progression of disease in the left anterior and lateral chest wall.  There was interval development of nodular pleural thickening in the posterior medial left hemi thorax with some focal fluid in the left major fissure. Findings raised concern for interval development of pleural involvement by tumor.  There was no evidence for metastatic disease in the abdomen   Symptomatically, she has increased chest wall nodularity. Pain is modestly controlled with Tramadol.  Plan: 1.  Labs today: CBC with diff, CMP, CA27.29, Mg. 2.  Discuss concern for progressive disease.  Discuss postponing next cycle of Ibrance and Faslodex.  Discuss reimaging.  Discuss possible initiation of Aromasin and everolimus.  Discuss conversation with radiation oncology. 3.  No Faslodex today. 4.  Hold Ibrance. 5.  Medical photographs today. 6.  Encourage smoking cessation. 7.  Chest, abdomen, and pelvic CT re: restaging breast cancer. 8.68 Consult Dr. ChBaruch Goutyor re-evaluation. 9.  RTC after CT scans.    MeLequita AsalMD  10/17/2015, 11:46 AM

## 2015-10-17 NOTE — Progress Notes (Signed)
[facility] day:  09/17/2015   Chief Complaint: [name] [name] is a 65 y.o. female with recurrent breast cancer who is seen for assessment prior to monthly Faslodex and day 27 of cycle #2 Ibrance.  HPI: The patient was last seen in the medical oncology clinic on 08/14/2015.  At that time, she had significant chest wall nodularity. Pain was controlled with Tramadol.  She was on day 20 of cycle #1 Ibrance and Faslodex.  CA27.29 was 911.  She notes that she starts her next cycle of Ibrance on 09/19/2015.  She notes that some areas are healing.  She has gone to the wound clinic.  Left chest wall is painful, but well managed with Tramadol.   Symptomatically, she notes a cold which started on 09/12/2015.  She felt hot the following day.  She went to the ER on 09/13/2015 with dizziness.  Symptoms occurred when getting out of bed and were transient.  EKG was normal.  She was discharged without imaging.   Past Medical History  Diagnosis Date  . Personal history of malignant neoplasm of breast   . Anemia   . Blood transfusion without reported diagnosis   . Emphysema of lung ([place])   . History of left breast cancer , known active 2013    Past Surgical History  Procedure Laterality Date  . Oophrectomy  1980  . Abdominal hysterectomy  1995  . Breast surgery Right 1996    mastectomy  . Breast biopsy Left 2014  . Breast biopsy Left 01-21-14  . Nephrectomy      Family History  Problem Relation Age of Onset  . Cancer Father   . Heart disease Sister   . Diabetes Sister   . Heart disease Brother   . Cancer Paternal Grandmother     breast    Social History:  reports that she has been smoking Cigarettes.  She has a 6.25 pack-year smoking history. She has never used smokeless tobacco. She reports that she does not drink alcohol or use illicit drugs.  She is alone today.  Allergies:  Allergies  Allergen Reactions  . Sulfa Antibiotics Nausea And  Vomiting and Other (See Comments)    headache    Current Medications: Current Outpatient Prescriptions  Medication Sig Dispense Refill  . Calcium Carb-Cholecalciferol (CALCIUM 600 + D PO) Take 1 tablet by mouth 2 (two) times daily.    Marland Kitchen KLOR-CON 10 10 MEQ tablet TAKE 1 TABLET (10 MEQ TOTAL) BY MOUTH DAILY. 30 tablet 1  . LORazepam (ATIVAN) 0.5 MG tablet Take 0.5 mg by mouth daily as needed for anxiety.   0  . palbociclib (IBRANCE) 125 MG capsule Take 1 capsule (125 mg total) by mouth daily with breakfast. Take whole with food. 21 capsule 2  . traMADol (ULTRAM) 50 MG tablet Take 1 tablet (50 mg total) by mouth every 8 (eight) hours as needed. 90 tablet 0   No current facility-administered medications for this visit.   Facility-Administered Medications Ordered in Other Visits  Medication Dose Route Frequency Provider Last Rate Last Dose  . sodium chloride 0.9 % injection 10 mL  10 mL Intravenous PRN Lequita Asal, MD   10 mL at 10/08/14 1335  . sodium chloride 0.9 % injection 10 mL  10 mL Intracatheter PRN Lequita Asal, MD   10 mL at 11/26/14 1355    Review of Systems:  GENERAL:  Feels "ok".  No fevers or sweats.  Weight  down 2 pounds. PERFORMANCE STATUS (ECOG):  1 HEENT:  No visual changes, runny nose, sore throat, mouth sores or tenderness. Lungs: No shortness of breath.  No cough.  No hemoptysis. Cardiac:  No chest pain, palpitations, orthopnea, or PND. GI:  Appetite 50%.  Constipation.  No nausea, vomiting, diarrhea, melena or hematochezia. GU:  No urgency, frequency, dysuria, or hematuria. Musculoskeletal:  No back pain.  No joint pain.  No muscle tenderness. Extremities:  No pain or swelling. Skin:  Hair thin.  Chest wall lesions (see HPI).  No rashes or skin changes. 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  No headache, numbness or weakness, or coordination issues. Endocrine:  No diabetes, thyroid issues, hot flashes or night sweats. Psych:  Emotional issues  secondary to family.  Pain:  Chest wall lesions tenderness.  Pain controlled with Tramadol. Review of systems:  All other systems reviewed and found to be negative.   Physical Exam: Blood pressure 132/83, pulse 93, temperature 97.4 F (36.3 C), resp. rate 18, height 5' 7"  (1.702 m), weight 158 lb 11.7 oz (72 kg).  GENERAL: Well developed, well nourished, woman sitting comfortably in the exam room in no acute distress. MENTAL STATUS: Alert and oriented to person, place and time. HEAD: Short gray wig. Normocephalic, atraumatic, face symmetric, no Cushingoid features. EYES: Brown eyes. Pupils equal round and reactive to light and accomodation. No conjunctivitis or scleral icterus. ENT: Oropharynx clear without lesion. Tongue normal. Mucous membranes moist.  NECK: Left neck cyst (stable). BREAST: Right chest wall lesions healed. Central upper sternal area flat. Left 1.5 cm axillary nodule (stable). Chest wall thickened. Coalesced necrotic open lesions over anterior chest s/p debridement (area of abnormality extends over 8 cm x 11 cm). Nipple on medial border.  Nodules (2 fingertip- 1.5 cm) in skin below left clavicle. Left lateral denuded triangular area, healing and < 1 cm plus similar sized hypopigmented area. SKIN: Cutaneous breast cancer noted in breast section. No other lesions.  EXTREMITIES: No edema, no skin discoloration or tenderness. No palpable cords. LYMPH NODES: Left axillary ridge. No palpable cervical, supraclavicular, axillary or inguinal adenopathy  NEUROLOGICAL: Unremarkable.  PSYCH: Appropriate.   Office Visit on 09/17/2015  Component Date Value Ref Range Status  . WBC 09/17/2015 3.7  3.6 - 11.0 K/uL Final  . RBC 09/17/2015 4.04  3.80 - 5.20 MIL/uL Final  . Hemoglobin 09/17/2015 11.9* 12.0 - 16.0 g/dL Final  . HCT 09/17/2015 35.9  35.0 - 47.0 % Final  . MCV 09/17/2015 89.0  80.0 - 100.0 fL Final  . MCH 09/17/2015 29.5  26.0 - 34.0 pg Final  . MCHC  09/17/2015 33.2  32.0 - 36.0 g/dL Final  . RDW 09/17/2015 18.2* 11.5 - 14.5 % Final  . Platelets 09/17/2015 151  150 - 440 K/uL Final  . Neutrophils Relative % 09/17/2015 50%   Final  . Neutro Abs 09/17/2015 1.8  1.4 - 6.5 K/uL Final  . Lymphocytes Relative 09/17/2015 34%   Final  . Lymphs Abs 09/17/2015 1.2  1.0 - 3.6 K/uL Final  . Monocytes Relative 09/17/2015 15%   Final  . Monocytes Absolute 09/17/2015 0.6  0.2 - 0.9 K/uL Final  . Eosinophils Relative 09/17/2015 0%   Final  . Eosinophils Absolute 09/17/2015 0.0  0 - 0.7 K/uL Final  . Basophils Relative 09/17/2015 1%   Final  . Basophils Absolute 09/17/2015 0.0  0 - 0.1 K/uL Final  . Sodium 09/17/2015 139  135 - 145 mmol/L Final  .  Potassium 09/17/2015 3.7  3.5 - 5.1 mmol/L Final  . Chloride 09/17/2015 101  101 - 111 mmol/L Final  . CO2 09/17/2015 28  22 - 32 mmol/L Final  . Glucose, Bld 09/17/2015 98  65 - 99 mg/dL Final  . BUN 09/17/2015 14  6 - 20 mg/dL Final  . Creatinine, Ser 09/17/2015 0.99  0.44 - 1.00 mg/dL Final  . Calcium 09/17/2015 9.3  8.9 - 10.3 mg/dL Final  . Total Protein 09/17/2015 8.0  6.5 - 8.1 g/dL Final  . Albumin 09/17/2015 3.7  3.5 - 5.0 g/dL Final  . AST 09/17/2015 21  15 - 41 U/L Final  . ALT 09/17/2015 19  14 - 54 U/L Final  . Alkaline Phosphatase 09/17/2015 90  38 - 126 U/L Final  . Total Bilirubin 09/17/2015 0.3  0.3 - 1.2 mg/dL Final  . GFR calc non Af Amer 09/17/2015 60* >60 mL/min Final  . GFR calc Af Amer 09/17/2015 >60  >60 mL/min Final   Comment: (NOTE) The eGFR has been calculated using the CKD EPI equation. This calculation has not been validated in all clinical situations. eGFR's persistently <60 mL/min signify possible Chronic Kidney Disease.   . Anion gap 09/17/2015 10  5 - 15 Final  . CA 27.29 09/17/2015 1031.0* 0.0 - 38.6 U/mL Final   Comment: (NOTE) This test was developed and its performance characteristics determined by [facility]. It has not been cleared or approved by the Food and  Drug Administration. Research scientist (life sciences) Performed At: [facility] [address] [place], [place] [id number] Lindon Romp MD XB:[id number]     Assessment:  [name] [name] is a 65 y.o. African American woman with a history of stage I right breast cancer s/p modified radical mastectomy on 06/12/1996. Pathology revealed a grade III mutifocal infiltrating ductal carcinoma (tumor size 8.5 cm with an invasive component 1.6 cm). Twenty-one lymph nodes were negative. Tumor was ER/PR negative. She received 4 cycles of AC at [facility].  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 PET scan on 05/22/2012 revealed multiple areas of disease. She received 6 cycles of Taxotere and Cytoxan (TC) from 06/06/2012 - 09/29/2012. PET scan on 09/07/2012 noted marked improvement. Post chemotherapy biopsy was positive. She was not a surgical candidate. She was placed on Femara.  PET scan on 05/03/2013 revealed a new focus along lateral left breast and a new level II cervical lymph node. She received radiation from 09/13/2013 - 11/05/2013. PET scan on 01/01/2014 revealed new nodule medial left chest wall pleural/paraspinal activity LUL. Multiple biopsies on 01/19/2014 were positive. She began daily letrozole and Ibrance on 02/12/2014. Despite treatment, new nodules formed. She received a total of 2 cycles (delays in therapy).   PET scan on 05/09/2014 revealed marked progression of disease with multifocal masses in chest wall (left > right). She did not receive Imbrance in 04/2014. CA27.29 has increased: 22.9 on 01/08/2014, 54 on 03/05/2014, 170.8 on 05/06/2014, 250.8 on 05/23/2014, 445.8 on 06/14/2014, and 531.1 on 07/02/2014.   She received 3 cycles of Xeloda (02/12 - 07/05/2014). Chest and abdomen CT scan on 07/05/2014 revealed persistent and slightly progressive diffuse  locally invasive chest wall tumor. Bone scan on 07/25/2014 revealed no evidence of metastatic disease.   She received 8 cycles of [place] (08/06/2014 - 01/07/2015) on ACCRU [id number] I. 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  Chest, abdomen, and pelvic CT scan on 10/24/2014  revealed improved multifocal inflammatory left breast cancer.  The dominant 2.6 x 3.5 cm lesion in the left upper outer left breast had decreased to 1.7 x 3.5 cm. The anterior sternal lesion has decreased from 2 x 4 cm to 1.5 x 3.9 cm. There were no suspicious mediastinal, hilar or axillary adenopathy.    Chest, abdomen, and pelvic CT scan on 01/20/2015 revealed inflammatory left breast cancer with new necrotic appearing subcutaneous nodules in the lateral left breast and lateral left chest wall.   CA27.29 was 339.8 on 08/06/2014, 51.7 on 10/15/2014, 37.6 on 12/10/2014, 26.4 on 01/07/2015, 34.1 on 01/28/2015, and 49.3 on 03/10/2015,  258.6 on 06/12/2015, 421.3 on 07/10/2015, 726.3 on 07/25/2015, 911 on 08/14/2015, and 1031 on 09/17/2015.  She received 2 cycles of Taxol given 3 weeks on/1 week off (02/10/2015 - 03/10/2015).  Chest and abdomen CT scan on 04/03/2015 revealed interval growth of superficial tumors in the lateral left breast and lateral left chest wall.  In the left lateral chest, disease had increased from 2.3 x 2 to 2.9 x 2.2 cm.  In the superficial lateral left chest wall, disease had increased from 1.7 x 1.2 cm to 2.7 x 1.8 cm.  There were stable findings of locally infiltrative tumor in the left chest wall and left axilla, including extensive left chest wall skin thickening, infiltrative soft tissue encasing the left axillary artery and focal pleural thickening in the anterior left mid pleural space.  There was no metastatic disease in the abdomen.  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  Estrogen receptor was > 90%, progesterone  receptor was > 90%, and Her2/neu was 1+.    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 Treatment options of Faslodex (ESR1) and everolimus (approved) and temsirolimus (approved therapy for other tumor types).  She began Faslodex on 05/08/2015 (last 08/14/2015). She is currently day 27 of cycle #2 Ibrance (07/26/2015 - 08/22/2015).  Chest, abdomen, and pelvic CT scans on 07/09/2015 revealed interval progression of disease in the left anterior and lateral chest wall.  There was interval development of nodular pleural thickening in the posterior medial left hemi thorax with some focal fluid in the left major fissure. Findings raised concern for interval development of pleural involvement by tumor.  There was no evidence for metastatic disease in the abdomen   Symptomatically, she has significant chest wall nodularity. Pain is controlled with Tramadol.  Plan: 1.  Labs today: CBC with diff, CMP, CA27.29, Mg. 2.  Begin next cycle of Ibrance on 09/19/2015.   3.  Medical photographs today. 4.  Schedule follow-up with Dr. ByBary Castillae: wound. 5.  Discuss limited options.  Anticipate imaging after next cycle. 6.  Discuss with radiation oncology. 7.  RTC on 09/19/2015 for Faslodex. 8.  RTC on 10/03/2015 for CBC with diff. 9.  RTC on 10/17/2015 for MD assess, labs (CBC with diff, CMP, CA27.29), and Faslodex.    MeLequita AsalMD  09/17/2015

## 2015-10-18 LAB — CANCER ANTIGEN 27.29: CA 27.29: 1242.1 U/mL — ABNORMAL HIGH (ref 0.0–38.6)

## 2015-10-20 ENCOUNTER — Ambulatory Visit: Admission: RE | Admit: 2015-10-20 | Payer: Medicare Other | Source: Ambulatory Visit

## 2015-10-20 ENCOUNTER — Ambulatory Visit
Admission: RE | Admit: 2015-10-20 | Discharge: 2015-10-20 | Disposition: A | Discharge status: Home / Self Care | Payer: Medicare Other | Source: Ambulatory Visit | Attending: Hematology and Oncology | Admitting: Hematology and Oncology

## 2015-10-20 DIAGNOSIS — R590 Localized enlarged lymph nodes: Secondary | ICD-10-CM | POA: Diagnosis not present

## 2015-10-20 DIAGNOSIS — R918 Other nonspecific abnormal finding of lung field: Secondary | ICD-10-CM | POA: Insufficient documentation

## 2015-10-20 DIAGNOSIS — C50112 Malignant neoplasm of central portion of left female breast: Secondary | ICD-10-CM | POA: Insufficient documentation

## 2015-10-20 DIAGNOSIS — I313 Pericardial effusion (noninflammatory): Secondary | ICD-10-CM | POA: Insufficient documentation

## 2015-10-20 DIAGNOSIS — G893 Neoplasm related pain (acute) (chronic): Secondary | ICD-10-CM | POA: Diagnosis present

## 2015-10-20 MED ORDER — IOPAMIDOL (ISOVUE-300) INJECTION 61%
100.0000 mL | Freq: Once | INTRAVENOUS | Status: AC | PRN
  Administered 2015-10-20: 100 mL via INTRAVENOUS

## 2015-10-21 ENCOUNTER — Encounter: Payer: Self-pay | Admitting: *Deleted

## 2015-10-21 ENCOUNTER — Encounter: Payer: Self-pay | Admitting: Hematology and Oncology

## 2015-10-21 ENCOUNTER — Inpatient Hospital Stay (HOSPITAL_BASED_OUTPATIENT_CLINIC_OR_DEPARTMENT_OTHER): Payer: Medicare Other | Admitting: Hematology and Oncology

## 2015-10-21 ENCOUNTER — Encounter: Payer: Medicare Other | Attending: Nurse Practitioner | Admitting: Nurse Practitioner

## 2015-10-21 VITALS — BP 117/78 | HR 103 | Temp 98.3°F | Resp 18 | Wt 154.3 lb

## 2015-10-21 DIAGNOSIS — S21001A Unspecified open wound of right breast, initial encounter: Secondary | ICD-10-CM | POA: Insufficient documentation

## 2015-10-21 DIAGNOSIS — Z170 Estrogen receptor positive status [ER+]: Secondary | ICD-10-CM

## 2015-10-21 DIAGNOSIS — G629 Polyneuropathy, unspecified: Secondary | ICD-10-CM

## 2015-10-21 DIAGNOSIS — Z171 Estrogen receptor negative status [ER-]: Secondary | ICD-10-CM

## 2015-10-21 DIAGNOSIS — X58XXXA Exposure to other specified factors, initial encounter: Secondary | ICD-10-CM | POA: Insufficient documentation

## 2015-10-21 DIAGNOSIS — G893 Neoplasm related pain (acute) (chronic): Secondary | ICD-10-CM | POA: Diagnosis not present

## 2015-10-21 DIAGNOSIS — C50112 Malignant neoplasm of central portion of left female breast: Secondary | ICD-10-CM

## 2015-10-21 DIAGNOSIS — I959 Hypotension, unspecified: Secondary | ICD-10-CM | POA: Diagnosis not present

## 2015-10-21 DIAGNOSIS — C50912 Malignant neoplasm of unspecified site of left female breast: Secondary | ICD-10-CM | POA: Diagnosis not present

## 2015-10-21 DIAGNOSIS — C50412 Malignant neoplasm of upper-outer quadrant of left female breast: Secondary | ICD-10-CM | POA: Diagnosis not present

## 2015-10-21 DIAGNOSIS — C50812 Malignant neoplasm of overlapping sites of left female breast: Secondary | ICD-10-CM | POA: Insufficient documentation

## 2015-10-21 DIAGNOSIS — Z9221 Personal history of antineoplastic chemotherapy: Secondary | ICD-10-CM | POA: Insufficient documentation

## 2015-10-21 DIAGNOSIS — S21002A Unspecified open wound of left breast, initial encounter: Secondary | ICD-10-CM | POA: Diagnosis not present

## 2015-10-21 DIAGNOSIS — Z9011 Acquired absence of right breast and nipple: Secondary | ICD-10-CM

## 2015-10-21 DIAGNOSIS — Z853 Personal history of malignant neoplasm of breast: Secondary | ICD-10-CM

## 2015-10-21 DIAGNOSIS — F17210 Nicotine dependence, cigarettes, uncomplicated: Secondary | ICD-10-CM

## 2015-10-21 MED ORDER — ONDANSETRON HCL 8 MG PO TABS: 8.0000 mg | ORAL_TABLET | Freq: Three times a day (TID) | ORAL | Status: DC | PRN

## 2015-10-21 MED ORDER — EVEROLIMUS 10 MG PO TABS: 10.0000 mg | ORAL_TABLET | Freq: Every day | ORAL | Status: DC

## 2015-10-21 MED ORDER — EXEMESTANE 25 MG PO TABS: 25.0000 mg | ORAL_TABLET | Freq: Every day | ORAL | Status: DC

## 2015-10-21 NOTE — Patient Instructions (Signed)
Everolimus tablets What is this medicine? EVEROLIMUS (eve ROE li mus) decreases the activity of your immune system. Afinitor is used to treat certain types of cancer. Zortress is used for kidney and liver transplant rejection prophylaxis. This medicine may be used for other purposes; ask your health care provider or pharmacist if you have questions. What should I tell my health care provider before I take this medicine? They need to know if you have any of these conditions: -diabetes -heart disease -high cholesterol -immune system problems -infection (especially a virus infection such as chickenpox, cold sores, or herpes) -kidney disease -liver disease -low blood counts, like low white cell, platelet, or red cell counts -rare hereditary problems of galactose intolerance, the Lapp lactase deficiency, or glucose-galactose malabsorption -an unusual or allergic reaction to everolimus, other medicines, foods, dyes, or preservatives -pregnant or trying to get pregnant -breast-feeding How should I use this medicine? Take this medicine by mouth with a full glass of water. Follow the directions on the prescription label. You can take this medicine with or without food, but always take Zortress the same way. Do not cut, crush, or chew this medicine. Do not take with grapefruit juice. Take your medicine at regular intervals. Do not take it more often than directed. Do not stop taking except on your doctor's advice. Talk to your pediatrician regarding the use of this medicine in children. Special care may be needed. Overdosage: If you think you have taken too much of this medicine contact a poison control center or emergency room at once. NOTE: This medicine is only for you. Do not share this medicine with others. What if I miss a dose? If you miss a dose, take it as soon as you can. If it is almost time for your next dose, take only that dose. Do not take double or extra doses. What may interact with  this medicine? This medicine may interact with the following medications: -antiviral medicines for HIV or AIDS -aprepitant -carbamazepine -certain medicines for cholesterol like simvastatin -clarithromycin -cyclosporine -dexamethasone -diltiazem -erythromycin -fluconazole -grapefruit juice -itraconazole -ketoconazole -live vaccines -nefazodone -nicardipine -phenobarbital -phenytoin -rifabutin -rifampin -telithromycin -verapamil -voriconazole This list may not describe all possible interactions. Give your health care provider a list of all the medicines, herbs, non-prescription drugs, or dietary supplements you use. Also tell them if you smoke, drink alcohol, or use illegal drugs. Some items may interact with your medicine. What should I watch for while using this medicine? This drug may make you feel generally unwell. This is not uncommon, as chemotherapy can affect healthy cells as well as cancer cells. Report any side effects. Continue your course of treatment even though you feel ill unless your doctor tells you to stop. Visit your doctor or health care professional for regular check-ups. You may need regular tests to monitor possible side effects of the drug. This medicine may affect blood sugar levels. If you have diabetes, check with your doctor or health care professional before you change your diet or the dose of your diabetic medicine. Do not become pregnant while taking this medicine or for 8 weeks after stopping it. Women should inform their doctor if they wish to become pregnant or think they might be pregnant. There is a potential for serious side effects to an unborn child. Talk to your health care professional or pharmacist for more information. Do not breast-feed an infant while taking this medicine or for 2 weeks after stopping it. This medicine has caused ovarian failure in some women.  This medicine may interfere with the ability to have a child. You should talk with  your doctor or health care professional if you are concerned about your fertility. This medicine has caused reduced sperm counts in some men. This may interfere with the ability to father a child. You should talk to your doctor or health care professional if you are concerned about your fertility. Call your doctor or health care professional for advice if you get a fever, chills or sore throat, or other symptoms of a cold or flu. Do not treat yourself. This drug decreases your body's ability to fight infections. Try to avoid being around people who are sick. This medicine may increase your risk to bruise or bleed. Call your doctor or health care professional if you notice any unusual bleeding. If you have had a kidney transplant, immediately tell your doctor if your incision site is red, warm, or painful. Also, tell your doctor if your incision site opens up or swells or if contains blood, fluid, or pus. Keep out of the sun. If you cannot avoid being in the sun, wear protective clothing and use sunscreen. Do not use sun lamps or tanning beds/booths. What side effects may I notice from receiving this medicine? Side effects that you should report to your doctor or health care professional as soon as possible: -allergic reactions like skin rash, itching or hives, swelling of the face, lips, or tongue -breathing problems -chest pain -cough -dark urine -fever or chills, sore throat -increased hunger or thirst -increased urination -nausea, vomiting -stomach pain -swelling of ankles, feet, hands -trouble passing urine or change in the amount of urine -unusual bleeding or bruising -unusually weak or tired Side effects that usually do not require medical attention (Report these to your doctor or health care professional if they continue or are bothersome.): -constipation -diarrhea -dizziness -dry mouth or mouth sores -headache -nausea, vomiting This list may not describe all possible side  effects. Call your doctor for medical advice about side effects. You may report side effects to FDA at 1-800-FDA-1088. Where should I keep my medicine? Keep out of the reach of children. Store at room temperature between 15 and 30 degrees C (59 and 86 degrees F). Throw away any unused medicine after the expiration date. NOTE: This sheet is a summary. It may not cover all possible information. If you have questions about this medicine, talk to your doctor, pharmacist, or health care provider.    2016, Elsevier/Gold Standard. (2014-05-21 17:53:48) Exemestane tablets What is this medicine? EXEMESTANE (ex e MES tane) blocks the production of the hormone estrogen. Some types of breast cancer depend on estrogen to grow, and this medicine can stop tumor growth by blocking estrogen production. This medicine is for the treatment of breast cancer in postmenopausal women only. This medicine may be used for other purposes; ask your health care provider or pharmacist if you have questions. What should I tell my health care provider before I take this medicine? They need to know if you have any of these conditions: -an unusual or allergic reaction to exemestane, other medicines, foods, dyes, or preservatives -pregnant or trying to get pregnant -breast-feeding How should I use this medicine? Take this medicine by mouth with a glass of water. Follow the directions on the prescription label. Take your doses at regular intervals after a meal. Do not take your medicine more often than directed. Do not stop taking except on the advice of your doctor or health care professional. Contact your  pediatrician regarding the use of this medicine in children. Special care may be needed. Overdosage: If you think you have taken too much of this medicine contact a poison control center or emergency room at once. NOTE: This medicine is only for you. Do not share this medicine with others. What if I miss a dose? If you miss a  dose, take the next dose as usual. Do not try to make up the missed dose. Do not take double or extra doses. What may interact with this medicine? Do not take this medicine with any of the following medications: -female hormones, like estrogens and birth control pills This medicine may also interact with the following medications: -androstenedione -phenytoin -rifabutin, rifampin, or rifapentine -St. John's Wort This list may not describe all possible interactions. Give your health care provider a list of all the medicines, herbs, non-prescription drugs, or dietary supplements you use. Also tell them if you smoke, drink alcohol, or use illegal drugs. Some items may interact with your medicine. What should I watch for while using this medicine? Visit your doctor or health care professional for regular checks on your progress. If you experience hot flashes or sweating while taking this medicine, avoid alcohol, smoking and drinks with caffeine. This may help to decrease these side effects. What side effects may I notice from receiving this medicine? Side effects that you should report to your doctor or health care professional as soon as possible: -any new or unusual symptoms -changes in vision -fever -leg or arm swelling -pain in bones, joints, or muscles -pain in hips, back, ribs, arms, shoulders, or legs Side effects that usually do not require medical attention (report to your doctor or health care professional if they continue or are bothersome): -difficulty sleeping -headache -hot flashes -sweating -unusually weak or tired This list may not describe all possible side effects. Call your doctor for medical advice about side effects. You may report side effects to FDA at 1-800-FDA-1088. Where should I keep my medicine? Keep out of the reach of children. Store at room temperature between 15 and 30 degrees C (59 and 86 degrees F). Throw away any unused medicine after the expiration  date. NOTE: This sheet is a summary. It may not cover all possible information. If you have questions about this medicine, talk to your doctor, pharmacist, or health care provider.    2016, Elsevier/Gold Standard. (2007-08-01 11:48:29)

## 2015-10-21 NOTE — Progress Notes (Signed)
Pt given info about afinitor 10 mg daily.  She was educated on taking dose same time every day, swallowing whole-no crushing or breaking of tablet. She was given list of side effects including low hfb, low wbc, inc.liver enzymes, low phosphorus, low plt count.  Can inc. Trig. And chol..  Advised pt that labs will be checked on regular basis to make sure these labs are stable.  Also pt can get mouth sores, diarrhea, n/v, fatigue.  Patient has entire list of all side effects and was told to call if she has any questions or side effects of the drug.  Was counseled on imodium for diarrhea, nausea med for nausea/vomiting, baking soda/water swish and gargle and spit for mouth sores and to call for any of these side effects.  [facility].  Anticipate med to be shipped by next Monday and pt instructed to call when she gets the med to have official start date and pt verbalizes understanding of above

## 2015-10-21 NOTE — Progress Notes (Signed)
Patient is here for follow up, no complaints  

## 2015-10-21 NOTE — Progress Notes (Signed)
[facility] day:  10/21/2015   Chief Complaint: [name] [name] is a 65 y.o. female with recurrent breast cancer who is seen for review of interval studies and discussion regarding direction of therapy.  HPI: The patient was last seen in the medical oncology clinic on 10/17/2015.  At that time, she had increased chest wall nodularity.  Pain was modestly controlled with Tramadol.  We discussed postponing her next cycle of Ibrance and Faslodex.  We discussed everolimus and Aromasin.  Restaging studies were ordered.  Chest, abdomen, and pelvic CT scan on 10/20/2015 revealed progression of subcarinal lymph node (now 1.1 cm).  There was the interval development of trace pericardial effusion with some pericardial nodularity along the left heart border.  There was progression of fissural fluid in the left hemi thorax with increase in left pleural nodularity.  There was progression of left antral lateral chest wall disease with invasion of the left pectoralis major muscle.  Subcutaneous metastatic nodules had progressed.  Symptomatically, she feels "the same".  She has shortness of breath with exertion.  Appetite is poor.  She is drinking Ensure.  She had some diarrhea yesterday.  She is taking tramadol 50 mg every 8 hours.    Past Medical History  Diagnosis Date  . Personal history of malignant neoplasm of breast   . Anemia   . Blood transfusion without reported diagnosis   . Emphysema of lung ([place])   . History of left breast cancer , known active 2013    Past Surgical History  Procedure Laterality Date  . Oophrectomy  1980  . Abdominal hysterectomy  1995  . Breast surgery Right 1996    mastectomy  . Breast biopsy Left 2014  . Breast biopsy Left 01-21-14  . Nephrectomy      Family History  Problem Relation Age of Onset  . Cancer Father   . Heart disease Sister   . Diabetes Sister   . Heart disease Brother   . Cancer Paternal Grandmother      breast    Social History:  reports that she has been smoking Cigarettes.  She has a 6.25 pack-year smoking history. She has never used smokeless tobacco. She reports that she does not drink alcohol or use illicit drugs.  She is smoking 1/2 pack per day.  She is accompanied by her daughter Jenetta Downer today.  Allergies:  Allergies  Allergen Reactions  . Sulfa Antibiotics Nausea And Vomiting and Other (See Comments)    headache    Current Medications: Current Outpatient Prescriptions  Medication Sig Dispense Refill  . Calcium Carb-Cholecalciferol (CALCIUM 600 + D PO) Take 1 tablet by mouth 2 (two) times daily.    Marland Kitchen everolimus (AFINITOR) 10 MG tablet Take 1 tablet (10 mg total) by mouth daily. 30 tablet 1  . exemestane (AROMASIN) 25 MG tablet Take 1 tablet (25 mg total) by mouth daily after breakfast. 30 tablet 1  . KLOR-CON 10 10 MEQ tablet TAKE 1 TABLET (10 MEQ TOTAL) BY MOUTH DAILY. 30 tablet 1  . LORazepam (ATIVAN) 0.5 MG tablet Take 0.5 mg by mouth daily as needed for anxiety.   0  . palbociclib (IBRANCE) 125 MG capsule Take 1 capsule (125 mg total) by mouth daily with breakfast. Take whole with food. 21 capsule 2  . traMADol (ULTRAM) 50 MG tablet Take 1 tablet (50 mg total) by mouth every 8 (eight) hours as needed. 90 tablet 0   No current facility-administered medications  for this visit.   Facility-Administered Medications Ordered in Other Visits  Medication Dose Route Frequency Provider Last Rate Last Dose  . sodium chloride 0.9 % injection 10 mL  10 mL Intravenous PRN Lequita Asal, MD   10 mL at 10/08/14 1335  . sodium chloride 0.9 % injection 10 mL  10 mL Intracatheter PRN Lequita Asal, MD   10 mL at 11/26/14 1355    Review of Systems:  GENERAL:  Feels "the same".  No fevers or sweats.  Weight down 1 pounds. PERFORMANCE STATUS (ECOG):  1 HEENT:  No visual changes, runny nose, sore throat, mouth sores or tenderness. Lungs: Mild shortness of breath with exertion.  No  cough.  No hemoptysis. Cardiac:  No chest pain, palpitations, orthopnea, or PND. GI:  Appetite 25%.  Drinking Ensure.  Constipation.  No nausea, vomiting, diarrhea, melena or hematochezia. GU:  No urgency, frequency, dysuria, or hematuria. Musculoskeletal:  No back pain.  No joint pain.  No muscle tenderness. Extremities:  No pain or swelling. Skin:  Hair thin.  Chest wall lesions enlarging.  No rashes or skin changes. 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  No headache, numbness or weakness, or coordination issues. Endocrine:  No diabetes, thyroid issues, hot flashes or night sweats. Psych:  Emotional issues secondary to family.  Pain:  Chest wall lesions tender.  Pain controlled with Tramadol. Review of systems:  All other systems reviewed and found to be negative.   Physical Exam: Blood pressure 117/78, pulse 103, temperature 98.3 F (36.8 C), temperature source Oral, resp. rate 18, weight 154 lb 5.2 oz (70 kg).  GENERAL: Well developed, well nourished, woman sitting comfortably in the exam room in no acute distress. MENTAL STATUS: Alert and oriented to person, place and time. HEAD: Short gray wig. Normocephalic, atraumatic, face symmetric, no Cushingoid features. EYES: Brown eyes. No conjunctivitis or scleral icterus. ENT: Oropharynx clear without lesion. Tongue normal. Mucous membranes moist.  NECK: Left neck cyst (stable). NEUROLOGICAL: Unremarkable.  PSYCH: Appropriate.   No visits with results within 3 Day(s) from this visit. Latest known visit with results is:  Appointment on 10/17/2015  Component Date Value Ref Range Status  . WBC 10/17/2015 4.1  3.6 - 11.0 K/uL Final  . RBC 10/17/2015 3.89  3.80 - 5.20 MIL/uL Final  . Hemoglobin 10/17/2015 12.2  12.0 - 16.0 g/dL Final  . HCT 10/17/2015 35.9  35.0 - 47.0 % Final  . MCV 10/17/2015 92.3  80.0 - 100.0 fL Final  . MCH 10/17/2015 31.2  26.0 - 34.0 pg Final  . MCHC 10/17/2015 33.8  32.0 - 36.0 g/dL Final  . RDW  10/17/2015 21.7* 11.5 - 14.5 % Final  . Platelets 10/17/2015 193  150 - 440 K/uL Final  . Neutrophils Relative % 10/17/2015 55   Final  . Neutro Abs 10/17/2015 2.2  1.4 - 6.5 K/uL Final  . Lymphocytes Relative 10/17/2015 28   Final  . Lymphs Abs 10/17/2015 1.2  1.0 - 3.6 K/uL Final  . Monocytes Relative 10/17/2015 16   Final  . Monocytes Absolute 10/17/2015 0.7  0.2 - 0.9 K/uL Final  . Eosinophils Relative 10/17/2015 0   Final  . Eosinophils Absolute 10/17/2015 0.0  0 - 0.7 K/uL Final  . Basophils Relative 10/17/2015 1   Final  . Basophils Absolute 10/17/2015 0.0  0 - 0.1 K/uL Final  . Sodium 10/17/2015 135  135 - 145 mmol/L Final  . Potassium 10/17/2015 3.8  3.5 -  5.1 mmol/L Final  . Chloride 10/17/2015 101  101 - 111 mmol/L Final  . CO2 10/17/2015 25  22 - 32 mmol/L Final  . Glucose, Bld 10/17/2015 134* 65 - 99 mg/dL Final  . BUN 10/17/2015 13  6 - 20 mg/dL Final  . Creatinine, Ser 10/17/2015 0.89  0.44 - 1.00 mg/dL Final  . Calcium 10/17/2015 9.4  8.9 - 10.3 mg/dL Final  . Total Protein 10/17/2015 8.0  6.5 - 8.1 g/dL Final  . Albumin 10/17/2015 3.9  3.5 - 5.0 g/dL Final  . AST 10/17/2015 24  15 - 41 U/L Final  . ALT 10/17/2015 20  14 - 54 U/L Final  . Alkaline Phosphatase 10/17/2015 101  38 - 126 U/L Final  . Total Bilirubin 10/17/2015 0.2* 0.3 - 1.2 mg/dL Final  . GFR calc non Af Amer 10/17/2015 >60  >60 mL/min Final  . GFR calc Af Amer 10/17/2015 >60  >60 mL/min Final   Comment: (NOTE) The eGFR has been calculated using the CKD EPI equation. This calculation has not been validated in all clinical situations. eGFR's persistently <60 mL/min signify possible Chronic Kidney Disease.   . Anion gap 10/17/2015 9  5 - 15 Final  . CA 27.29 10/17/2015 1242.1* 0.0 - 38.6 U/mL Final   Comment: (NOTE) This test was developed and its performance characteristics determined by [facility]. It has not been cleared or approved by the Food and Drug Administration. Engineer, site Performed At: [facility] [address] [place], [place] [id number] Lindon Romp MD NL:[id number]     Assessment:  [name] [name] is a 65 y.o. African American woman with a history of stage I right breast cancer s/p modified radical mastectomy on 06/12/1996. Pathology revealed a grade III mutifocal infiltrating ductal carcinoma (tumor size 8.5 cm with an invasive component 1.6 cm). Twenty-one lymph nodes were negative. Tumor was ER/PR negative. She received 4 cycles of AC at [facility].  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 PET scan on 05/22/2012 revealed multiple areas of disease. She received 6 cycles of Taxotere and Cytoxan (TC) from 06/06/2012 - 09/29/2012. PET scan on 09/07/2012 noted marked improvement. Post chemotherapy biopsy was positive. She was not a surgical candidate. She was placed on Femara.  PET scan on 05/03/2013 revealed a new focus along lateral left breast and a new level II cervical lymph node. She received radiation from 09/13/2013 - 11/05/2013. PET scan on 01/01/2014 revealed new nodule medial left chest wall pleural/paraspinal activity LUL. Multiple biopsies on 01/19/2014 were positive. She began daily letrozole and Ibrance on 02/12/2014. Despite treatment, new nodules formed. She received a total of 2 cycles (delays in therapy).   PET scan on 05/09/2014 revealed marked progression of disease with multifocal masses in chest wall (left > right). She did not receive Imbrance in 04/2014. CA27.29 has increased: 22.9 on 01/08/2014, 54 on 03/05/2014, 170.8 on 05/06/2014, 250.8 on 05/23/2014, 445.8 on 06/14/2014, and 531.1 on 07/02/2014.   She received 3 cycles of Xeloda (02/12 - 07/05/2014). Chest and abdomen CT scan on 07/05/2014 revealed persistent and slightly progressive diffuse locally invasive chest wall tumor. Bone  scan on 07/25/2014 revealed no evidence of metastatic disease.   She received 8 cycles of [place] (08/06/2014 - 01/07/2015) on ACCRU [id number] I. 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  Chest, abdomen, and pelvic CT scan on 10/24/2014 revealed improved multifocal inflammatory left breast cancer.  The dominant 2.6 x 3.5 cm lesion in the left upper outer left breast had decreased to 1.7 x 3.5 cm. The anterior sternal lesion has decreased from 2 x 4 cm to 1.5 x 3.9 cm. There were no suspicious mediastinal, hilar or axillary adenopathy.    Chest, abdomen, and pelvic CT scan on 01/20/2015 revealed inflammatory left breast cancer with new necrotic appearing subcutaneous nodules in the lateral left breast and lateral left chest wall.   CA27.29 was 339.8 on 08/06/2014, 51.7 on 10/15/2014, 37.6 on 12/10/2014, 26.4 on 01/07/2015, 34.1 on 01/28/2015, and 49.3 on 03/10/2015,  258.6 on 06/12/2015, 421.3 on 07/10/2015, 726.3 on 07/25/2015, 911 on 08/14/2015, 1031 on 09/17/2015, and 1242 on 10/17/2015.  She received 2 cycles of Taxol given 3 weeks on/1 week off (02/10/2015 - 03/10/2015).  Chest and abdomen CT scan on 04/03/2015 revealed interval growth of superficial tumors in the lateral left breast and lateral left chest wall.  In the left lateral chest, disease had increased from 2.3 x 2 to 2.9 x 2.2 cm.  In the superficial lateral left chest wall, disease had increased from 1.7 x 1.2 cm to 2.7 x 1.8 cm.  There were stable findings of locally infiltrative tumor in the left chest wall and left axilla, including extensive left chest wall skin thickening, infiltrative soft tissue encasing the left axillary artery and focal pleural thickening in the anterior left mid pleural space.  There was no metastatic disease in the abdomen.  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  Estrogen receptor was > 90%, progesterone receptor was > 90%, and  Her2/neu was 1+.    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 Treatment options of Faslodex (ESR1) and everolimus (approved) and temsirolimus (approved therapy for other tumor types).  She received Faslodex from 05/08/2015 - 09/19/2015. She received 3 cycles of Ibrance (07/26/2015 - 09/19/2015).  Chest, abdomen, and pelvic CT scans on 07/09/2015 revealed interval progression of disease in the left anterior and lateral chest wall.  There was interval development of nodular pleural thickening in the posterior medial left hemi thorax with some focal fluid in the left major fissure. Findings raised concern for interval development of pleural involvement by tumor.  There was no evidence for metastatic disease in the abdomen   Chest, abdomen, and pelvic CT scan on 10/20/2015 revealed progression of subcarinal lymph node (now 1.1 cm).  There was the interval development of trace pericardial effusion with some pericardial nodularity along the left heart border.  There was progression of fissural fluid in the left hemi thorax with increase in left pleural nodularity.  There was progression of left antral lateral chest wall disease with invasion of the left pectoralis major muscle.  Subcutaneous metastatic nodules had progressed.  Symptomatically, she has increased chest wall nodularity. Pain is controlled with Tramadol.  Plan: 1.  Discuss imaging studies.  Discuss progressive disease.  Discuss limited options.  Discuss trial of everolimus and Aromasin.  Side effects reviewed.  Prescriptions sent to her pharmacy.  Begin Aromasin today and everolimus when available.  Chemo teaching with nursing today.  Patient to call when she has the drug. 2.  [facility].  Patient would like to  follow-up with Dr. MaBeverley Fiedler3.  Begin Aromasin 25 mg a day. 4.  Assist with obtaining  dressing for left chest wall. 5.  Encourage smoking cessation. 6.  RTC in 2 weeks for MD assessment and labs (CBC with diff, CMP).    Lequita Asal, MD  10/21/2015, 11:31 AM

## 2015-10-22 NOTE — Progress Notes (Signed)
CARLIA, BOMKAMP ([id number]) Visit Report for 10/21/2015 Chief Complaint Document Details Patient Name: [name] [name], [name] [name] 10/21/2015 3:00 Date of Service: PM Medical Record [id number] Number: Patient Account Number: [id number] [birth year] (65 y.o. 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 07/18/2014 -- since last week the patient has had no complaints and is on her last cycle of chemotherapy. I have reviewed 155 pages of her reports from a medical oncologist and the summary is made in the history of present illness. Electronic Signature(s) Signed: 10/21/2015 5:44:16 PM By: Londell Moh FNP Entered By: Londell Moh on 10/21/2015 17:08:06 [name] [name] ([id number]) -------------------------------------------------------------------------------- HPI Details Patient Name: [name] [name], [name] [name] 10/21/2015 3:00 Date of Service: PM Medical Record [id number] Number: Patient Account Number: [id number] July 13, 1950 (65 y.o. Treating RN: Montey Hora Date of Birth/Sex: Female) Other Clinician: Primary Care Physician: PATIENT, NO Treating Londell Moh Referring Physician: Physician/Extender: Suella Grove in Treatment: 61 History of Present Illness HPI Description: this 65 year old patient has come by herself today as a self-referral and has no documentation with her. She has had open wounds to her left chest and the right chest wall for about 2 months. She is not a diabetic and has no significant medical history except breast cancer. Her right breast cancer was  diagnosed 18 years ago and she had a mastectomy and chemotherapy. 3 years ago she was then diagnosed with a breast cancer of the left side which was advanced and could not be operated and was given chemotherapy for a year and then followed by radiation. Her last radiation was over a year ago. She has not had any biopsy done recently from the ulcerated area but she says there have been other biopsies done from the chest wall and these reports are not available at the present time. She was told to keep the wounds open and let them dry out but she is finding it's more and more difficult to stop soiling her clothes if she leaves wounds open. She is on chemotherapy and we will try and obtain these notes from her oncologist. She is not in pain and does not have any significant problems except the discomfort from an open wound on the chest wall. 07/18/2014 This is a summary of [name] [name] date of birth 1950/06/18. These are obtained by reviewing 151 pages of medical records which were obtained from the [facility] 65 year old patient who had a history of stage I right breast cancer status post modified radical mastectomy on 06/12/1996. 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 She received 4 cycles of AC at [facility]. In January 2014 she presented with left breast swelling and apparently cellulitis. Breast biopsy done revealed lobular carcinoma which was ER and PR positive and HER-2/neu negative. On PET scan and she was shown to have multiple areas of disease left breast, axilla and supraclavicular areas. She received 6 cycles of chemotherapy and then received Femara because she was not a surgical candidate. PET scan also revealed disease in the left lateral breast and new cervical lymph nodes at level II. The patient was then sent for radiation therapy  and received this over a month. In January 2016 she had a PET CT scan done which  was compatible with marked progression of disease with multifocal soft tissue masses in the chest wall left greater than right. She is currently receiving Xeloda and is noted to have disease in the chest wall which is widely present. Her most recent lab work from 07/02/2014 shows that her CMP is within normal limits with a serum albumin of 4.4. The previous CBC was within normal limits with a WBC count of 4.3 hemoglobin of 14 hematocrit of 40.9 and platelets of 202. 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 We will continue with palliative care and make her comfortable as much as possible as far as her wound care goes. Present time there is no odor but if this does occur we would use carbon- based products to mask the odor. PRENTISS, [name] [name] ([id number]) Notes were also reviewed from her surgeon Dr. Hervey Ard --closure on 05/27/2014 for breast biopsy in view of the fact that she had extensive disease on her chest wall. Biopsies are taken from the nodular metastatic disease overlying the sternum and nodules chosen for biopsy. Punch biopsies were taken and the pathology on this confirmed that this was consistent with carcinoma 08/15/2014 -- she is started on a new course of injectable chemotherapy and this is 2 weeks on and 2 weeks off. She has no new symptoms no bleeding from the wounds nor is there any odor. She is doing fine otherwise. 09/16/2014. She is doing very well and the right side wounds have completely healed. She has finished 2 cycles of chemotherapy and she is 2 weeks on and 2 weeks off. He is to restart her chemotherapy this week. No fresh complaints pain is within control and there is no odor from the wounds. 10/21/2014 -- overall she is doing very well and is on a clinical  trial with her medical oncologist. No issues with the wound on her left side and there is no odor or drainage. 11/25/2014 -- she continues to be under clinical trial protocol with the medical oncologist and has no fresh issues. 02/17/2015 -- she has been taken off the clinical trial protocol and now is on Taxol on a regular basis with her medical oncologists Dr. Susy Manor. 05/20/15; the patient follow see her for a nonhealing area on the left lateral chest. 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 She has been using Aquacel Ag on this area on a palliative basis. No debridement has been done. 06/17/15; the patient follows in this clinic monthly for a nonhealing area on her left lateral chest. She has known metastatic breast cancer which is metastatic to the chest wall. She returns today with a cluster of open areas on the lateral aspect of her chest. The nurses in the clinic described this as a large open area that progressively closed over. I've taken this opportunity to review her oncology status. On her arrival here she asked me to debridement the wounds on her chest stating that this was a request of [name] her medical oncologist. This is a patient who initially developed stage I right breast cancer treated with a modified radical mastectomy in 1998. She really presented with inflammatory breast  cancer in 2014 post chemotherapy biopsy was positive she was not a surgical candidate. Since then she has been treated with multiple regimens. Most particular to her wounds PET scan in January 2016 revealed marked progression of the disease with multifocal masses in the chest wall left greater than right. 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 Left chest wall biopsy on 04/29/15 revealed metastatic  carcinoma consistent with invasive lobular carcinoma. She has been receiving Faslodex. Weekly. She missed her last injection and is attributing this to the breakdown of tissue on her left chest wall. She was seen by [name] in early March however the record is not complete 07/16/15; patient arrives for her monthly visit the. I've reviewed her oncologist notes Dr. Nolon Stalls. Her notes for the increasing nodularity in the chest wall look. Her pain seems adequately controlled with tramadol. The measurements of the necrotic areas seem to be increasing. They are covered with a thick surface slough however I don't believe the debridement is indicated here. She is dressing knees with Aquacel Ag and foam 08/12/15; this is a patient I follow monthly. Her primary oncologist is [name] of the local oncology clinic. The patient lives in [place]. Unfortunately she has expanding necrotic mass in her left anterior chest which is no doubt malignant wounds from her known metastatic breast cancer. She also has a lot of tenderness on the lateral aspect of this wound which might be cellulitis that she appears to have some form of drainage. I have done a culture although this also could be from expanding necrotic tumor 08/19/15; the patient is here for review of culture reports. At the far left area of her necrotic wound was purulent drainage last week. This grew both Citrobacter Koseri and MRSA. The patient does not feel [name] [name], [name] ([id number]) systemically unwell although she is still having pain in the area. No fever. She is now on Ibrance for her metastatic breast cancer 08/26/15; patient is just about completed her antibiotics. There is less drainage but still some tenderness on the lateral aspect of her necrotic chest wall wound. She has not been systemically unwell 09/23/15 there is extension of the necrotic malignant ulcer across her left anterior chest. Surrounding tissue is probably also  involved including extensively medially from the wound and also laterally into the axilla. There is no evidence of infection. The patient has an appointment with General surgery tomorrow apparently arranged by her oncologist. 10/21/15: pt reports cancerous lesion progressing. reports scan today. awaiting results. she denies pain or discomfort associated with her wound. Electronic Signature(s) Signed: 10/21/2015 5:44:16 PM By: Londell Moh FNP Entered By: Londell Moh on 10/21/2015 17:11:24 [name] [name] ([id number]) -------------------------------------------------------------------------------- Physical Exam Details Patient Name: [name] [name], [name] [name] 10/21/2015 3:00 Date of Service: PM Medical Record [id number] Number: Patient Account Number: [id number] September 11, 1950 (65 y.o. Treating RN: Montey Hora Date of Birth/Sex: Female) Other Clinician: Primary Care Physician: PATIENT, NO Treating Londell Moh Referring Physician: Physician/Extender: Suella Grove in Treatment: 2 Constitutional Patient's appearance is neat and clean. Appears in no acute distress. Well nourished and well developed.. Eyes Conjunctivae clear. No discharge.. Ears, Nose, Mouth, and Throat Patient can hear normal speaking tones without difficulty.Marland Kitchen Respiratory Respiratory effort is easy and symmetric bilaterally. Rate is normal at rest and on room air.. Integumentary (Hair, Skin) necrosis of the wound is progressing. nodules are becoming open wounds.. Psychiatric Judgement and insight  intact.. Alert and oriented times 3.. Short and long term memory intact.. No evidence of depression, anxiety, or agitation. Calm, cooperative, and communicative. Appropriate interactions and affect.. Electronic Signature(s) Signed: 10/21/2015 5:44:16 PM By: Londell Moh FNP Entered By: Londell Moh on 10/21/2015 17:14:05 [name] [name]  ([id number]) -------------------------------------------------------------------------------- Physician Orders Details Patient Name: [name] [name], [name] [name] 10/21/2015 3:00 Date of Service: PM Medical Record [id number] Number: Patient Account Number: [id number] 1950/06/11 (65 y.o. Treating RN: Montey Hora Date of Birth/Sex: Female) Other Clinician: Primary Care Physician: PATIENT, NO Treating Londell Moh Referring Physician: Physician/Extender: Suella Grove in Treatment: 63 Verbal / Phone Orders: Yes Clinician: Montey Hora Read Back and Verified: Yes Diagnosis Coding Wound Cleansing Wound #1 Left Chest o Cleanse wound with mild soap and water o May Shower, gently pat wound dry prior to applying new dressing. o May shower with protection. Wound #4 Left,Distal Chest o Cleanse wound with mild soap and water o May Shower, gently pat wound dry prior to applying new dressing. o May shower with protection. Primary Wound Dressing Wound #1 Left Chest o Aquacel Ag Wound #4 Left,Distal Chest o Aquacel Ag Secondary Dressing Wound #1 Left Chest o Boardered Foam Dressing Wound #4 Left,Distal Chest o Boardered Foam Dressing Dressing Change Frequency Wound #1 Left Chest o Change dressing every other day. - or more if needed Wound #4 Left,Distal Chest o Change dressing every other day. - or more if needed Follow-up Appointments Wound #1 Left Chest [name] [name], HASELTON. ([id number]) o Return Appointment in 1 week. Wound #4 Left,Distal Chest o Return Appointment in 1 week. Electronic Signature(s) Signed: 10/21/2015 4:26:08 PM By: Montey Hora Signed: 10/21/2015 5:44:16 PM By: Londell Moh FNP Entered By: Montey Hora on 10/21/2015 15:43:13 [name] [name] ([id number]) -------------------------------------------------------------------------------- Problem List Details Patient Name: [name] [name], [name] [name] 10/21/2015 3:00 Date of Service: PM Medical  Record [id number] Number: Patient Account Number: [id number] [birth year] (65 y.o. Treating RN: Montey Hora Date of Birth/Sex: Female) Other Clinician: Primary Care Physician: PATIENT, NO Treating Londell Moh Referring Physician: Physician/Extender: Suella Grove in Treatment: 64 Active Problems ICD-10 Encounter Code Description Active Date Diagnosis C50.812 Malignant neoplasm of overlapping sites of left female 07/11/2014 Yes breast S21.001A Unspecified open wound of right breast, initial encounter 07/11/2014 Yes C50.412 Malignant neoplasm of upper-outer quadrant of left female 07/11/2014 Yes breast S21.002A Unspecified open wound of left breast, initial encounter 07/11/2014 Yes Inactive Problems Resolved Problems Electronic Signature(s) Signed: 10/21/2015 5:44:16 PM By: Londell Moh FNP Entered By: Londell Moh on 10/21/2015 17:07:33 [name] [name] ([id number]) -------------------------------------------------------------------------------- Progress Note Details Patient Name: [name] [name]. 10/21/2015 3:00 Date of Service: PM Medical Record [id number] Number: Patient Account Number: [id number] [birth year] (65 y.o. 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 07/18/2014 -- since last week the patient has had no complaints and is on her last cycle of chemotherapy. I have reviewed 155 pages of her reports from a medical oncologist and the summary is made in the history of present illness. History of Present Illness (HPI) this 65 year old  patient has come by herself today as a self-referral and has no documentation with her. She has had open wounds to her left  chest and the right chest wall for about 2 months. She is not a diabetic and has no significant medical history except breast cancer. Her right breast cancer was diagnosed 18 years ago and she had a mastectomy and chemotherapy. 3 years ago she was then diagnosed with a breast cancer of the left side which was advanced and could not be operated and was given chemotherapy for a year and then followed by radiation. Her last radiation was over a year ago. She has not had any biopsy done recently from the ulcerated area but she says there have been other biopsies done from the chest wall and these reports are not available at the present time. She was told to keep the wounds open and let them dry out but she is finding it's more and more difficult to stop soiling her clothes if she leaves wounds open. She is on chemotherapy and we will try and obtain these notes from her oncologist. She is not in pain and does not have any significant problems except the discomfort from an open wound on the chest wall. 07/18/2014 This is a summary of [name] [name] date of birth September 10, 1950. These are obtained by reviewing 151 pages of medical records which were obtained from the [facility] 65 year old patient who had a history of stage I right breast cancer status post modified radical mastectomy on 06/12/1996. 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 She received 4 cycles of AC at [facility] In January 2014 she presented with left breast swelling and apparently cellulitis. Breast biopsy done revealed lobular carcinoma which was ER and PR positive and HER-2/neu negative. On PET scan and she was shown to have multiple areas of disease left breast, axilla and supraclavicular areas. She received 6 cycles of chemotherapy and then  received Femara because she was not a surgical candidate. PET scan also revealed disease in the left lateral breast and new cervical lymph nodes at level II. The patient was then sent for radiation therapy and received this over a month. In January 2016 she had a PET CT scan done which was compatible with marked progression of disease with multifocal soft tissue masses in the chest wall left greater than right. ERIYAH, FERNANDO. ([id number]) She is currently receiving Xeloda and is noted to have disease in the chest wall which is widely present. Her most recent lab work from 07/02/2014 shows that her CMP is within normal limits with a serum albumin of 4.4. The previous CBC was within normal limits with a WBC count of 4.3 hemoglobin of 14 hematocrit of 40.9 and platelets of 202. 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 We will continue with palliative care and make her comfortable as much as possible as far as her wound care goes. Present time there is no odor but if this does occur we would use carbon- based products to mask the odor. Notes were also reviewed from her surgeon Dr. Hervey Ard --closure on 05/27/2014 for breast biopsy in view of the fact that she had extensive disease on her chest wall. Biopsies are taken from the nodular metastatic disease overlying the sternum and nodules chosen for biopsy. Punch biopsies were taken and the pathology on this confirmed that this was consistent with carcinoma  08/15/2014 -- she is started on a new course of injectable chemotherapy and this is 2 weeks on and 2 weeks off. She has no new symptoms no bleeding from the wounds nor is there any odor. She is doing fine otherwise. 09/16/2014. She is doing very well and the right side wounds have  completely healed. She has finished 2 cycles of chemotherapy and she is 2 weeks on and 2 weeks off. He is to restart her chemotherapy this week. No fresh complaints pain is within control and there is no odor from the wounds. 10/21/2014 -- overall she is doing very well and is on a clinical trial with her medical oncologist. No issues with the wound on her left side and there is no odor or drainage. 11/25/2014 -- she continues to be under clinical trial protocol with the medical oncologist and has no fresh issues. 02/17/2015 -- she has been taken off the clinical trial protocol and now is on Taxol on a regular basis with her medical oncologists Dr. Susy Manor. 05/20/15; the patient follow see her for a nonhealing area on the left lateral chest. 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 She has been using Aquacel Ag on this area on a palliative basis. No debridement has been done. 06/17/15; the patient follows in this clinic monthly for a nonhealing area on her left lateral chest. She has known metastatic breast cancer which is metastatic to the chest wall. She returns today with a cluster of open areas on the lateral aspect of her chest. The nurses in the clinic described this as a large open area that progressively closed over. I've taken this opportunity to review her oncology status. On her arrival here she asked me to debridement the wounds on her chest stating that this was a request of [name] her medical oncologist. This is a patient who initially developed stage I right breast cancer treated with a modified radical mastectomy in 1998. She really presented with inflammatory breast cancer in 2014 post chemotherapy biopsy was positive she was not a surgical candidate. Since then she has been treated with multiple regimens. Most particular to her wounds PET scan in January 2016 revealed marked progression of the disease with  multifocal masses in the chest wall left greater than right. 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 She has been receiving Faslodex. Weekly. She missed her last injection and is attributing this to the breakdown of tissue on her left chest wall. She was seen by [name] in early March however the record is not complete 07/16/15; patient arrives for her monthly visit the. I've reviewed her oncologist notes Dr. Nolon Stalls. Her notes for the increasing nodularity in the chest wall look. Her pain seems adequately controlled with tramadol. The measurements of the necrotic areas seem to be increasing. They are covered with a thick Square, Tequisha W. ([id number]) surface slough however I don't believe the debridement is indicated here. She is dressing knees with Aquacel Ag and foam 08/12/15; this is a patient I follow monthly. Her primary oncologist is [name] of the local oncology clinic. The patient lives in [place]. Unfortunately she has expanding necrotic mass in her left anterior chest which is no doubt malignant wounds from her known  metastatic breast cancer. She also has a lot of tenderness on the lateral aspect of this wound which might be cellulitis that she appears to have some form of drainage. I have done a culture although this also could be from expanding necrotic tumor 08/19/15; the patient is here for review of culture reports. At the far left area of her necrotic wound was purulent drainage last week. This grew both Citrobacter Koseri and MRSA. The patient does not feel systemically unwell although she is still having pain in the area. No fever. She is now on Ibrance for her metastatic breast cancer 08/26/15; patient is just about  completed her antibiotics. There is less drainage but still some tenderness on the lateral aspect of her necrotic chest wall wound. She has not been systemically unwell 09/23/15 there is extension of the necrotic malignant ulcer across her left anterior chest. Surrounding tissue is probably also involved including extensively medially from the wound and also laterally into the axilla. There is no evidence of infection. The patient has an appointment with General surgery tomorrow apparently arranged by her oncologist. 10/21/15: pt reports cancerous lesion progressing. reports scan today. awaiting results. she denies pain or discomfort associated with her wound. denies fever, chills, body aches or malaise. Objective Constitutional Patient's appearance is neat and clean. Appears in no acute distress. Well nourished and well developed.. Vitals Time Taken: 3:50 PM, Height: 68 in, Weight: 180.5 lbs, BMI: 27.4, Temperature: 98.6 F, Pulse: 91 bpm, Respiratory Rate: 18 breaths/min, Blood Pressure: 109/65 mmHg. Eyes Conjunctivae clear. No discharge.. Ears, Nose, Mouth, and Throat Patient can hear normal speaking tones without difficulty.Marland Kitchen Respiratory Respiratory effort is easy and symmetric bilaterally. Rate is normal at rest and on room air.Marland Kitchen Psychiatric Judgement and insight intact.. Alert and oriented times 3.. Short and long term memory intact.. No evidence of depression, anxiety, or agitation. Calm, cooperative, and communicative. Appropriate interactions and affect.Archie Balboa, Elouise Munroe ([id number]) Integumentary (Hair, Skin) necrosis of the wound is progressing. nodules are becoming open wounds.. Wound #1 status is Open. Original cause of wound was Other Lesion. The wound is located on the Left Chest. The wound measures 9cm length x 11.5cm width x 0.4cm depth; 81.289cm^2 area and 32.515cm^3 volume. The wound is limited to skin breakdown. There is no tunneling or undermining noted. There is  a large amount of serosanguineous drainage noted. The wound margin is fibrotic, thickened scar. There is small (1-33%) pink, pale granulation within the wound bed. There is a large (67-100%) amount of necrotic tissue within the wound bed including Eschar and Adherent Slough. The periwound skin appearance had no abnormalities noted for moisture. The periwound skin appearance had no abnormalities noted for color. The periwound skin appearance exhibited: Induration, Scarring. The periwound skin appearance did not exhibit: Callus, Crepitus, Excoriation, Fluctuance, Friable, Localized Edema, Rash. Periwound temperature was noted as No Abnormality. The periwound has tenderness on palpation. Wound #4 status is Open. Original cause of wound was Radiation Burn. The wound is located on the Left,Distal Chest. The wound measures 1.2cm length x 1cm width x 0.1cm depth; 0.942cm^2 area and 0.094cm^3 volume. The wound is limited to skin breakdown. There is no tunneling or undermining noted. There is a medium amount of serosanguineous drainage noted. The wound margin is distinct with the outline attached to the wound base. There is medium (34-66%) granulation within the wound bed. There is a small (1-33%) amount of necrotic tissue within the wound bed including Adherent Slough. The periwound skin appearance exhibited: Moist. The periwound  skin appearance did not exhibit: Callus, Crepitus, Excoriation, Fluctuance, Friable, Induration, Localized Edema, Rash, Scarring, Dry/Scaly, Maceration, Atrophie Blanche, Cyanosis, Ecchymosis, Hemosiderin Staining, Mottled, Pallor, Rubor, Erythema. Periwound temperature was noted as No Abnormality. The periwound has tenderness on palpation. 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 cancerous lesion. TIFFINEY, SPARROW ([id number]) Plan Wound Cleansing: Wound #1 Left Chest: Cleanse wound with mild soap and water May Shower, gently pat wound dry prior to applying new dressing. May shower with protection. Wound #4 Left,Distal Chest: Cleanse wound with mild soap and water May Shower, gently pat wound dry prior to applying new dressing. May shower with protection. Primary Wound Dressing: Wound #1 Left Chest: Aquacel Ag Wound #4 Left,Distal Chest: Aquacel Ag Secondary Dressing: Wound #1 Left Chest: Boardered Foam Dressing Wound #4 Left,Distal Chest: Boardered Foam Dressing Dressing Change Frequency: Wound #1 Left Chest: Change dressing every other day. - or more if needed Wound #4 Left,Distal Chest: Change dressing every other day. - or more if needed Follow-up Appointments: Wound #1 Left Chest: Return Appointment in 1 week. Wound #4 Left,Distal Chest: Return Appointment in 1 week. Follow-Up Appointments: A follow-up appointment should be scheduled. A Patient Clinical Summary of Care was provided to [facility] Electronic Signature(s) Signed: 10/21/2015 5:44:16 PM By: Londell Moh FNP Entered By: Londell Moh on 10/21/2015 17:14:45 [name] [name] ([id number]) Archie Balboa, Elouise Munroe ([id number]) -------------------------------------------------------------------------------- SuperBill Details Patient Name: [name] [name] Date of Service: 10/21/2015 Medical Record Number: [id number] Patient Account Number: [id number] Date of Birth/Sex: 02-19-1951 (65 y.o. 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 S21.002A Unspecified open wound of left breast, initial encounter Facility Procedures CPT4 Code: 69678938 Description: 99213 - WOUND CARE VISIT-LEV 3 EST PT Modifier: Quantity: 1 Physician Procedures CPT4 Code Description: [id number] 99213 - WC PHYS LEVEL 3 - EST PT ICD-10 Description Diagnosis C50.812 Malignant neoplasm of overlapping sites of left S21.001A Unspecified open wound of right breast, initial C50.412 Malignant neoplasm of upper-outer  quadrant of le Modifier: female breast encounter ft female brea Quantity: 1 st Electronic Signature(s) Signed: 10/21/2015 5:44:16 PM By: Londell Moh FNP Entered By: Londell Moh on 10/21/2015 17:15:17

## 2015-10-22 NOTE — Progress Notes (Signed)
[name] [name], [name] [name] ([id number]) Visit Report for 10/21/2015 Arrival Information Details Patient Name: [name] [name], [name] [name] Date of Service: 10/21/2015 3:00 PM Medical Record Number: [id number] Patient Account Number: [id number] Date of Birth/Sex: 02-21-1951 (64 y.o. Female) Treating RN: Montey Hora Primary Care Physician: PATIENT, NO Other Clinician: Referring Physician: Treating Physician/Extender: Loistine Chance in Treatment: 66 Visit Information History Since Last Visit Added or deleted any medications: No Patient Arrived: Ambulatory Any new allergies or adverse reactions: No Arrival Time: 15:19 Had a fall or experienced change in No Accompanied By: self activities of daily living that may affect Transfer Assistance: None risk of falls: Patient Identification Verified: Yes Signs or symptoms of abuse/neglect since last No Secondary Verification Process Yes visito Completed: Hospitalized since last visit: No Patient Requires Transmission-Based No Pain Present Now: Yes Precautions: Patient Has Alerts: No Electronic Signature(s) Signed: 10/21/2015 4:26:08 PM By: Montey Hora Entered By: Montey Hora on 10/21/2015 15:19:49 [name] [name] ([id number]) -------------------------------------------------------------------------------- Clinic Level of Care Assessment Details Patient Name: [name] [name] Date of Service: 10/21/2015 3:00 PM Medical Record Number: [id number] Patient Account Number: [id number] Date of Birth/Sex: 1950/10/22 (64 y.o. Female) Treating RN: Montey Hora Primary Care Physician: PATIENT, NO Other Clinician: Referring Physician: Treating Physician/Extender: Loistine Chance in Treatment: 66 Clinic Level of Care Assessment Items TOOL 4 Quantity Score []  - Use when only an EandM is performed on FOLLOW-UP visit 0 ASSESSMENTS - Nursing Assessment / Reassessment X - Reassessment of Co-morbidities (includes updates in patient status) 1  10 X - Reassessment of Adherence to Treatment Plan 1 5 ASSESSMENTS - Wound and Skin Assessment / Reassessment []  - Simple Wound Assessment / Reassessment - one wound 0 X - Complex Wound Assessment / Reassessment - multiple wounds 2 5 []  - Dermatologic / Skin Assessment (not related to wound area) 0 ASSESSMENTS - Focused Assessment []  - Circumferential Edema Measurements - multi extremities 0 []  - Nutritional Assessment / Counseling / Intervention 0 []  - Lower Extremity Assessment (monofilament, tuning fork, pulses) 0 []  - Peripheral Arterial Disease Assessment (using hand held doppler) 0 ASSESSMENTS - Ostomy and/or Continence Assessment and Care []  - Incontinence Assessment and Management 0 []  - Ostomy Care Assessment and Management (repouching, etc.) 0 PROCESS - Coordination of Care X - Simple Patient / Family Education for ongoing care 1 15 []  - Complex (extensive) Patient / Family Education for ongoing care 0 []  - Staff obtains Programmer, systems, Records, Test Results / Process Orders 0 []  - Staff telephones HHA, Nursing Homes / Clarify orders / etc 0 []  - Routine Transfer to another Facility (non-emergent condition) 0 [place], [name] [name] ([id number]) []  - Routine Hospital Admission (non-emergent condition) 0 []  - New Admissions / Biomedical engineer / Ordering NPWT, Apligraf, etc. 0 []  - Emergency Hospital Admission (emergent condition) 0 X - Simple Discharge Coordination 1 10 []  - Complex (extensive) Discharge Coordination 0 PROCESS - Special Needs []  - Pediatric / Minor Patient Management 0 []  - Isolation Patient Management 0 []  - Hearing / Language / Visual special needs 0 []  - Assessment of Community assistance (transportation, D/C planning, etc.) 0 []  - Additional assistance / Altered mentation 0 []  - Support Surface(s) Assessment (bed, cushion, seat, etc.) 0 INTERVENTIONS - Wound Cleansing / Measurement []  - Simple Wound Cleansing - one wound 0 X - Complex Wound Cleansing -  multiple wounds 2 5 X - Wound Imaging (photographs - any number of wounds) 1 5 []  - Wound Tracing (instead of photographs) 0 []  - Simple Wound Measurement -  one wound 0 X - Complex Wound Measurement - multiple wounds 2 5 INTERVENTIONS - Wound Dressings X - Small Wound Dressing one or multiple wounds 2 10 []  - Medium Wound Dressing one or multiple wounds 0 []  - Large Wound Dressing one or multiple wounds 0 []  - Application of Medications - topical 0 []  - Application of Medications - injection 0 INTERVENTIONS - Miscellaneous []  - External ear exam 0 [name], [name] []  - Specimen Collection (cultures, biopsies, blood, body fluids, etc.) 0 []  - Specimen(s) / Culture(s) sent or taken to Lab for analysis 0 []  - Patient Transfer (multiple staff / Harrel Lemon Lift / Similar devices) 0 []  - Simple Staple / Suture removal (25 or less) 0 []  - Complex Staple / Suture removal (26 or more) 0 []  - Hypo / Hyperglycemic Management (close monitor of Blood Glucose) 0 []  - Ankle / Brachial Index (ABI) - do not check if billed separately 0 X - Vital Signs 1 5 Has the patient been seen at the hospital within the last three years: Yes Total Score: 100 Level Of Care: New/Established - Level 3 Electronic Signature(s) Signed: 10/21/2015 4:26:08 PM By: Montey Hora Entered By: Montey Hora on 10/21/2015 15:52:53 [name] [name] ([id number]) -------------------------------------------------------------------------------- Encounter Discharge Information Details Patient Name: [name] [name] Date of Service: 10/21/2015 3:00 PM Medical Record Number: [id number] Patient Account Number: [id number] Date of Birth/Sex: [birth year] (64 y.o. Female) Treating RN: Montey Hora Primary Care Physician: PATIENT, NO Other Clinician: Referring Physician: Treating Physician/Extender: Loistine Chance in Treatment: 66 Encounter Discharge Information Items Discharge Pain Level: 0 Discharge Condition:  Stable Ambulatory Status: Ambulatory Discharge Destination: Home Transportation: Private Auto Accompanied By: self Schedule Follow-up Appointment: Yes Medication Reconciliation completed and provided to Patient/Care No [name] [name]: Provided on Clinical Summary of Care: 10/21/2015 Form Type Recipient Paper Patient EG Electronic Signature(s) Signed: 10/21/2015 3:54:07 PM By: Ruthine Dose Entered By: Ruthine Dose on 10/21/2015 15:54:06 [name] [name] ([id number]) -------------------------------------------------------------------------------- Multi Wound Chart Details Patient Name: [name] [name] Date of Service: 10/21/2015 3:00 PM Medical Record Number: [id number] Patient Account Number: [id number] Date of Birth/Sex: [birth year] (64 y.o. Female) Treating RN: Montey Hora Primary Care Physician: PATIENT, NO Other Clinician: Referring Physician: Treating Physician/Extender: Loistine Chance in Treatment: 66 Photos: [1:No Photos] [4:No Photos] [N/A:N/A] Wound Location: [1:Left Chest] [4:Left Chest - Distal] [N/A:N/A] Wounding Event: [1:Other Lesion] [4:Radiation Burn] [N/A:N/A] Primary Etiology: [1:Malignant Wound] [4:Malignant Wound] [N/A:N/A] Comorbid History: [1:Received Chemotherapy, Received Radiation] [4:Received Chemotherapy, Received Radiation] [N/A:N/A] Date Acquired: [1:05/10/2014] [4:07/13/2015] [N/A:N/A] Weeks of Treatment: [1:66] [4:14] [N/A:N/A] Wound Status: [1:Open] [4:Open] [N/A:N/A] Measurements L x W x D 9x11.5x0.4 [4:1.2x1x0.1] [N/A:N/A] (cm) Area (cm) : [1:81.289] [4:0.942] [N/A:N/A] Volume (cm) : [1:32.515] [4:0.094] [N/A:N/A] % Reduction in Area: [1:-245.00%] [4:7.70%] [N/A:N/A] % Reduction in Volume: -590.00% [4:53.90%] [N/A:N/A] Classification: [1:Full Thickness Without Exposed Support Structures] [4:Full Thickness Without Exposed Support Structures] [N/A:N/A] Exudate Amount: [1:Large] [4:Medium] [N/A:N/A] Exudate Type: [1:Serosanguineous]  [4:Serosanguineous] [N/A:N/A] Exudate Color: [1:red, brown] [4:red, brown] [N/A:N/A] Wound Margin: [1:Fibrotic scar, thickened scar] [4:Distinct, outline attached] [N/A:N/A] Granulation Amount: [1:Small (1-33%)] [4:Medium (34-66%)] [N/A:N/A] Granulation Quality: [1:Pink, Pale] [4:N/A] [N/A:N/A] Necrotic Amount: [1:Large (67-100%)] [4:Small (1-33%)] [N/A:N/A] Necrotic Tissue: [1:Eschar, Adherent Slough] [4:Adherent Slough] [N/A:N/A] Exposed Structures: [1:Fascia: No Fat: No Tendon: No Muscle: No Joint: No Bone: No Limited to Skin Breakdown] [4:Fascia: No Fat: No Tendon: No Muscle: No Joint: No Bone: No Limited to Skin Breakdown] [N/A:N/A] Epithelialization: [1:None] [4:None] [N/A:N/A] Periwound Skin Texture: Induration: Yes [1:Scarring: Yes] [4:Edema: No Excoriation: No] [N/A:N/A]  Edema: No Induration: No Excoriation: No Callus: No Callus: No Crepitus: No Crepitus: No Fluctuance: No Fluctuance: No Friable: No Friable: No Rash: No Rash: No Scarring: No Periwound Skin No Abnormalities Noted Moist: Yes N/A Moisture: Maceration: No Dry/Scaly: No Periwound Skin Color: No Abnormalities Noted Atrophie Blanche: No N/A Cyanosis: No Ecchymosis: No Erythema: No Hemosiderin Staining: No Mottled: No Pallor: No Rubor: No Temperature: No Abnormality No Abnormality N/A Tenderness on Yes Yes N/A Palpation: Wound Preparation: Ulcer Cleansing: Ulcer Cleansing: N/A Rinsed/Irrigated with Rinsed/Irrigated with Saline Saline Topical Anesthetic Topical Anesthetic Applied: None Applied: None Treatment Notes Electronic Signature(s) Signed: 10/21/2015 3:33:05 PM By: Montey Hora Entered By: Montey Hora on 10/21/2015 15:33:05 [name] [name] ([id number]) -------------------------------------------------------------------------------- [place] Details Patient Name: [name] [name] Date of Service: 10/21/2015 3:00 PM Medical Record Number: [id number] Patient  Account Number: [id number] Date of Birth/Sex: [birth year] (64 y.o. Female) Treating RN: Montey Hora Primary Care Physician: PATIENT, NO Other Clinician: Referring Physician: Treating Physician/Extender: Loistine Chance in Treatment: 66 Active Inactive Abuse / Safety / Falls / Self Care Management Nursing Diagnoses: Potential for falls Goals: Patient will remain injury free Date Initiated: 07/11/2014 Goal Status: Active Interventions: Assess fall risk on admission and as needed Notes: Malignancy/Atypical Etiology Nursing Diagnoses: Knowledge deficit related to disease process and management of atypical ulcer etiology Goals: Patient/caregiver will verbalize understanding of disease process and disease management of atypical ulcer etiology Date Initiated: 07/11/2014 Goal Status: Active Interventions: Provide education on atypical ulcer etiologies Notes: Nutrition Nursing Diagnoses: Potential for alteratiion in Nutrition/Potential for imbalanced nutrition Goals: [name] [name], [name] [name] ([id number]) Patient/caregiver agrees to and verbalizes understanding of need to use nutritional supplements and/or vitamins as prescribed Date Initiated: 07/11/2014 Goal Status: Active Interventions: Assess patient nutrition upon admission and as needed per policy Notes: Orientation to the Wound Care Program Nursing Diagnoses: Knowledge deficit related to the wound healing center program Goals: Patient/caregiver will verbalize understanding of the [facility] Program Date Initiated: 07/11/2014 Goal Status: Active Interventions: Provide education on orientation to the wound center Notes: Wound/Skin Impairment Nursing Diagnoses: Impaired tissue integrity Goals: Patient will demonstrate a reduced rate of smoking or cessation of smoking Date Initiated: 07/11/2014 Goal Status: Active Patient will have a decrease in wound volume by X% from date: (specify in notes) Date Initiated:  07/11/2014 Goal Status: Active Patient/caregiver will verbalize understanding of skin care regimen Date Initiated: 07/11/2014 Goal Status: Active Ulcer/skin breakdown will have a volume reduction of 30% by week 4 Date Initiated: 07/11/2014 Goal Status: Active Ulcer/skin breakdown will have a volume reduction of 50% by week 8 Date Initiated: 07/11/2014 Goal Status: Active Ulcer/skin breakdown will have a volume reduction of 80% by week 12 [name] [name], [name] [name] ([id number]) Date Initiated: 07/11/2014 Goal Status: Active Ulcer/skin breakdown will heal within 14 weeks Date Initiated: 07/11/2014 Goal Status: Active Interventions: Assess patient/caregiver ability to obtain necessary supplies Assess patient/caregiver ability to perform ulcer/skin care regimen upon admission and as needed Assess ulceration(s) every visit Provide education on smoking Provide education on ulcer and skin care Screen for HBO Notes: Electronic Signature(s) Signed: 10/21/2015 3:32:57 PM By: Montey Hora Entered By: Montey Hora on 10/21/2015 15:32:56 [name] [name] ([id number]) -------------------------------------------------------------------------------- Pain Assessment Details Patient Name: [name] [name] Date of Service: 10/21/2015 3:00 PM Medical Record Number: [id number] Patient Account Number: [id number] Date of Birth/Sex: 01/05/1951 (64 y.o. Female) Treating RN: Montey Hora Primary Care Physician: PATIENT, NO Other Clinician: Referring Physician: Treating Physician/Extender: Loistine Chance in Treatment: 66 Active Problems Location of Pain  Severity and Description of Pain Patient Has Paino Yes Site Locations Pain Location: Pain in Ulcers With Dressing Change: Yes Duration of the Pain. Constant / Intermittento Constant Pain Management and Medication Current Pain Management: Electronic Signature(s) Signed: 10/21/2015 4:26:08 PM By: Montey Hora Entered By: Montey Hora on  10/21/2015 15:20:09 [name] [name] ([id number]) -------------------------------------------------------------------------------- Patient/Caregiver Education Details Patient Name: [name] [name] Date of Service: 10/21/2015 3:00 PM Medical Record Number: [id number] Patient Account Number: [id number] Date of Birth/Gender: 1950-07-27 (64 y.o. Female) Treating RN: Montey Hora Primary Care Physician: PATIENT, NO Other Clinician: Referring Physician: Treating Physician/Extender: Loistine Chance in Treatment: 72 Education Assessment Education Provided To: Patient Education Topics Provided Wound/Skin Impairment: Handouts: Other: continue wound care as ordered Methods: Demonstration, Explain/Verbal Responses: State content correctly Electronic Signature(s) Signed: 10/21/2015 4:26:08 PM By: Montey Hora Entered By: Montey Hora on 10/21/2015 15:53:48 [name] [name] ([id number]) -------------------------------------------------------------------------------- Wound Assessment Details Patient Name: [name] [name] Date of Service: 10/21/2015 3:00 PM Medical Record Number: [id number] Patient Account Number: [id number] Date of Birth/Sex: 1951/04/01 (64 y.o. Female) Treating RN: Montey Hora Primary Care Physician: PATIENT, NO Other Clinician: Referring Physician: Treating Physician/Extender: Loistine Chance in Treatment: 66 Wound Status Wound Number: 1 Primary Malignant Wound Etiology: Wound Location: Left Chest Wound Status: Open Wounding Event: Other Lesion Comorbid Received Chemotherapy, Received Date Acquired: 05/10/2014 History: Radiation Weeks Of Treatment: 66 Clustered Wound: No Photos Photo Uploaded By: Montey Hora on 10/21/2015 16:08:37 Wound Measurements Length: (cm) 9 Width: (cm) 11.5 Depth: (cm) 0.4 Area: (cm) 81.289 Volume: (cm) 32.515 % Reduction in Area: -245% % Reduction in Volume: -590% Epithelialization: None Tunneling:  No Undermining: No Wound Description Full Thickness Without Exposed Classification: Support Structures Wound Margin: Fibrotic scar, thickened scar Exudate Large Amount: Exudate Type: Serosanguineous Exudate Color: red, brown Foul Odor After Cleansing: No Wound Bed Granulation Amount: Small (1-33%) Exposed Structure Granulation Quality: Pink, Pale Fascia Exposed: No Necrotic Amount: Large (67-100%) Fat Layer Exposed: No [name] [name], [name] ([id number]) Necrotic Quality: Eschar, Adherent Slough Tendon Exposed: No Muscle Exposed: No Joint Exposed: No Bone Exposed: No Limited to Skin Breakdown Periwound Skin Texture Texture Color No Abnormalities Noted: No No Abnormalities Noted: Yes Callus: No Temperature / Pain Crepitus: No Temperature: No Abnormality Excoriation: No Tenderness on Palpation: Yes Fluctuance: No Friable: No Induration: Yes Localized Edema: No Rash: No Scarring: Yes Moisture No Abnormalities Noted: Yes Wound Preparation Ulcer Cleansing: Rinsed/Irrigated with Saline Topical Anesthetic Applied: None Treatment Notes Wound #1 (Left Chest) 1. Cleansed with: Clean wound with Normal Saline 4. Dressing Applied: Aquacel Ag 5. Secondary Dressing Applied Bordered Foam Dressing Electronic Signature(s) Signed: 10/21/2015 4:26:08 PM By: Montey Hora Entered By: Montey Hora on 10/21/2015 15:30:32 [name] [name] ([id number]) -------------------------------------------------------------------------------- Wound Assessment Details Patient Name: [name] [name] Date of Service: 10/21/2015 3:00 PM Medical Record Number: [id number] Patient Account Number: [id number] Date of Birth/Sex: 05-26-1950 (64 y.o. Female) Treating RN: Montey Hora Primary Care Physician: PATIENT, NO Other Clinician: Referring Physician: Treating Physician/Extender: Loistine Chance in Treatment: 66 Wound Status Wound Number: 4 Primary Malignant Wound Etiology: Wound  Location: Left Chest - Distal Wound Status: Open Wounding Event: Radiation Burn Comorbid Received Chemotherapy, Received Date Acquired: 07/13/2015 History: Radiation Weeks Of Treatment: 14 Clustered Wound: No Photos Photo Uploaded By: Montey Hora on 10/21/2015 16:08:38 Wound Measurements Length: (cm) 1.2 Width: (cm) 1 Depth: (cm) 0.1 Area: (cm) 0.942 Volume: (cm) 0.094 % Reduction in Area: 7.7% % Reduction in Volume: 53.9% Epithelialization: None Tunneling: No Undermining: No Wound Description Full Thickness  Without Exposed Classification: Support Structures Wound Margin: Distinct, outline attached Exudate Medium Amount: Exudate Type: Serosanguineous Exudate Color: red, brown Foul Odor After Cleansing: No Wound Bed Granulation Amount: Medium (34-66%) Exposed Structure Necrotic Amount: Small (1-33%) Fascia Exposed: No Necrotic Quality: Adherent Slough Fat Layer Exposed: No [name] [name], [name] Tendon Exposed: No Muscle Exposed: No Joint Exposed: No Bone Exposed: No Limited to Skin Breakdown Periwound Skin Texture Texture Color No Abnormalities Noted: No No Abnormalities Noted: No Callus: No Atrophie Blanche: No Crepitus: No Cyanosis: No Excoriation: No Ecchymosis: No Fluctuance: No Erythema: No Friable: No Hemosiderin Staining: No Induration: No Mottled: No Localized Edema: No Pallor: No Rash: No Rubor: No Scarring: No Temperature / Pain Moisture Temperature: No Abnormality No Abnormalities Noted: No Tenderness on Palpation: Yes Dry / Scaly: No Maceration: No Moist: Yes Wound Preparation Ulcer Cleansing: Rinsed/Irrigated with Saline Topical Anesthetic Applied: None Treatment Notes Wound #4 (Left, Distal Chest) 1. Cleansed with: Clean wound with Normal Saline 4. Dressing Applied: Aquacel Ag 5. Secondary Dressing Applied Bordered Foam Dressing Electronic Signature(s) Signed: 10/21/2015 4:26:08 PM By: Montey Hora Entered  By: Montey Hora on 10/21/2015 15:30:47 [name] [name] ([id number]) -------------------------------------------------------------------------------- Vitals Details Patient Name: [name] [name] Date of Service: 10/21/2015 3:00 PM Medical Record Number: [id number] Patient Account Number: [id number] Date of Birth/Sex: 01/08/1951 (64 y.o. Female) Treating RN: Montey Hora Primary Care Physician: PATIENT, NO Other Clinician: Referring Physician: Treating Physician/Extender: Loistine Chance in Treatment: 66 Vital Signs Time Taken: 15:50 Temperature (F): 98.6 Height (in): 68 Pulse (bpm): 91 Weight (lbs): 180.5 Respiratory Rate (breaths/min): 18 Body Mass Index (BMI): 27.4 Blood Pressure (mmHg): 109/65 Reference Range: 80 - 120 mg / dl Electronic Signature(s) Signed: 10/21/2015 4:26:08 PM By: Montey Hora Entered By: Montey Hora on 10/21/2015 15:52:07

## 2015-10-23 ENCOUNTER — Telehealth: Payer: Self-pay

## 2015-10-23 ENCOUNTER — Other Ambulatory Visit: Payer: Self-pay | Admitting: *Deleted

## 2015-10-23 DIAGNOSIS — C50912 Malignant neoplasm of unspecified site of left female breast: Secondary | ICD-10-CM

## 2015-10-23 DIAGNOSIS — C792 Secondary malignant neoplasm of skin: Secondary | ICD-10-CM

## 2015-10-23 NOTE — Telephone Encounter (Signed)
Called [name] [name] and let her know that I have informed corcoran that Dr. Lucile Crater office has not contacted [name] [name] and she will text him because she has his personal number and will let me knoe the outcome and I can call [name] [name] back when i hear something.

## 2015-10-23 NOTE — Telephone Encounter (Signed)
Patient stated that she did not receive a call from Dr. Cherlynn June office and that [name] wanted to know this information.

## 2015-10-28 ENCOUNTER — Telehealth: Payer: Self-pay | Admitting: *Deleted

## 2015-10-28 ENCOUNTER — Telehealth: Payer: Self-pay

## 2015-10-28 MED ORDER — TRAMADOL HCL 50 MG PO TABS: 50.0000 mg | ORAL_TABLET | Freq: Three times a day (TID) | ORAL | Status: DC | PRN

## 2015-10-28 NOTE — Telephone Encounter (Signed)
Spoke with Meisha at [facility] medical supplies advised that patient can not afford to pay for 15 day supply of supplies if medicare only paid for 15 days. Meisha explained with patient having [employer] medicare that her supplies will be covered. patient has [facility] medicare and they will pay for patient dressing supplies. She will talk with patient and let patient know about her supplies

## 2015-10-28 NOTE — Telephone Encounter (Signed)
Asking for status of new med and needs refill on Tramadol.  Informed that drug has been approved and they should be getting in touch with her regarding copay and shipment

## 2015-10-29 ENCOUNTER — Ambulatory Visit: Admission: RE | Admit: 2015-10-29 | Payer: Medicare Other | Source: Ambulatory Visit | Admitting: Radiation Oncology

## 2015-11-04 ENCOUNTER — Ambulatory Visit
Admission: RE | Admit: 2015-11-04 | Discharge: 2015-11-04 | Disposition: A | Discharge status: Home / Self Care | Payer: Medicare Other | Source: Ambulatory Visit | Attending: Radiation Oncology | Admitting: Radiation Oncology

## 2015-11-04 ENCOUNTER — Inpatient Hospital Stay: Payer: Medicare Other

## 2015-11-04 ENCOUNTER — Inpatient Hospital Stay (HOSPITAL_BASED_OUTPATIENT_CLINIC_OR_DEPARTMENT_OTHER): Payer: Medicare Other | Admitting: Hematology and Oncology

## 2015-11-04 VITALS — BP 115/76 | HR 107 | Temp 97.7°F | Resp 18 | Wt 153.0 lb

## 2015-11-04 DIAGNOSIS — C50912 Malignant neoplasm of unspecified site of left female breast: Secondary | ICD-10-CM

## 2015-11-04 DIAGNOSIS — Z170 Estrogen receptor positive status [ER+]: Secondary | ICD-10-CM

## 2015-11-04 DIAGNOSIS — C50112 Malignant neoplasm of central portion of left female breast: Secondary | ICD-10-CM

## 2015-11-04 DIAGNOSIS — G629 Polyneuropathy, unspecified: Secondary | ICD-10-CM | POA: Diagnosis not present

## 2015-11-04 DIAGNOSIS — Z9011 Acquired absence of right breast and nipple: Secondary | ICD-10-CM

## 2015-11-04 DIAGNOSIS — F17210 Nicotine dependence, cigarettes, uncomplicated: Secondary | ICD-10-CM

## 2015-11-04 DIAGNOSIS — G893 Neoplasm related pain (acute) (chronic): Secondary | ICD-10-CM | POA: Diagnosis not present

## 2015-11-04 DIAGNOSIS — Z79899 Other long term (current) drug therapy: Secondary | ICD-10-CM

## 2015-11-04 DIAGNOSIS — Z853 Personal history of malignant neoplasm of breast: Secondary | ICD-10-CM

## 2015-11-04 DIAGNOSIS — Z9221 Personal history of antineoplastic chemotherapy: Secondary | ICD-10-CM

## 2015-11-04 DIAGNOSIS — Z171 Estrogen receptor negative status [ER-]: Secondary | ICD-10-CM

## 2015-11-04 DIAGNOSIS — C792 Secondary malignant neoplasm of skin: Secondary | ICD-10-CM

## 2015-11-04 LAB — COMPREHENSIVE METABOLIC PANEL
ALT: 21 U/L (ref 14–54)
AST: 34 U/L (ref 15–41)
Albumin: 3.8 g/dL (ref 3.5–5.0)
Alkaline Phosphatase: 98 U/L (ref 38–126)
Anion gap: 10 (ref 5–15)
BUN: 16 mg/dL (ref 6–20)
CO2: 26 mmol/L (ref 22–32)
Calcium: 9.4 mg/dL (ref 8.9–10.3)
Chloride: 100 mmol/L — ABNORMAL LOW (ref 101–111)
Creatinine, Ser: 1.05 mg/dL — ABNORMAL HIGH (ref 0.44–1.00)
GFR calc Af Amer: >60 mL/min (ref >60)
GFR calc non Af Amer: 56 mL/min — ABNORMAL LOW (ref >60)
Glucose, Bld: 124 mg/dL — ABNORMAL HIGH (ref 65–99)
Potassium: 3.5 mmol/L (ref 3.5–5.1)
Sodium: 136 mmol/L (ref 135–145)
Total Bilirubin: 0.2 mg/dL — ABNORMAL LOW (ref 0.3–1.2)
Total Protein: 8.3 g/dL — ABNORMAL HIGH (ref 6.5–8.1)

## 2015-11-04 LAB — CBC WITH DIFFERENTIAL/PLATELET
Basophils Absolute: 0.1 10*3/uL (ref 0–0.1)
Basophils Relative: 1 %
Eosinophils Absolute: 0.1 10*3/uL (ref 0–0.7)
Eosinophils Relative: 1 %
HCT: 38.1 % (ref 35.0–47.0)
Hemoglobin: 13.1 g/dL (ref 12.0–16.0)
Lymphocytes Relative: 26 %
Lymphs Abs: 1.7 10*3/uL (ref 1.0–3.6)
MCH: 31.5 pg (ref 26.0–34.0)
MCHC: 34.4 g/dL (ref 32.0–36.0)
MCV: 91.6 fL (ref 80.0–100.0)
Monocytes Absolute: 0.6 10*3/uL (ref 0.2–0.9)
Monocytes Relative: 9 %
Neutro Abs: 4 10*3/uL (ref 1.4–6.5)
Neutrophils Relative %: 63 %
Platelets: 301 10*3/uL (ref 150–440)
RBC: 4.16 MIL/uL (ref 3.80–5.20)
RDW: 19.7 % — ABNORMAL HIGH (ref 11.5–14.5)
WBC: 6.3 10*3/uL (ref 3.6–11.0)

## 2015-11-04 NOTE — Progress Notes (Signed)
Patient is here for follow up, she does mention some discomfort in her left breast area where she had surgery. She says its pretty constant and is achy. She says rubbing the area makes it feel much better.

## 2015-11-04 NOTE — Progress Notes (Signed)
[name] [name] day:  11/04/15   Chief Complaint: [name] [name] is a 65 y.o. female with recurrent breast cancer who is seen for 2 week assessment on Aromasin and everolimus (Afinitor).  HPI: The patient was last seen in the medical oncology [name] on 10/21/2015.  At that time, imaging studies revealed progressive disease.  Exam revealed increased chest wall nodularity.  She began Aromasin.  She began everolimus on 10/29/2015.    Symptomatically, she notes a little less pain on Afinitor.  She missed her appointment with Dr. Baruch [name].  She has her appointment today with Dr. Lucile Crater at [facility].  Pain is controlled with Tramadol 50 mg every 8 hours prn.  She is cutting back on her smoking.  She is smoking 8 cigarettes a day.   Past Medical History:  Diagnosis Date  . Anemia   . Blood transfusion without reported diagnosis   . Emphysema of lung ([place])   . History of left breast cancer , known active 2013  . Personal history of malignant neoplasm of breast     Past Surgical History:  Procedure Laterality Date  . ABDOMINAL HYSTERECTOMY  1995  . BREAST BIOPSY Left 2014  . BREAST BIOPSY Left 01-21-14  . BREAST SURGERY Right 1996   mastectomy  . NEPHRECTOMY    . oophrectomy  1980    Family History  Problem Relation Age of Onset  . Cancer Father   . Heart disease Sister   . Diabetes Sister   . Heart disease Brother   . Cancer Paternal Grandmother     breast    Social History:  reports that she has been smoking Cigarettes.  She has a 6.25 pack-year smoking history. She has never used smokeless tobacco. She reports that she does not drink alcohol or use drugs.  She is smoking 1/2 pack per day.  She is alone today.  Allergies:  Allergies  Allergen Reactions  . Sulfa Antibiotics Nausea And Vomiting and Other (See Comments)    headache    Current Medications: Current Outpatient Prescriptions  Medication Sig Dispense Refill  . Calcium  Carb-Cholecalciferol (CALCIUM 600 + D PO) Take 1 tablet by mouth 2 (two) times daily.    Marland Kitchen everolimus (AFINITOR) 10 MG tablet Take 1 tablet (10 mg total) by mouth daily. 30 tablet 1  . exemestane (AROMASIN) 25 MG tablet Take 1 tablet (25 mg total) by mouth daily after breakfast. 30 tablet 1  . KLOR-CON 10 10 MEQ tablet TAKE 1 TABLET (10 MEQ TOTAL) BY MOUTH DAILY. 30 tablet 1  . LORazepam (ATIVAN) 0.5 MG tablet Take 0.5 mg by mouth daily as needed for anxiety.   0  . traMADol (ULTRAM) 50 MG tablet Take 1 tablet (50 mg total) by mouth every 8 (eight) hours as needed. 90 tablet 0  . ondansetron (ZOFRAN) 8 MG tablet Take 1 tablet (8 mg total) by mouth every 8 (eight) hours as needed for nausea or vomiting. (Patient not taking: Reported on 11/04/2015) 20 tablet 1   No current facility-administered medications for this visit.    Facility-Administered Medications Ordered in Other Visits  Medication Dose Route Frequency Provider Last Rate Last Dose  . sodium chloride 0.9 % injection 10 mL  10 mL Intravenous PRN Lequita Asal, MD   10 mL at 10/08/14 1335  . sodium chloride 0.9 % injection 10 mL  10 mL Intracatheter PRN Lequita Asal, MD   10 mL at 11/26/14 1355  Review of Systems:  GENERAL:  Feels "ok".  No fevers or sweats.  Weight down 1 pound. PERFORMANCE STATUS (ECOG):  1 HEENT:  No visual changes, runny nose, sore throat, mouth sores or tenderness. Lungs: Mild shortness of breath with exertion.  No cough.  No hemoptysis. Cardiac:  No chest pain, palpitations, orthopnea, or PND. GI:  Appetite 25%.  Drinking Ensure.  Constipation.  No nausea, vomiting, diarrhea, melena or hematochezia. GU:  No urgency, frequency, dysuria, or hematuria. Musculoskeletal:  No back pain.  No joint pain.  No muscle tenderness. Extremities:  No pain or swelling. Skin:  Hair thin.  Chest wall lesions enlarging.  No rashes or skin changes. 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  No headache, numbness or  weakness, or coordination issues. Endocrine:  No diabetes, thyroid issues, hot flashes or night sweats. Psych:  Emotional issues secondary to family.  Pain:  Chest wall lesion pain controlled with Tramadol. Review of systems:  All other systems reviewed and found to be negative.   Physical Exam: Blood pressure 115/76, pulse (!) 107, temperature 97.7 F (36.5 C), temperature source Tympanic, resp. rate 18, weight 153 lb (69.4 kg).  GENERAL: Well developed, well nourished, woman sitting in the exam room intermittently touching her left chest wall. MENTAL STATUS: Alert and oriented to person, place and time. HEAD: Short gray wig. Normocephalic, atraumatic, face symmetric, no Cushingoid features. EYES: Brown eyes. Pupils equal round and reactive to light and accomodation. No conjunctivitis or scleral icterus. ENT: Oropharynx clear without lesion. Tongue normal. Mucous membranes moist.  NECK: Left neck cyst (stable). BREAST: Coalesced deep necrotic open lesions extending over 9 cm x 7 cm. extensive disease beyond open areas.  Multiple nodules on upper abdominal wall. SKIN: Cutaneous breast cancer noted in breast section. No other lesions.  EXTREMITIES: No edema, no skin discoloration or tenderness. No palpable cords. LYMPH NODES: Left axillary ridge. No palpable cervical, supraclavicular, or inguinal adenopathy  NEUROLOGICAL: Unremarkable.  PSYCH: Appropriate.   Appointment on 11/04/2015  Component Date Value Ref Range Status  . WBC 11/04/2015 6.3  3.6 - 11.0 K/uL Final  . RBC 11/04/2015 4.16  3.80 - 5.20 MIL/uL Final  . Hemoglobin 11/04/2015 13.1  12.0 - 16.0 g/dL Final  . HCT 11/04/2015 38.1  35.0 - 47.0 % Final  . MCV 11/04/2015 91.6  80.0 - 100.0 fL Final  . MCH 11/04/2015 31.5  26.0 - 34.0 pg Final  . MCHC 11/04/2015 34.4  32.0 - 36.0 g/dL Final  . RDW 11/04/2015 19.7* 11.5 - 14.5 % Final  . Platelets 11/04/2015 301  150 - 440 K/uL Final  . Neutrophils Relative %  11/04/2015 63  % Final  . Neutro Abs 11/04/2015 4.0  1.4 - 6.5 K/uL Final  . Lymphocytes Relative 11/04/2015 26  % Final  . Lymphs Abs 11/04/2015 1.7  1.0 - 3.6 K/uL Final  . Monocytes Relative 11/04/2015 9  % Final  . Monocytes Absolute 11/04/2015 0.6  0.2 - 0.9 K/uL Final  . Eosinophils Relative 11/04/2015 1  % Final  . Eosinophils Absolute 11/04/2015 0.1  0 - 0.7 K/uL Final  . Basophils Relative 11/04/2015 1  % Final  . Basophils Absolute 11/04/2015 0.1  0 - 0.1 K/uL Final  . Sodium 11/04/2015 136  135 - 145 mmol/L Final  . Potassium 11/04/2015 3.5  3.5 - 5.1 mmol/L Final  . Chloride 11/04/2015 100* 101 - 111 mmol/L Final  . CO2 11/04/2015 26  22 - 32 mmol/L Final  .  Glucose, Bld 11/04/2015 124* 65 - 99 mg/dL Final  . BUN 11/04/2015 16  6 - 20 mg/dL Final  . Creatinine, Ser 11/04/2015 1.05* 0.44 - 1.00 mg/dL Final  . Calcium 11/04/2015 9.4  8.9 - 10.3 mg/dL Final  . Total Protein 11/04/2015 8.3* 6.5 - 8.1 g/dL Final  . Albumin 11/04/2015 3.8  3.5 - 5.0 g/dL Final  . AST 11/04/2015 34  15 - 41 U/L Final  . ALT 11/04/2015 21  14 - 54 U/L Final  . Alkaline Phosphatase 11/04/2015 98  38 - 126 U/L Final  . Total Bilirubin 11/04/2015 0.2* 0.3 - 1.2 mg/dL Final  . GFR calc non Af Amer 11/04/2015 56* >60 mL/min Final  . GFR calc Af Amer 11/04/2015 >60  >60 mL/min Final   Comment: (NOTE) The eGFR has been calculated using the CKD EPI equation. This calculation has not been validated in all clinical situations. eGFR's persistently <60 mL/min signify possible Chronic Kidney Disease.   . Anion gap 11/04/2015 10  5 - 15 Final    Assessment:  RAIYAH SPEAKMAN is a 65 y.o. African American woman with a history of stage I right breast cancer s/p modified radical mastectomy on 06/12/1996. Pathology revealed a grade III mutifocal infiltrating ductal carcinoma (tumor size 8.5 cm with an invasive component 1.6 cm). Twenty-one lymph nodes were negative. Tumor was ER/PR negative. She received 4  cycles of AC at [facility].  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 PET scan on 05/22/2012 revealed multiple areas of disease. She received 6 cycles of Taxotere and Cytoxan (TC) from 06/06/2012 - 09/29/2012. PET scan on 09/07/2012 noted marked improvement. Post chemotherapy biopsy was positive. She was not a surgical candidate. She was placed on Femara.  PET scan on 05/03/2013 revealed a new focus along lateral left breast and a new level II cervical lymph node. She received radiation from 09/13/2013 - 11/05/2013. PET scan on 01/01/2014 revealed new nodule medial left chest wall pleural/paraspinal activity LUL. Multiple biopsies on 01/19/2014 were positive. She began daily letrozole and Ibrance on 02/12/2014. Despite treatment, new nodules formed. She received a total of 2 cycles (delays in therapy).   PET scan on 05/09/2014 revealed marked progression of disease with multifocal masses in chest wall (left > right). She did not receive Imbrance in 04/2014. CA27.29 has increased: 22.9 on 01/08/2014, 54 on 03/05/2014, 170.8 on 05/06/2014, 250.8 on 05/23/2014, 445.8 on 06/14/2014, and 531.1 on 07/02/2014.   She received 3 cycles of Xeloda (02/12 - 07/05/2014). Chest and abdomen CT scan on 07/05/2014 revealed persistent and slightly progressive diffuse locally invasive chest wall tumor. Bone scan on 07/25/2014 revealed no evidence of metastatic disease.   She received 8 cycles of [place] (08/06/2014 - 01/07/2015) on ACCRU [id number] I. 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  Chest, abdomen, and pelvic CT scan on 10/24/2014 revealed improved multifocal inflammatory left breast cancer.  The dominant 2.6 x 3.5 cm lesion in the left upper outer left breast had decreased to 1.7 x 3.5 cm. The anterior sternal lesion has decreased from 2 x 4 cm to 1.5 x 3.9 cm. There were no  suspicious mediastinal, hilar or axillary adenopathy.    Chest, abdomen, and pelvic CT scan on 01/20/2015 revealed inflammatory left breast cancer with new necrotic appearing subcutaneous nodules in the lateral left breast and lateral left chest wall.   CA27.29 was 339.8 on 08/06/2014, 51.7 on 10/15/2014,  37.6 on 12/10/2014, 26.4 on 01/07/2015, 34.1 on 01/28/2015, and 49.3 on 03/10/2015,  258.6 on 06/12/2015, 421.3 on 07/10/2015, 726.3 on 07/25/2015, 911 on 08/14/2015, 1031 on 09/17/2015, and 1242 on 10/17/2015.  She received 2 cycles of Taxol given 3 weeks on/1 week off (02/10/2015 - 03/10/2015).  Chest and abdomen CT scan on 04/03/2015 revealed interval growth of superficial tumors in the lateral left breast and lateral left chest wall.  In the left lateral chest, disease had increased from 2.3 x 2 to 2.9 x 2.2 cm.  In the superficial lateral left chest wall, disease had increased from 1.7 x 1.2 cm to 2.7 x 1.8 cm.  There were stable findings of locally infiltrative tumor in the left chest wall and left axilla, including extensive left chest wall skin thickening, infiltrative soft tissue encasing the left axillary artery and focal pleural thickening in the anterior left mid pleural space.  There was no metastatic disease in the abdomen.  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  Estrogen receptor was > 90%, progesterone receptor was > 90%, and Her2/neu was 1+.    [employer] testing on 05/09/2015 noted genomic alterations of PIK3CA (J1884Z), PTEN (splice site 660-6T>K), ESR1 (Y537C), CDH1 (A634V), CDKN1B (K69f*47), MLL2 (R1903*-subclonal), and MSH6 ([id number]). Treatment options of Faslodex (ESR1) and everolimus (approved) and temsirolimus (approved therapy for other tumor types).  She received Faslodex from 05/08/2015 - 09/19/2015. She received 3 cycles of Ibrance (07/26/2015 - 09/19/2015).  Chest, abdomen, and pelvic CT  scans on 07/09/2015 revealed interval progression of disease in the left anterior and lateral chest wall.  There was interval development of nodular pleural thickening in the posterior medial left hemi thorax with some focal fluid in the left major fissure. Findings raised concern for interval development of pleural involvement by tumor.  There was no evidence for metastatic disease in the abdomen   Chest, abdomen, and pelvic CT scan on 10/20/2015 revealed progression of subcarinal lymph node (now 1.1 cm).  There was the interval development of trace pericardial effusion with some pericardial nodularity along the left heart border.  There was progression of fissural fluid in the left hemi thorax with increase in left pleural nodularity.  There was progression of left antral lateral chest wall disease with invasion of the left pectoralis major muscle.  Subcutaneous metastatic nodules had progressed.  She is currently day 14 of Aromasin (began 10/21/2015) and day 7 of everolimus (began 10/29/2015).  Symptomatically, she has significant chest wall nodularity with deep open necrotic areas.  She has nodules across her abdominal wall. Pain is controlled with Tramadol.  Plan: 1.  Labs today:  CBC with diff, CMP. 2.  Continue Aromasin and Afinitor (everolimus). 3,  Follow-up with Dr. ChBaruch Goutyoday- seen in [name]. 4.  [facility] 5.  Assist with obtaining dressing material. 6.  Discuss pain management.  Patient wishes to continue Tramadol alone. 7.  Encourage smoking cessation. 8.  RTC in 1 week for MD reassessment.    MeLequita AsalMD  11/04/2015, 9:59 AM

## 2015-11-04 NOTE — Progress Notes (Signed)
Except an outstanding is perfect of Radiation Oncology NEW PATIENT EVALUATION  Name: [name] [name]  MRN: [id number]  Date:   11/04/2015     DOB: [birth year]   This 65 y.o. 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 provider found  CHIEF COMPLAINT: No chief complaint on file.   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   PREVIOUS INVESTIGATIONS:  CT scans reviewed Clinical notes reviewed Previous pathology reports reviewed  HPI: Patient is a 65 year old female well-known to department having been treated back over 2 years prior for stage IIIB invasive mammary carcinoma of the left chest. She was status post neoadjuvant chemotherapy and then underwent left breast and peripheral lymphatic radiation therapy to 5000 cGy with a 2000 cGy electron  boost. Over the past 2 years she's been treated with multiple chemotherapy agents most recently I Brandts and Faslodex. She's developed ulcerated left chest wall lesion. She also has evidence of progression of subcarinal lymph nodes. She is currently on tramadol for pain control. She is recently been switched to Aromasin 25 mg a day. Recent CT scan shows progression of subcarinal lymph node lymph nodes trace pericardial effusion and progression of left lateral chest disease with invasion of left pectoralis major which is progressing. She has an appointment at [facility] for a second opinion as far as protocol I been asked to see the patient for possible further radiation therapy to left chest wall to control some the ulcerative disease.  PLANNED TREATMENT REGIMEN: Possible palliative treatment to left chest  wall  PAST MEDICAL HISTORY:  has a past medical history of Anemia; Blood transfusion without reported diagnosis; Emphysema of lung ([place]); History of left breast cancer , known active (2013); and Personal history of malignant neoplasm of breast.    PAST SURGICAL HISTORY:  Past Surgical History:  Procedure Laterality Date  . ABDOMINAL HYSTERECTOMY  1995  . BREAST BIOPSY Left 2014  . BREAST BIOPSY Left 01-21-14  . BREAST SURGERY Right 1996   mastectomy  . NEPHRECTOMY    . oophrectomy  1980    FAMILY HISTORY: family history includes Cancer in her father and paternal grandmother; Diabetes in her sister; Heart disease in her brother and sister.  SOCIAL HISTORY:  reports that she has been smoking Cigarettes.  She has a 6.25 pack-year smoking history. She has never used smokeless tobacco. She reports that she does not drink alcohol or use drugs.  ALLERGIES: Sulfa antibiotics  MEDICATIONS:  Current Outpatient Prescriptions  Medication Sig Dispense Refill  . Calcium Carb-Cholecalciferol (CALCIUM 600 + D PO) Take 1 tablet by mouth 2 (two) times daily.    Marland Kitchen everolimus (AFINITOR) 10 MG tablet Take 1 tablet (10 mg total) by mouth daily. 30 tablet 1  . exemestane (AROMASIN) 25 MG tablet Take 1 tablet (25 mg total) by mouth daily after breakfast. 30 tablet 1  . KLOR-CON 10 10 MEQ tablet TAKE 1 TABLET (10 MEQ TOTAL) BY MOUTH DAILY. 30 tablet 1  . LORazepam (ATIVAN) 0.5 MG tablet Take 0.5 mg by mouth daily as needed for anxiety.   0  . ondansetron (ZOFRAN) 8 MG tablet Take 1 tablet (8 mg total) by mouth every 8 (eight) hours as  needed for nausea or vomiting. (Patient not taking: Reported on 11/04/2015) 20 tablet 1  . traMADol (ULTRAM) 50 MG tablet Take 1 tablet (50 mg total) by mouth every 8 (eight) hours as needed. 90 tablet 0   No current facility-administered medications for this encounter.    Facility-Administered Medications Ordered in Other Encounters  Medication Dose Route Frequency Provider  Last Rate Last Dose  . sodium chloride 0.9 % injection 10 mL  10 mL Intravenous PRN Lequita Asal, MD   10 mL at 10/08/14 1335  . sodium chloride 0.9 % injection 10 mL  10 mL Intracatheter PRN Lequita Asal, MD   10 mL at 11/26/14 1355    ECOG PERFORMANCE STATUS:  1 - Symptomatic but completely ambulatory  REVIEW OF SYSTEMS: Except for the pain from her left chest wall ulcerative disease.  Patient denies any weight loss, fatigue, weakness, fever, chills or night sweats. Patient denies any loss of vision, blurred vision. Patient denies any ringing  of the ears or hearing loss. No irregular heartbeat. Patient denies heart murmur or history of fainting. Patient denies any chest pain or pain radiating to her upper extremities. Patient denies any shortness of breath, difficulty breathing at night, cough or hemoptysis. Patient denies any swelling in the lower legs. Patient denies any nausea vomiting, vomiting of blood, or coffee ground material in the vomitus. Patient denies any stomach pain. Patient states has had normal bowel movements no significant constipation or diarrhea. Patient denies any dysuria, hematuria or significant nocturia. Patient denies any problems walking, swelling in the joints or loss of balance. Patient denies any skin changes, loss of hair or loss of weight. Patient denies any excessive worrying or anxiety or significant depression. Patient denies any problems with insomnia. Patient denies excessive thirst, polyuria, polydipsia. Patient denies any swollen glands, patient denies easy bruising or easy bleeding. Patient denies any recent infections, allergies or URI. Patient "s visual fields have not changed significantly in recent time.     PHYSICAL EXAM: There were no vitals taken for this visit. Patient is area of the chest wall bandaged although there is obvious ulcerative disease in tethering of the skin to the underlying pectoralis muscle. There is also small lymph nodes  in the left supraclavicular fossa is a small satellite lesions scattered throughout the left chest wall. Well-developed well-nourished patient in NAD. HEENT reveals PERLA, EOMI, discs not visualized.  Oral cavity is clear. No oral mucosal lesions are identified. Neck is clear without evidence of cervical or supraclavicular adenopathy. Lungs are clear to A&P. Cardiac examination is essentially unremarkable with regular rate and rhythm without murmur rub or thrill. Abdomen is benign with no organomegaly or masses noted. Motor sensory and DTR levels are equal and symmetric in the upper and lower extremities. Cranial nerves II through XII are grossly intact. Proprioception is intact. No peripheral adenopathy or edema is identified. No motor or sensory levels are noted. Crude visual fields are within normal range.  LABORATORY DATA: Pathology reports reviewed    RADIOLOGY RESULTS: CT scans reviewed compatible above-stated findings   IMPRESSION: Progressive breast cancer stage IV with ulceration of the left chest wall and patient previously irradiated over 2 years prior  PLAN: At this time I believe we can offer some further palliative radiation therapy to her left chest wall. I like her to be evaluated at [facility] for her second opinion since issues going on protocol they may not want any further palliative radiation therapy at this time. Should she  be declined for that or in their opinion is advisable I will plan on delivering 4000 cGy over 4 weeks to try to control some of the ulcerative disease and decrease the pain threshold. Risks and benefits of treatment including treating the area of previously high-dose radiation, fatigue, inclusion of superficial lung and possible heart, alteration of blood counts all were discussed in detail with the patient. I will await the second opinion from [facility] before scheduling palliative treatment. I discussed the case personally with medical oncology.  I would like to take  this opportunity to thank you for allowing me to participate in the care of your patient.Armstead Peaks., MD

## 2015-11-09 ENCOUNTER — Encounter: Payer: Self-pay | Admitting: Hematology and Oncology

## 2015-11-10 ENCOUNTER — Other Ambulatory Visit: Payer: Self-pay | Admitting: Hematology and Oncology

## 2015-11-10 NOTE — Progress Notes (Signed)
[facility] day:  11/11/15   Chief Complaint: [name] [name] is a 65 y.o. female with recurrent breast cancer who is seen for assessment on Aromasin and everolimus (Afinitor) and after second opinion at [facility].  HPI: The patient was last seen in the medical oncology clinic on 11/04/2015.  At that time, she had been on Aromasin and Afinitor for 1 week.  She was scheduled to see Dr. Lucile Crater at [facility] for second opinion and potential treatment options.  She met with Dr. Beverley Fiedler.  She was felt to have limited treatment options.  He did not think additional radiation would be beneficial.  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  Mutational burden was TMB-intermediate; 12 Muts/Mb.  Pembrolizumab [facility]) was felt to be an option.  Symptomatically, she has had a few bad days.  She had constipation for a week.  She finally had results with Miralax.  He appetite and oral intake decreased during that time.  She has used Tramadol 100 mg twice a day when her chest wall pain was bad.  She notes that a chest wall lesion opened recently and bled.  Bleeding stopped after putting pressure on it.  She is not taking any ibuprofen or aspirin.   Past Medical History:  Diagnosis Date  . Anemia   . Blood transfusion without reported diagnosis   . Emphysema of lung ([place])   . History of left breast cancer , known active 2013  . Personal history of malignant neoplasm of breast     Past Surgical History:  Procedure Laterality Date  . ABDOMINAL HYSTERECTOMY  1995  . BREAST BIOPSY Left 2014  . BREAST BIOPSY Left 01-21-14  . BREAST SURGERY Right 1996   mastectomy  . NEPHRECTOMY    . oophrectomy  1980    Family History  Problem Relation Age of Onset  . Cancer Father   . Heart disease Sister   . Diabetes Sister   . Heart disease Brother   . Cancer Paternal Grandmother     breast    Social History:   reports that she has been smoking Cigarettes.  She has a 6.25 pack-year smoking history. She has never used smokeless tobacco. She reports that she does not drink alcohol or use drugs.  She is smoking 1/2 pack per day.  She is alone today.  Allergies:  Allergies  Allergen Reactions  . Sulfa Antibiotics Nausea And Vomiting and Other (See Comments)    headache    Current Medications: Current Outpatient Prescriptions  Medication Sig Dispense Refill  . Calcium Carb-Cholecalciferol (CALCIUM 600 + D PO) Take 1 tablet by mouth 2 (two) times daily.    Marland Kitchen everolimus (AFINITOR) 10 MG tablet Take 1 tablet (10 mg total) by mouth daily. 30 tablet 1  . exemestane (AROMASIN) 25 MG tablet Take 1 tablet (25 mg total) by mouth daily after breakfast. 30 tablet 1  . KLOR-CON 10 10 MEQ tablet TAKE 1 TABLET (10 MEQ TOTAL) BY MOUTH DAILY. 30 tablet 1  . LORazepam (ATIVAN) 0.5 MG tablet Take 0.5 mg by mouth daily as needed for anxiety.   0  . traMADol (ULTRAM) 50 MG tablet Take 1 tablet (50 mg total) by mouth every 8 (eight) hours as needed. 90 tablet 0  . ondansetron (ZOFRAN) 8 MG tablet Take 1 tablet (8 mg total) by mouth every 8 (eight) hours as needed for nausea or vomiting. (  Patient not taking: Reported on 11/04/2015) 20 tablet 1   No current facility-administered medications for this visit.    Facility-Administered Medications Ordered in Other Visits  Medication Dose Route Frequency Provider Last Rate Last Dose  . sodium chloride 0.9 % injection 10 mL  10 mL Intravenous PRN Lequita Asal, MD   10 mL at 10/08/14 1335  . sodium chloride 0.9 % injection 10 mL  10 mL Intracatheter PRN Lequita Asal, MD   10 mL at 11/26/14 1355    Review of Systems:  GENERAL:  Feels "ok".  No fevers or sweats.  Weight stable. PERFORMANCE STATUS (ECOG):  1 HEENT:  No visual changes, runny nose, sore throat, mouth sores or tenderness. Lungs: Mild shortness of breath with exertion.  No cough.  No hemoptysis. Cardiac:   No chest pain, palpitations, orthopnea, or PND. GI:  Appetite fair.  Constipation x 1 week, resolved.  No nausea, vomiting, diarrhea, melena or hematochezia. GU:  No urgency, frequency, dysuria, or hematuria. Musculoskeletal:  No back pain.  No joint pain.  No muscle tenderness.  Interval muscle cramps. Extremities:  No pain or swelling. Skin:  Hair thin.  Chest wall lesions enlarging.  Bleeding chest wall lesion.  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  No headache, numbness or weakness, or coordination issues. Endocrine:  No diabetes, thyroid issues, hot flashes or night sweats. Psych:  Emotional issues secondary to family.  Pain:  Chest wall lesion pain controlled with increased dose of Tramadol. Review of systems:  All other systems reviewed and found to be negative.   Physical Exam: Blood pressure 121/76, pulse (!) 115, temperature 98.6 F (37 C), temperature source Tympanic, height 5' 7"  (1.702 m), weight 153 lb (69.4 kg), SpO2 100 %.  GENERAL: Well developed, well nourished, woman sitting in the exam room in no acute distress. MENTAL STATUS: Alert and oriented to person, place and time. HEAD: Short gray wig. Normocephalic, atraumatic, face symmetric, no Cushingoid features. EYES: Brown eyes. Pupils equal round and reactive to light and accomodation. No conjunctivitis or scleral icterus. ENT: Oropharynx clear without lesion. Tongue normal. Mucous membranes moist.  NECK: Left neck cyst (stable). BREAST: Dressing covering left anterior chest wall (removed).  No active sites of bleeding.  Coalesced deep necrotic open lesions extending over 9 cm x 7 cm. Extensive disease beyond open areas.  Multiple nodules on upper abdominal wall. SKIN: Cutaneous breast cancer noted in breast section. No other lesions.  EXTREMITIES: No edema, no skin discoloration or tenderness. No palpable cords. LYMPH NODES: Left axillary ridge. No palpable cervical, supraclavicular, or inguinal  adenopathy  NEUROLOGICAL: Unremarkable.  PSYCH: Appropriate.   No visits with results within 3 Day(s) from this visit.  Latest known visit with results is:  Appointment on 11/04/2015  Component Date Value Ref Range Status  . WBC 11/04/2015 6.3  3.6 - 11.0 K/uL Final  . RBC 11/04/2015 4.16  3.80 - 5.20 MIL/uL Final  . Hemoglobin 11/04/2015 13.1  12.0 - 16.0 g/dL Final  . HCT 11/04/2015 38.1  35.0 - 47.0 % Final  . MCV 11/04/2015 91.6  80.0 - 100.0 fL Final  . MCH 11/04/2015 31.5  26.0 - 34.0 pg Final  . MCHC 11/04/2015 34.4  32.0 - 36.0 g/dL Final  . RDW 11/04/2015 19.7* 11.5 - 14.5 % Final  . Platelets 11/04/2015 301  150 - 440 K/uL Final  . Neutrophils Relative % 11/04/2015 63  % Final  . Neutro Abs 11/04/2015 4.0  1.4 -  6.5 K/uL Final  . Lymphocytes Relative 11/04/2015 26  % Final  . Lymphs Abs 11/04/2015 1.7  1.0 - 3.6 K/uL Final  . Monocytes Relative 11/04/2015 9  % Final  . Monocytes Absolute 11/04/2015 0.6  0.2 - 0.9 K/uL Final  . Eosinophils Relative 11/04/2015 1  % Final  . Eosinophils Absolute 11/04/2015 0.1  0 - 0.7 K/uL Final  . Basophils Relative 11/04/2015 1  % Final  . Basophils Absolute 11/04/2015 0.1  0 - 0.1 K/uL Final  . Sodium 11/04/2015 136  135 - 145 mmol/L Final  . Potassium 11/04/2015 3.5  3.5 - 5.1 mmol/L Final  . Chloride 11/04/2015 100* 101 - 111 mmol/L Final  . CO2 11/04/2015 26  22 - 32 mmol/L Final  . Glucose, Bld 11/04/2015 124* 65 - 99 mg/dL Final  . BUN 11/04/2015 16  6 - 20 mg/dL Final  . Creatinine, Ser 11/04/2015 1.05* 0.44 - 1.00 mg/dL Final  . Calcium 11/04/2015 9.4  8.9 - 10.3 mg/dL Final  . Total Protein 11/04/2015 8.3* 6.5 - 8.1 g/dL Final  . Albumin 11/04/2015 3.8  3.5 - 5.0 g/dL Final  . AST 11/04/2015 34  15 - 41 U/L Final  . ALT 11/04/2015 21  14 - 54 U/L Final  . Alkaline Phosphatase 11/04/2015 98  38 - 126 U/L Final  . Total Bilirubin 11/04/2015 0.2* 0.3 - 1.2 mg/dL Final  . GFR calc non Af Amer 11/04/2015 56* >60 mL/min Final   . GFR calc Af Amer 11/04/2015 >60  >60 mL/min Final   Comment: (NOTE) The eGFR has been calculated using the CKD EPI equation. This calculation has not been validated in all clinical situations. eGFR's persistently <60 mL/min signify possible Chronic Kidney Disease.   . Anion gap 11/04/2015 10  5 - 15 Final    Assessment:  [name] [name] is a 65 y.o. African American woman with a history of stage I right breast cancer s/p modified radical mastectomy on 06/12/1996. Pathology revealed a grade III mutifocal infiltrating ductal carcinoma (tumor size 8.5 cm with an invasive component 1.6 cm). Twenty-one lymph nodes were negative. Tumor was ER/PR negative. She received 4 cycles of AC at [facility].  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 PET scan on 05/22/2012 revealed multiple areas of disease. She received 6 cycles of Taxotere and Cytoxan (TC) from 06/06/2012 - 09/29/2012. PET scan on 09/07/2012 noted marked improvement. Post chemotherapy biopsy was positive. She was not a surgical candidate. She was placed on Femara.  PET scan on 05/03/2013 revealed a new focus along lateral left breast and a new level II cervical lymph node. She received radiation from 09/13/2013 - 11/05/2013. PET scan on 01/01/2014 revealed new nodule medial left chest wall pleural/paraspinal activity LUL. Multiple biopsies on 01/19/2014 were positive. She began daily letrozole and Ibrance on 02/12/2014. Despite treatment, new nodules formed. She received a total of 2 cycles (delays in therapy).   PET scan on 05/09/2014 revealed marked progression of disease with multifocal masses in chest wall (left > right). She did not receive Imbrance in 04/2014. CA27.29 has increased: 22.9 on 01/08/2014, 54 on 03/05/2014, 170.8 on 05/06/2014, 250.8 on 05/23/2014, 445.8 on 06/14/2014, and 531.1 on 07/02/2014.    She received 3 cycles of Xeloda (02/12 - 07/05/2014). Chest and abdomen CT scan on 07/05/2014 revealed persistent and slightly progressive diffuse locally invasive chest wall tumor. Bone scan on 07/25/2014 revealed no  evidence of metastatic disease.   She received 8 cycles of [place] (08/06/2014 - 01/07/2015) on ACCRU [id number] I. 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  Chest, abdomen, and pelvic CT scan on 10/24/2014 revealed improved multifocal inflammatory left breast cancer.  The dominant 2.6 x 3.5 cm lesion in the left upper outer left breast had decreased to 1.7 x 3.5 cm. The anterior sternal lesion has decreased from 2 x 4 cm to 1.5 x 3.9 cm. There were no suspicious mediastinal, hilar or axillary adenopathy.    Chest, abdomen, and pelvic CT scan on 01/20/2015 revealed inflammatory left breast cancer with new necrotic appearing subcutaneous nodules in the lateral left breast and lateral left chest wall.   CA27.29 was 339.8 on 08/06/2014, 51.7 on 10/15/2014, 37.6 on 12/10/2014, 26.4 on 01/07/2015, 34.1 on 01/28/2015, and 49.3 on 03/10/2015,  258.6 on 06/12/2015, 421.3 on 07/10/2015, 726.3 on 07/25/2015, 911 on 08/14/2015, 1031 on 09/17/2015, and 1242 on 10/17/2015.  She received 2 cycles of Taxol given 3 weeks on/1 week off (02/10/2015 - 03/10/2015).  Chest and abdomen CT scan on 04/03/2015 revealed interval growth of superficial tumors in the lateral left breast and lateral left chest wall.  In the left lateral chest, disease had increased from 2.3 x 2 to 2.9 x 2.2 cm.  In the superficial lateral left chest wall, disease had increased from 1.7 x 1.2 cm to 2.7 x 1.8 cm.  There were stable findings of locally infiltrative tumor in the left chest wall and left axilla, including extensive left chest wall skin thickening, infiltrative soft tissue encasing the left axillary artery and focal pleural thickening in the anterior left mid pleural space.  There was no metastatic disease in the  abdomen.  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  Estrogen receptor was > 90%, progesterone receptor was > 90%, and Her2/neu was 1+.    Foundation One testing on 05/09/2015 noted genomic alterations of PIK3CA (T7711A), PTEN (splice site [id number]), ESR1 (Y537C), CDH1 (A634V), CDKN1B (K51f*47), MLL2 (R1903*-subclonal), and MSH6 ([id number]). Treatment options of Faslodex (ESR1) and everolimus (approved) and temsirolimus (approved therapy for other tumor types).  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  Mutational burden was TMB-intermediate; 12 Muts/Mb.  She received Faslodex from 05/08/2015 - 09/19/2015. She received 3 cycles of Ibrance (07/26/2015 - 09/19/2015).  Chest, abdomen, and pelvic CT scans on 07/09/2015 revealed interval progression of disease in the left anterior and lateral chest wall.  There was interval development of nodular pleural thickening in the posterior medial left hemi thorax with some focal fluid in the left major fissure. Findings raised concern for interval development of pleural involvement by tumor.  There was no evidence for metastatic disease in the abdomen   Chest, abdomen, and pelvic CT scan on 10/20/2015 revealed progression of subcarinal lymph node (now 1.1 cm).  There was the interval development of trace pericardial effusion with some pericardial nodularity along the left heart border.  There was progression of fissural fluid in the left hemi thorax with increase in left pleural nodularity.  There was progression of left antral lateral chest wall disease with invasion of the left pectoralis major muscle.  Subcutaneous metastatic nodules had progressed.  She is currently day 22 of Aromasin (began 10/21/2015) and day 14 of everolimus (began 10/29/2015).  Symptomatically, she has significant chest wall nodularity with deep open necrotic areas.   She has nodules across her  abdominal wall. Pain is controlled with increased dose of Tramadol.  Constipation has resolved.  Plan: 1.  Discuss consult with Dr. Beverley Fiedler at [facility].  Discuss no plan for radiation.  Discuss consideration of Keytruda.  Side effects reviewed. 2.  Discuss pain management.  Discuss Tramadol 50-100 mg q 6 hours prn pain.   3.  Discuss management of constipation. 4.  Continue Aromasin and Afinitor (everolimus) until switch in therapy. 5.  Phone follow-up with Dr. Beverley Fiedler. 6.  Preauth pembrolizumab (Keytruda) 200 mg IV q 3 weeks. 7.  RTC for MD assessment, lab (CBC with diff, CMP, CA27.29) and cycle #1 Keytruda.   Lequita Asal, MD  11/11/2015, 10:40 AM

## 2015-11-11 ENCOUNTER — Encounter: Payer: Self-pay | Admitting: Hematology and Oncology

## 2015-11-11 ENCOUNTER — Inpatient Hospital Stay: Payer: Medicare Other | Attending: Hematology and Oncology | Admitting: Hematology and Oncology

## 2015-11-11 VITALS — BP 121/76 | HR 115 | Temp 98.6°F | Ht 67.0 in | Wt 153.0 lb

## 2015-11-11 DIAGNOSIS — Z79899 Other long term (current) drug therapy: Secondary | ICD-10-CM | POA: Diagnosis not present

## 2015-11-11 DIAGNOSIS — Z853 Personal history of malignant neoplasm of breast: Secondary | ICD-10-CM | POA: Diagnosis not present

## 2015-11-11 DIAGNOSIS — F419 Anxiety disorder, unspecified: Secondary | ICD-10-CM | POA: Insufficient documentation

## 2015-11-11 DIAGNOSIS — F17210 Nicotine dependence, cigarettes, uncomplicated: Secondary | ICD-10-CM | POA: Diagnosis not present

## 2015-11-11 DIAGNOSIS — C792 Secondary malignant neoplasm of skin: Secondary | ICD-10-CM

## 2015-11-11 DIAGNOSIS — J439 Emphysema, unspecified: Secondary | ICD-10-CM | POA: Diagnosis not present

## 2015-11-11 DIAGNOSIS — I313 Pericardial effusion (noninflammatory): Secondary | ICD-10-CM | POA: Insufficient documentation

## 2015-11-11 DIAGNOSIS — Z9221 Personal history of antineoplastic chemotherapy: Secondary | ICD-10-CM | POA: Diagnosis not present

## 2015-11-11 DIAGNOSIS — K5900 Constipation, unspecified: Secondary | ICD-10-CM

## 2015-11-11 DIAGNOSIS — Z171 Estrogen receptor negative status [ER-]: Secondary | ICD-10-CM | POA: Insufficient documentation

## 2015-11-11 DIAGNOSIS — G893 Neoplasm related pain (acute) (chronic): Secondary | ICD-10-CM

## 2015-11-11 DIAGNOSIS — C50912 Malignant neoplasm of unspecified site of left female breast: Secondary | ICD-10-CM | POA: Insufficient documentation

## 2015-11-11 DIAGNOSIS — I10 Essential (primary) hypertension: Secondary | ICD-10-CM | POA: Insufficient documentation

## 2015-11-11 DIAGNOSIS — G629 Polyneuropathy, unspecified: Secondary | ICD-10-CM | POA: Diagnosis not present

## 2015-11-11 DIAGNOSIS — Z9011 Acquired absence of right breast and nipple: Secondary | ICD-10-CM | POA: Diagnosis not present

## 2015-11-11 NOTE — Progress Notes (Signed)
Patient here for follow up. No complaints today. She has experienced a bout of constipation since her last visit that is now resolved.

## 2015-11-12 ENCOUNTER — Other Ambulatory Visit: Payer: Self-pay | Admitting: *Deleted

## 2015-11-12 DIAGNOSIS — C50912 Malignant neoplasm of unspecified site of left female breast: Secondary | ICD-10-CM

## 2015-11-12 DIAGNOSIS — C792 Secondary malignant neoplasm of skin: Secondary | ICD-10-CM

## 2015-11-13 ENCOUNTER — Ambulatory Visit: Payer: Medicare Other | Admitting: Radiation Oncology

## 2015-11-18 ENCOUNTER — Telehealth: Payer: Self-pay | Admitting: *Deleted

## 2015-11-18 ENCOUNTER — Other Ambulatory Visit: Payer: Self-pay | Admitting: *Deleted

## 2015-11-18 ENCOUNTER — Encounter: Payer: Medicare Other | Attending: Surgery | Admitting: Surgery

## 2015-11-18 DIAGNOSIS — C50812 Malignant neoplasm of overlapping sites of left female breast: Secondary | ICD-10-CM | POA: Diagnosis present

## 2015-11-18 DIAGNOSIS — C50412 Malignant neoplasm of upper-outer quadrant of left female breast: Secondary | ICD-10-CM | POA: Insufficient documentation

## 2015-11-18 DIAGNOSIS — X58XXXA Exposure to other specified factors, initial encounter: Secondary | ICD-10-CM | POA: Insufficient documentation

## 2015-11-18 DIAGNOSIS — S21001A Unspecified open wound of right breast, initial encounter: Secondary | ICD-10-CM | POA: Diagnosis not present

## 2015-11-18 DIAGNOSIS — I313 Pericardial effusion (noninflammatory): Secondary | ICD-10-CM

## 2015-11-18 DIAGNOSIS — Z9221 Personal history of antineoplastic chemotherapy: Secondary | ICD-10-CM | POA: Diagnosis not present

## 2015-11-18 DIAGNOSIS — C792 Secondary malignant neoplasm of skin: Secondary | ICD-10-CM

## 2015-11-18 DIAGNOSIS — I3139 Other pericardial effusion (noninflammatory): Secondary | ICD-10-CM

## 2015-11-18 DIAGNOSIS — I959 Hypotension, unspecified: Secondary | ICD-10-CM | POA: Insufficient documentation

## 2015-11-18 DIAGNOSIS — C50912 Malignant neoplasm of unspecified site of left female breast: Secondary | ICD-10-CM

## 2015-11-18 DIAGNOSIS — S21002A Unspecified open wound of left breast, initial encounter: Secondary | ICD-10-CM | POA: Insufficient documentation

## 2015-11-18 NOTE — Progress Notes (Signed)
[name] [name], [name] [name] ([id number]) Visit Report for 11/18/2015 Arrival Information Details Patient Name: [name] [name], [name] [name] Date of Service: 11/18/2015 10:45 AM Medical Record Number: [id number] Patient Account Number: [id number] Date of Birth/Sex: 12/26/1950 (64 y.o. Female) Treating RN: Afful, RN, BSN, [employer] Primary Care Physician: PATIENT, NO Other Clinician: Referring Physician: Treating Physician/Extender: Frann Rider in Treatment: 72 Visit Information History Since Last Visit All ordered tests and consults were completed: No Patient Arrived: Ambulatory Added or deleted any medications: No Arrival Time: 10:54 Any new allergies or adverse reactions: No Accompanied By: SELF Had a fall or experienced change in No Transfer Assistance: None activities of daily living that may affect Patient Identification Verified: Yes risk of falls: Secondary Verification Process Yes Signs or symptoms of abuse/neglect since last No Completed: visito Patient Requires Transmission-Based No Hospitalized since last visit: No Precautions: Has Dressing in Place as Prescribed: Yes Patient Has Alerts: No Pain Present Now: No Electronic Signature(s) Signed: 11/18/2015 4:40:39 PM By: Regan Lemming BSN, RN Entered By: Regan Lemming on 11/18/2015 10:55:09 [name] [name] ([id number]) -------------------------------------------------------------------------------- Clinic Level of Care Assessment Details Patient Name: [name] [name] Date of Service: 11/18/2015 10:45 AM Medical Record Number: [id number] Patient Account Number: [id number] Date of Birth/Sex: Nov 07, 1950 (64 y.o. Female) Treating RN: Afful, RN, BSN, Administrator, sports Primary Care Physician: PATIENT, NO Other Clinician: Referring Physician: Treating Physician/Extender: Frann Rider in Treatment: 32 Clinic Level of Care Assessment Items TOOL 4 Quantity Score []  - Use when only an EandM is performed on FOLLOW-UP visit 0 ASSESSMENTS - Nursing  Assessment / Reassessment X - Reassessment of Co-morbidities (includes updates in patient status) 1 10 X - Reassessment of Adherence to Treatment Plan 1 5 ASSESSMENTS - Wound and Skin Assessment / Reassessment X - Simple Wound Assessment / Reassessment - one wound 1 5 []  - Complex Wound Assessment / Reassessment - multiple wounds 0 []  - Dermatologic / Skin Assessment (not related to wound area) 0 ASSESSMENTS - Focused Assessment []  - Circumferential Edema Measurements - multi extremities 0 []  - Nutritional Assessment / Counseling / Intervention 0 []  - Lower Extremity Assessment (monofilament, tuning fork, pulses) 0 []  - Peripheral Arterial Disease Assessment (using hand held doppler) 0 ASSESSMENTS - Ostomy and/or Continence Assessment and Care []  - Incontinence Assessment and Management 0 []  - Ostomy Care Assessment and Management (repouching, etc.) 0 PROCESS - Coordination of Care X - Simple Patient / Family Education for ongoing care 1 15 []  - Complex (extensive) Patient / Family Education for ongoing care 0 []  - Staff obtains Programmer, systems, Records, Test Results / Process Orders 0 []  - Staff telephones HHA, Nursing Homes / Clarify orders / etc 0 []  - Routine Transfer to another Facility (non-emergent condition) 0 [name] [name], [name] [name] ([id number]) []  - Routine Hospital Admission (non-emergent condition) 0 []  - New Admissions / Biomedical engineer / Ordering NPWT, Apligraf, etc. 0 []  - Emergency Hospital Admission (emergent condition) 0 []  - Simple Discharge Coordination 0 []  - Complex (extensive) Discharge Coordination 0 PROCESS - Special Needs []  - Pediatric / Minor Patient Management 0 []  - Isolation Patient Management 0 []  - Hearing / Language / Visual special needs 0 []  - Assessment of Community assistance (transportation, D/C planning, etc.) 0 []  - Additional assistance / Altered mentation 0 []  - Support Surface(s) Assessment (bed, cushion, seat, etc.) 0 INTERVENTIONS - Wound  Cleansing / Measurement []  - Simple Wound Cleansing - one wound 0 X - Complex Wound Cleansing - multiple wounds 2 5 X - Wound Imaging (photographs -  any number of wounds) 1 5 []  - Wound Tracing (instead of photographs) 0 []  - Simple Wound Measurement - one wound 0 X - Complex Wound Measurement - multiple wounds 2 5 INTERVENTIONS - Wound Dressings X - Small Wound Dressing one or multiple wounds 2 10 []  - Medium Wound Dressing one or multiple wounds 0 []  - Large Wound Dressing one or multiple wounds 0 []  - Application of Medications - topical 0 []  - Application of Medications - injection 0 INTERVENTIONS - Miscellaneous []  - External ear exam 0 [name] [name], [name] ([id number]) []  - Specimen Collection (cultures, biopsies, blood, body fluids, etc.) 0 []  - Specimen(s) / Culture(s) sent or taken to Lab for analysis 0 []  - Patient Transfer (multiple staff / Harrel Lemon Lift / Similar devices) 0 []  - Simple Staple / Suture removal (25 or less) 0 []  - Complex Staple / Suture removal (26 or more) 0 []  - Hypo / Hyperglycemic Management (close monitor of Blood Glucose) 0 []  - Ankle / Brachial Index (ABI) - do not check if billed separately 0 X - Vital Signs 1 5 Has the patient been seen at the hospital within the last three years: Yes Total Score: 85 Level Of Care: New/Established - Level 3 Electronic Signature(s) Signed: 11/18/2015 4:40:39 PM By: Regan Lemming BSN, RN Entered By: Regan Lemming on 11/18/2015 11:20:24 [name] [name] ([id number]) -------------------------------------------------------------------------------- Encounter Discharge Information Details Patient Name: [name] [name] Date of Service: 11/18/2015 10:45 AM Medical Record Number: [id number] Patient Account Number: [id number] Date of Birth/Sex: 1950/12/26 (64 y.o. Female) Treating RN: Afful, RN, BSN, [employer] Harman Primary Care Physician: PATIENT, NO Other Clinician: Referring Physician: Treating Physician/Extender: Frann Rider in  Treatment: 51 Encounter Discharge Information Items Discharge Pain Level: 0 Discharge Condition: Stable Ambulatory Status: Ambulatory Discharge Destination: Home Transportation: Private Auto Accompanied By: self Schedule Follow-up Appointment: No Medication Reconciliation completed and provided to Patient/Care No [name] [name]: Provided on Clinical Summary of Care: 11/18/2015 Form Type Recipient Paper Patient EG Electronic Signature(s) Signed: 11/18/2015 11:39:20 AM By: Regan Lemming BSN, RN Previous Signature: 11/18/2015 11:18:46 AM Version By: Ruthine Dose Entered By: Regan Lemming on 11/18/2015 11:39:20 [name] [name] ([id number]) -------------------------------------------------------------------------------- Multi Wound Chart Details Patient Name: [name] [name] Date of Service: 11/18/2015 10:45 AM Medical Record Number: [id number] Patient Account Number: [id number] Date of Birth/Sex: [birth year] (64 y.o. Female) Treating RN: Afful, RN, BSN, Velva Harman Primary Care Physician: PATIENT, NO Other Clinician: Referring Physician: Treating Physician/Extender: Frann Rider in Treatment: 70 Vital Signs Height(in): 68 Pulse(bpm): 113 Weight(lbs): 180.5 Blood Pressure 114/74 (mmHg): Body Mass Index(BMI): 27 Temperature(F): 98.1 Respiratory Rate 18 (breaths/min): Photos: [1:No Photos] [4:No Photos] [N/A:N/A] Wound Location: [1:Left Chest] [4:Left Chest - Distal] [N/A:N/A] Wounding Event: [1:Other Lesion] [4:Radiation Burn] [N/A:N/A] Primary Etiology: [1:Malignant Wound] [4:Malignant Wound] [N/A:N/A] Comorbid History: [1:Received Chemotherapy, Received Radiation] [4:Received Chemotherapy, Received Radiation] [N/A:N/A] Date Acquired: [1:05/10/2014] [4:07/13/2015] [N/A:N/A] Weeks of Treatment: [1:70] [4:18] [N/A:N/A] Wound Status: [1:Open] [4:Open] [N/A:N/A] Measurements L x W x D 9.5x14x0.2 [4:2x3x0.1] [N/A:N/A] (cm) Area (cm) : [1:104.458] [4:4.712] [N/A:N/A] Volume (cm) :  [1:20.892] [4:0.471] [N/A:N/A] % Reduction in Area: [1:-343.30%] [4:-361.50%] [N/A:N/A] % Reduction in Volume: -343.40% [4:-130.90%] [N/A:N/A] Classification: [1:Full Thickness Without Exposed Support Structures] [4:Full Thickness Without Exposed Support Structures] [N/A:N/A] Exudate Amount: [1:Large] [4:Medium] [N/A:N/A] Exudate Type: [1:Serosanguineous] [4:Serosanguineous] [N/A:N/A] Exudate Color: [1:red, brown] [4:red, brown] [N/A:N/A] Wound Margin: [1:Fibrotic scar, thickened scar] [4:Distinct, outline attached] [N/A:N/A] Granulation Amount: [1:Small (1-33%)] [4:Medium (34-66%)] [N/A:N/A] Granulation Quality: [1:Pink, Pale] [4:N/A] [N/A:N/A] Necrotic Amount: [1:Large (67-100%)] [4:Small (1-33%)] [N/A:N/A]  Necrotic Tissue: [1:Eschar, Adherent Slough] [4:Adherent Slough] [N/A:N/A] Exposed Structures: [1:Fascia: No Fat: No Tendon: No] [4:Fascia: No Fat: No Tendon: No] [N/A:N/A] Muscle: No Muscle: No Joint: No Joint: No Bone: No Bone: No Limited to Skin Limited to Skin Breakdown Breakdown Epithelialization: None None N/A Periwound Skin Texture: Induration: Yes Scarring: Yes N/A Scarring: Yes Edema: No Edema: No Excoriation: No Excoriation: No Induration: No Callus: No Callus: No Crepitus: No Crepitus: No Fluctuance: No Fluctuance: No Friable: No Friable: No Rash: No Rash: No Periwound Skin No Abnormalities Noted Moist: Yes N/A Moisture: Maceration: No Dry/Scaly: No Periwound Skin Color: No Abnormalities Noted Atrophie Blanche: No N/A Cyanosis: No Ecchymosis: No Erythema: No Hemosiderin Staining: No Mottled: No Pallor: No Rubor: No Temperature: No Abnormality No Abnormality N/A Tenderness on Yes Yes N/A Palpation: Wound Preparation: Ulcer Cleansing: Ulcer Cleansing: N/A Rinsed/Irrigated with Rinsed/Irrigated with Saline Saline Topical Anesthetic Topical Anesthetic Applied: None Applied: None Treatment Notes Electronic Signature(s) Signed: 11/18/2015  4:40:39 PM By: Regan Lemming BSN, RN Entered By: Regan Lemming on 11/18/2015 11:10:22 [name] [name] ([id number]) -------------------------------------------------------------------------------- [place] Details Patient Name: [name] [name] Date of Service: 11/18/2015 10:45 AM Medical Record Number: [id number] Patient Account Number: [id number] Date of Birth/Sex: 1950-07-21 (64 y.o. Female) Treating RN: Afful, RN, BSN, [employer] Primary Care Physician: PATIENT, NO Other Clinician: Referring Physician: Treating Physician/Extender: Frann Rider in Treatment: 58 Active Inactive Abuse / Safety / Falls / Self Care Management Nursing Diagnoses: Potential for falls Goals: Patient will remain injury free Date Initiated: 07/11/2014 Goal Status: Active Interventions: Assess fall risk on admission and as needed Notes: Malignancy/Atypical Etiology Nursing Diagnoses: Knowledge deficit related to disease process and management of atypical ulcer etiology Goals: Patient/caregiver will verbalize understanding of disease process and disease management of atypical ulcer etiology Date Initiated: 07/11/2014 Goal Status: Active Interventions: Provide education on atypical ulcer etiologies Notes: Nutrition Nursing Diagnoses: Potential for alteratiion in Nutrition/Potential for imbalanced nutrition Goals: VIVIAN, SWARTZENTRUBER ([id number]) Patient/caregiver agrees to and verbalizes understanding of need to use nutritional supplements and/or vitamins as prescribed Date Initiated: 07/11/2014 Goal Status: Active Interventions: Assess patient nutrition upon admission and as needed per policy Notes: Orientation to the Wound Care Program Nursing Diagnoses: Knowledge deficit related to the wound healing center program Goals: Patient/caregiver will verbalize understanding of the [place] Program Date Initiated: 07/11/2014 Goal Status: Active Interventions: Provide  education on orientation to the wound center Notes: Wound/Skin Impairment Nursing Diagnoses: Impaired tissue integrity Goals: Patient will demonstrate a reduced rate of smoking or cessation of smoking Date Initiated: 07/11/2014 Goal Status: Active Patient will have a decrease in wound volume by X% from date: (specify in notes) Date Initiated: 07/11/2014 Goal Status: Active Patient/caregiver will verbalize understanding of skin care regimen Date Initiated: 07/11/2014 Goal Status: Active Ulcer/skin breakdown will have a volume reduction of 30% by week 4 Date Initiated: 07/11/2014 Goal Status: Active Ulcer/skin breakdown will have a volume reduction of 50% by week 8 Date Initiated: 07/11/2014 Goal Status: Active Ulcer/skin breakdown will have a volume reduction of 80% by week 12 [name] [name], [name] [name] ([id number]) Date Initiated: 07/11/2014 Goal Status: Active Ulcer/skin breakdown will heal within 14 weeks Date Initiated: 07/11/2014 Goal Status: Active Interventions: Assess patient/caregiver ability to obtain necessary supplies Assess patient/caregiver ability to perform ulcer/skin care regimen upon admission and as needed Assess ulceration(s) every visit Provide education on smoking Provide education on ulcer and skin care Screen for HBO Notes: Electronic Signature(s) Signed: 11/18/2015 4:40:39 PM By: Regan Lemming BSN, RN Entered By:  Regan Lemming on 11/18/2015 11:09:42 [name] [name], [name] [name] ([id number]) -------------------------------------------------------------------------------- Pain Assessment Details Patient Name: [name] [name], [name] [name] Date of Service: 11/18/2015 10:45 AM Medical Record Number: [id number] Patient Account Number: [id number] Date of Birth/Sex: 12/13/1950 (64 y.o. Female) Treating RN: Afful, RN, BSN, Velva Harman Primary Care Physician: PATIENT, NO Other Clinician: Referring Physician: Treating Physician/Extender: Frann Rider in Treatment: 39 Active Problems Location of Pain  Severity and Description of Pain Patient Has Paino No Site Locations With Dressing Change: No Pain Management and Medication Current Pain Management: Electronic Signature(s) Signed: 11/18/2015 4:40:39 PM By: Regan Lemming BSN, RN Entered By: Regan Lemming on 11/18/2015 10:55:16 [name] [name] ([id number]) -------------------------------------------------------------------------------- Patient/Caregiver Education Details Patient Name: [name] [name] Date of Service: 11/18/2015 10:45 AM Medical Record Number: [id number] Patient Account Number: [id number] Date of Birth/Gender: Dec 21, 1950 (64 y.o. Female) Treating RN: Afful, RN, BSN, Velva Harman Primary Care Physician: PATIENT, NO Other Clinician: Referring Physician: Treating Physician/Extender: Frann Rider in Treatment: 66 Education Assessment Education Provided To: Patient Education Topics Provided Malignant/Atypical Wounds: Methods: Explain/Verbal Responses: State content correctly Smoking and Wound Healing: Methods: Explain/Verbal Responses: State content correctly Welcome To [facility]: Methods: Explain/Verbal Responses: State content correctly Wound/Skin Impairment: Methods: Explain/Verbal Responses: State content correctly Electronic Signature(s) Signed: 11/18/2015 4:40:39 PM By: Regan Lemming BSN, RN Entered By: Regan Lemming on 11/18/2015 11:39:42 [name] [name] ([id number]) -------------------------------------------------------------------------------- Wound Assessment Details Patient Name: [name] [name] Date of Service: 11/18/2015 10:45 AM Medical Record Number: [id number] Patient Account Number: [id number] Date of Birth/Sex: 1951/01/11 (64 y.o. Female) Treating RN: Afful, RN, BSN, Administrator, sports Primary Care Physician: PATIENT, NO Other Clinician: Referring Physician: Treating Physician/Extender: Frann Rider in Treatment: 70 Wound Status Wound Number: 1 Primary Malignant Wound Etiology: Wound  Location: Left Chest Wound Status: Open Wounding Event: Other Lesion Comorbid Received Chemotherapy, Received Date Acquired: 05/10/2014 History: Radiation Weeks Of Treatment: 70 Clustered Wound: No Photos Photo Uploaded By: Regan Lemming on 11/18/2015 16:35:58 Wound Measurements Length: (cm) 9.5 Width: (cm) 14 Depth: (cm) 0.2 Area: (cm) 104.458 Volume: (cm) 20.892 % Reduction in Area: -343.3% % Reduction in Volume: -343.4% Epithelialization: None Tunneling: No Undermining: No Wound Description Full Thickness Without Exposed Classification: Support Structures Wound Margin: Fibrotic scar, thickened scar Exudate Large Amount: Exudate Type: Serosanguineous Exudate Color: red, brown Foul Odor After Cleansing: No Wound Bed Granulation Amount: Small (1-33%) Exposed Structure Granulation Quality: Pink, Pale Fascia Exposed: No Necrotic Amount: Large (67-100%) Fat Layer Exposed: No [name] [name], [name] Necrotic Quality: Eschar, Adherent Slough Tendon Exposed: No Muscle Exposed: No Joint Exposed: No Bone Exposed: No Limited to Skin Breakdown Periwound Skin Texture Texture Color No Abnormalities Noted: No No Abnormalities Noted: Yes Callus: No Temperature / Pain Crepitus: No Temperature: No Abnormality Excoriation: No Tenderness on Palpation: Yes Fluctuance: No Friable: No Induration: Yes Localized Edema: No Rash: No Scarring: Yes Moisture No Abnormalities Noted: Yes Wound Preparation Ulcer Cleansing: Rinsed/Irrigated with Saline Topical Anesthetic Applied: None Treatment Notes Wound #1 (Left Chest) 1. Cleansed with: Clean wound with Normal Saline 4. Dressing Applied: Aquacel Ag 5. Secondary Dressing Applied ABD Pad Electronic Signature(s) Signed: 11/18/2015 4:40:39 PM By: Regan Lemming BSN, RN Entered By: Regan Lemming on 11/18/2015 11:08:59 [name] [name]  ([id number]) -------------------------------------------------------------------------------- Wound Assessment Details Patient Name: [name] [name] Date of Service: 11/18/2015 10:45 AM Medical Record Number: [id number] Patient Account Number: [id number] Date of Birth/Sex: [birth year] (64 y.o. Female) Treating RN: Afful, RN, BSN, [employer] Primary Care Physician: PATIENT, NO Other Clinician: Referring Physician: Treating Physician/Extender: Frann Rider  in Treatment: 70 Wound Status Wound Number: 4 Primary Malignant Wound Etiology: Wound Location: Left Chest - Distal Wound Status: Open Wounding Event: Radiation Burn Comorbid Received Chemotherapy, Received Date Acquired: 07/13/2015 History: Radiation Weeks Of Treatment: 18 Clustered Wound: No Photos Photo Uploaded By: Regan Lemming on 11/18/2015 16:35:59 Wound Measurements Length: (cm) 2 Width: (cm) 3 Depth: (cm) 0.1 Area: (cm) 4.712 Volume: (cm) 0.471 % Reduction in Area: -361.5% % Reduction in Volume: -130.9% Epithelialization: None Tunneling: No Undermining: No Wound Description Full Thickness Without Exposed Classification: Support Structures Wound Margin: Distinct, outline attached Exudate Medium Amount: Exudate Type: Serosanguineous Exudate Color: red, brown Foul Odor After Cleansing: No Wound Bed Granulation Amount: Medium (34-66%) Exposed Structure Necrotic Amount: Small (1-33%) Fascia Exposed: No Necrotic Quality: Adherent Slough Fat Layer Exposed: No [name] [name], [name] Tendon Exposed: No Muscle Exposed: No Joint Exposed: No Bone Exposed: No Limited to Skin Breakdown Periwound Skin Texture Texture Color No Abnormalities Noted: No No Abnormalities Noted: No Callus: No Atrophie Blanche: No Crepitus: No Cyanosis: No Excoriation: No Ecchymosis: No Fluctuance: No Erythema: No Friable: No Hemosiderin Staining: No Induration: No Mottled: No Localized Edema: No Pallor: No Rash:  No Rubor: No Scarring: Yes Temperature / Pain Moisture Temperature: No Abnormality No Abnormalities Noted: No Tenderness on Palpation: Yes Dry / Scaly: No Maceration: No Moist: Yes Wound Preparation Ulcer Cleansing: Rinsed/Irrigated with Saline Topical Anesthetic Applied: None Treatment Notes Wound #4 (Left, Distal Chest) 1. Cleansed with: Clean wound with Normal Saline 4. Dressing Applied: Aquacel Ag 5. Secondary Dressing Applied ABD Pad Electronic Signature(s) Signed: 11/18/2015 4:40:39 PM By: Regan Lemming BSN, RN Entered By: Regan Lemming on 11/18/2015 11:09:27 [name] [name] ([id number]) -------------------------------------------------------------------------------- Vitals Details Patient Name: [name] [name] Date of Service: 11/18/2015 10:45 AM Medical Record Number: [id number] Patient Account Number: [id number] Date of Birth/Sex: [birth year] (64 y.o. Female) Treating RN: Afful, RN, BSN, Administrator, sports Primary Care Physician: PATIENT, NO Other Clinician: Referring Physician: Treating Physician/Extender: Frann Rider in Treatment: 70 Vital Signs Time Taken: 10:55 Temperature (F): 98.1 Height (in): 68 Pulse (bpm): 113 Weight (lbs): 180.5 Respiratory Rate (breaths/min): 18 Body Mass Index (BMI): 27.4 Blood Pressure (mmHg): 114/74 Reference Range: 80 - 120 mg / dl Electronic Signature(s) Signed: 11/18/2015 4:40:39 PM By: Regan Lemming BSN, RN Entered By: Regan Lemming on 11/18/2015 10:55:53

## 2015-11-18 NOTE — Telephone Encounter (Signed)
Pt here and wanted to let me know that she had not taken afinitor since last Friday due to her heart was racing started on Friday.  She got reall sob on exertion and she just went and laid down and rested and the the fast heart beat sent away.  She has never had that before that she can remember.  She started feeling better each day but did not taken afinitor.  Spoke to [place] and she wanted ehr to have ECHO.  On her last ct scan she had a small pericardial effusion and pericardial lymph node enlargement.  I spoke with pt and she is agreeable to have ECHO and it is sch. For this Thursday.  Also pt wanted to know if these results would change anything for her coming this thursday for treatment and md does feel like it would and she would be evaluated that day either way so she should keep her appt and I did tell pt this and to hold afinitor until she sees [place] on Thursday-she will probably not be on it all.

## 2015-11-18 NOTE — Progress Notes (Signed)
MONAI, HINDES ([id number]) Visit Report for 11/18/2015 Chief Complaint Document Details Patient Name: [name] [name], [name] [name] Date of Service: 11/18/2015 10:45 AM Medical Record Patient Account Number: [id number] Number: Afful, RN, BSN, Treating RN: 1950/07/01 [phone] y.o. [name] [name] Date of Birth/Sex: Female) Other Clinician: Primary Care Physician: PATIENT, NO Treating Kinslee Dalpe Referring Physician: Physician/Extender: Suella Grove in Treatment: 39 Information Obtained from: Patient Chief Complaint Patient presents to the wound care center for a consult due non healing wound patient is a self-referral who comes with a history of having open wounds to her left chest wall and right chest wall for about 2 months. 07/18/2014 -- since last week the patient has had no complaints and is on her last cycle of chemotherapy. I have reviewed 155 pages of her reports from a medical oncologist and the summary is made in the history of present illness. Electronic Signature(s) Signed: 11/18/2015 11:29:37 AM By: Christin Fudge MD, FACS Entered By: Christin Fudge on 11/18/2015 11:29:37 [name] ([id number]) -------------------------------------------------------------------------------- HPI Details Patient Name: [name] Date of Service: 11/18/2015 10:45 AM Medical Record Patient Account Number: [id number] Number: Afful, RN, BSN, Treating RN: 1951-03-28 [phone] y.o. [name] [name] Date of Birth/Sex: Female) Other Clinician: Primary Care Physician: PATIENT, NO Treating Blue Winther Referring Physician: Physician/Extender: Suella Grove in Treatment: 62 History of Present Illness HPI Description: this 65 year old patient has come by herself today as a self-referral and has no documentation with her. She has had open wounds to her left chest and the right chest wall for about 2 months. She is not a diabetic and has no significant medical history except breast cancer. Her right breast cancer was  diagnosed 18 years ago and she had a mastectomy and chemotherapy. 3 years ago she was then diagnosed with a breast cancer of the left side which was advanced and could not be operated and was given chemotherapy for a year and then followed by radiation. Her last radiation was over a year ago. She has not had any biopsy done recently from the ulcerated area but she says there have been other biopsies done from the chest wall and these reports are not available at the present time. She was told to keep the wounds open and let them dry out but she is finding it's more and more difficult to stop soiling her clothes if she leaves wounds open. She is on chemotherapy and we will try and obtain these notes from her oncologist. She is not in pain and does not have any significant problems except the discomfort from an open wound on the chest wall. 07/18/2014 This is a summary of [name] [name] date of birth 06-Aug-1950. These are obtained by reviewing 151 pages of medical records which were obtained from the [facility] 65 year old patient who had a history of stage I right breast cancer status post modified radical mastectomy on 06/12/1996. 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 She received 4 cycles of AC at [facility]. In January 2014 she presented with left breast swelling and apparently cellulitis. Breast biopsy done revealed lobular carcinoma which was ER and PR positive and HER-2/neu negative. On PET scan and she was shown to have multiple areas of disease left breast, axilla and supraclavicular areas. She received 6 cycles of chemotherapy and then received Femara because she was not a surgical candidate. PET scan also revealed disease in the left lateral breast and new cervical lymph nodes at level II. The patient was  then sent for radiation therapy and received this over a month. In January 2016 she had a PET CT scan done which  was compatible with marked progression of disease with multifocal soft tissue masses in the chest wall left greater than right. She is currently receiving Xeloda and is noted to have disease in the chest wall which is widely present. Her most recent lab work from 07/02/2014 shows that her CMP is within normal limits with a serum albumin of 4.4. The previous CBC was within normal limits with a WBC count of 4.3 hemoglobin of 14 hematocrit of 40.9 and platelets of 202. 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 We will continue with palliative care and make her comfortable as much as possible as far as her wound care goes. Present time there is no odor but if this does occur we would use carbon- based products to mask the odor. [name] [name] ([id number]) Notes were also reviewed from her surgeon Dr. Hervey Ard --closure on 05/27/2014 for breast biopsy in view of the fact that she had extensive disease on her chest wall. Biopsies are taken from the nodular metastatic disease overlying the sternum and nodules chosen for biopsy. Punch biopsies were taken and the pathology on this confirmed that this was consistent with carcinoma 08/15/2014 -- she is started on a new course of injectable chemotherapy and this is 2 weeks on and 2 weeks off. She has no new symptoms no bleeding from the wounds nor is there any odor. She is doing fine otherwise. 09/16/2014. She is doing very well and the right side wounds have completely healed. She has finished 2 cycles of chemotherapy and she is 2 weeks on and 2 weeks off. He is to restart her chemotherapy this week. No fresh complaints pain is within control and there is no odor from the wounds. 10/21/2014 -- overall she is doing very well and is on a clinical  trial with her medical oncologist. No issues with the wound on her left side and there is no odor or drainage. 11/25/2014 -- she continues to be under clinical trial protocol with the medical oncologist and has no fresh issues. 02/17/2015 -- she has been taken off the clinical trial protocol and now is on Taxol on a regular basis with her medical oncologists Dr. Susy Manor. 05/20/15; the patient follow see her for a nonhealing area on the left lateral chest. 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 She has been using Aquacel Ag on this area on a palliative basis. No debridement has been done. 06/17/15; the patient follows in this clinic monthly for a nonhealing area on her left lateral chest. She has known metastatic breast cancer which is metastatic to the chest wall. She returns today with a cluster of open areas on the lateral aspect of her chest. The nurses in the clinic described this as a large open area that progressively closed over. I've taken this opportunity to review her oncology status. On her arrival here she asked me to debridement the wounds on her chest stating that this was a request of [name] her medical oncologist. This is a patient who initially developed stage I right breast cancer treated with a modified radical mastectomy in 1998. She  really presented with inflammatory breast cancer in 2014 post chemotherapy biopsy was positive she was not a surgical candidate. Since then she has been treated with multiple regimens. Most particular to her wounds PET scan in January 2016 revealed marked progression of the disease with multifocal masses in the chest wall left greater than right. 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 Left chest wall biopsy on 04/29/15 revealed metastatic  carcinoma consistent with invasive lobular carcinoma. She has been receiving Faslodex. Weekly. She missed her last injection and is attributing this to the breakdown of tissue on her left chest wall. She was seen by [name] in early March however the record is not complete 07/16/15; patient arrives for her monthly visit the. I've reviewed her oncologist notes Dr. Nolon Stalls. Her notes for the increasing nodularity in the chest wall look. Her pain seems adequately controlled with tramadol. The measurements of the necrotic areas seem to be increasing. They are covered with a thick surface slough however I don't believe the debridement is indicated here. She is dressing knees with Aquacel Ag and foam 08/12/15; this is a patient I follow monthly. Her primary oncologist is [name] of the local oncology clinic. The patient lives in [place]. Unfortunately she has expanding necrotic mass in her left anterior chest which is no doubt malignant wounds from her known metastatic breast cancer. She also has a lot of tenderness on the lateral aspect of this wound which might be cellulitis that she appears to have some form of drainage. I have done a culture although this also could be from expanding necrotic tumor 08/19/15; the patient is here for review of culture reports. At the far left area of her necrotic wound was purulent drainage last week. This grew both Citrobacter Koseri and MRSA. The patient does not feel Rumery, Iliyah W. ([id number]) systemically unwell although she is still having pain in the area. No fever. She is now on Ibrance for her metastatic breast cancer 08/26/15; patient is just about completed her antibiotics. There is less drainage but still some tenderness on the lateral aspect of her necrotic chest wall wound. She has not been systemically unwell 09/23/15 there is extension of the necrotic malignant ulcer across her left anterior chest. Surrounding tissue is probably also  involved including extensively medially from the wound and also laterally into the axilla. There is no evidence of infection. The patient has an appointment with General surgery tomorrow apparently arranged by her oncologist. 10/21/15: pt reports cancerous lesion progressing. reports scan today. awaiting results. she denies pain or discomfort associated with her wound. 11/18/2015 -- seen this patient for a long while but she comes back with a rather large fungating lesion of her left anterior chest wall Electronic Signature(s) Signed: 11/18/2015 11:30:45 AM By: Christin Fudge MD, FACS Entered By: Christin Fudge on 11/18/2015 11:30:42 [name] ([id number]) -------------------------------------------------------------------------------- Physical Exam Details Patient Name: [name] Date of Service: 11/18/2015 10:45 AM Medical Record Patient Account Number: [id number] Number: Afful, RN, BSN, Treating RN: [birth year] (65 y.o. [name] [name] Date of Birth/Sex: Female) Other Clinician: Primary Care Physician: PATIENT, NO Treating Eyla Tallon Referring Physician: Physician/Extender: Suella Grove in Treatment: 70 Constitutional . Pulse regular. Respirations normal and unlabored. Afebrile. . Eyes Nonicteric. Reactive to light. Ears, Nose, Mouth, and Throat Lips, teeth, and gums WNL.Marland Kitchen Moist mucosa without lesions. Neck supple and nontender. No palpable supraclavicular or  cervical adenopathy. Normal sized without goiter. Respiratory WNL. No retractions.. Breath sounds WNL, No rubs, rales, rhonchi, or wheeze.. Cardiovascular Heart rhythm and rate regular, no murmur or gallop.. Pedal Pulses WNL. No clubbing, cyanosis or edema. Lymphatic No adneopathy. No adenopathy. No adenopathy. Musculoskeletal Adexa without tenderness or enlargement.. Digits and nails w/o clubbing, cyanosis, infection, petechiae, ischemia, or inflammatory conditions.. Integumentary (Hair, Skin) No suspicious  lesions. No crepitus or fluctuance. No peri-wound warmth or erythema. No masses.Marland Kitchen Psychiatric Judgement and insight Intact.. No evidence of depression, anxiety, or agitation.. Notes the left anterior chest wall has a large fungating ulcer which is covered with slough and it is fungating down to bone Electronic Signature(s) Signed: 11/18/2015 11:31:35 AM By: Christin Fudge MD, FACS Entered By: Christin Fudge on 11/18/2015 11:31:35 [name] ([id number]) -------------------------------------------------------------------------------- Physician Orders Details Patient Name: [name] Date of Service: 11/18/2015 10:45 AM Medical Record Patient Account Number: [id number] Number: Afful, RN, BSN, Treating RN: [birth year] (65 y.o. [name] [name] Date of Birth/Sex: Female) Other Clinician: Primary Care Physician: PATIENT, NO Treating Ahmari Garton Referring Physician: Physician/Extender: Suella Grove in Treatment: 16 Verbal / Phone Orders: Yes Clinician: Afful, RN, BSN, Rita Read Back and Verified: Yes Diagnosis Coding Wound Cleansing Wound #1 Left Chest o Cleanse wound with mild soap and water o May Shower, gently pat wound dry prior to applying new dressing. o May shower with protection. Wound #4 Left,Distal Chest o Cleanse wound with mild soap and water o May Shower, gently pat wound dry prior to applying new dressing. o May shower with protection. Primary Wound Dressing Wound #1 Left Chest o Aquacel Ag Wound #4 Left,Distal Chest o Aquacel Ag Secondary Dressing Wound #1 Left Chest o ABD pad Wound #4 Left,Distal Chest o ABD pad Dressing Change Frequency Wound #1 Left Chest o Change dressing every other day. - or more if needed Wound #4 Left,Distal Chest o Change dressing every other day. - or more if needed Follow-up Appointments Wound #1 Left Chest DESTYNI, HOPPEL. ([id number]) o Return Appointment in 1 month Wound #4 Left,Distal Chest o  Return Appointment in 1 month Additional Orders / Instructions Wound #1 Left Chest o Increase protein intake. o Activity as tolerated Wound #4 Left,Distal Chest o Increase protein intake. o Activity as tolerated Electronic Signature(s) Signed: 11/18/2015 11:58:06 AM By: Christin Fudge MD, FACS Signed: 11/18/2015 4:40:39 PM By: Regan Lemming BSN, RN Entered By: Regan Lemming on 11/18/2015 11:18:05 [name] ([id number]) -------------------------------------------------------------------------------- Problem List Details Patient Name: [name] Date of Service: 11/18/2015 10:45 AM Medical Record Patient Account Number: [id number] Number: Afful, RN, BSN, Treating RN: 01-04-1951 (65 y.o. [name] [name] Date of Birth/Sex: Female) Other Clinician: Primary Care Physician: PATIENT, NO Treating Celie Desrochers Referring Physician: Physician/Extender: Suella Grove in Treatment: 6 Active Problems ICD-10 Encounter Code Description Active Date Diagnosis C50.812 Malignant neoplasm of overlapping sites of left female 07/11/2014 Yes breast S21.001A Unspecified open wound of right breast, initial encounter 07/11/2014 Yes C50.412 Malignant neoplasm of upper-outer quadrant of left female 07/11/2014 Yes breast S21.002A Unspecified open wound of left breast, initial encounter 07/11/2014 Yes Inactive Problems Resolved Problems Electronic Signature(s) Signed: 11/18/2015 11:29:29 AM By: Christin Fudge MD, FACS Entered By: Christin Fudge on 11/18/2015 11:29:29 [name] ([id number]) -------------------------------------------------------------------------------- Progress Note Details Patient Name: [name] Date of Service: 11/18/2015 10:45 AM Medical Record Patient Account Number: [id number] Number: Afful, RN, BSN, Treating RN: 26-Mar-1951 (65 y.o. 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 07/18/2014 -- since last week the patient has had no complaints and is on her last cycle of chemotherapy. I have reviewed 155 pages of her reports from a medical oncologist and the summary is made in the history of present illness. History of Present Illness (HPI) this 65 year old patient has come by herself today as a self-referral and has no documentation with her. She has had open wounds to her left chest and the right chest wall for about 2 months. She is not a diabetic and has no significant medical history except breast cancer. Her right breast cancer was diagnosed 18 years ago and she had a mastectomy and chemotherapy. 3 years ago she was then diagnosed with a breast cancer of the left side which was advanced and could not be operated and was given chemotherapy for a year and then followed by radiation. Her last radiation was over a year ago. She has not had any biopsy done recently from the ulcerated area but she says there have been other biopsies done from the chest wall and these reports are not available at the present time. She was told to keep the wounds open and let them dry out but she is finding it's more and more difficult to stop soiling her clothes if she leaves wounds open. She is on chemotherapy and we will try and obtain these notes from her oncologist. She is not in pain and does not have any significant problems except the discomfort from an open wound on the chest wall. 07/18/2014 This is a summary of Yasheka Fossett date of birth April 27, 1950. These are obtained by reviewing 151 pages of medical records which were obtained from the [facility] 65 year old patient who had a history of stage I right breast cancer status post modified radical mastectomy on 06/12/1996. 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 She received 4 cycles of AC at [facility]. In January 2014 she presented with left breast swelling and apparently cellulitis. Breast biopsy done revealed lobular carcinoma which was ER and PR positive and HER-2/neu negative. On PET scan and she was shown to have multiple areas of disease left breast, axilla and supraclavicular areas. She received 6 cycles of chemotherapy and then received Femara because she was not a surgical candidate. PET scan also revealed disease in the left lateral breast and new cervical lymph nodes at level II. The patient was then sent for radiation therapy and received this over a month. In January 2016 she had a PET CT scan done which was compatible with marked progression of disease with multifocal soft tissue masses in the chest wall left greater than right. FAIGA, STONES. ([id number]) She is currently receiving Xeloda and is noted to have disease in the chest wall which is widely present. Her most recent lab work from 07/02/2014 shows that her CMP is within normal limits with a serum albumin of 4.4. The previous CBC was within normal limits with a WBC count of 4.3 hemoglobin of 14 hematocrit of 40.9 and platelets of 202. After reading the reports as above I believe that this patient has extensive stage IV breast carcinoma which has now  infiltrated into the skin of the chest wall and has fungating tumors coming through in several places especially on the left. We will continue with palliative care and make her comfortable as much as possible as far as her wound care goes. Present time there is no odor but if this does occur we would use carbon- based products to mask the odor. Notes were also reviewed from her surgeon Dr. Hervey Ard --closure on 05/27/2014 for breast biopsy in view of the fact that she had extensive disease on her chest wall. Biopsies are taken from the nodular metastatic disease overlying the sternum and nodules chosen for biopsy. Punch biopsies were taken and the pathology on this confirmed that this was consistent with carcinoma 08/15/2014 -- she is started on a new course of injectable chemotherapy and this is 2 weeks on and 2 weeks off. She has no new symptoms no bleeding from the wounds nor is there any odor. She is doing fine otherwise. 09/16/2014. She is doing very well and the right side wounds have completely healed. She has finished 2 cycles of chemotherapy and she is 2 weeks on and 2 weeks off. He is to restart her chemotherapy this week. No fresh complaints pain is within control and there is no odor from the wounds. 10/21/2014 -- overall she is doing very well and is on a clinical trial with her medical oncologist. No issues with the wound on her left side and there is no odor or drainage. 11/25/2014 -- she continues to be under clinical trial protocol with the medical oncologist and has no fresh issues. 02/17/2015 -- she has been taken off the clinical trial protocol and now is on Taxol on a regular basis with her medical oncologists Dr. Susy Manor. 05/20/15; the patient follow see her for a nonhealing area on the left lateral chest. 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 She has been using Aquacel Ag on this area on a palliative basis. No debridement has been done. 06/17/15; the patient follows in this clinic monthly for a nonhealing area on her left lateral chest. She has known metastatic breast cancer which is metastatic to the chest wall. She returns today with a cluster of open areas on the lateral aspect of her chest. The nurses in the clinic described this as a large open area that progressively closed over. I've  taken this opportunity to review her oncology status. On her arrival here she asked me to debridement the wounds on her chest stating that this was a request of [name] her medical oncologist. This is a patient who initially developed stage I right breast cancer treated with a modified radical mastectomy in 1998. She really presented with inflammatory breast cancer in 2014 post chemotherapy biopsy was positive she was not a surgical candidate. Since then she has been treated with multiple regimens. Most particular to her wounds PET scan in January 2016 revealed marked progression of the disease with multifocal masses in the chest wall left greater than right. 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 She has been receiving Faslodex. Weekly. She missed her last injection and is attributing this to the breakdown of tissue on her left chest wall. She was seen by [name] in early  March however the record is not complete 07/16/15; patient arrives for her monthly visit the. I've reviewed her oncologist notes Dr. Nolon Stalls. Her notes for the increasing nodularity in the chest wall look. Her pain seems adequately controlled with tramadol. The measurements of the necrotic areas seem to be increasing. They are covered with a thick Lesure, Nidia W. ([id number]) surface slough however I don't believe the debridement is indicated here. She is dressing knees with Aquacel Ag and foam 08/12/15; this is a patient I follow monthly. Her primary oncologist is [name] of the local oncology clinic. The patient lives in [place]. Unfortunately she has expanding necrotic mass in her left anterior chest which is no doubt malignant wounds from her known  metastatic breast cancer. She also has a lot of tenderness on the lateral aspect of this wound which might be cellulitis that she appears to have some form of drainage. I have done a culture although this also could be from expanding necrotic tumor 08/19/15; the patient is here for review of culture reports. At the far left area of her necrotic wound was purulent drainage last week. This grew both Citrobacter Koseri and MRSA. The patient does not feel systemically unwell although she is still having pain in the area. No fever. She is now on Ibrance for her metastatic breast cancer 08/26/15; patient is just about completed her antibiotics. There is less drainage but still some tenderness on the lateral aspect of her necrotic chest wall wound. She has not been systemically unwell 09/23/15 there is extension of the necrotic malignant ulcer across her left anterior chest. Surrounding tissue is probably also involved including extensively medially from the wound and also laterally into the axilla. There is no evidence of infection. The patient has an appointment with General surgery tomorrow apparently arranged by her oncologist. 10/21/15: pt reports cancerous lesion progressing. reports scan today. awaiting results. she denies pain or discomfort associated with her wound. 11/18/2015 -- seen this patient for a long while but she comes back with a rather large fungating lesion of her left anterior chest wall Objective Constitutional Pulse regular. Respirations normal and unlabored. Afebrile. Vitals Time Taken: 10:55 AM, Height: 68 in, Weight: 180.5 lbs, BMI: 27.4, Temperature: 98.1 F, Pulse: 113 bpm, Respiratory Rate: 18 breaths/min, Blood Pressure: 114/74 mmHg. Eyes Nonicteric. Reactive to light. Ears, Nose, Mouth, and Throat Lips, teeth, and gums WNL.Marland Kitchen Moist mucosa without lesions. Neck supple and nontender. No palpable supraclavicular or cervical adenopathy. Normal sized without  goiter. Respiratory WNL. No retractions.. Breath sounds WNL, No rubs, rales, rhonchi, or wheeze.. Cardiovascular Swager, Desire W. ([id number]) Heart rhythm and rate regular, no murmur or gallop.. Pedal Pulses WNL. No clubbing, cyanosis or edema. Lymphatic No adneopathy. No adenopathy. No adenopathy. Musculoskeletal Adexa without tenderness or enlargement.. Digits and nails w/o clubbing, cyanosis, infection, petechiae, ischemia, or inflammatory conditions.Marland Kitchen Psychiatric Judgement and insight Intact.. No evidence of depression, anxiety, or agitation.. General Notes: the left anterior chest wall has a large fungating ulcer which is covered with slough and it is fungating down to bone Integumentary (Hair, Skin) No suspicious lesions. No crepitus or fluctuance. No peri-wound warmth or erythema. No masses.. Wound #1 status is Open. Original cause of wound was Other Lesion. The wound is located on the Left Chest. The wound measures 9.5cm length x 14cm width x 0.2cm depth; 104.458cm^2 area and 20.892cm^3 volume. The wound is limited to skin breakdown. There is no tunneling or undermining noted. There is a large amount of serosanguineous drainage noted.  The wound margin is fibrotic, thickened scar. There is small (1-33%) pink, pale granulation within the wound bed. There is a large (67-100%) amount of necrotic tissue within the wound bed including Eschar and Adherent Slough. The periwound skin appearance had no abnormalities noted for moisture. The periwound skin appearance had no abnormalities noted for color. The periwound skin appearance exhibited: Induration, Scarring. The periwound skin appearance did not exhibit: Callus, Crepitus, Excoriation, Fluctuance, Friable, Localized Edema, Rash. Periwound temperature was noted as No Abnormality. The periwound has tenderness on palpation. Wound #4 status is Open. Original cause of wound was Radiation Burn. The wound is located on the Left,Distal Chest.  The wound measures 2cm length x 3cm width x 0.1cm depth; 4.712cm^2 area and 0.471cm^3 volume. The wound is limited to skin breakdown. There is no tunneling or undermining noted. There is a medium amount of serosanguineous drainage noted. The wound margin is distinct with the outline attached to the wound base. There is medium (34-66%) granulation within the wound bed. There is a small (1-33%) amount of necrotic tissue within the wound bed including Adherent Slough. The periwound skin appearance exhibited: Scarring, Moist. The periwound skin appearance did not exhibit: Callus, Crepitus, Excoriation, Fluctuance, Friable, Induration, Localized Edema, Rash, Dry/Scaly, Maceration, Atrophie Blanche, Cyanosis, Ecchymosis, Hemosiderin Staining, Mottled, Pallor, Rubor, Erythema. Periwound temperature was noted as No Abnormality. The periwound has tenderness on palpation. Assessment Active Problems ICD-10 C50.812 - Malignant neoplasm of overlapping sites of left female breast WYNTER, ISAACS. ([id number]) S21.001A - Unspecified open wound of right breast, initial encounter C50.412 - Malignant neoplasm of upper-outer quadrant of left female breast S21.002A - Unspecified open wound of left breast, initial encounter I have recommended we cover it with Aquacel Ag and carbo flex with a large ABD pad and tape. She does say that sometimes the Carboflex makes the odor worse and hence I will leave this to her to decide. She will return for regular refilling of her supplies and note is being made of the fact that she is for palliative care Plan Wound Cleansing: Wound #1 Left Chest: Cleanse wound with mild soap and water May Shower, gently pat wound dry prior to applying new dressing. May shower with protection. Wound #4 Left,Distal Chest: Cleanse wound with mild soap and water May Shower, gently pat wound dry prior to applying new dressing. May shower with protection. Primary Wound Dressing: Wound #1  Left Chest: Aquacel Ag Wound #4 Left,Distal Chest: Aquacel Ag Secondary Dressing: Wound #1 Left Chest: ABD pad Wound #4 Left,Distal Chest: ABD pad Dressing Change Frequency: Wound #1 Left Chest: Change dressing every other day. - or more if needed Wound #4 Left,Distal Chest: Change dressing every other day. - or more if needed Follow-up Appointments: Wound #1 Left Chest: Return Appointment in 1 month Wound #4 Left,Distal Chest: Return Appointment in 1 month Additional Orders / Instructions: REYANN, TROOP ([id number]) Wound #1 Left Chest: Increase protein intake. Activity as tolerated Wound #4 Left,Distal Chest: Increase protein intake. Activity as tolerated I have recommended we cover it with Aquacel Ag and carbo flex with a large ABD pad and tape. She does say that sometimes the Carboflex makes the odor worse and hence I will leave this to her to decide. She will return for regular refilling of her supplies and note is being made of the fact that she is for palliative care Electronic Signature(s) Signed: 11/18/2015 11:33:25 AM By: Christin Fudge MD, FACS Entered By: Christin Fudge on 11/18/2015 11:33:24 [name] ([id number]) --------------------------------------------------------------------------------  SuperBill Details Patient Name: ASTI, MACKLEY Date of Service: 11/18/2015 Medical Record Patient Account Number: [id number] Number: Afful, RN, BSN, Treating RN: 1950/11/17 [phone] 65 y.o. [name] [name] Date of Birth/Sex: Female) Other Clinician: Primary Care Physician: PATIENT, NO Treating Jaiden Wahab Referring Physician: Physician/Extender: Suella [place] in Treatment: 70 Diagnosis Coding ICD-10 Codes Code Description C50.812 Malignant neoplasm of overlapping sites of left female breast S21.001A Unspecified open wound of right breast, initial encounter C50.412 Malignant neoplasm of upper-outer quadrant of left female breast S21.002A Unspecified open wound of  left breast, initial encounter Facility Procedures CPT4 Code: 41290475 Description: 99213 - WOUND CARE VISIT-LEV 3 EST PT Modifier: Quantity: 1 Physician Procedures CPT4 Code Description: [id number] [place] - WC PHYS LEVEL 3 - EST PT ICD-10 Description Diagnosis C50.812 Malignant neoplasm of overlapping sites of left S21.001A Unspecified open wound of right breast, initial C50.412 Malignant neoplasm of upper-outer  quadrant of le S21.002A Unspecified open wound of left breast, initial e Modifier: female breast encounter ft female breast ncounter Quantity: 1 Electronic Signature(s) Signed: 11/18/2015 11:33:50 AM By: Christin Fudge MD, FACS Entered By: Christin Fudge on 11/18/2015 11:33:49

## 2015-11-20 ENCOUNTER — Inpatient Hospital Stay: Payer: Medicare Other

## 2015-11-20 ENCOUNTER — Ambulatory Visit
Admission: RE | Admit: 2015-11-20 | Discharge: 2015-11-20 | Disposition: A | Discharge status: Home / Self Care | Payer: Medicare Other | Source: Ambulatory Visit | Attending: Hematology and Oncology | Admitting: Hematology and Oncology

## 2015-11-20 ENCOUNTER — Other Ambulatory Visit: Payer: Self-pay | Admitting: Hematology and Oncology

## 2015-11-20 ENCOUNTER — Inpatient Hospital Stay (HOSPITAL_BASED_OUTPATIENT_CLINIC_OR_DEPARTMENT_OTHER): Payer: Medicare Other | Admitting: Hematology and Oncology

## 2015-11-20 VITALS — BP 115/79 | HR 101 | Temp 97.7°F | Resp 18 | Wt 152.1 lb

## 2015-11-20 DIAGNOSIS — E079 Disorder of thyroid, unspecified: Secondary | ICD-10-CM | POA: Insufficient documentation

## 2015-11-20 DIAGNOSIS — Z171 Estrogen receptor negative status [ER-]: Secondary | ICD-10-CM | POA: Diagnosis not present

## 2015-11-20 DIAGNOSIS — G893 Neoplasm related pain (acute) (chronic): Secondary | ICD-10-CM

## 2015-11-20 DIAGNOSIS — Z79899 Other long term (current) drug therapy: Secondary | ICD-10-CM

## 2015-11-20 DIAGNOSIS — I517 Cardiomegaly: Secondary | ICD-10-CM | POA: Insufficient documentation

## 2015-11-20 DIAGNOSIS — C50912 Malignant neoplasm of unspecified site of left female breast: Secondary | ICD-10-CM

## 2015-11-20 DIAGNOSIS — C792 Secondary malignant neoplasm of skin: Secondary | ICD-10-CM | POA: Diagnosis not present

## 2015-11-20 DIAGNOSIS — F17210 Nicotine dependence, cigarettes, uncomplicated: Secondary | ICD-10-CM | POA: Diagnosis not present

## 2015-11-20 DIAGNOSIS — I313 Pericardial effusion (noninflammatory): Secondary | ICD-10-CM

## 2015-11-20 DIAGNOSIS — G629 Polyneuropathy, unspecified: Secondary | ICD-10-CM

## 2015-11-20 DIAGNOSIS — K219 Gastro-esophageal reflux disease without esophagitis: Secondary | ICD-10-CM | POA: Diagnosis not present

## 2015-11-20 DIAGNOSIS — N189 Chronic kidney disease, unspecified: Secondary | ICD-10-CM | POA: Insufficient documentation

## 2015-11-20 DIAGNOSIS — R011 Cardiac murmur, unspecified: Secondary | ICD-10-CM | POA: Insufficient documentation

## 2015-11-20 DIAGNOSIS — C50112 Malignant neoplasm of central portion of left female breast: Secondary | ICD-10-CM

## 2015-11-20 DIAGNOSIS — I3139 Other pericardial effusion (noninflammatory): Secondary | ICD-10-CM

## 2015-11-20 DIAGNOSIS — Z9011 Acquired absence of right breast and nipple: Secondary | ICD-10-CM

## 2015-11-20 DIAGNOSIS — I319 Disease of pericardium, unspecified: Secondary | ICD-10-CM | POA: Insufficient documentation

## 2015-11-20 DIAGNOSIS — Z09 Encounter for follow-up examination after completed treatment for conditions other than malignant neoplasm: Secondary | ICD-10-CM | POA: Diagnosis present

## 2015-11-20 DIAGNOSIS — I252 Old myocardial infarction: Secondary | ICD-10-CM | POA: Insufficient documentation

## 2015-11-20 DIAGNOSIS — Z9221 Personal history of antineoplastic chemotherapy: Secondary | ICD-10-CM

## 2015-11-20 DIAGNOSIS — Z853 Personal history of malignant neoplasm of breast: Secondary | ICD-10-CM

## 2015-11-20 LAB — COMPREHENSIVE METABOLIC PANEL
ALT: 16 U/L (ref 14–54)
AST: 27 U/L (ref 15–41)
Albumin: 3.2 g/dL — ABNORMAL LOW (ref 3.5–5.0)
Alkaline Phosphatase: 84 U/L (ref 38–126)
Anion gap: 11 (ref 5–15)
BUN: 8 mg/dL (ref 6–20)
CO2: 24 mmol/L (ref 22–32)
Calcium: 9.1 mg/dL (ref 8.9–10.3)
Chloride: 102 mmol/L (ref 101–111)
Creatinine, Ser: 0.78 mg/dL (ref 0.44–1.00)
GFR calc Af Amer: >60 mL/min (ref >60)
GFR calc non Af Amer: >60 mL/min (ref >60)
Glucose, Bld: 127 mg/dL — ABNORMAL HIGH (ref 65–99)
Potassium: 3.4 mmol/L — ABNORMAL LOW (ref 3.5–5.1)
Sodium: 137 mmol/L (ref 135–145)
Total Bilirubin: 0.3 mg/dL (ref 0.3–1.2)
Total Protein: 7.5 g/dL (ref 6.5–8.1)

## 2015-11-20 LAB — CBC WITH DIFFERENTIAL/PLATELET
Basophils Absolute: 0.1 10*3/uL (ref 0–0.1)
Basophils Relative: 1 %
Eosinophils Absolute: 0 10*3/uL (ref 0–0.7)
Eosinophils Relative: 0 %
HCT: 32.7 % — ABNORMAL LOW (ref 35.0–47.0)
Hemoglobin: 11.1 g/dL — ABNORMAL LOW (ref 12.0–16.0)
Lymphocytes Relative: 22 %
Lymphs Abs: 1.8 10*3/uL (ref 1.0–3.6)
MCH: 30.4 pg (ref 26.0–34.0)
MCHC: 33.9 g/dL (ref 32.0–36.0)
MCV: 89.6 fL (ref 80.0–100.0)
Monocytes Absolute: 1.2 10*3/uL — ABNORMAL HIGH (ref 0.2–0.9)
Monocytes Relative: 15 %
Neutro Abs: 5 10*3/uL (ref 1.4–6.5)
Neutrophils Relative %: 62 %
Platelets: 298 10*3/uL (ref 150–440)
RBC: 3.64 MIL/uL — ABNORMAL LOW (ref 3.80–5.20)
RDW: 19 % — ABNORMAL HIGH (ref 11.5–14.5)
WBC: 8.1 10*3/uL (ref 3.6–11.0)

## 2015-11-20 MED ORDER — HEPARIN SOD (PORK) LOCK FLUSH 100 UNIT/ML IV SOLN
500.0000 [IU] | Freq: Once | INTRAVENOUS | Status: AC | PRN
  Administered 2015-11-20: 500 [IU]

## 2015-11-20 MED ORDER — SODIUM CHLORIDE 0.9 % IV SOLN
Freq: Once | INTRAVENOUS | Status: AC
  Administered 2015-11-20: 16:00:00 via INTRAVENOUS
  Filled 2015-11-20: qty 1000

## 2015-11-20 MED ORDER — SODIUM CHLORIDE 0.9 % IV SOLN
200.0000 mg | Freq: Once | INTRAVENOUS | Status: DC
  Filled 2015-11-20: qty 8

## 2015-11-20 MED ORDER — HEPARIN SOD (PORK) LOCK FLUSH 100 UNIT/ML IV SOLN
INTRAVENOUS | Status: AC
  Filled 2015-11-20: qty 5

## 2015-11-20 NOTE — Progress Notes (Signed)
[facility] day:  11/20/2015  Chief Complaint: [name] [name] is a 65 y.o. female with recurrent breast cancer who is seen for assessment prior to [facility].  HPI: The patient was last seen in the medical oncology clinic on 11/11/2015.  At that time, she was on Aromasin and Afinitor.  She continued her Afinitor until 11/14/2015.  She spoke to the nurse about her heart racing and shortness of breath.  After resting, her tachycardia resolved.  Given her last CT scan, decision was made to pursue echocardiogram.  She underwent echo on 11/20/2015.  EF was 50-55%.  Pericardium was not well visualized.  There was no pericardial effusion  Symptomatically, she states that she feels better this week.  She is using more comfortable dressings for her left chest wall disease.   Past Medical History:  Diagnosis Date  . Anemia   . Anxiety   . Blood transfusion without reported diagnosis   . Emphysema of lung ([place])   . History of left breast cancer , known active 2013  . Personal history of malignant neoplasm of breast     Past Surgical History:  Procedure Laterality Date  . ABDOMINAL HYSTERECTOMY  1995  . BREAST BIOPSY Left 2014  . BREAST BIOPSY Left 01-21-14  . BREAST SURGERY Right 1996   mastectomy  . mastectomy Right 1997  . NEPHRECTOMY    . oophrectomy  1980    Family History  Problem Relation Age of Onset  . Cancer Father   . Heart disease Sister   . Diabetes Sister   . Heart disease Brother   . Cancer Paternal Grandmother     breast    Social History:  reports that she has been smoking Cigarettes.  She has a 6.25 pack-year smoking history. She has never used smokeless tobacco. She reports that she does not drink alcohol or use drugs.  She is smoking 1/2 pack per day.  She is alone today.  Allergies:  Allergies  Allergen Reactions  . Sulfa Antibiotics Nausea And Vomiting and Other (See Comments)    headache    Current  Medications: Current Outpatient Prescriptions  Medication Sig Dispense Refill  . Calcium Carb-Cholecalciferol (CALCIUM 600 + D PO) Take 1 tablet by mouth 2 (two) times daily.    Marland Kitchen KLOR-CON 10 10 MEQ tablet TAKE 1 TABLET (10 MEQ TOTAL) BY MOUTH DAILY. 30 tablet 1  . LORazepam (ATIVAN) 0.5 MG tablet Take 0.5 mg by mouth daily as needed for anxiety.   0  . ondansetron (ZOFRAN) 8 MG tablet Take 1 tablet (8 mg total) by mouth every 8 (eight) hours as needed for nausea or vomiting. (Patient not taking: Reported on 12/11/2015) 20 tablet 1  . feeding supplement, ENSURE ENLIVE, (ENSURE ENLIVE) LIQD Take 237 mLs by mouth 2 (two) times daily between meals. 237 mL 12  . megestrol (MEGACE) 400 MG/10ML suspension Take 5 mLs (200 mg total) by mouth daily. 150 mL 1  . traMADol (ULTRAM) 50 MG tablet 1 - 2 tablets (50 - 100 mg) every 6 hours as needed for pain (Patient taking differently: Take 50-100 mg by mouth every 6 (six) hours as needed for moderate pain. ) 90 tablet 0   No current facility-administered medications for this visit.    Facility-Administered Medications Ordered in Other Visits  Medication Dose Route Frequency Provider Last Rate Last Dose  . sodium chloride 0.9 % injection 10 mL  10 mL Intravenous PRN Lequita Asal,  MD   10 mL at 10/08/14 1335  . sodium chloride 0.9 % injection 10 mL  10 mL Intracatheter PRN Lequita Asal, MD   10 mL at 11/26/14 1355    Review of Systems:  GENERAL:  Feels "better".  No fevers or sweats.  Weight stable. PERFORMANCE STATUS (ECOG):  1 HEENT:  No visual changes, runny nose, sore throat, mouth sores or tenderness. Lungs: Mild shortness of breath with exertion.  No cough.  No hemoptysis. Cardiac:  No chest pain, palpitations, orthopnea, or PND. GI:  Appetite fair.  Constipation x 1 week, resolved.  No nausea, vomiting, diarrhea, melena or hematochezia. GU:  No urgency, frequency, dysuria, or hematuria. Musculoskeletal:  No back pain.  No joint pain.  No  muscle tenderness.  Interval muscle cramps. Extremities:  No pain or swelling. Skin:  Hair thin.  Chest wall lesions enlarging.  Bleeding chest wall lesion.  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  No headache, numbness or weakness, or coordination issues. Endocrine:  No diabetes, thyroid issues, hot flashes or night sweats. Psych:  Emotional issues secondary to family.  Pain:  Chest wall lesion pain controlled with increased dose of Tramadol. Review of systems:  All other systems reviewed and found to be negative.   Physical Exam: Blood pressure 115/79, pulse (!) 101, temperature 97.7 F (36.5 C), temperature source Tympanic, resp. rate 18, weight 152 lb 1.9 oz (69 kg).  GENERAL: Well developed, well nourished, woman sitting in the exam room in no acute distress. MENTAL STATUS: Alert and oriented to person, place and time. HEAD: Short gray wig. Normocephalic, atraumatic, face symmetric, no Cushingoid features. EYES: Brown eyes. Pupils equal round and reactive to light and accomodation. No conjunctivitis or scleral icterus. ENT: Oropharynx clear without lesion. Tongue normal. Mucous membranes moist.  NECK: Left neck cyst (stable). BREAST: Dressing covering left anterior chest wall (removed).  Bleeding/oozing when dressing removed from few areas  Coalesced deep necrotic open lesions extending 12 cm x 9 cm. Extensive disease beyond open areas.  Multiple nodules on upper abdominal wall. SKIN: Cutaneous breast cancer noted in breast section. No other lesions.  EXTREMITIES: No edema, no skin discoloration or tenderness. No palpable cords. LYMPH NODES: Left axillary ridge (thick). No palpable cervical, supraclavicular, or inguinal adenopathy  NEUROLOGICAL: Unremarkable.  PSYCH: Appropriate.   Appointment on 11/20/2015  Component Date Value Ref Range Status  . WBC 11/20/2015 8.1  3.6 - 11.0 K/uL Final  . RBC 11/20/2015 3.64* 3.80 - 5.20 MIL/uL Final  . Hemoglobin  11/20/2015 11.1* 12.0 - 16.0 g/dL Final  . HCT 11/20/2015 32.7* 35.0 - 47.0 % Final  . MCV 11/20/2015 89.6  80.0 - 100.0 fL Final  . MCH 11/20/2015 30.4  26.0 - 34.0 pg Final  . MCHC 11/20/2015 33.9  32.0 - 36.0 g/dL Final  . RDW 11/20/2015 19.0* 11.5 - 14.5 % Final  . Platelets 11/20/2015 298  150 - 440 K/uL Final  . Neutrophils Relative % 11/20/2015 62  % Final  . Neutro Abs 11/20/2015 5.0  1.4 - 6.5 K/uL Final  . Lymphocytes Relative 11/20/2015 22  % Final  . Lymphs Abs 11/20/2015 1.8  1.0 - 3.6 K/uL Final  . Monocytes Relative 11/20/2015 15  % Final  . Monocytes Absolute 11/20/2015 1.2* 0.2 - 0.9 K/uL Final  . Eosinophils Relative 11/20/2015 0  % Final  . Eosinophils Absolute 11/20/2015 0.0  0 - 0.7 K/uL Final  . Basophils Relative 11/20/2015 1  % Final  .  Basophils Absolute 11/20/2015 0.1  0 - 0.1 K/uL Final  . Sodium 11/20/2015 137  135 - 145 mmol/L Final  . Potassium 11/20/2015 3.4* 3.5 - 5.1 mmol/L Final  . Chloride 11/20/2015 102  101 - 111 mmol/L Final  . CO2 11/20/2015 24  22 - 32 mmol/L Final  . Glucose, Bld 11/20/2015 127* 65 - 99 mg/dL Final  . BUN 11/20/2015 8  6 - 20 mg/dL Final  . Creatinine, Ser 11/20/2015 0.78  0.44 - 1.00 mg/dL Final  . Calcium 11/20/2015 9.1  8.9 - 10.3 mg/dL Final  . Total Protein 11/20/2015 7.5  6.5 - 8.1 g/dL Final  . Albumin 11/20/2015 3.2* 3.5 - 5.0 g/dL Final  . AST 11/20/2015 27  15 - 41 U/L Final  . ALT 11/20/2015 16  14 - 54 U/L Final  . Alkaline Phosphatase 11/20/2015 84  38 - 126 U/L Final  . Total Bilirubin 11/20/2015 0.3  0.3 - 1.2 mg/dL Final  . GFR calc non Af Amer 11/20/2015 >60  >60 mL/min Final  . GFR calc Af Amer 11/20/2015 >60  >60 mL/min Final   Comment: (NOTE) The eGFR has been calculated using the CKD EPI equation. This calculation has not been validated in all clinical situations. eGFR's persistently <60 mL/min signify possible Chronic Kidney Disease.   . Anion gap 11/20/2015 11  5 - 15 Final  . CA 27.29 11/21/2015  1381.8* 0.0 - 38.6 U/mL Final   Comment: (NOTE) Specimen was diluted in order to obtain results. Results were repeated. Research scientist (life sciences) Performed At: [facility] [address] [place], [place] [id number] Lindon Romp MD MW:[phone]     Assessment:  [name] [name] is a 65 y.o. African American woman with metastatic breast cancer.  She has a history of stage I right breast cancer s/p modified radical mastectomy on 06/12/1996. Pathology revealed a grade III mutifocal infiltrating ductal carcinoma (tumor size 8.5 cm with an invasive component 1.6 cm). Twenty-one lymph nodes were negative. Tumor was ER/PR negative. She received 4 cycles of AC at [facility].  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 PET scan on 05/22/2012 revealed multiple areas of disease. She received 6 cycles of Taxotere and Cytoxan (TC) from 06/06/2012 - 09/29/2012. PET scan on 09/07/2012 noted marked improvement. Post chemotherapy biopsy was positive. She was not a surgical candidate. She was placed on Femara.  PET scan on 05/03/2013 revealed a new focus along lateral left breast and a new level II cervical lymph node. She received radiation from 09/13/2013 - 11/05/2013. PET scan on 01/01/2014 revealed new nodule medial left chest wall pleural/paraspinal activity LUL. Multiple biopsies on 01/19/2014 were positive. She began daily letrozole and Ibrance on 02/12/2014. Despite treatment, new nodules formed. She received a total of 2 cycles (delays in therapy).   PET scan on 05/09/2014 revealed marked progression of disease with multifocal masses in chest wall (left > right). She did not receive Imbrance in 04/2014. CA27.29 has increased: 22.9 on 01/08/2014, 54 on 03/05/2014, 170.8 on 05/06/2014, 250.8 on 05/23/2014, 445.8 on 06/14/2014, and 531.1 on 07/02/2014.   She received  3 cycles of Xeloda (02/12 - 07/05/2014). Chest and abdomen CT scan on 07/05/2014 revealed persistent and slightly progressive diffuse locally invasive chest wall tumor. Bone scan on 07/25/2014 revealed no evidence of metastatic disease.   She received 8 cycles of [place] (08/06/2014 - 01/07/2015) on ACCRU [id number] I. 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  Chest, abdomen, and pelvic CT scan on 10/24/2014 revealed improved multifocal inflammatory left breast cancer.  The dominant 2.6 x 3.5 cm lesion in the left upper outer left breast had decreased to 1.7 x 3.5 cm. The anterior sternal lesion has decreased from 2 x 4 cm to 1.5 x 3.9 cm. There were no suspicious mediastinal, hilar or axillary adenopathy.    Chest, abdomen, and pelvic CT scan on 01/20/2015 revealed inflammatory left breast cancer with new necrotic appearing subcutaneous nodules in the lateral left breast and lateral left chest wall.   CA27.29 was 339.8 on 08/06/2014, 51.7 on 10/15/2014, 37.6 on 12/10/2014, 26.4 on 01/07/2015, 34.1 on 01/28/2015, and 49.3 on 03/10/2015,  258.6 on 06/12/2015, 421.3 on 07/10/2015, 726.3 on 07/25/2015, 911 on 08/14/2015, 1031 on 09/17/2015, and 1242 on 10/17/2015.  She received 2 cycles of Taxol given 3 weeks on/1 week off (02/10/2015 - 03/10/2015).  Chest and abdomen CT scan on 04/03/2015 revealed interval growth of superficial tumors in the lateral left breast and lateral left chest wall.  In the left lateral chest, disease had increased from 2.3 x 2 to 2.9 x 2.2 cm.  In the superficial lateral left chest wall, disease had increased from 1.7 x 1.2 cm to 2.7 x 1.8 cm.  There were stable findings of locally infiltrative tumor in the left chest wall and left axilla, including extensive left chest wall skin thickening, infiltrative soft tissue encasing the left axillary artery and focal pleural thickening in the anterior left mid pleural space.  There was no metastatic disease in the abdomen.  Left  chest wall biopsy on 04/29/2015 revealed metastatic carcinoma consistent with invasive lobular carcinoma.  Estrogen receptor was > 90%, progesterone receptor was > 90%, and Her2/neu was 1+.    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 Treatment options of Faslodex (ESR1) and everolimus (approved) and temsirolimus (approved therapy for other tumor types).  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  Mutational burden was TMB-intermediate; 12 Muts/Mb.  She received Faslodex from 05/08/2015 - 09/19/2015. She received 3 cycles of Ibrance (07/26/2015 - 09/19/2015).  Chest, abdomen, and pelvic CT scans on 07/09/2015 revealed interval progression of disease in the left anterior and lateral chest wall.  There was interval development of nodular pleural thickening in the posterior medial left hemi thorax with some focal fluid in the left major fissure. Findings raised concern for interval development of pleural involvement by tumor.  There was no evidence for metastatic disease in the abdomen   Chest, abdomen, and pelvic CT scan on 10/20/2015 revealed progression of subcarinal lymph node (now 1.1 cm).  There was the interval development of trace pericardial effusion with some pericardial nodularity along the left heart border.  There was progression of fissural fluid in the left hemi thorax with increase in left pleural nodularity.  There was progression of left antral lateral chest wall disease with invasion of the left pectoralis major muscle.  Subcutaneous metastatic nodules had progressed.  She received 1 cycle of Aromasin and everolimus (began 10/21/2015 and 10/29/2015).  Disease progressed.  Symptomatically, she has significant chest wall nodularity with deep open necrotic areas.  She has  nodules across her abdominal wall. Pain is controlled with increased dose of Tramadol.   Plan: 1.  Labs today:  CBC with diff, CMP, CA27.29.  2.  Review prior consultation at [facility].  Discuss Keytruda.  Unclear if disease will respond to therapy.  Side effects reviewed in detail.  Patient consented to treatment. 3.  Cycle #1 Keytruda today 4.  RTC in 3 weeks for MD assess, labs (CBC with diff, CMP, CA27.29), and cycle #2 Keytruda.   Lequita Asal, MD  11/20/2015

## 2015-11-20 NOTE — Progress Notes (Signed)
*  PRELIMINARY RESULTS* Echocardiogram 2D Echocardiogram has been performed.  [name] [name] 11/20/2015, 10:21 AM

## 2015-11-20 NOTE — Progress Notes (Signed)
Patient is here for follow up, little chest pain where she has cancer

## 2015-11-21 ENCOUNTER — Other Ambulatory Visit: Payer: Self-pay | Admitting: *Deleted

## 2015-11-21 LAB — CANCER ANTIGEN 27.29: CA 27.29: 1381.8 U/mL — ABNORMAL HIGH (ref 0.0–38.6)

## 2015-11-21 MED ORDER — TRAMADOL HCL 50 MG PO TABS: ORAL_TABLET | ORAL | 0 refills | Status: DC

## 2015-12-07 ENCOUNTER — Encounter: Payer: Self-pay | Admitting: Emergency Medicine

## 2015-12-07 ENCOUNTER — Inpatient Hospital Stay
Admission: EM | Admit: 2015-12-07 | Discharge: 2015-12-10 | DRG: 186 | Disposition: A | Discharge status: Home / Self Care | Payer: Medicare Other | Attending: Internal Medicine | Admitting: Internal Medicine

## 2015-12-07 ENCOUNTER — Emergency Department: Payer: Medicare Other

## 2015-12-07 DIAGNOSIS — I472 Ventricular tachycardia: Secondary | ICD-10-CM | POA: Diagnosis present

## 2015-12-07 DIAGNOSIS — J910 Malignant pleural effusion: Secondary | ICD-10-CM

## 2015-12-07 DIAGNOSIS — Z6823 Body mass index (BMI) 23.0-23.9, adult: Secondary | ICD-10-CM | POA: Diagnosis not present

## 2015-12-07 DIAGNOSIS — I5022 Chronic systolic (congestive) heart failure: Secondary | ICD-10-CM | POA: Diagnosis present

## 2015-12-07 DIAGNOSIS — Z66 Do not resuscitate: Secondary | ICD-10-CM | POA: Diagnosis present

## 2015-12-07 DIAGNOSIS — I3139 Other pericardial effusion (noninflammatory): Secondary | ICD-10-CM | POA: Diagnosis present

## 2015-12-07 DIAGNOSIS — Z923 Personal history of irradiation: Secondary | ICD-10-CM

## 2015-12-07 DIAGNOSIS — F17210 Nicotine dependence, cigarettes, uncomplicated: Secondary | ICD-10-CM | POA: Diagnosis present

## 2015-12-07 DIAGNOSIS — Z882 Allergy status to sulfonamides status: Secondary | ICD-10-CM

## 2015-12-07 DIAGNOSIS — C50919 Malignant neoplasm of unspecified site of unspecified female breast: Secondary | ICD-10-CM | POA: Diagnosis present

## 2015-12-07 DIAGNOSIS — Z8249 Family history of ischemic heart disease and other diseases of the circulatory system: Secondary | ICD-10-CM | POA: Diagnosis not present

## 2015-12-07 DIAGNOSIS — Z9221 Personal history of antineoplastic chemotherapy: Secondary | ICD-10-CM | POA: Diagnosis not present

## 2015-12-07 DIAGNOSIS — J449 Chronic obstructive pulmonary disease, unspecified: Secondary | ICD-10-CM | POA: Diagnosis present

## 2015-12-07 DIAGNOSIS — Z9011 Acquired absence of right breast and nipple: Secondary | ICD-10-CM

## 2015-12-07 DIAGNOSIS — Z833 Family history of diabetes mellitus: Secondary | ICD-10-CM | POA: Diagnosis not present

## 2015-12-07 DIAGNOSIS — Z90710 Acquired absence of both cervix and uterus: Secondary | ICD-10-CM | POA: Diagnosis not present

## 2015-12-07 DIAGNOSIS — Z170 Estrogen receptor positive status [ER+]: Secondary | ICD-10-CM | POA: Diagnosis not present

## 2015-12-07 DIAGNOSIS — R0600 Dyspnea, unspecified: Secondary | ICD-10-CM

## 2015-12-07 DIAGNOSIS — I319 Disease of pericardium, unspecified: Secondary | ICD-10-CM | POA: Diagnosis not present

## 2015-12-07 DIAGNOSIS — J9601 Acute respiratory failure with hypoxia: Secondary | ICD-10-CM | POA: Diagnosis present

## 2015-12-07 DIAGNOSIS — Z905 Acquired absence of kidney: Secondary | ICD-10-CM | POA: Diagnosis not present

## 2015-12-07 DIAGNOSIS — E43 Unspecified severe protein-calorie malnutrition: Secondary | ICD-10-CM | POA: Diagnosis present

## 2015-12-07 DIAGNOSIS — Z79899 Other long term (current) drug therapy: Secondary | ICD-10-CM | POA: Diagnosis not present

## 2015-12-07 DIAGNOSIS — F419 Anxiety disorder, unspecified: Secondary | ICD-10-CM | POA: Diagnosis present

## 2015-12-07 DIAGNOSIS — J90 Pleural effusion, not elsewhere classified: Principal | ICD-10-CM | POA: Diagnosis present

## 2015-12-07 DIAGNOSIS — I313 Pericardial effusion (noninflammatory): Secondary | ICD-10-CM | POA: Diagnosis present

## 2015-12-07 DIAGNOSIS — Z9889 Other specified postprocedural states: Secondary | ICD-10-CM

## 2015-12-07 DIAGNOSIS — I519 Heart disease, unspecified: Secondary | ICD-10-CM

## 2015-12-07 DIAGNOSIS — C50912 Malignant neoplasm of unspecified site of left female breast: Secondary | ICD-10-CM | POA: Diagnosis not present

## 2015-12-07 DIAGNOSIS — Z809 Family history of malignant neoplasm, unspecified: Secondary | ICD-10-CM

## 2015-12-07 DIAGNOSIS — R0602 Shortness of breath: Secondary | ICD-10-CM

## 2015-12-07 HISTORY — DX: Anxiety disorder, unspecified: F41.9

## 2015-12-07 LAB — CBC WITH DIFFERENTIAL/PLATELET
Basophils Absolute: 0.1 10*3/uL (ref 0–0.1)
Basophils Relative: 1 %
EOS ABS: 0 10*3/uL (ref 0–0.7)
Eosinophils Relative: 1 %
HEMATOCRIT: 39.4 % (ref 35.0–47.0)
HEMOGLOBIN: 13.1 g/dL (ref 12.0–16.0)
LYMPHS ABS: 1.5 10*3/uL (ref 1.0–3.6)
Lymphocytes Relative: 17 %
MCH: 30.3 pg (ref 26.0–34.0)
MCHC: 33.2 g/dL (ref 32.0–36.0)
MCV: 91.2 fL (ref 80.0–100.0)
MONOS PCT: 11 %
Monocytes Absolute: 1 10*3/uL — ABNORMAL HIGH (ref 0.2–0.9)
NEUTROS ABS: 6.1 10*3/uL (ref 1.4–6.5)
NEUTROS PCT: 70 %
Platelets: 292 10*3/uL (ref 150–440)
RBC: 4.32 MIL/uL (ref 3.80–5.20)
RDW: 18.4 % — ABNORMAL HIGH (ref 11.5–14.5)
WBC: 8.6 10*3/uL (ref 3.6–11.0)

## 2015-12-07 LAB — BASIC METABOLIC PANEL
Anion gap: 9 (ref 5–15)
BUN: 11 mg/dL (ref 6–20)
CHLORIDE: 100 mmol/L — AB (ref 101–111)
CO2: 26 mmol/L (ref 22–32)
CREATININE: 0.76 mg/dL (ref 0.44–1.00)
Calcium: 9.7 mg/dL (ref 8.9–10.3)
GFR calc non Af Amer: >60 mL/min (ref >60)
Glucose, Bld: 103 mg/dL — ABNORMAL HIGH (ref 65–99)
POTASSIUM: 4.2 mmol/L (ref 3.5–5.1)
SODIUM: 135 mmol/L (ref 135–145)

## 2015-12-07 LAB — TROPONIN I

## 2015-12-07 MED ORDER — SODIUM CHLORIDE 0.9% FLUSH
3.0000 mL | Freq: Two times a day (BID) | INTRAVENOUS | Status: DC
  Administered 2015-12-08 – 2015-12-10 (×5): 3 mL via INTRAVENOUS

## 2015-12-07 MED ORDER — IOPAMIDOL (ISOVUE-370) INJECTION 76%
75.0000 mL | Freq: Once | INTRAVENOUS | Status: AC | PRN
  Administered 2015-12-07: 60 mL via INTRAVENOUS

## 2015-12-07 MED ORDER — OXYCODONE HCL 5 MG PO TABS
5.0000 mg | ORAL_TABLET | ORAL | Status: DC | PRN
  Administered 2015-12-08 – 2015-12-10 (×8): 5 mg via ORAL
  Filled 2015-12-07 (×8): qty 1

## 2015-12-07 MED ORDER — ONDANSETRON HCL 4 MG/2ML IJ SOLN: 4.0000 mg | Freq: Four times a day (QID) | INTRAMUSCULAR | Status: DC | PRN

## 2015-12-07 MED ORDER — TRAMADOL HCL 50 MG PO TABS: 50.0000 mg | ORAL_TABLET | Freq: Four times a day (QID) | ORAL | Status: DC | PRN

## 2015-12-07 MED ORDER — SODIUM CHLORIDE 0.9 % IV BOLUS (SEPSIS)
1000.0000 mL | Freq: Once | INTRAVENOUS | Status: AC
  Administered 2015-12-07: 1000 mL via INTRAVENOUS

## 2015-12-07 MED ORDER — ONDANSETRON HCL 4 MG PO TABS: 4.0000 mg | ORAL_TABLET | Freq: Four times a day (QID) | ORAL | Status: DC | PRN

## 2015-12-07 MED ORDER — ACETAMINOPHEN 325 MG PO TABS: 650.0000 mg | ORAL_TABLET | Freq: Four times a day (QID) | ORAL | Status: DC | PRN

## 2015-12-07 MED ORDER — ENOXAPARIN SODIUM 40 MG/0.4ML ~~LOC~~ SOLN
40.0000 mg | SUBCUTANEOUS | Status: DC
  Administered 2015-12-08 – 2015-12-09 (×2): 40 mg via SUBCUTANEOUS
  Filled 2015-12-07 (×2): qty 0.4

## 2015-12-07 MED ORDER — IPRATROPIUM-ALBUTEROL 0.5-2.5 (3) MG/3ML IN SOLN
3.0000 mL | Freq: Once | RESPIRATORY_TRACT | Status: AC
  Administered 2015-12-07: 3 mL via RESPIRATORY_TRACT
  Filled 2015-12-07: qty 3

## 2015-12-07 MED ORDER — TRAMADOL HCL 50 MG PO TABS
50.0000 mg | ORAL_TABLET | Freq: Four times a day (QID) | ORAL | Status: DC | PRN
  Administered 2015-12-07 – 2015-12-08 (×2): 100 mg via ORAL
  Filled 2015-12-07 (×2): qty 2

## 2015-12-07 MED ORDER — IPRATROPIUM-ALBUTEROL 0.5-2.5 (3) MG/3ML IN SOLN: 3.0000 mL | RESPIRATORY_TRACT | Status: DC | PRN

## 2015-12-07 MED ORDER — ACETAMINOPHEN 650 MG RE SUPP: 650.0000 mg | Freq: Four times a day (QID) | RECTAL | Status: DC | PRN

## 2015-12-07 MED ORDER — LORAZEPAM 0.5 MG PO TABS
0.5000 mg | ORAL_TABLET | Freq: Every day | ORAL | Status: DC | PRN
  Administered 2015-12-08 – 2015-12-09 (×2): 0.5 mg via ORAL
  Filled 2015-12-07 (×2): qty 1

## 2015-12-07 NOTE — ED Triage Notes (Signed)
Feeling SOB.  Onset of symptoms x 3 days, symptoms worsening.

## 2015-12-07 NOTE — H&P (Signed)
[facility] [place] NAME: [name] [name]    MR#:  [id number]  DATE OF BIRTH:  1951/02/02  DATE OF ADMISSION:  12/07/2015  PRIMARY CARE PHYSICIAN: No PCP Per Patient   REQUESTING/REFERRING PHYSICIAN: Quentin Cornwall, MD  CHIEF COMPLAINT:   Chief Complaint  Patient presents with  . Shortness of Breath    HISTORY OF PRESENT ILLNESS:  [name] [name]  is a 65 y.o. female who presents with 3 days of progressive shortness of breath. Patient states that she has some baseline shortness of breath, but over the last 3 days has become difficult for her to walk even from her living room to her bathroom. Here in the ED she was found to have significant new pleural effusion, as well as an increase in the size of her pericardial effusion to moderate size. She has a baseline history of metastatic breast cancer. Hospitalists were called for admission and further treatment.  PAST MEDICAL HISTORY:   Past Medical History:  Diagnosis Date  . Anemia   . Anxiety   . Blood transfusion without reported diagnosis   . Emphysema of lung ([place])   . History of left breast cancer , known active 2013  . Personal history of malignant neoplasm of breast     PAST SURGICAL HISTORY:   Past Surgical History:  Procedure Laterality Date  . ABDOMINAL HYSTERECTOMY  1995  . BREAST BIOPSY Left 2014  . BREAST BIOPSY Left 01-21-14  . BREAST SURGERY Right 1996   mastectomy  . mastectomy Right 1997  . NEPHRECTOMY    . oophrectomy  1980    SOCIAL HISTORY:   Social History  Substance Use Topics  . Smoking status: Current Every Day Smoker    Packs/day: 0.25    Years: 25.00    Types: Cigarettes  . Smokeless tobacco: Never Used     Comment: 10/29/14-now decreased. smokes 1 pack per week  . Alcohol use No    FAMILY HISTORY:   Family History  Problem Relation Age of Onset  . Cancer Father   . Heart disease Sister   . Diabetes Sister   . Heart disease Brother   .  Cancer Paternal Grandmother     breast    DRUG ALLERGIES:   Allergies  Allergen Reactions  . Sulfa Antibiotics Nausea And Vomiting and Other (See Comments)    headache    MEDICATIONS AT HOME:   Prior to Admission medications   Medication Sig Start Date End Date Taking? Authorizing Provider  Calcium Carb-Cholecalciferol (CALCIUM 600 + D PO) Take 1 tablet by mouth 2 (two) times daily.    Historical Provider, MD  everolimus (AFINITOR) 10 MG tablet Take 1 tablet (10 mg total) by mouth daily. 10/21/15   Lequita Asal, MD  exemestane (AROMASIN) 25 MG tablet Take 1 tablet (25 mg total) by mouth daily after breakfast. 10/21/15   Lequita Asal, MD  KLOR-CON 10 10 MEQ tablet TAKE 1 TABLET (10 MEQ TOTAL) BY MOUTH DAILY. 05/25/15   Lequita Asal, MD  LORazepam (ATIVAN) 0.5 MG tablet Take 0.5 mg by mouth daily as needed for anxiety.  08/30/14   Historical Provider, MD  ondansetron (ZOFRAN) 8 MG tablet Take 1 tablet (8 mg total) by mouth every 8 (eight) hours as needed for nausea or vomiting. 10/21/15   Lequita Asal, MD  traMADol (ULTRAM) 50 MG tablet 1 - 2 tablets (50 - 100 mg) every 6 hours as needed for pain  11/21/15   Lequita Asal, MD    REVIEW OF SYSTEMS:  Review of Systems  Constitutional: Positive for malaise/fatigue. Negative for chills, fever and weight loss.  HENT: Negative for ear pain, hearing loss and tinnitus.   Eyes: Negative for blurred vision, double vision, pain and redness.  Respiratory: Positive for shortness of breath. Negative for cough and hemoptysis.   Cardiovascular: Negative for chest pain, palpitations, orthopnea and leg swelling.  Gastrointestinal: Negative for abdominal pain, constipation, diarrhea, nausea and vomiting.  Genitourinary: Negative for dysuria, frequency and hematuria.  Musculoskeletal: Negative for back pain, joint pain and neck pain.  Skin:       No acne, rash, or lesions  Neurological: Negative for dizziness, tremors, focal  weakness and weakness.  Endo/Heme/Allergies: Negative for polydipsia. Does not bruise/bleed easily.  Psychiatric/Behavioral: Negative for depression. The patient is not nervous/anxious and does not have insomnia.      VITAL SIGNS:   Vitals:   12/07/15 1404 12/07/15 1622 12/07/15 1800 12/07/15 2021  BP:  116/78 118/82 123/80  Pulse:  (!) 113 96 (!) 101  Resp:  (!) 22 16 18   Temp:      TempSrc:      SpO2:  95% 94% 97%  Weight: 68 kg (150 lb)     Height: 5\' 8"  (1.727 m)      Wt Readings from Last 3 Encounters:  12/07/15 68 kg (150 lb)  11/20/15 69 kg (152 lb 1.9 oz)  11/11/15 69.4 kg (153 lb)    PHYSICAL EXAMINATION:  Physical Exam  Vitals reviewed. Constitutional: She is oriented to person, place, and time. She appears well-developed and well-nourished. No distress.  HENT:  Head: Normocephalic and atraumatic.  Mouth/Throat: Oropharynx is clear and moist.  Eyes: Conjunctivae and EOM are normal. Pupils are equal, round, and reactive to light. No scleral icterus.  Neck: Normal range of motion. Neck supple. No JVD present. No thyromegaly present.  Cardiovascular: Regular rhythm and intact distal pulses.  Exam reveals no gallop and no friction rub.   No murmur heard. Tachycardic  Respiratory: She is in respiratory distress (mild, a shave is able to converse but has some difficulty finishing longer sentences). She has no wheezes. She has no rales.  Coarse crackles  GI: Soft. Bowel sounds are normal. She exhibits no distension. There is no tenderness.  Musculoskeletal: Normal range of motion. She exhibits no edema.  No arthritis, no gout  Lymphadenopathy:    She has no cervical adenopathy.  Neurological: She is alert and oriented to person, place, and time. No cranial nerve deficit.  No dysarthria, no aphasia  Skin: Skin is warm and dry. No rash noted. No erythema.  Psychiatric: She has a normal mood and affect. Her behavior is normal. Judgment and thought content normal.     LABORATORY PANEL:   CBC  Recent Labs Lab 12/07/15 1417  WBC 8.6  HGB 13.1  HCT 39.4  PLT 292   ------------------------------------------------------------------------------------------------------------------  Chemistries   Recent Labs Lab 12/07/15 1417  NA 135  K 4.2  CL 100*  CO2 26  GLUCOSE 103*  BUN 11  CREATININE 0.76  CALCIUM 9.7   ------------------------------------------------------------------------------------------------------------------  Cardiac Enzymes  Recent Labs Lab 12/07/15 1417  TROPONINI <0.03   ------------------------------------------------------------------------------------------------------------------  RADIOLOGY:  Dg Chest 2 View  Result Date: 12/07/2015 CLINICAL DATA:  RIGHT-sided mastectomy 20 years prior. LEFT breast cancer recurrence. EXAM: CHEST  2 VIEW COMPARISON:  CT 10/20/2015 FINDINGS: RIGHT port noted. Stable enlarged cardiac silhouette. Increased in  fullness along the LEFT suprahilar mediastinum measuring up to 5 cm. There is increase in intrafissural fluid in the LEFT lung along the oblique fissure. Bilateral small pleural effusions at the lung bases are also increased. No pulmonary edema. No focal infiltrate. IMPRESSION: 1. Increase in volume of loculated fluid along the LEFT oblique fissure. 2. Increased in basilar pleural effusions. 3. Increased fullness in the LEFT suprahilar mediastinum is concerning for loculated fluid versus pleural metastasis. Electronically Signed   By: Suzy Bouchard M.D.   On: 12/07/2015 14:42   Ct Angio Chest Pe W And/or Wo Contrast  Result Date: 12/07/2015 CLINICAL DATA:  65 y/o F; history of breast cancer with worsening shortness of breath. EXAM: CT ANGIOGRAPHY CHEST WITH CONTRAST TECHNIQUE: Multidetector CT imaging of the chest was performed using the standard protocol during bolus administration of intravenous contrast. Multiplanar CT image reconstructions and MIPs were obtained to evaluate  the vascular anatomy. CONTRAST:  75 cc Isovue 370. COMPARISON:  Chest x-ray 12/07/2015 and CT chest 10/20/2015. FINDINGS: Mediastinum/Lymph Nodes: No pulmonary embolus or thoracic aortic dissection is identified. Moderate pericardial effusion. Normal heart size. Lungs/Pleura: Small right and trace left pleural effusions. Loculated fluid tracking along the left major fissure. Moderate centrilobular emphysema with predominant upper lobe distribution. Minor dependent opacities within the lower lobes are probably atelectasis. Nodularity along the left aspect of the pericardium and left anterior costophrenic angle is partially obscured by pleural fluid and poorly enhancing due to contrast bolus timing. Upper abdomen: No acute findings. Musculoskeletal: There is a large wound on the left chest, extensive skin thickening and multiple foci infiltrative soft tissue consistent with metastatic disease extending inferiorly below the field of view in the left lateral abdominal wall, extending to the contralateral subcutaneous fat of the right thorax, extending along the sternum, invading the left pectoralis muscle, and invading the left trapezius muscle muscle in the left lateral chest wall. The degree of soft tissue infiltration has increased from the prior CT for example a large left lateral chest wall lesion measures 54 x 20 mm axially, previously 49 x 60 mm (series 5, image 69). Review of the MIP images confirms the above findings. IMPRESSION: 1. No pulmonary embolus or thoracic aortic dissection is identified. 2. Increase in pericardial effusion, now moderate. 3. Increased pleural fluid with small right and trace left effusions. Increased loculation along the left major fissure Minor associated dependent atelectasis in the lower lobes. 4. Increasing infiltrative neoplasm centered in the left anterolateral chest wall with extension into left pectoralis muscle, left trapezius muscle, left axilla with nodularity along the  anterior surface of the sternum, extending to the contralateral right chest wall and left upper abdomen wall. 5. Increased size of numerous skin nodules of anterior chest and abdomen possibly representing metastasis. 6. Possible increase in pleural metastasis along the left heart border and left anterior costophrenic sulcus. 7. Moderate emphysema. Electronically Signed   By: Kristine Garbe M.D.   On: 12/07/2015 19:01    EKG:   Orders placed or performed during the hospital encounter of 12/07/15  . ED EKG  . ED EKG  . ED EKG  . ED EKG    IMPRESSION AND PLAN:  Principal Problem:   Pleural effusion - small pleural effusion, though it may be contributing to her shortness of breath. We'll put in for ultrasound-guided thoracentesis tomorrow. She does not feel any signs or symptoms of infection both in history or on workup in the ED Active Problems:   Pericardial effusion - increased  in size to moderate size. We will get an echocardiogram tomorrow and a cardiology consult for recommendations on further management   Metastatic breast cancer [facility]) - continue chemotherapeutic agents, oncology consult   Anxiety - home dose anxiolytics  All the records are reviewed and case discussed with ED provider. Management plans discussed with the patient and/or family.  DVT PROPHYLAXIS: SubQ lovenox  GI PROPHYLAXIS: None  ADMISSION STATUS: Inpatient  CODE STATUS: DO NOT RESUSCITATE    Code Status Orders        Start     Ordered   12/07/15 [id number]  Do not attempt resuscitation/DNR  Continuous    Question Answer Comment  In the event of cardiac or respiratory ARREST Do not call a "code blue"   In the event of cardiac or respiratory ARREST Do not perform Intubation, CPR, defibrillation or ACLS   In the event of cardiac or respiratory ARREST Use medication by any route, position, wound care, and other measures to relive pain and suffering. May use oxygen, suction and manual treatment of airway  obstruction as needed for comfort.      12/07/15 1659    Code Status History    Date Active Date Inactive Code Status Order ID Comments User Context   This patient has a current code status but no historical code status.    DNR  TOTAL TIME TAKING CARE OF THIS PATIENT: 45 minutes.    Roni Friberg FIELDING 12/07/2015, 9:02 PM  [facility]  Office  [phone]  CC: Primary care physician; No PCP Per Patient

## 2015-12-07 NOTE — ED Provider Notes (Signed)
[facility] Emergency Department Provider Note    First MD Initiated Contact with Patient 12/07/15 1631     (approximate)  I have reviewed the triage vital signs and the nursing notes.   HISTORY  Chief Complaint Shortness of Breath    HPI [name] [name] is a 65 y.o. female with history of breast cancer presents with 3 days of worsening shortness of breath. Patient denies any chest pain, or fevers. Was originally started on a new medication for breast cancer and states that she thinks this is a side effect of that medication. She denies any cough. Denies any nausea or abdominal pain. Denies any dysuria. Denies any history of clots or orthopnea. She is a nonsmoker   Past Medical History:  Diagnosis Date  . Anemia   . Blood transfusion without reported diagnosis   . Emphysema of lung ([place])   . History of left breast cancer , known active 2013  . Personal history of malignant neoplasm of breast     Patient Active Problem List   Diagnosis Date Noted  . 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 Pain of metastatic malignancy 08/14/2015  . Malignant neoplasm of central portion of left female breast ([place]) 04/30/2015  . 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 Neuropathy ([place]) 11/19/2014  . Weight loss 11/10/2014  . Hypokalemia 09/24/2014  . 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 Breast cancer ([place]) 01/22/2014  . 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 Cancer of left breast ([place]) 10/10/2012    Past Surgical History:  Procedure Laterality Date  . ABDOMINAL HYSTERECTOMY  1995  . BREAST BIOPSY Left 2014  . BREAST BIOPSY Left 01-21-14  . BREAST SURGERY Right 1996   mastectomy  . mastectomy Right 1997  . NEPHRECTOMY    . oophrectomy  1980    Prior to Admission medications   Medication Sig Start Date End Date Taking? Authorizing Provider  Calcium Carb-Cholecalciferol (CALCIUM  600 + D PO) Take 1 tablet by mouth 2 (two) times daily.    Historical Provider, MD  everolimus (AFINITOR) 10 MG tablet Take 1 tablet (10 mg total) by mouth daily. 10/21/15   Lequita Asal, MD  exemestane (AROMASIN) 25 MG tablet Take 1 tablet (25 mg total) by mouth daily after breakfast. 10/21/15   Lequita Asal, MD  KLOR-CON 10 10 MEQ tablet TAKE 1 TABLET (10 MEQ TOTAL) BY MOUTH DAILY. 05/25/15   Lequita Asal, MD  LORazepam (ATIVAN) 0.5 MG tablet Take 0.5 mg by mouth daily as needed for anxiety.  08/30/14   Historical Provider, MD  ondansetron (ZOFRAN) 8 MG tablet Take 1 tablet (8 mg total) by mouth every 8 (eight) hours as needed for nausea or vomiting. 10/21/15   Lequita Asal, MD  traMADol (ULTRAM) 50 MG tablet 1 - 2 tablets (50 - 100 mg) every 6 hours as needed for pain 11/21/15   Lequita Asal, MD    Allergies Sulfa antibiotics  Family History  Problem Relation Age of Onset  . Cancer Father   . Heart disease Sister   . Diabetes Sister   . Heart disease Brother   . Cancer Paternal Grandmother     breast    Social History Social History  Substance Use Topics  . Smoking status: Current Every Day Smoker    Packs/day: 0.25    Years: 25.00    Types: Cigarettes  . Smokeless tobacco: Never Used  Comment: 10/29/14-now decreased. smokes 1 pack per week  . Alcohol use No    Review of Systems Patient denies headaches, rhinorrhea, blurry vision, numbness, shortness of breath, chest pain, edema, cough, abdominal pain, nausea, vomiting, diarrhea, dysuria, fevers, rashes or hallucinations unless otherwise stated above in HPI. ____________________________________________   PHYSICAL EXAM:  VITAL SIGNS: Vitals:   12/07/15 1403 12/07/15 1622  BP: 129/86 116/78  Pulse: (!) 107 (!) 113  Resp: 18 (!) 22  Temp: 98.2 F (36.8 C)     Constitutional: Alert and oriented. Ill appearing in moderate respiratory distress Eyes: Conjunctivae are normal. PERRL. EOMI. Head:  Atraumatic. Nose: No congestion/rhinnorhea. Mouth/Throat: Mucous membranes are moist.  Oropharynx non-erythematous. Neck: No stridor. Painless ROM. No cervical spine tenderness to palpation Hematological/Lymphatic/Immunilogical: No cervical lymphadenopathy. Cardiovascular: Tachycardic, regular rhythm. Grossly normal heart sounds.  Good peripheral circulation. Respiratory: Normal respiratory effort.  No retractions. Breath sounds diminished on the left side Gastrointestinal: Soft and nontender. No distention. No abdominal bruits. No CVA tenderness. Genitourinary:  Musculoskeletal: No lower extremity tenderness nor edema.  No joint effusions. Neurologic:  Normal speech and language. No gross focal neurologic deficits are appreciated. No gait instability. Skin:  Skin is warm, dry and intact. Large ulcerative wound to the left anterior chest wall without evidence of purulence or surrounding erythema Psychiatric: Mood and affect are normal. Speech and behavior are normal. ________________________________________   LABS (all labs ordered are listed, but only abnormal results are displayed)  Results for orders placed or performed during the hospital encounter of 12/07/15 (from the past 24 hour(s))  CBC with Differential     Status: Abnormal   Collection Time: 12/07/15  2:17 PM  Result Value Ref Range   WBC 8.6 3.6 - 11.0 K/uL   RBC 4.32 3.80 - 5.20 MIL/uL   Hemoglobin 13.1 12.0 - 16.0 g/dL   HCT 39.4 35.0 - 47.0 %   MCV 91.2 80.0 - 100.0 fL   MCH 30.3 26.0 - 34.0 pg   MCHC 33.2 32.0 - 36.0 g/dL   RDW 18.4 (H) 11.5 - 14.5 %   Platelets 292 150 - 440 K/uL   Neutrophils Relative % 70 %   Neutro Abs 6.1 1.4 - 6.5 K/uL   Lymphocytes Relative 17 %   Lymphs Abs 1.5 1.0 - 3.6 K/uL   Monocytes Relative 11 %   Monocytes Absolute 1.0 (H) 0.2 - 0.9 K/uL   Eosinophils Relative 1 %   Eosinophils Absolute 0.0 0 - 0.7 K/uL   Basophils Relative 1 %   Basophils Absolute 0.1 0 - 0.1 K/uL  Basic  metabolic panel     Status: Abnormal   Collection Time: 12/07/15  2:17 PM  Result Value Ref Range   Sodium 135 135 - 145 mmol/L   Potassium 4.2 3.5 - 5.1 mmol/L   Chloride 100 (L) 101 - 111 mmol/L   CO2 26 22 - 32 mmol/L   Glucose, Bld 103 (H) 65 - 99 mg/dL   BUN 11 6 - 20 mg/dL   Creatinine, Ser 0.76 0.44 - 1.00 mg/dL   Calcium 9.7 8.9 - 10.3 mg/dL   GFR calc non Af Amer >60 >60 mL/min   GFR calc Af Amer >60 >60 mL/min   Anion gap 9 5 - 15   ____________________________________________  EKG My review and personal interpretation at Time: 14:12   Indication: sob,  Rate: 110  Rhythm: nsr Axis: normal Other: low voltage, no acute ischemia, poor r wave progression ____________________________________________  RADIOLOGY  Chest x-ray shows loculated fluid collection in the left lung fields concerning for abscess versus metastasis. No evidence of pneumothorax  CTA chest IMPRESSION: 1. No pulmonary embolus or thoracic aortic dissection is identified. 2. Increase in pericardial effusion, now moderate. 3. Increased pleural fluid with small right and trace left effusions. Increased loculation along the left major fissure Minor associated dependent atelectasis in the lower lobes. 4. Increasing infiltrative neoplasm centered in the left anterolateral chest wall with extension into left pectoralis muscle, left trapezius muscle, left axilla with nodularity along the anterior surface of the sternum, extending to the contralateral right chest wall and left upper abdomen wall. 5. Increased size of numerous skin nodules of anterior chest and abdomen possibly representing metastasis. 6. Possible increase in pleural metastasis along the left heart border and left anterior costophrenic sulcus. 7. Moderate emphysema. ____________________________________________   PROCEDURES  Procedure(s) performed: none    Critical Care performed:  no ____________________________________________   INITIAL IMPRESSION / ASSESSMENT AND PLAN / ED COURSE  Pertinent labs & imaging results that were available during my care of the patient were reviewed by me and considered in my medical decision making (see chart for details).  DDX: Mass, effusion, abscess, pneumonia, heart failure, PE  [name] [name] [place] is a 65 y.o. who presents to the ED with several days of worsening shortness of breath. Patient is afebrile but tachycardic and to. Patient does appear in moderate respiratory distress. Her lung sounds are diminished on the left side and given her history of breast cancer I am concerned for mass versus effusion. Patient also with active cancer therefore pulmonary embolism is also a concern given her tachycardia.  Presentation was consistent with pneumonia that she is at increased risk for this. Based on her presentation chest x-ray was ordered and shows increased consolidation in the left with fluid collection concerning for abscess versus malignant metastasis. To further characterize and evaluate for pulmonary embolism will order CT imaging of the chest.  The patient will be placed on continuous pulse oximetry and telemetry for monitoring.  Laboratory evaluation will be sent to evaluate for the above complaints.     Clinical Course  Value Comment By Time  DG Chest 2 View (Reviewed) Merlyn Lot, MD 08/27 1651   CT imaging shows evidence of interval worsening of pleural and pericardial effusion. She does not have any Physiology at This Time. I Doubt This Is Secondary to Acute Infectious Process. Given Her History of Inflammatory Breast Disease Is Most Likely Malignant Effusion and She Does Have Signs of Metastatic Burden along the Pleura. Based on Her Dyspnea Do Feel the Patient Will Require Admission for Further Evaluation and Management.  I spoke with Dr. Margaretmary Eddy who agrees to evaluate patient for admission and management. Have discussed with the  patient and available family all diagnostics and treatments performed thus far and all questions were answered to the best of my ability. The patient demonstrates understanding and agreement with plan.  Merlyn Lot, MD 08/27 2017     ____________________________________________   FINAL CLINICAL IMPRESSION(S) / ED DIAGNOSES  Final diagnoses:  Dyspnea  Malignant pleural effusion  Pericardial effusion without cardiac tamponade      NEW MEDICATIONS STARTED DURING THIS VISIT:  New Prescriptions   No medications on file     Note:  This document was prepared using Dragon voice recognition software and may include unintentional dictation errors.    Merlyn Lot, MD 12/07/15 2107

## 2015-12-08 ENCOUNTER — Inpatient Hospital Stay: Payer: Medicare Other

## 2015-12-08 ENCOUNTER — Inpatient Hospital Stay (HOSPITAL_COMMUNITY)
Admit: 2015-12-08 | Discharge: 2015-12-08 | Disposition: A | Discharge status: Home / Self Care | Payer: Medicare Other | Attending: Physician Assistant | Admitting: Physician Assistant

## 2015-12-08 ENCOUNTER — Inpatient Hospital Stay: Admit: 2015-12-08 | Payer: Medicare Other

## 2015-12-08 DIAGNOSIS — E43 Unspecified severe protein-calorie malnutrition: Secondary | ICD-10-CM | POA: Diagnosis present

## 2015-12-08 DIAGNOSIS — G893 Neoplasm related pain (acute) (chronic): Secondary | ICD-10-CM

## 2015-12-08 DIAGNOSIS — I313 Pericardial effusion (noninflammatory): Secondary | ICD-10-CM

## 2015-12-08 DIAGNOSIS — F17210 Nicotine dependence, cigarettes, uncomplicated: Secondary | ICD-10-CM

## 2015-12-08 DIAGNOSIS — G629 Polyneuropathy, unspecified: Secondary | ICD-10-CM

## 2015-12-08 DIAGNOSIS — Z9011 Acquired absence of right breast and nipple: Secondary | ICD-10-CM

## 2015-12-08 DIAGNOSIS — Z79899 Other long term (current) drug therapy: Secondary | ICD-10-CM

## 2015-12-08 DIAGNOSIS — J90 Pleural effusion, not elsewhere classified: Principal | ICD-10-CM

## 2015-12-08 DIAGNOSIS — Z170 Estrogen receptor positive status [ER+]: Secondary | ICD-10-CM

## 2015-12-08 DIAGNOSIS — Z853 Personal history of malignant neoplasm of breast: Secondary | ICD-10-CM

## 2015-12-08 DIAGNOSIS — C50912 Malignant neoplasm of unspecified site of left female breast: Secondary | ICD-10-CM

## 2015-12-08 DIAGNOSIS — Z9221 Personal history of antineoplastic chemotherapy: Secondary | ICD-10-CM

## 2015-12-08 DIAGNOSIS — Z171 Estrogen receptor negative status [ER-]: Secondary | ICD-10-CM

## 2015-12-08 DIAGNOSIS — I319 Disease of pericardium, unspecified: Secondary | ICD-10-CM

## 2015-12-08 LAB — CBC
HCT: 34.8 % — ABNORMAL LOW (ref 35.0–47.0)
HEMOGLOBIN: 12.1 g/dL (ref 12.0–16.0)
MCH: 31 pg (ref 26.0–34.0)
MCHC: 34.6 g/dL (ref 32.0–36.0)
MCV: 89.6 fL (ref 80.0–100.0)
Platelets: 275 10*3/uL (ref 150–440)
RBC: 3.88 MIL/uL (ref 3.80–5.20)
RDW: 18.2 % — ABNORMAL HIGH (ref 11.5–14.5)
WBC: 6.9 10*3/uL (ref 3.6–11.0)

## 2015-12-08 LAB — BASIC METABOLIC PANEL
ANION GAP: 8 (ref 5–15)
BUN: 7 mg/dL (ref 6–20)
CALCIUM: 9.3 mg/dL (ref 8.9–10.3)
CO2: 26 mmol/L (ref 22–32)
Chloride: 104 mmol/L (ref 101–111)
Creatinine, Ser: 0.68 mg/dL (ref 0.44–1.00)
GLUCOSE: 102 mg/dL — AB (ref 65–99)
POTASSIUM: 3.9 mmol/L (ref 3.5–5.1)
SODIUM: 138 mmol/L (ref 135–145)

## 2015-12-08 LAB — BODY FLUID CELL COUNT WITH DIFFERENTIAL
Eos, Fluid: 0 %
LYMPHS FL: 50 %
MONOCYTE-MACROPHAGE-SEROUS FLUID: 42 %
Neutrophil Count, Fluid: 8 %
OTHER CELLS FL: 0 %
Total Nucleated Cell Count, Fluid: 2157 cu mm

## 2015-12-08 LAB — APTT: APTT: 29 s (ref 24–36)

## 2015-12-08 LAB — PROTEIN, BODY FLUID: TOTAL PROTEIN, FLUID: 4 g/dL

## 2015-12-08 LAB — ECHOCARDIOGRAM LIMITED
Height: 67 in
WEIGHTICAEL: 2352 [oz_av]

## 2015-12-08 LAB — PROTIME-INR
INR: 1.05
PROTHROMBIN TIME: 13.7 s (ref 11.4–15.2)

## 2015-12-08 LAB — LACTATE DEHYDROGENASE, PLEURAL OR PERITONEAL FLUID: LD, Fluid: 186 U/L — ABNORMAL HIGH (ref 3–23)

## 2015-12-08 LAB — MRSA PCR SCREENING: MRSA BY PCR: NEGATIVE

## 2015-12-08 MED ORDER — ENSURE ENLIVE PO LIQD
237.0000 mL | Freq: Two times a day (BID) | ORAL | Status: DC
  Administered 2015-12-08 – 2015-12-09 (×3): 237 mL via ORAL

## 2015-12-08 NOTE — Consult Note (Signed)
Cardiology Consultation Note  Patient ID: [name] [name], MRN: [id number], DOB/AGE: [birth year] 63 y.o. Admit date: 12/07/2015   Date of Consult: 12/08/2015 Primary Physician: No PCP Per Patient Primary Cardiologist: New to [facility] Requesting Physician: Dr. Lavetta Nielsen, MD  Chief Complaint: SOB Reason for Consult: Pericardial effusion  HPI: 65 y.o. female with h/o recurrent malignant breast cancer s/p radical mastectomy in 1998 s/p radiation, chemotherapy and medical management with Taxotere, Cytoxin, Adrimycin/cyclophosphamide, Aromasin, Afinitor, Femara, letrozole, Ibrance, Taxol, Xeloda, Halaven, and Keytruda. Also with history of anemia s/p prior transfusion, COPD, and anxiety who presented to [facility] with 2 day history of increased SOB. Cardiology is asked to evaluate for moderate pericardial effusion seen on CTA chest on 8/27.   She denies any previously known cardiac history and has never seen a cardiologist before. She previously underwent CT chest on 10/20/15 that showed a trace pericardial effusion. Prior to this study imaging from 06/2015 did not reveal pericardial effusion. Follow up echo on 11/20/15 showed an EF of 50-55%, pericardium was poorly visualized though no evidence of pericardial effusion. Over the past two days she has noted an increase in her baseline SOB. She now has to stop to take breaks with minimal ambulation in her house. Cough is at baseline. No weight gain, orthopnea, or early satiety though she has a poor appetite at baseline.   Upon the patient's arrival to [facility] they were found to have a bilateral pleural effusions on CTA chest as well as an increase in pericardial effusion to moderate in size per CTA chest. No evidence of PE. HGB 13.1, K+ 4.2, SCr 0.76, troponin negative x 1. ECG as below. She is currently on [place] at 2L.   Past Medical History:  Diagnosis Date  . Anemia   . Anxiety   . Blood transfusion without reported diagnosis   . Emphysema of lung ([place])   . History of  left breast cancer , known active 2013  . Personal history of malignant neoplasm of breast       Most Recent Cardiac Studies: Echo 11/20/2015: Study Conclusions  - Procedure narrative: Transthoracic echocardiography. Image   quality was suboptimal. The study was technically difficult, as a   result of poor acoustic windows and surgical dressings. No   available apical views. - Left ventricle: The cavity size was normal. There was mild focal   basal hypertrophy of the septum. Systolic function was low   normal. The estimated ejection fraction was  50% Limited study   though. The study is not technically sufficient to allow   evaluation of LV diastolic function. - Pericardium:  Not well visualized. There was no pericardial effusion.   Surgical History:  Past Surgical History:  Procedure Laterality Date  . ABDOMINAL HYSTERECTOMY  1995  . BREAST BIOPSY Left 2014  . BREAST BIOPSY Left 01-21-14  . BREAST SURGERY Right 1996   mastectomy  . mastectomy Right 1997  . NEPHRECTOMY    . oophrectomy  1980     Home Meds: Prior to Admission medications   Medication Sig Start Date End Date Taking? Authorizing Provider  Calcium Carb-Cholecalciferol (CALCIUM 600 + D PO) Take 1 tablet by mouth 2 (two) times daily.   Yes Historical Provider, MD  KLOR-CON 10 10 MEQ tablet TAKE 1 TABLET (10 MEQ TOTAL) BY MOUTH DAILY. 05/25/15  Yes Lequita Asal, MD  LORazepam (ATIVAN) 0.5 MG tablet Take 0.5 mg by mouth daily as needed for anxiety.  08/30/14  Yes Historical Provider, MD  ondansetron (  ZOFRAN) 8 MG tablet Take 1 tablet (8 mg total) by mouth every 8 (eight) hours as needed for nausea or vomiting. 10/21/15  Yes Lequita Asal, MD  traMADol (ULTRAM) 50 MG tablet 1 - 2 tablets (50 - 100 mg) every 6 hours as needed for pain Patient taking differently: Take 50-100 mg by mouth every 6 (six) hours as needed for moderate pain.  11/21/15  Yes Lequita Asal, MD    Inpatient Medications:  .  enoxaparin (LOVENOX) injection  40 mg Subcutaneous Q24H  . sodium chloride flush  3 mL Intravenous Q12H      Allergies:  Allergies  Allergen Reactions  . Sulfa Antibiotics Nausea And Vomiting and Other (See Comments)    headache    Social History   Social History  . Marital status: Single    Spouse name: N/A  . Number of children: N/A  . Years of education: N/A   Occupational History  . Not on file.   Social History Main Topics  . Smoking status: Current Every Day Smoker    Packs/day: 0.25    Years: 25.00    Types: Cigarettes  . Smokeless tobacco: Never Used     Comment: 10/29/14-now decreased. smokes 1 pack per week  . Alcohol use No  . Drug use: No  . Sexual activity: Not Currently   Other Topics Concern  . Not on file   Social History Narrative  . No narrative on file     Family History  Problem Relation Age of Onset  . Cancer Father   . Heart disease Sister   . Diabetes Sister   . Heart disease Brother   . Cancer Paternal Grandmother     breast     Review of Systems: Review of Systems  Constitutional: Positive for malaise/fatigue and weight loss. Negative for chills, diaphoresis and fever.  HENT: Negative for congestion.   Eyes: Negative for discharge and redness.  Respiratory: Positive for cough and shortness of breath. Negative for hemoptysis, sputum production and wheezing.   Cardiovascular: Negative for chest pain, palpitations, orthopnea, claudication, leg swelling and PND.  Gastrointestinal: Negative for abdominal pain, blood in stool, heartburn, melena, nausea and vomiting.  Genitourinary: Negative for hematuria.  Musculoskeletal: Negative for falls and myalgias.  Skin: Negative for rash.  Neurological: Positive for weakness. Negative for dizziness, tingling, tremors, sensory change, speech change, focal weakness and loss of consciousness.  Endo/Heme/Allergies: Does not bruise/bleed easily.  Psychiatric/Behavioral: Negative for substance  abuse. The patient is not nervous/anxious.   All other systems reviewed and are negative.   Labs:  Recent Labs  12/07/15 1417  TROPONINI <0.03   Lab Results  Component Value Date   WBC 6.9 12/08/2015   HGB 12.1 12/08/2015   HCT 34.8 (L) 12/08/2015   MCV 89.6 12/08/2015   PLT 275 12/08/2015    Recent Labs Lab 12/08/15 0429  NA 138  K 3.9  CL 104  CO2 26  BUN 7  CREATININE 0.68  CALCIUM 9.3  GLUCOSE 102*   No results found for: CHOL, HDL, LDLCALC, TRIG No results found for: DDIMER  Radiology/Studies:  Dg Chest 2 View  Result Date: 12/07/2015 IMPRESSION: 1. Increase in volume of loculated fluid along the LEFT oblique fissure. 2. Increased in basilar pleural effusions. 3. Increased fullness in the LEFT suprahilar mediastinum is concerning for loculated fluid versus pleural metastasis. Electronically Signed   By: Suzy Bouchard M.D.   On: 12/07/2015 14:42   Ct Angio Chest Pe  W And/or Wo Contrast  Result Date: 12/07/2015 IMPRESSION: 1. No pulmonary embolus or thoracic aortic dissection is identified. 2. Increase in pericardial effusion, now moderate. 3. Increased pleural fluid with small right and trace left effusions. Increased loculation along the left major fissure Minor associated dependent atelectasis in the lower lobes. 4. Increasing infiltrative neoplasm centered in the left anterolateral chest wall with extension into left pectoralis muscle, left trapezius muscle, left axilla with nodularity along the anterior surface of the sternum, extending to the contralateral right chest wall and left upper abdomen wall. 5. Increased size of numerous skin nodules of anterior chest and abdomen possibly representing metastasis. 6. Possible increase in pleural metastasis along the left heart border and left anterior costophrenic sulcus. 7. Moderate emphysema. Electronically Signed   By: Kristine Garbe M.D.   On: 12/07/2015 19:01    EKG: Interpreted by me showed: sinus  tachycardia, 109 bpm, low voltage, occasional PVC, nonspecific st/t changes Telemetry: Interpreted by me showed: NSR 90's bpm  Weights: Filed Weights   12/07/15 1404 12/07/15 2245  Weight: 150 lb (68 kg) 147 lb (66.7 kg)     Physical Exam: Blood pressure 126/75, pulse 89, temperature 97.7 F (36.5 C), temperature source Oral, resp. rate 14, height 5\' 7"  (1.702 m), weight 147 lb (66.7 kg), SpO2 100 %. Body mass index is 23.02 kg/m. General: Well developed, well nourished, in no acute distress. Head: Normocephalic, atraumatic, sclera non-icteric, no xanthomas, nares are without discharge.  Neck: Negative for carotid bruits. JVD not elevated. Lungs: Crackles bilateral bases. Breathing is unlabored. Heart: RRR with S1 S2. No murmurs, rubs, or gallops appreciated. Abdomen: Soft, non-tender, non-distended with normoactive bowel sounds. No hepatomegaly. No rebound/guarding. No obvious abdominal masses. Msk:  Strength and tone appear normal for age. Extremities: No clubbing or cyanosis. No edema. Distal pedal pulses are 2+ and equal bilaterally. Neuro: Alert and oriented X 3. No facial asymmetry. No focal deficit. Moves all extremities spontaneously. Psych:  Responds to questions appropriately with a normal affect.    Assessment and Plan:  Principal Problem:   Pleural effusion Active Problems:   Metastatic breast cancer (HCC)   Anxiety   Pericardial effusion    1. Pericardial effusion: -Initially reported as trace on 10/20/15. Now reported as moderate in size from CTA chest 12/07/15 -Recent echo from 11/20/15 without evidence of pericardial effusion -Recheck echo to evaluate EF given treatment for breast cancer as well as to evaluate pericardium -No evidence of pulsus paradoxus  -Possibly playing a role in her SOB if there is significant increase in pericardial fluid -Further recommendations pending echo today  2. Bilateral pleural effusions: -She is scheduled for a therapeutic  thoracentesis today per IM -Consider low-dose Lasix -Likely in the setting of her cancer  3. Recurrent malignant breast cancer s/p mastectomy in 1998 s/p radiation and chemo: -Has been seen by [facility] for 2nd opinion without recommendation of further invasive intervention -Per oncology    Signed, Christell Faith, PA-C [place] Pager: [phone] 12/08/2015, 9:37 AM

## 2015-12-08 NOTE — Consult Note (Signed)
[facility] Nurse wound consult note Reason for Consult:Fungating breast tumor to left breast/lateral chest. Wound type: Fungating breast CA Pressure Ulcer POA: N/A Measurement:6 cm x 10 cm unable to visualize wound bed.  Yellow, devitalized tissue to wound bed.  Wound bed:100% devitalized tissue.  Drainage (amount, consistency, odor) Moderate serosanguinous effluent.  Foul, pungent odor.  Periwound:Intact.  Medical Adhesive Related Skin Injury (MARSI) 9:00 Dressing procedure/placement/frequency:Cleanse CA lesion to left breast with NS and pat gently dry.  Apply Aquacel Ag to wound bed.  Cover with ABD pad and tape.  Change daily . Will not follow at this time.  Please re-consult if needed.  Domenic Moras RN BSN [place] Pager [phone]

## 2015-12-08 NOTE — Care Management (Signed)
Admitted to [facility] with the diagnosis of pleural effusion. Lives alone, but rents out one of hers rooms to [employer]. Friend is Chilton Si [phone]). Last seen [name] 2 weeks ago.May have assigned primary care physician through insurance, but always goes to the [facility].  No home health. No Skilled facility. No home oxygen. Uses no aids for ambulation. Takes care of all basic and instrumental activities of daily living herself, drives if needed. Family helps with errands. Prescriptions are filled at [facility] on [address]. No falls. Decreased appetite x 1 month. 15 pound weigh loss. Family/friend will transport. Shelbie Ammons RN MSN CCM Care Management [phone]

## 2015-12-08 NOTE — Care Management Important Message (Signed)
Important Message  Patient Details  Name: KAYLEA GENSKE MRN: [id number] Date of Birth: 02/14/1951   Medicare Important Message Given:  Yes    Shelbie Ammons, RN 12/08/2015, 8:39 AM

## 2015-12-08 NOTE — Progress Notes (Signed)
*  PRELIMINARY RESULTS* Echocardiogram 2D Echocardiogram has been performed.  Sherrie Sport 12/08/2015, 9:55 AM

## 2015-12-08 NOTE — Progress Notes (Signed)
Initial Nutrition Assessment  DOCUMENTATION CODES:   Severe malnutrition in context of acute illness/injury  INTERVENTION:  -Monitor intake and cater to pt preferences -Recommend Ensure Enlive po BID, each supplement provides 350 kcal and 20 grams of protein -Discussed high calorie, high protein foods.  Pt verbalized understanding and expect good compliance.   NUTRITION DIAGNOSIS:   Malnutrition related to poor appetite, cancer and cancer related treatments as evidenced by percent weight loss, energy intake < or equal to 50% for > or equal to 5 days.    GOAL:   Patient will meet greater than or equal to 90% of their needs    MONITOR:   PO intake, Supplement acceptance, Weight trends  REASON FOR ASSESSMENT:   Malnutrition Screening Tool    ASSESSMENT:      Pt admitted with shortness of breath, s/p thoracentesis secondary to pleural effusion  Past Medical History:  Diagnosis Date  . Anemia   . Anxiety   . Blood transfusion without reported diagnosis   . Emphysema of lung ([place])   . History of left breast cancer , known active 2013  . Personal history of malignant neoplasm of breast    Pt reports for the past month poor po intake, just eating few bites over the past month and maybe drinking an ensure at times  Medications reviewed:  Labs reviewed: glucose 102  Nutrition-Focused physical exam completed. Findings are no fat depletion, normal to mild in thigh area muscle depletion, and no edema.    Diet Order:  Diet regular Room service appropriate? Yes; Fluid consistency: Thin  Skin:  Reviewed, no issues  Last BM:  8/27  Height:   Ht Readings from Last 1 Encounters:  12/07/15 5\' 7"  (1.702 m)    Weight: Pt reports wt loss of 10 pounds in the last 1 months (6% wt loss in the last month)  Wt Readings from Last 1 Encounters:  12/07/15 147 lb (66.7 kg)    Ideal Body Weight:     BMI:  Body mass index is 23.02 kg/m.  Estimated Nutritional Needs:    Kcal:  [id number] kcals/d  Protein:  80-101 g/d  Fluid:  >/= 2 L/d  EDUCATION NEEDS:   Education needs addressed  Marwan Lipe B. Zenia Resides, [place], [place] (pager) Weekend/On-Call pager [phone])

## 2015-12-08 NOTE — Progress Notes (Signed)
[place] [facility] [place] NAME: [name] [name]    MRN#:  [id number]  DATE OF BIRTH:  05/22/1950  SUBJECTIVE:  Hospital Day: 1 day [name] [name] is a 65 y.o. female presenting with Shortness of Breath .   Overnight events: No overnight events Interval Events: Status post thoracentesis, patient states improved breathing  REVIEW OF SYSTEMS:  CONSTITUTIONAL: No fever, fatigue or weakness.  EYES: No blurred or double vision.  EARS, NOSE, AND THROAT: No tinnitus or ear pain.  RESPIRATORY: No cough, shortness of breath, wheezing or hemoptysis.  CARDIOVASCULAR: No chest pain, orthopnea, edema.  GASTROINTESTINAL: No nausea, vomiting, diarrhea or abdominal pain.  GENITOURINARY: No dysuria, hematuria.  ENDOCRINE: No polyuria, nocturia,  HEMATOLOGY: No anemia, easy bruising or bleeding SKIN: No rash or lesion. MUSCULOSKELETAL: No joint pain or arthritis.   NEUROLOGIC: No tingling, numbness, weakness.  PSYCHIATRY: No anxiety or depression.   DRUG ALLERGIES:   Allergies  Allergen Reactions  . Sulfa Antibiotics Nausea And Vomiting and Other (See Comments)    headache    VITALS:  Blood pressure (!) 140/96, pulse (!) 124, temperature 97.7 F (36.5 C), temperature source Oral, resp. rate 18, height 5\' 7"  (1.702 m), weight 66.7 kg (147 lb), SpO2 97 %.  PHYSICAL EXAMINATION:  VITAL SIGNS: Vitals:   12/08/15 1024 12/08/15 1049  BP: 129/73 (!) 140/96  Pulse: (!) 106 (!) 124  Resp: 16 18  Temp:     GENERAL:64 y.o.female currently in no acute distress.  HEAD: Normocephalic, atraumatic.  EYES: Pupils equal, round, reactive to light. Extraocular muscles intact. No scleral icterus.  MOUTH: Moist mucosal membrane. Dentition intact. No abscess noted.  EAR, NOSE, THROAT: Clear without exudates. No external lesions.  NECK: Supple. No thyromegaly. No nodules. No JVD.  PULMONARY: Diminished but otherwise Clear to ascultation, without wheeze rails  or rhonci. No use of accessory muscles, Good respiratory effort. good air entry bilaterally CHEST: Nontender to palpation.  CARDIOVASCULAR: S1 and S2. Regular rate and rhythm. No murmurs, rubs, or gallops. No edema. Pedal pulses 2+ bilaterally.  GASTROINTESTINAL: Soft, nontender, nondistended. No masses. Positive bowel sounds. No hepatosplenomegaly.  MUSCULOSKELETAL: No swelling, clubbing, or edema. Range of motion full in all extremities.  NEUROLOGIC: Cranial nerves II through XII are intact. No gross focal neurological deficits. Sensation intact. Reflexes intact.  SKIN: No ulceration, lesions, rashes, or cyanosis. Skin warm and dry. Turgor intact.  PSYCHIATRIC: Mood, affect within normal limits. The patient is awake, alert and oriented x 3. Insight, judgment intact.      LABORATORY PANEL:   CBC  Recent Labs Lab 12/08/15 0429  WBC 6.9  HGB 12.1  HCT 34.8*  PLT 275   ------------------------------------------------------------------------------------------------------------------  Chemistries   Recent Labs Lab 12/08/15 0429  NA 138  K 3.9  CL 104  CO2 26  GLUCOSE 102*  BUN 7  CREATININE 0.68  CALCIUM 9.3   ------------------------------------------------------------------------------------------------------------------  Cardiac Enzymes  Recent Labs Lab 12/07/15 1417  TROPONINI <0.03   ------------------------------------------------------------------------------------------------------------------  RADIOLOGY:  Dg Chest 1 View  Result Date: 12/08/2015 CLINICAL DATA:  Status post right thoracentesis today. Postprocedural imaging. EXAM: CHEST 1 VIEW COMPARISON:  PA and lateral chest and CT chest 12/07/2015. FINDINGS: Small right pleural effusion seen on the comparison examination is markedly decreased. No pneumothorax. The lungs are emphysematous. Left pleural effusion including a loculated component along the major fissure is unchanged. Left chest wall masses seen  on the prior CT are not well demonstrated on this  exam. IMPRESSION: Negative for pneumothorax after right thoracentesis. No new abnormality. Electronically Signed   By: Inge Rise M.D.   On: 12/08/2015 11:19   Dg Chest 2 View  Result Date: 12/07/2015 CLINICAL DATA:  RIGHT-sided mastectomy 20 years prior. LEFT breast cancer recurrence. EXAM: CHEST  2 VIEW COMPARISON:  CT 10/20/2015 FINDINGS: RIGHT port noted. Stable enlarged cardiac silhouette. Increased in fullness along the LEFT suprahilar mediastinum measuring up to 5 cm. There is increase in intrafissural fluid in the LEFT lung along the oblique fissure. Bilateral small pleural effusions at the lung bases are also increased. No pulmonary edema. No focal infiltrate. IMPRESSION: 1. Increase in volume of loculated fluid along the LEFT oblique fissure. 2. Increased in basilar pleural effusions. 3. Increased fullness in the LEFT suprahilar mediastinum is concerning for loculated fluid versus pleural metastasis. Electronically Signed   By: Suzy Bouchard M.D.   On: 12/07/2015 14:42   Ct Angio Chest Pe W And/or Wo Contrast  Result Date: 12/07/2015 CLINICAL DATA:  65 y/o F; history of breast cancer with worsening shortness of breath. EXAM: CT ANGIOGRAPHY CHEST WITH CONTRAST TECHNIQUE: Multidetector CT imaging of the chest was performed using the standard protocol during bolus administration of intravenous contrast. Multiplanar CT image reconstructions and MIPs were obtained to evaluate the vascular anatomy. CONTRAST:  75 cc Isovue 370. COMPARISON:  Chest x-ray 12/07/2015 and CT chest 10/20/2015. FINDINGS: Mediastinum/Lymph Nodes: No pulmonary embolus or thoracic aortic dissection is identified. Moderate pericardial effusion. Normal heart size. Lungs/Pleura: Small right and trace left pleural effusions. Loculated fluid tracking along the left major fissure. Moderate centrilobular emphysema with predominant upper lobe distribution. Minor dependent opacities  within the lower lobes are probably atelectasis. Nodularity along the left aspect of the pericardium and left anterior costophrenic angle is partially obscured by pleural fluid and poorly enhancing due to contrast bolus timing. Upper abdomen: No acute findings. Musculoskeletal: There is a large wound on the left chest, extensive skin thickening and multiple foci infiltrative soft tissue consistent with metastatic disease extending inferiorly below the field of view in the left lateral abdominal wall, extending to the contralateral subcutaneous fat of the right thorax, extending along the sternum, invading the left pectoralis muscle, and invading the left trapezius muscle muscle in the left lateral chest wall. The degree of soft tissue infiltration has increased from the prior CT for example a large left lateral chest wall lesion measures 54 x 20 mm axially, previously 49 x 60 mm (series 5, image 69). Review of the MIP images confirms the above findings. IMPRESSION: 1. No pulmonary embolus or thoracic aortic dissection is identified. 2. Increase in pericardial effusion, now moderate. 3. Increased pleural fluid with small right and trace left effusions. Increased loculation along the left major fissure Minor associated dependent atelectasis in the lower lobes. 4. Increasing infiltrative neoplasm centered in the left anterolateral chest wall with extension into left pectoralis muscle, left trapezius muscle, left axilla with nodularity along the anterior surface of the sternum, extending to the contralateral right chest wall and left upper abdomen wall. 5. Increased size of numerous skin nodules of anterior chest and abdomen possibly representing metastasis. 6. Possible increase in pleural metastasis along the left heart border and left anterior costophrenic sulcus. 7. Moderate emphysema. Electronically Signed   By: Kristine Garbe M.D.   On: 12/07/2015 19:01    EKG:   Orders placed or performed during the  hospital encounter of 12/07/15  . ED EKG  . ED EKG  . ED  EKG  . ED EKG    ASSESSMENT AND PLAN:   [name] [name] is a 65 y.o. female presenting with Shortness of Breath . Admitted 12/07/2015 : Day #: 1 day 1. Acute respiratory failure with hypoxia secondary pleural effusion: Status post thoracentesis, improved symptoms, trial off of oxygen 2. Pericardial effusion: Cardiology input appreciated, echocardiogram performed 3. Metastatic breast cancer: Oncology consult, cancer is invasive with erosion of chest wall-wound consult   All the records are reviewed and case discussed with Care Management/Social Workerr. Management plans discussed with the patient, family and they are in agreement.  CODE STATUS: dnr TOTAL TIME TAKING CARE OF THIS PATIENT: 28 minutes.   POSSIBLE D/C IN 1-2DAYS, DEPENDING ON CLINICAL CONDITION.   Hower,  Karenann Cai.D on 12/08/2015 at 12:29 PM  Between 7am to 6pm - Pager - [phone]  After 6pm: House Pager: - [phone]  [facility]  Office  [phone]  CC: Primary care physician; No PCP Per Patient

## 2015-12-09 DIAGNOSIS — I519 Heart disease, unspecified: Secondary | ICD-10-CM

## 2015-12-09 MED ORDER — DOCUSATE SODIUM 100 MG PO CAPS
100.0000 mg | ORAL_CAPSULE | Freq: Two times a day (BID) | ORAL | Status: DC
  Administered 2015-12-09 – 2015-12-10 (×2): 100 mg via ORAL
  Filled 2015-12-09: qty 1

## 2015-12-09 NOTE — Progress Notes (Signed)
[facility] NAME: [name] [name]    MRN#:  [id number]  DATE OF BIRTH:  08-09-1950  SUBJECTIVE:  Hospital Day: 2 days [name] [name] is a 65 y.o. female presenting with Shortness of Breath .   Overnight events: No overnight events Interval Events: Feeling weak no other complaints, multiple family members at bedside  REVIEW OF SYSTEMS:  CONSTITUTIONAL: No fever, fatigue or weakness.  EYES: No blurred or double vision.  EARS, NOSE, AND THROAT: No tinnitus or ear pain.  RESPIRATORY: No cough, shortness of breath, wheezing or hemoptysis.  CARDIOVASCULAR: No chest pain, orthopnea, edema.  GASTROINTESTINAL: No nausea, vomiting, diarrhea or abdominal pain.  GENITOURINARY: No dysuria, hematuria.  ENDOCRINE: No polyuria, nocturia,  HEMATOLOGY: No anemia, easy bruising or bleeding SKIN: No rash or lesion. MUSCULOSKELETAL: No joint pain or arthritis.   NEUROLOGIC: No tingling, numbness, weakness.  PSYCHIATRY: No anxiety or depression.   DRUG ALLERGIES:   Allergies  Allergen Reactions  . Sulfa Antibiotics Nausea And Vomiting and Other (See Comments)    headache    VITALS:  Blood pressure 93/60, pulse 98, temperature 98.4 F (36.9 C), temperature source Oral, resp. rate 18, height 5\' 7"  (1.702 m), weight 66.7 kg (147 lb), SpO2 98 %.  PHYSICAL EXAMINATION:  VITAL SIGNS: Vitals:   12/08/15 2058 12/09/15 0451  BP: 96/65 93/60  Pulse: (!) 108 98  Resp: 18 18  Temp: 97.9 F (36.6 C) 98.4 F (36.9 C)   GENERAL:64 y.o.female currently in no acute distress.  HEAD: Normocephalic, atraumatic.  EYES: Pupils equal, round, reactive to light. Extraocular muscles intact. No scleral icterus.  MOUTH: Moist mucosal membrane. Dentition intact. No abscess noted.  EAR, NOSE, THROAT: Clear without exudates. No external lesions.  NECK: Supple. No thyromegaly. No nodules. No JVD.  PULMONARY: Diminished but otherwise Clear to ascultation,  without wheeze rails or rhonci. No use of accessory muscles, Good respiratory effort. good air entry bilaterally CHEST: Nontender to palpation.  CARDIOVASCULAR: S1 and S2. Regular rate and rhythm. No murmurs, rubs, or gallops. No edema. Pedal pulses 2+ bilaterally.  GASTROINTESTINAL: Soft, nontender, nondistended. No masses. Positive bowel sounds. No hepatosplenomegaly.  MUSCULOSKELETAL: No swelling, clubbing, or edema. Range of motion full in all extremities.  NEUROLOGIC: Cranial nerves II through XII are intact. No gross focal neurological deficits. Sensation intact. Reflexes intact.  SKIN: No ulceration, lesions, rashes, or cyanosis. Skin warm and dry. Turgor intact.  PSYCHIATRIC: Mood, affect within normal limits. The patient is awake, alert and oriented x 3. Insight, judgment intact.      LABORATORY PANEL:   CBC  Recent Labs Lab 12/08/15 0429  WBC 6.9  HGB 12.1  HCT 34.8*  PLT 275   ------------------------------------------------------------------------------------------------------------------  Chemistries   Recent Labs Lab 12/08/15 0429  NA 138  K 3.9  CL 104  CO2 26  GLUCOSE 102*  BUN 7  CREATININE 0.68  CALCIUM 9.3   ------------------------------------------------------------------------------------------------------------------  Cardiac Enzymes  Recent Labs Lab 12/07/15 1417  TROPONINI <0.03   ------------------------------------------------------------------------------------------------------------------  RADIOLOGY:  Dg Chest 1 View  Result Date: 12/08/2015 CLINICAL DATA:  Status post right thoracentesis today. Postprocedural imaging. EXAM: CHEST 1 VIEW COMPARISON:  PA and lateral chest and CT chest 12/07/2015. FINDINGS: Small right pleural effusion seen on the comparison examination is markedly decreased. No pneumothorax. The lungs are emphysematous. Left pleural effusion including a loculated component along the major fissure is unchanged. Left  chest wall masses seen on the prior CT are  not well demonstrated on this exam. IMPRESSION: Negative for pneumothorax after right thoracentesis. No new abnormality. Electronically Signed   By: Inge Rise M.D.   On: 12/08/2015 11:19   Dg Chest 2 View  Result Date: 12/07/2015 CLINICAL DATA:  RIGHT-sided mastectomy 20 years prior. LEFT breast cancer recurrence. EXAM: CHEST  2 VIEW COMPARISON:  CT 10/20/2015 FINDINGS: RIGHT port noted. Stable enlarged cardiac silhouette. Increased in fullness along the LEFT suprahilar mediastinum measuring up to 5 cm. There is increase in intrafissural fluid in the LEFT lung along the oblique fissure. Bilateral small pleural effusions at the lung bases are also increased. No pulmonary edema. No focal infiltrate. IMPRESSION: 1. Increase in volume of loculated fluid along the LEFT oblique fissure. 2. Increased in basilar pleural effusions. 3. Increased fullness in the LEFT suprahilar mediastinum is concerning for loculated fluid versus pleural metastasis. Electronically Signed   By: Suzy Bouchard M.D.   On: 12/07/2015 14:42   Ct Angio Chest Pe W And/or Wo Contrast  Result Date: 12/07/2015 CLINICAL DATA:  65 y/o F; history of breast cancer with worsening shortness of breath. EXAM: CT ANGIOGRAPHY CHEST WITH CONTRAST TECHNIQUE: Multidetector CT imaging of the chest was performed using the standard protocol during bolus administration of intravenous contrast. Multiplanar CT image reconstructions and MIPs were obtained to evaluate the vascular anatomy. CONTRAST:  75 cc Isovue 370. COMPARISON:  Chest x-ray 12/07/2015 and CT chest 10/20/2015. FINDINGS: Mediastinum/Lymph Nodes: No pulmonary embolus or thoracic aortic dissection is identified. Moderate pericardial effusion. Normal heart size. Lungs/Pleura: Small right and trace left pleural effusions. Loculated fluid tracking along the left major fissure. Moderate centrilobular emphysema with predominant upper lobe distribution.  Minor dependent opacities within the lower lobes are probably atelectasis. Nodularity along the left aspect of the pericardium and left anterior costophrenic angle is partially obscured by pleural fluid and poorly enhancing due to contrast bolus timing. Upper abdomen: No acute findings. Musculoskeletal: There is a large wound on the left chest, extensive skin thickening and multiple foci infiltrative soft tissue consistent with metastatic disease extending inferiorly below the field of view in the left lateral abdominal wall, extending to the contralateral subcutaneous fat of the right thorax, extending along the sternum, invading the left pectoralis muscle, and invading the left trapezius muscle muscle in the left lateral chest wall. The degree of soft tissue infiltration has increased from the prior CT for example a large left lateral chest wall lesion measures 54 x 20 mm axially, previously 49 x 60 mm (series 5, image 69). Review of the MIP images confirms the above findings. IMPRESSION: 1. No pulmonary embolus or thoracic aortic dissection is identified. 2. Increase in pericardial effusion, now moderate. 3. Increased pleural fluid with small right and trace left effusions. Increased loculation along the left major fissure Minor associated dependent atelectasis in the lower lobes. 4. Increasing infiltrative neoplasm centered in the left anterolateral chest wall with extension into left pectoralis muscle, left trapezius muscle, left axilla with nodularity along the anterior surface of the sternum, extending to the contralateral right chest wall and left upper abdomen wall. 5. Increased size of numerous skin nodules of anterior chest and abdomen possibly representing metastasis. 6. Possible increase in pleural metastasis along the left heart border and left anterior costophrenic sulcus. 7. Moderate emphysema. Electronically Signed   By: Kristine Garbe M.D.   On: 12/07/2015 19:01   US Thoracentesis Asp  Pleural Space W/img Guide  Result Date: 12/08/2015 INDICATION: Thoracentesis EXAM: ULTRASOUND GUIDED RIGHT THORACENTESIS MEDICATIONS: None.  COMPLICATIONS: None immediate. PROCEDURE: An ultrasound guided thoracentesis was thoroughly discussed with the patient and questions answered. The benefits, risks, alternatives and complications were also discussed. The patient understands and wishes to proceed with the procedure. Written consent was obtained. Ultrasound was performed to localize and mark an adequate pocket of fluid in the right chest. The area was then prepped and draped in the normal sterile fashion. 1% Lidocaine was used for local anesthesia. Under ultrasound guidance a Safe-T-Centesis catheter was introduced. Thoracentesis was performed. The catheter was removed and a dressing applied. FINDINGS: A total of approximately 700 cc of clear yellow fluid was removed. Samples were sent to the laboratory as requested by the clinical team. IMPRESSION: Successful ultrasound guided right thoracentesis yielding 700 cc of pleural fluid. Electronically Signed   By: Marybelle Killings M.D.   On: 12/08/2015 12:47    EKG:   Orders placed or performed during the hospital encounter of 12/07/15  . ED EKG  . ED EKG  . ED EKG  . ED EKG    ASSESSMENT AND PLAN:   [name] [name] is a 65 y.o. female presenting with Shortness of Breath . Admitted 12/07/2015 : Day #: 2 days 1. Acute respiratory failure with hypoxia secondary pleural effusion: Status post thoracentesis, improved symptoms, trial off of oxygenAdd Lasix as blood pressure tolerates  2. Pericardial effusion: Cardiology input appreciated, echocardiogram performed 3. Metastatic breast cancer: Oncology consult, cancer is invasive with erosion of chest wall-wound consult  PT evaluation   All the records are reviewed and case discussed with Care Management/Social Workerr. Management plans discussed with the patient, family and they are in agreement.  CODE  STATUS: dnr TOTAL TIME TAKING CARE OF THIS PATIENT: 28 minutes.   POSSIBLE D/C IN 1-2DAYS, DEPENDING ON CLINICAL CONDITION.   [name] [name],  Karenann Cai.D on 12/09/2015 at 12:57 PM  Between 7am to 6pm - Pager - [phone]  After 6pm: House Pager: - [phone]  [facility]  Office  [phone]  CC: Primary care physician; No PCP Per Patient

## 2015-12-09 NOTE — Consult Note (Addendum)
[facility]  Date of admission:  12/07/2015  Inpatient day:  12/08/2015  Consulting physician: Dr. Lance Coon   Reason for Consultation:  Metastatic cancer with new pleural and pericardial effusions  Chief Complaint: TENNILE [name] is a 65 y.o. female with metastatic breast cancer who was admitted through the emergency room with worsening shortness of breath with pleural and pericardial effusions.  HPI: The patient has metastatic breast cancer status post multiple lines of chemotherapy.   She was last seen in medical oncology clinic on 11/20/2015.  At that time, she received her first cycle of Keytruda.  CT scans from 10/20/2015 had noted interval development of a trace pericardial effusion with some pericardial nodularity along the left heart border. There was also progression of fissural fluid in the left hemithorax with increase in left pleural nodularity. She has had progression of the left anterior chest wall disease with invasion of the left pectoralis major muscle.  She tolerated her immunotherapy well.  She presented with a 3 day history of worsening shortness of breath.   Chest CT angiogram revealed no evidence of pulmonary embolism, but a moderate pericardial effusion, increased pleural fluid (small right and trace left), increasing infiltrative neoplasm on the left chest wall, and increase in pleural metastasis along the left heart border.  She underwent right sided thoracentesis today.  Symptomatically, she is feeling better.   Past Medical History:  Diagnosis Date  . Anemia   . Anxiety   . Blood transfusion without reported diagnosis   . Emphysema of lung ([place])   . History of left breast cancer , known active 2013  . Personal history of malignant neoplasm of breast     Past Surgical History:  Procedure Laterality Date  . ABDOMINAL HYSTERECTOMY  1995  . BREAST BIOPSY Left 2014  . BREAST BIOPSY Left 01-21-14  . BREAST SURGERY Right 1996    mastectomy  . mastectomy Right 1997  . NEPHRECTOMY    . oophrectomy  1980    Family History  Problem Relation Age of Onset  . Cancer Father   . Heart disease Sister   . Diabetes Sister   . Heart disease Brother   . Cancer Paternal Grandmother     breast    Social History:  reports that she has been smoking Cigarettes.  She has a 6.25 pack-year smoking history. She has never used smokeless tobacco. She reports that she does not drink alcohol or use drugs.  The patient is accompanied by her sister and a friend.  Allergies:  Allergies  Allergen Reactions  . Sulfa Antibiotics Nausea And Vomiting and Other (See Comments)    headache    Medications Prior to Admission  Medication Sig Dispense Refill  . Calcium Carb-Cholecalciferol (CALCIUM 600 + D PO) Take 1 tablet by mouth 2 (two) times daily.    Marland Kitchen KLOR-CON 10 10 MEQ tablet TAKE 1 TABLET (10 MEQ TOTAL) BY MOUTH DAILY. 30 tablet 1  . LORazepam (ATIVAN) 0.5 MG tablet Take 0.5 mg by mouth daily as needed for anxiety.   0  . ondansetron (ZOFRAN) 8 MG tablet Take 1 tablet (8 mg total) by mouth every 8 (eight) hours as needed for nausea or vomiting. 20 tablet 1  . traMADol (ULTRAM) 50 MG tablet 1 - 2 tablets (50 - 100 mg) every 6 hours as needed for pain (Patient taking differently: Take 50-100 mg by mouth every 6 (six) hours as needed for moderate pain. ) 90 tablet 0  Review of Systems: GENERAL:  Feels better s/p throacentesis.  No fevers or sweats. PERFORMANCE STATUS (ECOG):  2 HEENT:  No visual changes, runny nose, sore throat, mouth sores or tenderness. Lungs: Shortness of breath, improved.  No cough.  No hemoptysis. Cardiac:  No chest pain, palpitations, orthopnea, or PND. GI:  Appetite improved since thoracentesis.  No nausea, vomiting, diarrhea, melena or hematochezia. GU:  No urgency, frequency, dysuria, or hematuria. Musculoskeletal:  Left chest wall pain associated with disease.  No back pain.  No joint pain.  Extremities:   No pain or swelling. Skin:  Hair thin.  Chest wall lesions.  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  No headache, numbness or weakness, or coordination issues. Endocrine:  No diabetes, thyroid issues, hot flashes or night sweats. Psych:  Emotional issues secondary to family.  Pain:  Chest wall lesion pain controlled with increased dose of Tramadol. Review of systems:  All other systems reviewed and found to be negative  Physical Exam:  Blood pressure 93/60, pulse 98, temperature 98.4 F (36.9 C), temperature source Oral, resp. rate 18, height 5' 7"  (1.702 m), weight 147 lb (66.7 kg), SpO2 98 %.  GENERAL: Well developed, well nourished, woman eating lunch on the medical unit in no acute distress. MENTAL STATUS: Alert and oriented to person, place and time. HEAD: Normocephalic, atraumatic, face symmetric, no Cushingoid features. EYES: Brown eyes. Pupils equal round and reactive to light and accomodation. No conjunctivitis or scleral icterus. ENT: Oropharynx clear without lesion. Tongue normal. Mucous membranes moist.  CARDIOVASCULAR: Regular rate and rhythm without murmur, rub or gallop.  No JVD. PULMONARY:  Clear with auscultation without rales, wheezes, or rhonchi. BREAST: Coalesced deep necrotic open lesions extending over 9 cm x 7 cm (previously measured). Extensive disease beyond open areas.  Multiple nodules on upper abdominal wall.  Foul smelling. EXTREMITIES: No edema, no skin discoloration or tenderness. No palpable cords. LYMPH NODES: Left axillary ridge. No palpable cervical, supraclavicular, or inguinal adenopathy  NEUROLOGICAL: Unremarkable.  PSYCH: Appropriate.   Results for orders placed or performed during the hospital encounter of 12/07/15 (from the past 48 hour(s))  CBC with Differential     Status: Abnormal   Collection Time: 12/07/15  2:17 PM  Result Value Ref Range   WBC 8.6 3.6 - 11.0 K/uL   RBC 4.32 3.80 - 5.20 MIL/uL   Hemoglobin 13.1 12.0 - 16.0  g/dL   HCT 39.4 35.0 - 47.0 %   MCV 91.2 80.0 - 100.0 fL   MCH 30.3 26.0 - 34.0 pg   MCHC 33.2 32.0 - 36.0 g/dL   RDW 18.4 (H) 11.5 - 14.5 %   Platelets 292 150 - 440 K/uL   Neutrophils Relative % 70 %   Neutro Abs 6.1 1.4 - 6.5 K/uL   Lymphocytes Relative 17 %   Lymphs Abs 1.5 1.0 - 3.6 K/uL   Monocytes Relative 11 %   Monocytes Absolute 1.0 (H) 0.2 - 0.9 K/uL   Eosinophils Relative 1 %   Eosinophils Absolute 0.0 0 - 0.7 K/uL   Basophils Relative 1 %   Basophils Absolute 0.1 0 - 0.1 K/uL  Basic metabolic panel     Status: Abnormal   Collection Time: 12/07/15  2:17 PM  Result Value Ref Range   Sodium 135 135 - 145 mmol/L   Potassium 4.2 3.5 - 5.1 mmol/L   Chloride 100 (L) 101 - 111 mmol/L   CO2 26 22 - 32 mmol/L   Glucose, Bld  103 (H) 65 - 99 mg/dL   BUN 11 6 - 20 mg/dL   Creatinine, Ser 0.76 0.44 - 1.00 mg/dL   Calcium 9.7 8.9 - 10.3 mg/dL   GFR calc non Af Amer >60 >60 mL/min   GFR calc Af Amer >60 >60 mL/min    Comment: (NOTE) The eGFR has been calculated using the CKD EPI equation. This calculation has not been validated in all clinical situations. eGFR's persistently <60 mL/min signify possible Chronic Kidney Disease.    Anion gap 9 5 - 15  Troponin I     Status: None   Collection Time: 12/07/15  2:17 PM  Result Value Ref Range   Troponin I <0.03 <0.03 ng/mL  Basic metabolic panel     Status: Abnormal   Collection Time: 12/08/15  4:29 AM  Result Value Ref Range   Sodium 138 135 - 145 mmol/L   Potassium 3.9 3.5 - 5.1 mmol/L   Chloride 104 101 - 111 mmol/L   CO2 26 22 - 32 mmol/L   Glucose, Bld 102 (H) 65 - 99 mg/dL   BUN 7 6 - 20 mg/dL   Creatinine, Ser 0.68 0.44 - 1.00 mg/dL   Calcium 9.3 8.9 - 10.3 mg/dL   GFR calc non Af Amer >60 >60 mL/min   GFR calc Af Amer >60 >60 mL/min    Comment: (NOTE) The eGFR has been calculated using the CKD EPI equation. This calculation has not been validated in all clinical situations. eGFR's persistently <60 mL/min signify  possible Chronic Kidney Disease.    Anion gap 8 5 - 15  CBC     Status: Abnormal   Collection Time: 12/08/15  4:29 AM  Result Value Ref Range   WBC 6.9 3.6 - 11.0 K/uL   RBC 3.88 3.80 - 5.20 MIL/uL   Hemoglobin 12.1 12.0 - 16.0 g/dL   HCT 34.8 (L) 35.0 - 47.0 %   MCV 89.6 80.0 - 100.0 fL   MCH 31.0 26.0 - 34.0 pg   MCHC 34.6 32.0 - 36.0 g/dL   RDW 18.2 (H) 11.5 - 14.5 %   Platelets 275 150 - 440 K/uL  Protime-INR     Status: None   Collection Time: 12/08/15  4:29 AM  Result Value Ref Range   Prothrombin Time 13.7 11.4 - 15.2 seconds   INR 1.05   APTT     Status: None   Collection Time: 12/08/15  4:29 AM  Result Value Ref Range   aPTT 29 24 - 36 seconds  MRSA PCR Screening     Status: None   Collection Time: 12/08/15  8:59 AM  Result Value Ref Range   MRSA by PCR NEGATIVE NEGATIVE    Comment:        The GeneXpert MRSA Assay (FDA approved for NASAL specimens only), is one component of a comprehensive MRSA colonization surveillance program. It is not intended to diagnose MRSA infection nor to guide or monitor treatment for MRSA infections.   Lactate dehydrogenase (CSF, pleural or peritoneal fluid)     Status: Abnormal   Collection Time: 12/08/15 10:50 AM  Result Value Ref Range   LD, Fluid 186 (H) 3 - 23 U/L    Comment: (NOTE) Results should be evaluated in conjunction with serum values    Fluid Type-FLDH CYTOPLEU   Protein, pleural or peritoneal fluid     Status: None   Collection Time: 12/08/15 10:50 AM  Result Value Ref Range   Total protein, fluid 4.0  g/dL    Comment: (NOTE) No normal range established for this test Results should be evaluated in conjunction with serum values    Fluid Type-FTP PLEURAL     Comment: CORRECTED ON 08/28 AT 1127: PREVIOUSLY REPORTED AS CYTOPLEU  Body fluid cell count with differential     Status: Abnormal   Collection Time: 12/08/15 10:50 AM  Result Value Ref Range   Fluid Type-FCT PLEURAL     Comment: CORRECTED ON 08/28 AT  1127: PREVIOUSLY REPORTED AS CYTOPLEU   Color, Fluid YELLOW (A) YELLOW   Appearance, Fluid HAZY (A) CLEAR   WBC, Fluid 2,157 cu mm   Neutrophil Count, Fluid 8 %   Lymphs, Fluid 50 %   Monocyte-Macrophage-Serous Fluid 42 %   Eos, Fluid 0 %   Other Cells, Fluid 0 %  Body fluid culture     Status: None (Preliminary result)   Collection Time: 12/08/15 10:50 AM  Result Value Ref Range   Specimen Description PLEURAL    Special Requests NONE    Gram Stain      MODERATE WBC PRESENT,BOTH PMN AND MONONUCLEAR NO ORGANISMS SEEN Performed at [facility]    Culture PENDING    Report Status PENDING    Dg Chest 1 View  Result Date: 12/08/2015 CLINICAL DATA:  Status post right thoracentesis today. Postprocedural imaging. EXAM: CHEST 1 VIEW COMPARISON:  PA and lateral chest and CT chest 12/07/2015. FINDINGS: Small right pleural effusion seen on the comparison examination is markedly decreased. No pneumothorax. The lungs are emphysematous. Left pleural effusion including a loculated component along the major fissure is unchanged. Left chest wall masses seen on the prior CT are not well demonstrated on this exam. IMPRESSION: Negative for pneumothorax after right thoracentesis. No new abnormality. Electronically Signed   By: Inge Rise M.D.   On: 12/08/2015 11:19   Dg Chest 2 View  Result Date: 12/07/2015 CLINICAL DATA:  RIGHT-sided mastectomy 20 years prior. LEFT breast cancer recurrence. EXAM: CHEST  2 VIEW COMPARISON:  CT 10/20/2015 FINDINGS: RIGHT port noted. Stable enlarged cardiac silhouette. Increased in fullness along the LEFT suprahilar mediastinum measuring up to 5 cm. There is increase in intrafissural fluid in the LEFT lung along the oblique fissure. Bilateral small pleural effusions at the lung bases are also increased. No pulmonary edema. No focal infiltrate. IMPRESSION: 1. Increase in volume of loculated fluid along the LEFT oblique fissure. 2. Increased in basilar pleural  effusions. 3. Increased fullness in the LEFT suprahilar mediastinum is concerning for loculated fluid versus pleural metastasis. Electronically Signed   By: Suzy Bouchard M.D.   On: 12/07/2015 14:42   Ct Angio Chest Pe W And/or Wo Contrast  Result Date: 12/07/2015 CLINICAL DATA:  65 y/o F; history of breast cancer with worsening shortness of breath. EXAM: CT ANGIOGRAPHY CHEST WITH CONTRAST TECHNIQUE: Multidetector CT imaging of the chest was performed using the standard protocol during bolus administration of intravenous contrast. Multiplanar CT image reconstructions and MIPs were obtained to evaluate the vascular anatomy. CONTRAST:  75 cc Isovue 370. COMPARISON:  Chest x-ray 12/07/2015 and CT chest 10/20/2015. FINDINGS: Mediastinum/Lymph Nodes: No pulmonary embolus or thoracic aortic dissection is identified. Moderate pericardial effusion. Normal heart size. Lungs/Pleura: Small right and trace left pleural effusions. Loculated fluid tracking along the left major fissure. Moderate centrilobular emphysema with predominant upper lobe distribution. Minor dependent opacities within the lower lobes are probably atelectasis. Nodularity along the left aspect of the pericardium and left anterior costophrenic angle is partially obscured  by pleural fluid and poorly enhancing due to contrast bolus timing. Upper abdomen: No acute findings. Musculoskeletal: There is a large wound on the left chest, extensive skin thickening and multiple foci infiltrative soft tissue consistent with metastatic disease extending inferiorly below the field of view in the left lateral abdominal wall, extending to the contralateral subcutaneous fat of the right thorax, extending along the sternum, invading the left pectoralis muscle, and invading the left trapezius muscle muscle in the left lateral chest wall. The degree of soft tissue infiltration has increased from the prior CT for example a large left lateral chest wall lesion measures 54 x  20 mm axially, previously 49 x 60 mm (series 5, image 69). Review of the MIP images confirms the above findings. IMPRESSION: 1. No pulmonary embolus or thoracic aortic dissection is identified. 2. Increase in pericardial effusion, now moderate. 3. Increased pleural fluid with small right and trace left effusions. Increased loculation along the left major fissure Minor associated dependent atelectasis in the lower lobes. 4. Increasing infiltrative neoplasm centered in the left anterolateral chest wall with extension into left pectoralis muscle, left trapezius muscle, left axilla with nodularity along the anterior surface of the sternum, extending to the contralateral right chest wall and left upper abdomen wall. 5. Increased size of numerous skin nodules of anterior chest and abdomen possibly representing metastasis. 6. Possible increase in pleural metastasis along the left heart border and left anterior costophrenic sulcus. 7. Moderate emphysema. Electronically Signed   By: Kristine Garbe M.D.   On: 12/07/2015 19:01   US Thoracentesis Asp Pleural Space W/img Guide  Result Date: 12/08/2015 INDICATION: Thoracentesis EXAM: ULTRASOUND GUIDED RIGHT THORACENTESIS MEDICATIONS: None. COMPLICATIONS: None immediate. PROCEDURE: An ultrasound guided thoracentesis was thoroughly discussed with the patient and questions answered. The benefits, risks, alternatives and complications were also discussed. The patient understands and wishes to proceed with the procedure. Written consent was obtained. Ultrasound was performed to localize and mark an adequate pocket of fluid in the right chest. The area was then prepped and draped in the normal sterile fashion. 1% Lidocaine was used for local anesthesia. Under ultrasound guidance a Safe-T-Centesis catheter was introduced. Thoracentesis was performed. The catheter was removed and a dressing applied. FINDINGS: A total of approximately 700 cc of clear yellow fluid was  removed. Samples were sent to the laboratory as requested by the clinical team. IMPRESSION: Successful ultrasound guided right thoracentesis yielding 700 cc of pleural fluid. Electronically Signed   By: Marybelle Killings M.D.   On: 12/08/2015 12:47    Assessment:  The patient is a 65 y.o. woman with metastatic breast cancer s/p multiple lines of chemotherapy.  She is currently day 19 s/p cycle #1 Keytruda.  She has locally advanced disease with erosion into the chest wall, pleural and pericardial disease.  She presented with symptomatic pleural effusion.  She underwent right sided thoracentesis of 700 cc today.  Cytology is pending.  Echocardiogram reveals an EF of 45-50% with a small anterior pericardial effusion.  Symptomatically, she feels less short of breath post thoracentesis.  Plan:   1.  Oncology:  Patient currently day 71 s/p cycle #1 Keytruda.  This is her last attempt at systemic management of her aggressive breast cancer.  She has seen physicians at [facility].  Pericardial effusion is small.  Consider Pleur-X catheter if pleural effusion reaccumulates and becomes symptomatic again.  Patient may benefit from local chest wall radiation (will rediscuss with Dr. Baruch Gouty).  Palliative care consult.  2. Disposition:  Anticipate discharge home soon given limited options.   Thank you for allowing me to participate in [name] [name] 's care.  I will follow her closely with you while hospitalized and after discharge in the outpatient department.   Lequita Asal, MD  12/08/2015

## 2015-12-09 NOTE — Progress Notes (Cosign Needed)
Patient: [name] [name] / Admit Date: 12/07/2015 / Date of Encounter: 12/09/2015, 8:10 AM   Subjective: No complaints overnight. Did not sleep great. Up sitting in chair this morning. No chest pain. SOB improved. Status post thoracentesis on 8/28. Labs reviewed.   Review of Systems: Review of Systems  Constitutional: Positive for malaise/fatigue and weight loss. Negative for chills, diaphoresis and fever.  HENT: Negative for congestion.   Eyes: Negative for discharge and redness.  Respiratory: Positive for shortness of breath. Negative for cough, hemoptysis, sputum production and wheezing.   Cardiovascular: Negative for chest pain, palpitations, orthopnea, claudication, leg swelling and PND.  Gastrointestinal: Negative for abdominal pain, heartburn, nausea and vomiting.  Musculoskeletal: Negative for falls and myalgias.  Skin: Negative for rash.  Neurological: Positive for weakness. Negative for dizziness, tingling, tremors, sensory change, speech change, focal weakness and loss of consciousness.  Endo/Heme/Allergies: Does not bruise/bleed easily.  Psychiatric/Behavioral: Negative for substance abuse. The patient is not nervous/anxious.     Objective: Telemetry: sinus tachycardia, low 100's bpm, 8 beats of NSVT overnight Physical Exam: Blood pressure 93/60, pulse 98, temperature 98.4 F (36.9 C), temperature source Oral, resp. rate 18, height 5\' 7"  (1.702 m), weight 147 lb (66.7 kg), SpO2 98 %. Body mass index is 23.02 kg/m. General: Frail appearing, in no acute distress. Head: Normocephalic, atraumatic, sclera non-icteric, no xanthomas, nares are without discharge. Neck: Negative for carotid bruits. JVP not elevated. Lungs: Clear bilaterally to auscultation without wheezes, rales, or rhonchi. Breathing is unlabored. Heart: RRR S1 S2 without murmurs, rubs, or gallops.  Abdomen: Soft, non-tender, non-distended with normoactive bowel sounds. No rebound/guarding. Extremities: No  clubbing or cyanosis. No edema. Distal pedal pulses are 2+ and equal bilaterally. Neuro: Alert and oriented X 3. Moves all extremities spontaneously. Psych:  Responds to questions appropriately with a normal affect.   Intake/Output Summary (Last 24 hours) at 12/09/15 0810 Last data filed at 12/08/15 1900  Gross per 24 hour  Intake              480 ml  Output                0 ml  Net              480 ml    Inpatient Medications:  . enoxaparin (LOVENOX) injection  40 mg Subcutaneous Q24H  . feeding supplement (ENSURE ENLIVE)  237 mL Oral BID BM  . sodium chloride flush  3 mL Intravenous Q12H   Infusions:    Labs:  Recent Labs  12/07/15 1417 12/08/15 0429  NA 135 138  K 4.2 3.9  CL 100* 104  CO2 26 26  GLUCOSE 103* 102*  BUN 11 7  CREATININE 0.76 0.68  CALCIUM 9.7 9.3   No results for input(s): AST, ALT, ALKPHOS, BILITOT, PROT, ALBUMIN in the last 72 hours.  Recent Labs  12/07/15 1417 12/08/15 0429  WBC 8.6 6.9  NEUTROABS 6.1  --   HGB 13.1 12.1  HCT 39.4 34.8*  MCV 91.2 89.6  PLT 292 275    Recent Labs  12/07/15 1417  TROPONINI <0.03   Invalid input(s): POCBNP No results for input(s): HGBA1C in the last 72 hours.   Weights: Filed Weights   12/07/15 1404 12/07/15 2245  Weight: 150 lb (68 kg) 147 lb (66.7 kg)     Radiology/Studies:  Dg Chest 1 View  Result Date: 12/08/2015 IMPRESSION: Negative for pneumothorax after right thoracentesis. No new abnormality. Electronically Signed  By: Inge Rise M.D.   On: 12/08/2015 11:19   Dg Chest 2 View  Result Date: 12/07/2015 IMPRESSION: 1. Increase in volume of loculated fluid along the LEFT oblique fissure. 2. Increased in basilar pleural effusions. 3. Increased fullness in the LEFT suprahilar mediastinum is concerning for loculated fluid versus pleural metastasis. Electronically Signed   By: Suzy Bouchard M.D.   On: 12/07/2015 14:42   Ct Angio Chest Pe W And/or Wo Contrast  Result Date:  12/07/2015 IMPRESSION: 1. No pulmonary embolus or thoracic aortic dissection is identified. 2. Increase in pericardial effusion, now moderate. 3. Increased pleural fluid with small right and trace left effusions. Increased loculation along the left major fissure Minor associated dependent atelectasis in the lower lobes. 4. Increasing infiltrative neoplasm centered in the left anterolateral chest wall with extension into left pectoralis muscle, left trapezius muscle, left axilla with nodularity along the anterior surface of the sternum, extending to the contralateral right chest wall and left upper abdomen wall. 5. Increased size of numerous skin nodules of anterior chest and abdomen possibly representing metastasis. 6. Possible increase in pleural metastasis along the left heart border and left anterior costophrenic sulcus. 7. Moderate emphysema. Electronically Signed   By: Kristine Garbe M.D.   On: 12/07/2015 19:01   US Thoracentesis Asp Pleural Space W/img Guide  Result Date: 12/08/2015 INDICATION: Thoracentesis EXAM: ULTRASOUND GUIDED RIGHT THORACENTESIS MEDICATIONS: None. COMPLICATIONS: None immediate. PROCEDURE: An ultrasound guided thoracentesis was thoroughly discussed with the patient and questions answered. The benefits, risks, alternatives and complications were also discussed. The patient understands and wishes to proceed with the procedure. Written consent was obtained. Ultrasound was performed to localize and mark an adequate pocket of fluid in the right chest. The area was then prepped and draped in the normal sterile fashion. 1% Lidocaine was used for local anesthesia. Under ultrasound guidance a Safe-T-Centesis catheter was introduced. Thoracentesis was performed. The catheter was removed and a dressing applied. FINDINGS: A total of approximately 700 cc of clear yellow fluid was removed. Samples were sent to the laboratory as requested by the clinical team. IMPRESSION: Successful  ultrasound guided right thoracentesis yielding 700 cc of pleural fluid. Electronically Signed   By: Marybelle Killings M.D.   On: 12/08/2015 12:47     Assessment and Plan  Principal Problem:   Pleural effusion Active Problems:   Systolic dysfunction   Metastatic breast cancer (HCC)   Anxiety   Pericardial effusion   Protein-calorie malnutrition, severe    1. Pericardial effusion: -Initially reported as trace on 10/20/15. Now reported as moderate in size from CTA chest 12/07/15 -Recent echo from 11/20/15 without evidence of pericardial effusion -Recheck echo on 8/28 showed a small anterior pericardial effusion without need for intervention at this time -No evidence of pulsus paradoxus  -Continue to monitor   2. Bilateral pleural effusions: -Status post thoracentesis on 8/28 -breathing better -Consider low-dose Lasix vs Pleur-X catheter if pleural effusion reaccumulates with symptoms -Likely in the setting of her cancer  3. Recurrent malignant breast cancer s/p mastectomy in 1998 s/p radiation and chemo: -Has been seen by [facility] for 2nd opinion without recommendation of further invasive intervention -Per oncology   4. Systolic dysfunction/NSVT: -BP too soft for the addition of beta blocker at this time -Start beta blocker when/if able -No indication for amiodarone at this time  Signed, Marcille Blanco [place] Pager: [phone] 12/09/2015, 8:10 AM

## 2015-12-10 ENCOUNTER — Inpatient Hospital Stay: Payer: Medicare Other

## 2015-12-10 LAB — CYTOLOGY - NON PAP

## 2015-12-10 MED ORDER — ENSURE ENLIVE PO LIQD: 237.0000 mL | Freq: Two times a day (BID) | ORAL | 12 refills | Status: AC

## 2015-12-10 NOTE — Evaluation (Signed)
Physical Therapy Evaluation Patient Details Name: [name] [name] MRN: [id number] DOB: [birth year] Today's Date: 12/10/2015   History of Present Illness  Pt is a 65 y.o. female presenting with 3 days worsening SOB.  Pt admitted with acute respiratory failure with hypoxia secondary to pelural effusion; pericardial effusion; and fungating breast tumor L breast/lateral chest.  Pt s/p thoracentesis 12/08/15.  PMH includes metastatic L breast CA, anemia, and neuropathy.  Clinical Impression  Pt demonstrates steady independent functional mobility without AD.  No loss of balance noted with ambulation and head turns R/L/up/down, increasing/decreasing speed, and turning and stopping; no loss of balance with any functional mobility noted during session.  Pt appears safe to discharge home.  No further PT needs identified in the hospital or upon discharge.    Follow Up Recommendations No PT follow up    Equipment Recommendations  None recommended by PT    Recommendations for Other Services       Precautions / Restrictions Precautions Precautions: Fall Precaution Comments: Metastatic disease Restrictions Weight Bearing Restrictions: No      Mobility  Bed Mobility Overal bed mobility: Independent                Transfers Overall transfer level: Independent Equipment used: None             General transfer comment: steady strong stand without loss of balance  Ambulation/Gait Ambulation/Gait assistance: Independent Ambulation Distance (Feet): 220 Feet Assistive device: None Gait Pattern/deviations: WFL(Within Functional Limits)   Gait velocity interpretation: >2.62 ft/sec, indicative of independent Recruitment consultant    Modified Rankin (Stroke Patients Only)       Balance Overall balance assessment: Independent    Static standing balance with NBOS and WBOS (eyes open and closed) steady without loss of balance x10  seconds each.                                       Pertinent Vitals/Pain Pain Assessment: No/denies pain  See flow sheet for HR and O2 vitals.    Home Living Family/patient expects to be discharged to:: Private residence Living Arrangements: Alone (Rents out 1 room)   Type of Home: House Home Access: Level entry     Home Layout: One level Home Equipment: None      Prior Function Level of Independence: Independent         Comments: Pt denies any falls in past 6 months.  (+) driving     Hand Dominance        Extremity/Trunk Assessment   Upper Extremity Assessment: Overall WFL for tasks assessed           Lower Extremity Assessment: Overall WFL for tasks assessed      Cervical / Trunk Assessment: Normal  Communication   Communication: No difficulties  Cognition Arousal/Alertness: Awake/alert Behavior During Therapy: WFL for tasks assessed/performed Overall Cognitive Status: Within Functional Limits for tasks assessed                      General Comments General comments (skin integrity, edema, etc.): Pt laying in bed resting upon PT arrival.  Nursing cleared pt for participation in physical therapy.  Pt agreeable to PT session.    Exercises        Assessment/Plan    PT  Assessment Patent does not need any further PT services  PT Diagnosis     PT Problem List    PT Treatment Interventions     PT Goals (Current goals can be found in the Care Plan section) Acute Rehab PT Goals Patient Stated Goal: to go home PT Goal Formulation: With patient Time For Goal Achievement: 12/24/15 Potential to Achieve Goals: Good    Frequency     Barriers to discharge        Co-evaluation               End of Session Equipment Utilized During Treatment: Gait belt Activity Tolerance: Patient tolerated treatment well Patient left: in bed;with call bell/phone within reach;with family/visitor present Nurse Communication: Mobility  status         Time: [id number] PT Time Calculation (min) (ACUTE ONLY): 10 min   Charges:   PT Evaluation $PT Eval Low Complexity: 1 Procedure     PT G CodesLeitha Bleak 2016-01-04, 11:44 AM Leitha Bleak, [place]

## 2015-12-10 NOTE — Discharge Instructions (Signed)
Pleural Effusion °A pleural effusion is an abnormal buildup of fluid in the layers of tissue between your lungs and the inside of your chest (pleural space). These two layers of tissue that line both your lungs and the inside of your chest are called pleura. Usually, there is no air in the space between the pleura, only a thin layer of fluid. If left untreated, a large amount of fluid can build up and cause the lung to collapse. A pleural effusion is usually caused by another disease that requires treatment. °The two main types of pleural effusion are: °· Transudative pleural effusion. This happens when fluid leaks into the pleural space because of a low protein count in your blood or high blood pressure in your vessels. Heart failure often causes this. °· Exudative infusion. This occurs when fluid collects in the pleural space from blocked blood vessels or lymph vessels. Some lung diseases, injuries, and cancers can cause this type of effusion. °CAUSES °Pleural effusion can be caused by: °· Heart failure. °· A blood clot in the lung (pulmonary embolism). °· Pneumonia. °· Cancer. °· Liver failure (cirrhosis). °· Kidney disease. °· Complications from surgery, such as from open heart surgery. °SIGNS AND SYMPTOMS °In some cases, pleural effusion may cause no symptoms. Symptoms can include: °· Shortness of breath, especially when lying down. °· Chest pain, often worse when taking a deep breath. °· Fever. °· Dry cough that is lasting (chronic). °· Hiccups. °· Rapid breathing. °An underlying condition that is causing the pleural effusion (such as heart failure, pneumonia, blood clots, tuberculosis, or cancer) may also cause additional symptoms. °DIAGNOSIS °Your health care provider may suspect pleural effusion based on your symptoms and medical history. Your health care provider will also do a physical exam and a chest X-ray. If the X-ray shows there is fluid in your chest, you may need to have this fluid removed using a  needle (thoracentesis) so it can be tested. °You may also have: °· Imaging studies of the chest, such as: °¨ Ultrasound. °¨ CT scan. °· Blood tests for kidney and liver function. °TREATMENT °Treatment depends on the cause of the pleural effusion. Treatment may include: °· Taking antibiotic medicines to clear up an infection that is causing the pleural effusion. °· Placing a tube in the chest to drain the effusion (tube thoracostomy). This procedure is often used when there is an infection in the fluid. °· Surgery to remove the fibrous outer layer of tissue from the pleural space (decortication). °· Thoracentesis, which can improve cough and shortness of breath. °· A procedure to put medicine into the chest cavity to seal the pleural space to prevent fluid buildup (pleurodesis). °· Chemotherapy and radiation therapy. These may be required in the case of cancerous (malignant) pleural effusion. °HOME CARE INSTRUCTIONS °· Take medicines only as directed by your health care provider. °· Keep track of how long you can gently exercise before you get short of breath. Try simply walking at first. °· Do not use any tobacco products, including cigarettes, chewing tobacco, or electronic cigarettes. If you need help quitting, ask your health care provider. °· Keep all follow-up visits as directed by your health care provider. This is important. °SEEK MEDICAL CARE IF: °· The amount of time that you are able to exercise decreases or does not improve with time. °· You have pain or signs of infection at the puncture site if you had thoracentesis. Watch for: °¨ Drainage. °¨ Redness. °¨ Swelling. °· You have a fever. °  SEEK IMMEDIATE MEDICAL CARE IF: °· You are short of breath. °· You develop chest pain. °· You develop a new cough. °MAKE SURE YOU: °· Understand these instructions. °· Will watch your condition. °· Will get help right away if you are not doing well or get worse. °  °This information is not intended to replace advice  given to you by your health care provider. Make sure you discuss any questions you have with your health care provider. °  °Document Released: 03/29/2005 Document Revised: 04/19/2014 Document Reviewed: 08/22/2013 °Elsevier Interactive Patient Education ©2016 [employer] ° °

## 2015-12-10 NOTE — Progress Notes (Signed)
MD making rounds. Received orders to discharge home. IV removed. Prescription E-Prescribed to pharmacy. Discharge paperwork provided, explained, signed and witnessed. No unanswered questions. Discharged via wheelchair by [employer]. Belongings sent with patient.

## 2015-12-10 NOTE — Discharge Summary (Signed)
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 Anemia   . Anxiety   . Blood transfusion without reported diagnosis   . Emphysema of lung ([place])   . History of left breast cancer , known active 2013  . Personal history of malignant neoplasm of breast     HOSPITAL COURSE:  [name] [name]  is a 65 y.o. female admitted 12/07/2015 with chief complaint Shortness of Breath . Please see H&P performed by Lance Coon, MD for further information. Patient Presented with the above symptoms. Found to have pericardial and pleural fusions. Cardiology consult obtained for pericardial effusion-deemed not hemodynamically significant. Pleural effusion-underwent thoracentesis with improvement of symptoms. She was also evaluated by oncology during her stay Who continue to follow as outpatient. It was proposed to start her on Lasix however her blood pressure was fairly low and unable to tolerate.  DISCHARGE CONDITIONS:   Stable  CONSULTS OBTAINED:  Treatment Team:  Lequita Asal, MD Wellington Hampshire, MD  DRUG ALLERGIES:   Allergies  Allergen Reactions  . Sulfa Antibiotics Nausea And Vomiting and Other (See Comments)    headache    DISCHARGE MEDICATIONS:   Current Discharge Medication List    START taking  these medications   Details  feeding supplement, ENSURE ENLIVE, (ENSURE ENLIVE) LIQD Take 237 mLs by mouth 2 (two) times daily between meals. Qty: 237 mL, Refills: 12      CONTINUE these medications which have NOT CHANGED   Details  Calcium Carb-Cholecalciferol (CALCIUM 600 + D PO) Take 1 tablet by mouth 2 (two) times daily.    KLOR-CON 10 10 MEQ tablet TAKE 1 TABLET (10 MEQ TOTAL) BY MOUTH DAILY. Qty: 30 tablet, Refills: 1    LORazepam (ATIVAN) 0.5 MG tablet Take 0.5 mg by mouth daily as needed for anxiety.  Refills: 0    ondansetron (ZOFRAN) 8 MG tablet Take 1 tablet (8 mg total) by mouth every 8 (eight) hours as needed for nausea or vomiting. Qty: 20 tablet, Refills: 1    traMADol (ULTRAM) 50 MG tablet 1 - 2 tablets (50 - 100 mg) every 6 hours as needed for pain Qty: 90 tablet, Refills: 0         DISCHARGE INSTRUCTIONS:    DIET:  Regular diet  DISCHARGE CONDITION:  Stable  ACTIVITY:  Activity as tolerated  OXYGEN:  Home Oxygen: No.   Oxygen Delivery: room air  DISCHARGE LOCATION:  home   If you experience worsening of your admission symptoms, develop shortness of breath, life threatening emergency, suicidal or homicidal thoughts you must seek medical attention immediately by calling 911 or calling your MD immediately  if symptoms less severe.  You Must read complete instructions/literature along with all the possible adverse reactions/side effects for all the Medicines you take and that have been prescribed to you. Take any  new Medicines after you have completely understood and accpet all the possible adverse reactions/side effects.   Please note  You were cared for by a hospitalist during your hospital stay. If you have any questions about your discharge medications or the care you received while you were in the hospital after you are discharged, you can call the unit and asked to speak with the hospitalist on call if the hospitalist that took care of you is  not available. Once you are discharged, your primary care physician will handle any further medical issues. Please note that NO REFILLS for any discharge medications will be authorized once you are discharged, as it is imperative that you return to your primary care physician (or establish a relationship with a primary care physician if you do not have one) for your aftercare needs so that they can reassess your need for medications and monitor your lab values.    On the day of Discharge:   VITAL SIGNS:  Blood pressure 94/61, pulse (!) 114, temperature 98.9 F (37.2 C), temperature source Oral, resp. rate 18, height 5\' 7"  (1.702 m), weight 66.7 kg (147 lb), SpO2 94 %.  I/O:   Intake/Output Summary (Last 24 hours) at 12/10/15 0930 Last data filed at 12/09/15 1300  Gross per 24 hour  Intake              240 ml  Output                0 ml  Net              240 ml    PHYSICAL EXAMINATION:  GENERAL:  65 y.o.-year-old patient lying in the bed with no acute distress.  EYES: Pupils equal, round, reactive to light and accommodation. No scleral icterus. Extraocular muscles intact.  HEENT: Head atraumatic, normocephalic. Oropharynx and nasopharynx clear.  NECK:  Supple, no jugular venous distention. No thyroid enlargement, no tenderness.  LUNGS: Normal breath sounds bilaterally, no wheezing, rales,rhonchi or crepitation. No use of accessory muscles of respiration.  CARDIOVASCULAR: S1, S2 normal. No murmurs, rubs, or gallops.  ABDOMEN: Soft, non-tender, non-distended. Bowel sounds present. No organomegaly or mass.  EXTREMITIES: No pedal edema, cyanosis, or clubbing.  NEUROLOGIC: Cranial nerves II through XII are intact. Muscle strength 5/5 in all extremities. Sensation intact. Gait not checked.  PSYCHIATRIC: The patient is alert and oriented x 3.  SKIN: No obvious rash, lesion, or ulcer.   DATA REVIEW:   CBC  Recent Labs Lab 12/08/15 0429  WBC 6.9  HGB 12.1  HCT 34.8*  PLT 275     Chemistries   Recent Labs Lab 12/08/15 0429  NA 138  K 3.9  CL 104  CO2 26  GLUCOSE 102*  BUN 7  CREATININE 0.68  CALCIUM 9.3    Cardiac Enzymes  Recent Labs Lab 12/07/15 1417  TROPONINI <0.03    Microbiology Results  Results for orders placed or performed during the hospital encounter of 12/07/15  MRSA PCR Screening     Status: None   Collection Time: 12/08/15  8:59 AM  Result Value Ref Range Status   MRSA by PCR NEGATIVE NEGATIVE Final    Comment:        The GeneXpert MRSA Assay (FDA approved for NASAL specimens only), is one component of a comprehensive MRSA colonization surveillance program. It is not intended to diagnose MRSA infection nor to guide or monitor treatment for MRSA infections.   Body fluid culture  Status: None (Preliminary result)   Collection Time: 12/08/15 10:50 AM  Result Value Ref Range Status   Specimen Description PLEURAL  Final   Special Requests NONE  Final   Gram Stain   Final    MODERATE WBC PRESENT,BOTH PMN AND MONONUCLEAR NO ORGANISMS SEEN    Culture   Final    NO GROWTH < 24 HOURS Performed at [facility]    Report Status PENDING  Incomplete    RADIOLOGY:  Dg Chest 1 View  Result Date: 12/08/2015 CLINICAL DATA:  Status post right thoracentesis today. Postprocedural imaging. EXAM: CHEST 1 VIEW COMPARISON:  PA and lateral chest and CT chest 12/07/2015. FINDINGS: Small right pleural effusion seen on the comparison examination is markedly decreased. No pneumothorax. The lungs are emphysematous. Left pleural effusion including a loculated component along the major fissure is unchanged. Left chest wall masses seen on the prior CT are not well demonstrated on this exam. IMPRESSION: Negative for pneumothorax after right thoracentesis. No new abnormality. Electronically Signed   By: Inge Rise M.D.   On: 12/08/2015 11:19   Dg Chest Port 1 View  Result Date: 12/10/2015 CLINICAL DATA:  Shortness of breath.  EXAM: PORTABLE CHEST 1 VIEW COMPARISON:  December 08, 2015 chest radiograph and chest CT December 07, 2015 FINDINGS: Port-A-Cath tip is in the superior vena cava,, stable. No pneumothorax. There is new patchy infiltrate in the right base. These sizable mass in the left mid lung region is stable. There is diffuse interstitial prominence on the left, indicative of asymmetric edema. Heart size is normal. There is a small left effusion which appears at least partially loculated. Soft tissue prominence in the left perihilar region remains which may be due to pericardial effusion based on the recent CT appearance. No change in the mediastinal or hilar contours. No bone lesions evident. There are surgical clips in the right axilla. IMPRESSION: New patchy infiltrate right base. Mass with asymmetric edema on the left appears stable. Small partially loculated left effusion. Soft tissue prominence the left perihilar region is stable. Question pericardial effusion based on CT appearance. No pneumothorax. Electronically Signed   By: Lowella Grip III M.D.   On: 12/10/2015 07:59   US Thoracentesis Asp Pleural Space W/img Guide  Result Date: 12/08/2015 INDICATION: Thoracentesis EXAM: ULTRASOUND GUIDED RIGHT THORACENTESIS MEDICATIONS: None. COMPLICATIONS: None immediate. PROCEDURE: An ultrasound guided thoracentesis was thoroughly discussed with the patient and questions answered. The benefits, risks, alternatives and complications were also discussed. The patient understands and wishes to proceed with the procedure. Written consent was obtained. Ultrasound was performed to localize and mark an adequate pocket of fluid in the right chest. The area was then prepped and draped in the normal sterile fashion. 1% Lidocaine was used for local anesthesia. Under ultrasound guidance a Safe-T-Centesis catheter was introduced. Thoracentesis was performed. The catheter was removed and a dressing applied. FINDINGS: A total of approximately  700 cc of clear yellow fluid was removed. Samples were sent to the laboratory as requested by the clinical team. IMPRESSION: Successful ultrasound guided right thoracentesis yielding 700 cc of pleural fluid. Electronically Signed   By: Marybelle Killings M.D.   On: 12/08/2015 12:47     Management plans discussed with the patient, family and they are in agreement.  CODE STATUS:     Code Status Orders        Start     Ordered   12/07/15 2245  Do not attempt resuscitation (DNR)  Continuous  Question Answer Comment  In the event of cardiac or respiratory ARREST Do not call a "code blue"   In the event of cardiac or respiratory ARREST Do not perform Intubation, CPR, defibrillation or ACLS   In the event of cardiac or respiratory ARREST Use medication by any route, position, wound care, and other measures to relive pain and suffering. May use oxygen, suction and manual treatment of airway obstruction as needed for comfort.      12/07/15 2244    Code Status History    Date Active Date Inactive Code Status Order ID Comments User Context   12/07/2015 10:44 PM 12/08/2015  8:27 AM DNR [id number]  Lance Coon, MD Inpatient   12/07/2015  4:59 PM 12/07/2015 10:44 PM DNR [id number]  Merlyn Lot, MD ED    Questions for Most Recent Historical Code Status (Order [id number])    Question Answer Comment   In the event of cardiac or respiratory ARREST Do not call a "code blue"    In the event of cardiac or respiratory ARREST Do not perform Intubation, CPR, defibrillation or ACLS    In the event of cardiac or respiratory ARREST Use medication by any route, position, wound care, and other measures to relive pain and suffering. May use oxygen, suction and manual treatment of airway obstruction as needed for comfort.       TOTAL TIME TAKING CARE OF THIS PATIENT: 33 minutes.    Hower,  Karenann Cai.D on 12/10/2015 at 9:30 AM  Between 7am to 6pm - Pager - [phone]  After 6pm go to [email or web address] - [employer] [facility]  Office  [phone]  CC: Primary care physician; No PCP Per Patient

## 2015-12-11 ENCOUNTER — Encounter: Payer: Self-pay | Admitting: Hematology and Oncology

## 2015-12-11 ENCOUNTER — Inpatient Hospital Stay: Payer: Medicare Other

## 2015-12-11 ENCOUNTER — Inpatient Hospital Stay (HOSPITAL_BASED_OUTPATIENT_CLINIC_OR_DEPARTMENT_OTHER): Payer: Medicare Other | Admitting: Hematology and Oncology

## 2015-12-11 VITALS — BP 105/73 | HR 111 | Temp 97.9°F | Resp 18 | Wt 137.5 lb

## 2015-12-11 DIAGNOSIS — F17210 Nicotine dependence, cigarettes, uncomplicated: Secondary | ICD-10-CM

## 2015-12-11 DIAGNOSIS — Z853 Personal history of malignant neoplasm of breast: Secondary | ICD-10-CM

## 2015-12-11 DIAGNOSIS — G629 Polyneuropathy, unspecified: Secondary | ICD-10-CM

## 2015-12-11 DIAGNOSIS — Z171 Estrogen receptor negative status [ER-]: Secondary | ICD-10-CM

## 2015-12-11 DIAGNOSIS — C50112 Malignant neoplasm of central portion of left female breast: Secondary | ICD-10-CM

## 2015-12-11 DIAGNOSIS — Z9221 Personal history of antineoplastic chemotherapy: Secondary | ICD-10-CM | POA: Diagnosis not present

## 2015-12-11 DIAGNOSIS — C50912 Malignant neoplasm of unspecified site of left female breast: Secondary | ICD-10-CM | POA: Diagnosis not present

## 2015-12-11 DIAGNOSIS — I313 Pericardial effusion (noninflammatory): Secondary | ICD-10-CM | POA: Diagnosis not present

## 2015-12-11 DIAGNOSIS — R634 Abnormal weight loss: Secondary | ICD-10-CM

## 2015-12-11 DIAGNOSIS — Z9011 Acquired absence of right breast and nipple: Secondary | ICD-10-CM | POA: Diagnosis not present

## 2015-12-11 DIAGNOSIS — Z79899 Other long term (current) drug therapy: Secondary | ICD-10-CM | POA: Diagnosis not present

## 2015-12-11 DIAGNOSIS — I312 Hemopericardium, not elsewhere classified: Secondary | ICD-10-CM | POA: Diagnosis not present

## 2015-12-11 DIAGNOSIS — G893 Neoplasm related pain (acute) (chronic): Secondary | ICD-10-CM

## 2015-12-11 DIAGNOSIS — C50919 Malignant neoplasm of unspecified site of unspecified female breast: Secondary | ICD-10-CM

## 2015-12-11 DIAGNOSIS — I10 Essential (primary) hypertension: Secondary | ICD-10-CM | POA: Diagnosis not present

## 2015-12-11 DIAGNOSIS — J439 Emphysema, unspecified: Secondary | ICD-10-CM | POA: Diagnosis not present

## 2015-12-11 DIAGNOSIS — F419 Anxiety disorder, unspecified: Secondary | ICD-10-CM | POA: Diagnosis not present

## 2015-12-11 DIAGNOSIS — E43 Unspecified severe protein-calorie malnutrition: Secondary | ICD-10-CM

## 2015-12-11 LAB — CBC WITH DIFFERENTIAL/PLATELET
Basophils Absolute: 0.1 10*3/uL (ref 0–0.1)
Basophils Relative: 1 %
Eosinophils Absolute: 0.2 10*3/uL (ref 0–0.7)
Eosinophils Relative: 2 %
HCT: 35.4 % (ref 35.0–47.0)
Hemoglobin: 12 g/dL (ref 12.0–16.0)
Lymphocytes Relative: 23 %
Lymphs Abs: 1.8 10*3/uL (ref 1.0–3.6)
MCH: 30.2 pg (ref 26.0–34.0)
MCHC: 33.8 g/dL (ref 32.0–36.0)
MCV: 89.4 fL (ref 80.0–100.0)
Monocytes Absolute: 1 10*3/uL — ABNORMAL HIGH (ref 0.2–0.9)
Monocytes Relative: 13 %
Neutro Abs: 4.9 10*3/uL (ref 1.4–6.5)
Neutrophils Relative %: 61 %
Platelets: 300 10*3/uL (ref 150–440)
RBC: 3.96 MIL/uL (ref 3.80–5.20)
RDW: 18.2 % — ABNORMAL HIGH (ref 11.5–14.5)
WBC: 8 10*3/uL (ref 3.6–11.0)

## 2015-12-11 LAB — COMPREHENSIVE METABOLIC PANEL
ALT: 17 U/L (ref 14–54)
AST: 29 U/L (ref 15–41)
Albumin: 3.6 g/dL (ref 3.5–5.0)
Alkaline Phosphatase: 78 U/L (ref 38–126)
Anion gap: 12 (ref 5–15)
BUN: 14 mg/dL (ref 6–20)
CO2: 25 mmol/L (ref 22–32)
Calcium: 9.1 mg/dL (ref 8.9–10.3)
Chloride: 98 mmol/L — ABNORMAL LOW (ref 101–111)
Creatinine, Ser: 0.83 mg/dL (ref 0.44–1.00)
GFR calc Af Amer: >60 mL/min (ref >60)
GFR calc non Af Amer: >60 mL/min (ref >60)
Glucose, Bld: 98 mg/dL (ref 65–99)
Potassium: 3.6 mmol/L (ref 3.5–5.1)
Sodium: 135 mmol/L (ref 135–145)
Total Bilirubin: 0.3 mg/dL (ref 0.3–1.2)
Total Protein: 7.7 g/dL (ref 6.5–8.1)

## 2015-12-11 LAB — BODY FLUID CULTURE: Culture: NO GROWTH

## 2015-12-11 LAB — TSH: TSH: 4.67 u[IU]/mL — ABNORMAL HIGH (ref 0.350–4.500)

## 2015-12-11 MED ORDER — MEGESTROL ACETATE 400 MG/10ML PO SUSP: 200.0000 mg | Freq: Every day | ORAL | 1 refills | Status: AC

## 2015-12-11 NOTE — Patient Instructions (Signed)
You have an appointment with Dr. Nestor Lewandowsky on September 5 at 9 am .  The office is located in [facility] and [address].

## 2015-12-11 NOTE — Progress Notes (Signed)
[facility] day:  12/11/15   Chief Complaint: [name] [name] is a 65 y.o. female with metastatic breast cancer who is seen for reassessment after interval hospitalization.  HPI: The patient was last seen in the medical oncology clinic on 11/20/2015.  At that time, she she received cycle #1 Keytruda.  She tolerated her infusion well.    She was admitted to [facility] from 12/07/2015 - 12/10/2015.  She presented with a 3 day history of worsening shortness of breath.   Chest CT angiogram revealed no evidence of pulmonary embolism, but a moderate pericardial effusion, increased pleural fluid (small right and trace left), increasing infiltrative neoplasm on the left chest wall, and increase in pleural metastasis along the left heart border.  She underwent right sided thoracentesis on 12/08/2015.  700 cc were removed.  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  Tumor was ER positive.  Echo revealed an EF of 45-50% with a small anterior pericardial effusion.  Symptomatically, she is fatigued.  She notes not much of an appetite.  She notes shortness of breath with walking.  Oxygen saturation has been stable (97 - 100%).   Past Medical History:  Diagnosis Date  . Anemia   . Anxiety   . Blood transfusion without reported diagnosis   . Emphysema of lung ([place])   . History of left breast cancer , known active 2013  . Personal history of malignant neoplasm of breast     Past Surgical History:  Procedure Laterality Date  . ABDOMINAL HYSTERECTOMY  1995  . BREAST BIOPSY Left 2014  . BREAST BIOPSY Left 01-21-14  . BREAST SURGERY Right 1996   mastectomy  . mastectomy Right 1997  . NEPHRECTOMY    . oophrectomy  1980    Family History  Problem Relation Age of Onset  . Cancer Father   . Heart disease Sister   . Diabetes Sister   . Heart disease Brother   . Cancer Paternal Grandmother     breast    Social  History:  reports that she has been smoking Cigarettes.  She has a 6.25 pack-year smoking history. She has never used smokeless tobacco. She reports that she does not drink alcohol or use drugs.  She is smoking 1/2 pack per day.  She is accompanied by her sister, Rip Harbour, today.  Allergies:  Allergies  Allergen Reactions  . Sulfa Antibiotics Nausea And Vomiting and Other (See Comments)    headache    Current Medications: Current Outpatient Prescriptions  Medication Sig Dispense Refill  . Calcium Carb-Cholecalciferol (CALCIUM 600 + D PO) Take 1 tablet by mouth 2 (two) times daily.    . feeding supplement, ENSURE ENLIVE, (ENSURE ENLIVE) LIQD Take 237 mLs by mouth 2 (two) times daily between meals. 237 mL 12  . KLOR-CON 10 10 MEQ tablet TAKE 1 TABLET (10 MEQ TOTAL) BY MOUTH DAILY. 30 tablet 1  . traMADol (ULTRAM) 50 MG tablet 1 - 2 tablets (50 - 100 mg) every 6 hours as needed for pain (Patient taking differently: Take 50-100 mg by mouth every 6 (six) hours as needed for moderate pain. ) 90 tablet 0  . LORazepam (ATIVAN) 0.5 MG tablet Take 0.5 mg by mouth daily as needed for anxiety.   0  . ondansetron (ZOFRAN) 8 MG tablet Take 1 tablet (8 mg total) by mouth every 8 (eight) hours as needed for nausea or vomiting. (Patient not taking: Reported on 12/11/2015) 20 tablet  1   No current facility-administered medications for this visit.    Facility-Administered Medications Ordered in Other Visits  Medication Dose Route Frequency Provider Last Rate Last Dose  . sodium chloride 0.9 % injection 10 mL  10 mL Intravenous PRN Lequita Asal, MD   10 mL at 10/08/14 1335  . sodium chloride 0.9 % injection 10 mL  10 mL Intracatheter PRN Lequita Asal, MD   10 mL at 11/26/14 1355    Review of Systems:  GENERAL:  Feels "ok".  No fevers or sweats.  Weight down 15 pounds. PERFORMANCE STATUS (ECOG):  2 HEENT:  No visual changes, runny nose, sore throat, mouth sores or tenderness. Lungs:  Shortness of  breath with exertion.  No cough.  No hemoptysis. O2 sats normal. Cardiac:  No chest pain, palpitations, orthopnea, or PND. GI:  Appetite poor.  No nausea, vomiting, diarrhea, melena or hematochezia. GU:  No urgency, frequency, dysuria, or hematuria. Musculoskeletal:  No back pain.  No joint pain.  No muscle tenderness.  Interval muscle cramps. Extremities:  No pain or swelling. Skin:  Hair thin.  Chest wall lesions enlarging.  Neuro:  No headache, numbness or weakness, or coordination issues. Endocrine:  No diabetes, thyroid issues, hot flashes or night sweats. Pain:  Chest wall lesion pain well controlled with Tramadol. Review of systems:  All other systems reviewed and found to be negative.   Physical Exam: Blood pressure 105/73, pulse (!) 111, temperature 97.9 F (36.6 C), temperature source Tympanic, resp. rate 18, weight 137 lb 7.3 oz (62.4 kg), SpO2 97 %.  GENERAL: Chronically fatigued appearing woman sitting in the exam room in no acute distress. MENTAL STATUS: Alert and oriented to person, place and time. HEAD: Short gray wig. Normocephalic, atraumatic, face symmetric, no Cushingoid features. EYES: Brown eyes. Pupils equal round and reactive to light and accomodation. No conjunctivitis or scleral icterus. ENT: Oropharynx clear without lesion. Tongue normal. Mucous membranes moist.  NECK: Left neck cyst (stable). BREAST: Dressing covering left anterior chest wall (partially rremoved).  Oozing at one site.  Coalesced deep necrotic open lesions (foul smelling). Extensive cutaneous disease beyond open areas.  Multiple dark nodules on upper abdominal wall. SKIN: Cutaneous breast cancer noted in breast section. No other lesions.  EXTREMITIES: No edema, no skin discoloration or tenderness. No palpable cords. LYMPH NODES: Left thick axillary ridge. No palpable cervical, supraclavicular, or inguinal adenopathy  NEUROLOGICAL: Unremarkable.  PSYCH:  Appropriate.   Appointment on 12/11/2015  Component Date Value Ref Range Status  . WBC 12/11/2015 8.0  3.6 - 11.0 K/uL Final  . RBC 12/11/2015 3.96  3.80 - 5.20 MIL/uL Final  . Hemoglobin 12/11/2015 12.0  12.0 - 16.0 g/dL Final  . HCT 12/11/2015 35.4  35.0 - 47.0 % Final  . MCV 12/11/2015 89.4  80.0 - 100.0 fL Final  . MCH 12/11/2015 30.2  26.0 - 34.0 pg Final  . MCHC 12/11/2015 33.8  32.0 - 36.0 g/dL Final  . RDW 12/11/2015 18.2* 11.5 - 14.5 % Final  . Platelets 12/11/2015 300  150 - 440 K/uL Final  . Neutrophils Relative % 12/11/2015 61  % Final  . Neutro Abs 12/11/2015 4.9  1.4 - 6.5 K/uL Final  . Lymphocytes Relative 12/11/2015 23  % Final  . Lymphs Abs 12/11/2015 1.8  1.0 - 3.6 K/uL Final  . Monocytes Relative 12/11/2015 13  % Final  . Monocytes Absolute 12/11/2015 1.0* 0.2 - 0.9 K/uL Final  . Eosinophils Relative 12/11/2015 2  %  Final  . Eosinophils Absolute 12/11/2015 0.2  0 - 0.7 K/uL Final  . Basophils Relative 12/11/2015 1  % Final  . Basophils Absolute 12/11/2015 0.1  0 - 0.1 K/uL Final  . Sodium 12/11/2015 135  135 - 145 mmol/L Final  . Potassium 12/11/2015 3.6  3.5 - 5.1 mmol/L Final  . Chloride 12/11/2015 98* 101 - 111 mmol/L Final  . CO2 12/11/2015 25  22 - 32 mmol/L Final  . Glucose, Bld 12/11/2015 98  65 - 99 mg/dL Final  . BUN 12/11/2015 14  6 - 20 mg/dL Final  . Creatinine, Ser 12/11/2015 0.83  0.44 - 1.00 mg/dL Final  . Calcium 12/11/2015 9.1  8.9 - 10.3 mg/dL Final  . Total Protein 12/11/2015 7.7  6.5 - 8.1 g/dL Final  . Albumin 12/11/2015 3.6  3.5 - 5.0 g/dL Final  . AST 12/11/2015 29  15 - 41 U/L Final  . ALT 12/11/2015 17  14 - 54 U/L Final  . Alkaline Phosphatase 12/11/2015 78  38 - 126 U/L Final  . Total Bilirubin 12/11/2015 0.3  0.3 - 1.2 mg/dL Final  . GFR calc non Af Amer 12/11/2015 >60  >60 mL/min Final  . GFR calc Af Amer 12/11/2015 >60  >60 mL/min Final   Comment: (NOTE) The eGFR has been calculated using the CKD EPI equation. This calculation  has not been validated in all clinical situations. eGFR's persistently <60 mL/min signify possible Chronic Kidney Disease.   . Anion gap 12/11/2015 12  5 - 15 Final  Admission on 12/07/2015, Discharged on 12/10/2015  Component Date Value Ref Range Status  . WBC 12/07/2015 8.6  3.6 - 11.0 K/uL Final  . RBC 12/07/2015 4.32  3.80 - 5.20 MIL/uL Final  . Hemoglobin 12/07/2015 13.1  12.0 - 16.0 g/dL Final  . HCT 12/07/2015 39.4  35.0 - 47.0 % Final  . MCV 12/07/2015 91.2  80.0 - 100.0 fL Final  . MCH 12/07/2015 30.3  26.0 - 34.0 pg Final  . MCHC 12/07/2015 33.2  32.0 - 36.0 g/dL Final  . RDW 12/07/2015 18.4* 11.5 - 14.5 % Final  . Platelets 12/07/2015 292  150 - 440 K/uL Final  . Neutrophils Relative % 12/07/2015 70  % Final  . Neutro Abs 12/07/2015 6.1  1.4 - 6.5 K/uL Final  . Lymphocytes Relative 12/07/2015 17  % Final  . Lymphs Abs 12/07/2015 1.5  1.0 - 3.6 K/uL Final  . Monocytes Relative 12/07/2015 11  % Final  . Monocytes Absolute 12/07/2015 1.0* 0.2 - 0.9 K/uL Final  . Eosinophils Relative 12/07/2015 1  % Final  . Eosinophils Absolute 12/07/2015 0.0  0 - 0.7 K/uL Final  . Basophils Relative 12/07/2015 1  % Final  . Basophils Absolute 12/07/2015 0.1  0 - 0.1 K/uL Final  . Sodium 12/07/2015 135  135 - 145 mmol/L Final  . Potassium 12/07/2015 4.2  3.5 - 5.1 mmol/L Final  . Chloride 12/07/2015 100* 101 - 111 mmol/L Final  . CO2 12/07/2015 26  22 - 32 mmol/L Final  . Glucose, Bld 12/07/2015 103* 65 - 99 mg/dL Final  . BUN 12/07/2015 11  6 - 20 mg/dL Final  . Creatinine, Ser 12/07/2015 0.76  0.44 - 1.00 mg/dL Final  . Calcium 12/07/2015 9.7  8.9 - 10.3 mg/dL Final  . GFR calc non Af Amer 12/07/2015 >60  >60 mL/min Final  . GFR calc Af Amer 12/07/2015 >60  >60 mL/min Final   Comment: (NOTE) The eGFR has  been calculated using the CKD EPI equation. This calculation has not been validated in all clinical situations. eGFR's persistently <60 mL/min signify possible Chronic  Kidney Disease.   . Anion gap 12/07/2015 9  5 - 15 Final  . Troponin I 12/07/2015 <0.03  <0.03 ng/mL Final  . Sodium 12/08/2015 138  135 - 145 mmol/L Final  . Potassium 12/08/2015 3.9  3.5 - 5.1 mmol/L Final  . Chloride 12/08/2015 104  101 - 111 mmol/L Final  . CO2 12/08/2015 26  22 - 32 mmol/L Final  . Glucose, Bld 12/08/2015 102* 65 - 99 mg/dL Final  . BUN 12/08/2015 7  6 - 20 mg/dL Final  . Creatinine, Ser 12/08/2015 0.68  0.44 - 1.00 mg/dL Final  . Calcium 12/08/2015 9.3  8.9 - 10.3 mg/dL Final  . GFR calc non Af Amer 12/08/2015 >60  >60 mL/min Final  . GFR calc Af Amer 12/08/2015 >60  >60 mL/min Final   Comment: (NOTE) The eGFR has been calculated using the CKD EPI equation. This calculation has not been validated in all clinical situations. eGFR's persistently <60 mL/min signify possible Chronic Kidney Disease.   . Anion gap 12/08/2015 8  5 - 15 Final  . WBC 12/08/2015 6.9  3.6 - 11.0 K/uL Final  . RBC 12/08/2015 3.88  3.80 - 5.20 MIL/uL Final  . Hemoglobin 12/08/2015 12.1  12.0 - 16.0 g/dL Final  . HCT 12/08/2015 34.8* 35.0 - 47.0 % Final  . MCV 12/08/2015 89.6  80.0 - 100.0 fL Final  . MCH 12/08/2015 31.0  26.0 - 34.0 pg Final  . MCHC 12/08/2015 34.6  32.0 - 36.0 g/dL Final  . RDW 12/08/2015 18.2* 11.5 - 14.5 % Final  . Platelets 12/08/2015 275  150 - 440 K/uL Final  . Prothrombin Time 12/08/2015 13.7  11.4 - 15.2 seconds Final  . INR 12/08/2015 1.05   Final  . aPTT 12/08/2015 29  24 - 36 seconds Final  . MRSA by PCR 12/08/2015 NEGATIVE  NEGATIVE Final   Comment:        The GeneXpert MRSA Assay (FDA approved for NASAL specimens only), is one component of a comprehensive MRSA colonization surveillance program. It is not intended to diagnose MRSA infection nor to guide or monitor treatment for MRSA infections.   . Weight 12/08/2015 2352  oz Final  . Height 12/08/2015 67  in Final  . BP 12/08/2015 126/75  mmHg Final  . CYTOLOGY - NON GYN 12/10/2015    Final                    Value:Cytology - Non PAP CASE: [id number] PATIENT: Mollyann Miracle Non-Gyn Cytology Report     SPECIMEN SUBMITTED: A. Pleural fluid, right  CLINICAL HISTORY: None Provided  PRE-OPERATIVE DIAGNOSIS: None provided  POST-OPERATIVE DIAGNOSIS: None provided.     DIAGNOSIS: A. RIGHT PLEURAL FLUID; ULTRASOUND-GUIDED THORACENTESIS: - METASTATIC CARCINOMA, CONSISTENT WITH MAMMARY LOBULAR CARCINOMA, SEE NOTE.  Note: Immunohistochemical stains were performed. Calretinin highlights background benign mesothelial cells while GATA 3 and ER are immunoreactive in numerous tumor cells. The pattern of immunoreactivity supports the diagnosis. Cell block material, cytospin and ThinPrep slides were reviewed. The control slides stained appropriately.    GROSS DESCRIPTION:  A. Specimen Labeled: right pleural fluid Volume: 600 mL Description: translucent yellow Cell block(s): 1 and 1 ThinPrep Final Diagnosis performed by Delorse Lek, MD.  Electronically signed 12/10/2015 2:4  3:23PM    The electronic signature indicates that the named Attending Pathologist has evaluated the specimen  Technical component performed at [facility], [address] [place], [place] [address] Lab: [phone] Dir: Darrick Penna. Evette Doffing, MD  Professional component performed at [facility], [facility], [place], [place], [place] [address] Lab: [phone] Dir: Dellia Nims. Rubinas, MD    . LD, Fluid 12/08/2015 186* 3 - 23 U/L Final   Comment: (NOTE) Results should be evaluated in conjunction with serum values   . Fluid Type-FLDH 12/08/2015 CYTOPLEU   Final  . Total protein, fluid 12/08/2015 4.0  g/dL Final   Comment: (NOTE) No normal range established for this test Results should be evaluated in conjunction with serum values   . Fluid Type-FTP 12/08/2015 PLEURAL   Corrected  . Fluid Type-FCT 12/08/2015 PLEURAL   Corrected  . Color, Fluid 12/08/2015  YELLOW* YELLOW Final  . Appearance, Fluid 12/08/2015 HAZY* CLEAR Final  . WBC, Fluid 12/08/2015 2157  cu mm Final  . Neutrophil Count, Fluid 12/08/2015 8  % Final  . Lymphs, Fluid 12/08/2015 50  % Final  . Monocyte-Macrophage-Serous Fluid 12/08/2015 42  % Final  . Eos, Fluid 12/08/2015 0  % Final  . Other Cells, Fluid 12/08/2015 0  % Final  . Specimen Description 12/11/2015 PLEURAL   Final  . Special Requests 12/11/2015 NONE   Final  . Gram Stain 12/11/2015    Final                   Value:MODERATE WBC PRESENT,BOTH PMN AND MONONUCLEAR NO ORGANISMS SEEN   . Culture 12/11/2015    Final                   Value:No growth aerobically or anaerobically. Performed at [facility]   . Report Status 12/11/2015 12/11/2015 FINAL   Final    Assessment:  LEIANNA BARGA is a 65 y.o. African American woman with a history of stage I right breast cancer s/p modified radical mastectomy on 06/12/1996. Pathology revealed a grade III mutifocal infiltrating ductal carcinoma (tumor size 8.5 cm with an invasive component 1.6 cm). Twenty-one lymph nodes were negative. Tumor was ER/PR negative. She received 4 cycles of AC at [facility].  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 PET scan on 05/22/2012 revealed multiple areas of disease. She received 6 cycles of Taxotere and Cytoxan (TC) from 06/06/2012 - 09/29/2012. PET scan on 09/07/2012 noted marked improvement. Post chemotherapy biopsy was positive. She was not a surgical candidate. She was placed on Femara.  PET scan on 05/03/2013 revealed a new focus along lateral left breast and a new level II cervical lymph node. She received radiation from 09/13/2013 - 11/05/2013. PET scan on 01/01/2014 revealed new nodule medial left chest wall pleural/paraspinal activity LUL. Multiple biopsies on 01/19/2014 were positive. She began daily letrozole  and Ibrance on 02/12/2014. Despite treatment, new nodules formed. She received a total of 2 cycles (delays in therapy).   PET scan on 05/09/2014 revealed marked progression of disease with multifocal masses in chest wall (left > right). She did not receive Imbrance in 04/2014. CA27.29 has increased: 22.9 on 01/08/2014, 54 on 03/05/2014, 170.8 on 05/06/2014, 250.8 on 05/23/2014, 445.8 on 06/14/2014, and 531.1 on 07/02/2014.   She received 3 cycles of Xeloda (02/12 - 07/05/2014). Chest and abdomen CT scan on 07/05/2014 revealed persistent and slightly progressive diffuse locally  invasive chest wall tumor. Bone scan on 07/25/2014 revealed no evidence of metastatic disease.   She received 8 cycles of [place] (08/06/2014 - 01/07/2015) on ACCRU [id number] I. 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  Chest, abdomen, and pelvic CT scan on 10/24/2014 revealed improved multifocal inflammatory left breast cancer.  The dominant 2.6 x 3.5 cm lesion in the left upper outer left breast had decreased to 1.7 x 3.5 cm. The anterior sternal lesion has decreased from 2 x 4 cm to 1.5 x 3.9 cm. There were no suspicious mediastinal, hilar or axillary adenopathy.    Chest, abdomen, and pelvic CT scan on 01/20/2015 revealed inflammatory left breast cancer with new necrotic appearing subcutaneous nodules in the lateral left breast and lateral left chest wall.   CA27.29 was 339.8 on 08/06/2014, 51.7 on 10/15/2014, 37.6 on 12/10/2014, 26.4 on 01/07/2015, 34.1 on 01/28/2015, and 49.3 on 03/10/2015,  258.6 on 06/12/2015, 421.3 on 07/10/2015, 726.3 on 07/25/2015, 911 on 08/14/2015, 1031 on 09/17/2015, and 1242 on 10/17/2015.  She received 2 cycles of Taxol given 3 weeks on/1 week off (02/10/2015 - 03/10/2015).  Chest and abdomen CT scan on 04/03/2015 revealed interval growth of superficial tumors in the lateral left breast and lateral left chest wall.  In the left lateral chest, disease had increased from 2.3 x 2 to  2.9 x 2.2 cm.  In the superficial lateral left chest wall, disease had increased from 1.7 x 1.2 cm to 2.7 x 1.8 cm.  There were stable findings of locally infiltrative tumor in the left chest wall and left axilla, including extensive left chest wall skin thickening, infiltrative soft tissue encasing the left axillary artery and focal pleural thickening in the anterior left mid pleural space.  There was no metastatic disease in the abdomen.  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  Estrogen receptor was > 90%, progesterone receptor was > 90%, and Her2/neu was 1+.    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 Treatment options of Faslodex (ESR1) and everolimus (approved) and temsirolimus (approved therapy for other tumor types).  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  Mutational burden was TMB-intermediate; 12 Muts/Mb.  She received Faslodex from 05/08/2015 - 09/19/2015. She received 3 cycles of Ibrance (07/26/2015 - 09/19/2015).  Chest, abdomen, and pelvic CT scans on 07/09/2015 revealed interval progression of disease in the left anterior and lateral chest wall.  There was interval development of nodular pleural thickening in the posterior medial left hemi thorax with some focal fluid in the left major fissure. Findings raised concern for interval development of pleural involvement by tumor.  There was no evidence for metastatic disease in the abdomen   Chest, abdomen, and pelvic CT scan on 10/20/2015 revealed progression of subcarinal lymph node (now 1.1 cm).  There was the interval development of trace pericardial effusion with some pericardial nodularity along the left heart border.  There was progression of fissural  fluid in the left hemi thorax with increase in left pleural nodularity.  There was progression of left antral lateral chest wall disease with invasion of the left pectoralis major muscle.  Subcutaneous metastatic nodules had progressed.  She received 1 cycle of Aromasin and everolimus (began 10/21/2015 and 10/29/2015).  Disease progressed.  She is s/p 1 cycle of Keytruda (11/20/2015).  Chest CT angiogram on 12/07/2015 revealed no evidence of pulmonary embolism, but a moderate pericardial effusion, increased pleural fluid (small right and trace left), increasing infiltrative neoplasm on the left chest wall, and increase in pleural metastasis along the left heart border.  She underwent right sided thoracentesis on 12/08/2015.  Cytology revealed metastatic mammary lobular carcinoma.  Echo revealed an EF of 45-50% with a small anterior pericardial effusion.  Symptomatically, has progressive disease.  Pain is well controlled.  She becomes short of breath with exertion.  Exam reveals deep left sided necrotic chest wall disease.  She is losing weight.  Plan: 1.  Labs today:  CBC with diff, CMP, CA27.29. 2.  Discuss results of thoracentesis.  Fluid is + breast cancer.  Discuss no benefit from Lasix to prevent recurrence.  Discuss PleurX catheter when fluid reaccumulates.  Patient interested in talking with Dr. Genevive Bi. 3.  Discuss patient's thoughts about therapy.  Patient feels weak.  She would like to postpone cycle #2 Keytruda.  It is unclear if she will respond to treatment.  Discuss reconsideration of palliative radiation.  Discuss Hospice. 4.  Discuss poor appetite.  We discussed Megace.  Side effects were reviewed including hypertension, hyperglycemia, and risk for thrombosis. 5.  Rx:  Megace 200 mg a day. 6.  Consult Dr. Genevive Bi: PleurX catheter. 7.  Radiation oncology follow-up - palliative XRT. 8.  RTC in 1 week for MD assessment.   Lequita Asal, MD  12/11/2015, 3:48 PM

## 2015-12-12 LAB — CANCER ANTIGEN 27.29: CA 27.29: 1724.5 U/mL — ABNORMAL HIGH (ref 0.0–38.6)

## 2015-12-12 LAB — T4: T4, Total: 7.8 ug/dL (ref 4.5–12.0)

## 2015-12-13 ENCOUNTER — Encounter: Payer: Self-pay | Admitting: Hematology and Oncology

## 2015-12-16 ENCOUNTER — Encounter: Payer: Self-pay | Admitting: Emergency Medicine

## 2015-12-16 ENCOUNTER — Emergency Department: Payer: Medicare Other

## 2015-12-16 ENCOUNTER — Ambulatory Visit: Payer: Medicare Other | Admitting: Internal Medicine

## 2015-12-16 ENCOUNTER — Ambulatory Visit
Admission: RE | Admit: 2015-12-16 | Discharge: 2015-12-16 | Disposition: A | Discharge status: Home / Self Care | Payer: Medicare Other | Source: Ambulatory Visit | Attending: Cardiothoracic Surgery | Admitting: Cardiothoracic Surgery

## 2015-12-16 ENCOUNTER — Ambulatory Visit (INDEPENDENT_AMBULATORY_CARE_PROVIDER_SITE_OTHER): Payer: Medicare Other | Admitting: Cardiothoracic Surgery

## 2015-12-16 ENCOUNTER — Inpatient Hospital Stay
Admission: EM | Admit: 2015-12-16 | Discharge: 2015-12-19 | DRG: 180 | Disposition: A | Discharge status: Hospice | Payer: Medicare Other | Attending: Internal Medicine | Admitting: Internal Medicine

## 2015-12-16 ENCOUNTER — Telehealth: Payer: Self-pay | Admitting: *Deleted

## 2015-12-16 ENCOUNTER — Encounter: Payer: Self-pay | Admitting: Cardiothoracic Surgery

## 2015-12-16 VITALS — BP 109/73 | HR 121 | Temp 97.8°F | Ht 67.0 in | Wt 151.0 lb

## 2015-12-16 DIAGNOSIS — C50912 Malignant neoplasm of unspecified site of left female breast: Secondary | ICD-10-CM | POA: Diagnosis present

## 2015-12-16 DIAGNOSIS — F419 Anxiety disorder, unspecified: Secondary | ICD-10-CM | POA: Diagnosis present

## 2015-12-16 DIAGNOSIS — R918 Other nonspecific abnormal finding of lung field: Secondary | ICD-10-CM

## 2015-12-16 DIAGNOSIS — J449 Chronic obstructive pulmonary disease, unspecified: Secondary | ICD-10-CM | POA: Diagnosis present

## 2015-12-16 DIAGNOSIS — G893 Neoplasm related pain (acute) (chronic): Secondary | ICD-10-CM

## 2015-12-16 DIAGNOSIS — I82A12 Acute embolism and thrombosis of left axillary vein: Secondary | ICD-10-CM | POA: Diagnosis present

## 2015-12-16 DIAGNOSIS — Z9221 Personal history of antineoplastic chemotherapy: Secondary | ICD-10-CM

## 2015-12-16 DIAGNOSIS — I313 Pericardial effusion (noninflammatory): Secondary | ICD-10-CM | POA: Diagnosis present

## 2015-12-16 DIAGNOSIS — Z800 Family history of malignant neoplasm of digestive organs: Secondary | ICD-10-CM

## 2015-12-16 DIAGNOSIS — R0602 Shortness of breath: Secondary | ICD-10-CM | POA: Diagnosis not present

## 2015-12-16 DIAGNOSIS — Z515 Encounter for palliative care: Secondary | ICD-10-CM | POA: Diagnosis not present

## 2015-12-16 DIAGNOSIS — I2489 Other forms of acute ischemic heart disease: Secondary | ICD-10-CM

## 2015-12-16 DIAGNOSIS — J90 Pleural effusion, not elsewhere classified: Secondary | ICD-10-CM | POA: Diagnosis present

## 2015-12-16 DIAGNOSIS — R000 Tachycardia, unspecified: Secondary | ICD-10-CM | POA: Diagnosis present

## 2015-12-16 DIAGNOSIS — I739 Peripheral vascular disease, unspecified: Secondary | ICD-10-CM | POA: Diagnosis present

## 2015-12-16 DIAGNOSIS — I3139 Other pericardial effusion (noninflammatory): Secondary | ICD-10-CM | POA: Diagnosis present

## 2015-12-16 DIAGNOSIS — I248 Other forms of acute ischemic heart disease: Secondary | ICD-10-CM | POA: Diagnosis present

## 2015-12-16 DIAGNOSIS — Z833 Family history of diabetes mellitus: Secondary | ICD-10-CM

## 2015-12-16 DIAGNOSIS — Z803 Family history of malignant neoplasm of breast: Secondary | ICD-10-CM

## 2015-12-16 DIAGNOSIS — J910 Malignant pleural effusion: Principal | ICD-10-CM | POA: Diagnosis present

## 2015-12-16 DIAGNOSIS — E871 Hypo-osmolality and hyponatremia: Secondary | ICD-10-CM | POA: Diagnosis present

## 2015-12-16 DIAGNOSIS — Z66 Do not resuscitate: Secondary | ICD-10-CM | POA: Diagnosis present

## 2015-12-16 DIAGNOSIS — Z170 Estrogen receptor positive status [ER+]: Secondary | ICD-10-CM

## 2015-12-16 DIAGNOSIS — F17210 Nicotine dependence, cigarettes, uncomplicated: Secondary | ICD-10-CM | POA: Diagnosis present

## 2015-12-16 DIAGNOSIS — Z09 Encounter for follow-up examination after completed treatment for conditions other than malignant neoplasm: Secondary | ICD-10-CM

## 2015-12-16 DIAGNOSIS — C50919 Malignant neoplasm of unspecified site of unspecified female breast: Secondary | ICD-10-CM | POA: Diagnosis present

## 2015-12-16 DIAGNOSIS — Z923 Personal history of irradiation: Secondary | ICD-10-CM

## 2015-12-16 DIAGNOSIS — Z801 Family history of malignant neoplasm of trachea, bronchus and lung: Secondary | ICD-10-CM

## 2015-12-16 DIAGNOSIS — R609 Edema, unspecified: Secondary | ICD-10-CM

## 2015-12-16 DIAGNOSIS — Z79818 Long term (current) use of other agents affecting estrogen receptors and estrogen levels: Secondary | ICD-10-CM

## 2015-12-16 DIAGNOSIS — E43 Unspecified severe protein-calorie malnutrition: Secondary | ICD-10-CM | POA: Diagnosis present

## 2015-12-16 DIAGNOSIS — D649 Anemia, unspecified: Secondary | ICD-10-CM | POA: Diagnosis present

## 2015-12-16 DIAGNOSIS — Z8249 Family history of ischemic heart disease and other diseases of the circulatory system: Secondary | ICD-10-CM

## 2015-12-16 LAB — CBC
HCT: 34.5 % — ABNORMAL LOW (ref 35.0–47.0)
HEMOGLOBIN: 11.9 g/dL — AB (ref 12.0–16.0)
MCH: 30.5 pg (ref 26.0–34.0)
MCHC: 34.4 g/dL (ref 32.0–36.0)
MCV: 88.7 fL (ref 80.0–100.0)
Platelets: 311 10*3/uL (ref 150–440)
RBC: 3.89 MIL/uL (ref 3.80–5.20)
RDW: 17.8 % — AB (ref 11.5–14.5)
WBC: 8.6 10*3/uL (ref 3.6–11.0)

## 2015-12-16 LAB — COMPREHENSIVE METABOLIC PANEL
ALT: 12 U/L — ABNORMAL LOW (ref 14–54)
ANION GAP: 4 — AB (ref 5–15)
AST: 27 U/L (ref 15–41)
Albumin: 3.3 g/dL — ABNORMAL LOW (ref 3.5–5.0)
Alkaline Phosphatase: 73 U/L (ref 38–126)
BILIRUBIN TOTAL: 0.2 mg/dL — AB (ref 0.3–1.2)
BUN: 14 mg/dL (ref 6–20)
CO2: 26 mmol/L (ref 22–32)
Calcium: 9.1 mg/dL (ref 8.9–10.3)
Chloride: 102 mmol/L (ref 101–111)
Creatinine, Ser: 0.64 mg/dL (ref 0.44–1.00)
Glucose, Bld: 92 mg/dL (ref 65–99)
POTASSIUM: 3.7 mmol/L (ref 3.5–5.1)
Sodium: 132 mmol/L — ABNORMAL LOW (ref 135–145)
TOTAL PROTEIN: 7 g/dL (ref 6.5–8.1)

## 2015-12-16 LAB — TROPONIN I

## 2015-12-16 MED ORDER — LEVOFLOXACIN IN D5W 750 MG/150ML IV SOLN
750.0000 mg | Freq: Once | INTRAVENOUS | Status: AC
  Administered 2015-12-16: 750 mg via INTRAVENOUS
  Filled 2015-12-16: qty 150

## 2015-12-16 MED ORDER — IOPAMIDOL (ISOVUE-370) INJECTION 76%
75.0000 mL | Freq: Once | INTRAVENOUS | Status: AC | PRN
  Administered 2015-12-16: 75 mL via INTRAVENOUS

## 2015-12-16 MED ORDER — LEVOFLOXACIN 750 MG PO TABS: 750.0000 mg | ORAL_TABLET | Freq: Every day | ORAL | 0 refills | Status: AC

## 2015-12-16 NOTE — Progress Notes (Signed)
Patient ID: [name] [name], female   DOB: 1950/10/27, 65 y.o.   MRN: [id number]  Chief Complaint  Patient presents with  . Other    consult for pluerex cath placement    Referred By [name] Reason for Referral right pleural effusion  HPI Location, Quality, Duration, Severity, Timing, Context, Modifying Factors, Associated Signs and Symptoms.  [name] [name] is a 65 y.o. female.  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 She has been treated with radiation therapy and chemotherapy and has extensive metastatic disease. She was admitted to the hospital about a week or so ago with increasing shortness of breath and a CT scan demonstrated a large pericardial effusion and bilateral pleural effusions. She underwent a right-sided thoracentesis which was positive for a breast carcinoma. She presents here today for consideration of a Pleurx catheter insertion. The patient states that she felt better after her thoracentesis but now feels like she is quite short of breath. She states she feels as short of breath now she did when she was first admitted to the hospital. In addition she's noticed some swelling of her left arm. This is been relatively new. She denied any fevers or chills.   Past Medical History:  Diagnosis Date  . Anemia   . Anxiety   . Blood transfusion without reported diagnosis   . Emphysema of lung ([place])   . History of left breast cancer , known active 2013  . Personal history of malignant neoplasm of breast     Past Surgical History:  Procedure Laterality Date  . ABDOMINAL HYSTERECTOMY  1995  . BREAST BIOPSY Left 2014  . BREAST BIOPSY Left 01-21-14  . BREAST SURGERY Right 1996   mastectomy  . mastectomy Right 1997  . NEPHRECTOMY    . oophrectomy  1980    Family History  Problem Relation Age of Onset  . Cancer Father     lung  . Heart disease  Sister   . Diabetes Sister   . Heart disease Brother   . Cancer Paternal Grandmother     breast  . Cancer Sister     colon    Social History Social History  Substance Use Topics  . Smoking status: Current Every Day Smoker    Packs/day: 0.25    Years: 25.00    Types: Cigarettes  . Smokeless tobacco: Never Used     Comment: 10/29/14-now decreased. smokes 1 pack per week  . Alcohol use No    Allergies  Allergen Reactions  . Sulfa Antibiotics Nausea And Vomiting and Other (See Comments)    headache    Current Outpatient Prescriptions  Medication Sig Dispense Refill  . Calcium Carb-Cholecalciferol (CALCIUM 600 + D PO) Take 1 tablet by mouth 2 (two) times daily.    . feeding supplement, ENSURE ENLIVE, (ENSURE ENLIVE) LIQD Take 237 mLs by mouth 2 (two) times daily between meals. 237 mL 12  . KLOR-CON 10 10 MEQ tablet TAKE 1 TABLET (10 MEQ TOTAL) BY MOUTH DAILY. 30 tablet 1  . LORazepam (ATIVAN) 0.5 MG tablet Take 0.5 mg by mouth daily as needed for anxiety.   0  . megestrol (MEGACE) 400 MG/10ML suspension Take 5 mLs (200 mg total) by mouth daily. 150 mL 1  . traMADol (ULTRAM) 50 MG tablet 1 - 2 tablets (50 - 100 mg) every 6 hours as needed for pain (Patient taking differently:  Take 50-100 mg by mouth every 6 (six) hours as needed for moderate pain. ) 90 tablet 0   No current facility-administered medications for this visit.    Facility-Administered Medications Ordered in Other Visits  Medication Dose Route Frequency Provider Last Rate Last Dose  . sodium chloride 0.9 % injection 10 mL  10 mL Intravenous PRN Lequita Asal, MD   10 mL at 10/08/14 1335  . sodium chloride 0.9 % injection 10 mL  10 mL Intracatheter PRN Lequita Asal, MD   10 mL at 11/26/14 1355      Review of Systems A complete review of systems was asked and was negative except for the following positive findingsWeight loss, skin lesions consistent with inflammatory breast carcinoma  Blood pressure  109/73, pulse (!) 121, temperature 97.8 F (36.6 C), temperature source Oral, height 5\' 7"  (1.702 m), weight 151 lb (68.5 kg), SpO2 98 %.  Physical Exam CONSTITUTIONAL:  Pleasant, well-developed, well-nourished, and in no acute distress. EYES: Pupils equal and reactive to light, Sclera non-icteric EARS, NOSE, MOUTH AND THROAT:  The oropharynx was clear.  Dentition is poorrepair.  Oral mucosa pink and moist. LYMPH NODES:  Lymph nodes in the neck and axillae were normal RESPIRATORY:  Lungs were clear but with diminished breath sounds at the left base..  Normal respiratory effort without pathologic use of accessory muscles of respiration CARDIOVASCULAR: Heart was regular without murmurs.  There were no carotid bruits. GI: The abdomen was soft, nontender, and nondistended. There were no palpable masses. There was no hepatosplenomegaly. There were normal bowel sounds in all quadrants. GU:  Rectal deferred.   MUSCULOSKELETAL:  Normal muscle strength and tone.  No clubbing or cyanosis.  There was a large dressing on the left chest wall consistent with her carcinoma SKIN:  There are multiple nodules scattered on the chest wall consistent with inflammatory breast carcinoma NEUROLOGIC:  Sensation is normal.  Cranial nerves are grossly intact. PSYCH:  Oriented to person, place and time.  Mood and affect are normal.  Data Reviewed I did obtain a chest x-ray today  I have personally reviewed the patient's imaging, laboratory findings and medical records.    Assessment    Chest x-ray today was independently reviewed. This shows a small right-sided pleural effusion. I reviewed with the patient the indications and risks of Pleurx catheter insertion. Risks of bleeding, infection, catheter malfunction and death were all reviewed. On the basis of her chest x-ray there is minimal fluid present on the right at this time that the patient remains symptomatic.    Plan    I will discuss her care with Dr.  Mike Gip. I will see how she would like [place] to proceed given the small pleural effusion present.  I asked the patient to call me tomorrow to review with me with Dr. Kem Parkinson recommendations.       Nestor Lewandowsky, MD 12/16/2015, 11:12 AM

## 2015-12-16 NOTE — ED Notes (Signed)
Ambulated patient on RA.  patient c/o DOE.  Patient's heart rate increased to 135 ST, RR increased to 28 and SPO2 96-100 %.  Patient returned to room and sat on stretcher.  RR 24/28.  SPO2 95-98 %  HR 110-116.  Patient less dyspneic when at rest.  Continue to monitor.

## 2015-12-16 NOTE — ED Triage Notes (Signed)
Pt to ED from home c/o SOB today.  Pt states seen here last week for same and had thoracentesis done last week but SOB has been gradually getting worse.  Pt had x-ray done this morning that showed minimal fluid around lungs, advised to come in by oncologist.  Pt has breast cancer.  States dyspnea with exertion.  Pt A&Ox4, speaking in complete and coherent sentences.

## 2015-12-16 NOTE — ED Notes (Signed)
Pt c/o burning to IV site with Levaquin administration, attempted to draw back blood unsuccessful. When flushing site pt reports pain, no swelling noted when flushed. IV site changed and removed IV in right forearm, informed pt of IV site change, pt verbalized understanding.

## 2015-12-16 NOTE — ED Provider Notes (Signed)
[facility] Emergency Department Provider Note  ____________________________________________   I have reviewed the triage vital signs and the nursing notes.   HISTORY  Chief Complaint Shortness of Breath   History limited by: Not Limited   HPI [name] [name] is a 65 y.o. female who presents to the emergency department today because of concerns for increasing shortness of breath. Patient had a recent hospitalization for shortness of breath and pleural effusion. He had a pleuracentesis performed and stated that at the time of her discharge she was feeling better. Since being discharged see his gradually been feeling worse. She has not had any significant cough or chest pain. Patient has noticed some worsening of her chronic left upper arm swelling. It is now extending down to the forearm. Patient denies any recent fevers. Had an outpatient chest x-ray today which did not show any significant pleural effusion.   Past Medical History:  Diagnosis Date  . Anemia   . Anxiety   . Blood transfusion without reported diagnosis   . Emphysema of lung ([place])   . History of left breast cancer , known active 2013  . Personal history of malignant neoplasm of breast     Patient Active Problem List   Diagnosis Date Noted  . Systolic dysfunction [id number]  . Protein-calorie malnutrition, severe 12/08/2015  . Anxiety 12/07/2015  . Pleural effusion 12/07/2015  . Pericardial effusion 12/07/2015  . 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 Neuropathy ([place]) 11/19/2014  . Weight loss 11/10/2014  . Inflammatory carcinoma of left breast ([place]) 08/06/2014  . Metastatic breast cancer ([place]) 01/22/2014    Past Surgical History:  Procedure Laterality Date  . ABDOMINAL HYSTERECTOMY  1995  . BREAST BIOPSY Left 2014  . BREAST BIOPSY Left 01-21-14  . BREAST SURGERY Right 1996   mastectomy  .  mastectomy Right 1997  . NEPHRECTOMY    . oophrectomy  1980    Prior to Admission medications   Medication Sig Start Date End Date Taking? Authorizing Provider  Calcium Carb-Cholecalciferol (CALCIUM 600 + D PO) Take 1 tablet by mouth 2 (two) times daily.    Historical Provider, MD  feeding supplement, ENSURE ENLIVE, (ENSURE ENLIVE) LIQD Take 237 mLs by mouth 2 (two) times daily between meals. 12/10/15   Lytle [place], MD  KLOR-CON 10 10 MEQ tablet TAKE 1 TABLET (10 MEQ TOTAL) BY MOUTH DAILY. 05/25/15   Lequita Asal, MD  LORazepam (ATIVAN) 0.5 MG tablet Take 0.5 mg by mouth daily as needed for anxiety.  08/30/14   Historical Provider, MD  megestrol (MEGACE) 400 MG/10ML suspension Take 5 mLs (200 mg total) by mouth daily. 12/11/15   Lequita Asal, MD  traMADol (ULTRAM) 50 MG tablet 1 - 2 tablets (50 - 100 mg) every 6 hours as needed for pain Patient taking differently: Take 50-100 mg by mouth every 6 (six) hours as needed for moderate pain.  11/21/15   Lequita Asal, MD    Allergies Sulfa antibiotics  Family History  Problem Relation Age of Onset  . Cancer Father     lung  . Heart disease Sister   . Diabetes Sister   . Heart disease Brother   . Cancer Paternal Grandmother     breast  . Cancer Sister     colon    Social History Social History  Substance Use Topics  . Smoking status: Current Every Day Smoker  Packs/day: 0.25    Years: 25.00    Types: Cigarettes  . Smokeless tobacco: Never Used     Comment: 10/29/14-now decreased. smokes 1 pack per week  . Alcohol use No    Review of Systems  Constitutional: Negative for fever. Cardiovascular: Negative for chest pain. Respiratory: Positive for shortness of breath. Gastrointestinal: Negative for abdominal pain, vomiting and diarrhea. Genitourinary: Negative for dysuria. Musculoskeletal: Negative for back pain. Skin: Negative for rash. Neurological: Negative for headaches, focal weakness or  numbness. 10-point ROS otherwise negative.  ____________________________________________   PHYSICAL EXAM:  VITAL SIGNS: ED Triage Vitals  Enc Vitals Group     BP 12/16/15 1622 113/78     Pulse Rate 12/16/15 1622 (!) 116     Resp 12/16/15 1622 20     Temp 12/16/15 1651 99 F (37.2 C)     Temp Source 12/16/15 1651 Oral     SpO2 12/16/15 1622 97 %     Weight 12/16/15 1652 150 lb (68 kg)     Height 12/16/15 1652 5\' 7"  (1.702 m)     Head Circumference --      Peak Flow --      Pain Score 12/16/15 1652 0   Constitutional: Alert and oriented. Well appearing and in no distress. Eyes: Conjunctivae are normal. Normal extraocular movements. ENT   Head: Normocephalic and atraumatic.   Nose: No congestion/rhinnorhea.   Mouth/Throat: Mucous membranes are moist.   Neck: No stridor. Hematological/Lymphatic/Immunilogical: No cervical lymphadenopathy. Cardiovascular: Normal rate, regular rhythm.  No murmurs, rubs, or gallops. Respiratory: Normal respiratory effort without tachypnea nor retractions. Breath sounds are clear and equal bilaterally. No wheezes/rales/rhonchi. Gastrointestinal: Soft and nontender. No distention.  Genitourinary: Deferred Musculoskeletal: Normal range of motion in all extremities. No lower extremity edema. Neurologic:  Normal speech and language. No gross focal neurologic deficits are appreciated.  Skin:  Skin is warm, dry and intact. No rash noted. Psychiatric: Mood and affect are normal. Speech and behavior are normal. Patient exhibits appropriate insight and judgment.  ____________________________________________    LABS (pertinent positives/negatives)  Labs Reviewed  CBC - Abnormal; Notable for the following:       Result Value   Hemoglobin 11.9 (*)    HCT 34.5 (*)    RDW 17.8 (*)    All other components within normal limits  COMPREHENSIVE METABOLIC PANEL - Abnormal; Notable for the following:    Sodium 132 (*)    Albumin 3.3 (*)    ALT  12 (*)    Total Bilirubin 0.2 (*)    Anion gap 4 (*)    All other components within normal limits  TROPONIN I     ____________________________________________   EKG  I, Nance Pear, attending physician, personally viewed and interpreted this EKG  EKG Time: 1702 Rate: 110 Rhythm: sinus tachycardia Axis: normal Intervals: qtc 422 QRS: narrow, q waves V1,V2 ST changes: no st elevation Impression: abnormal ekg  ____________________________________________    RADIOLOGY  CXR IMPRESSION:  No significant change in the in small bilateral pleural effusions  and fluid in the left major fissure. Overall stable appearance of  the lungs.       CT angio IMPRESSION:  No definite evidence of pulmonary embolus.    Moderate right pleural effusion is noted with minimal right basilar  subsegmental atelectasis.    Stable loculated effusions seen in left major fissure.    Increased opacity is noted in lingular segment of left upper lobe  concerning for atelectasis or possibly  inflammation.    Aortic atherosclerosis.    Multiple nodules are noted in the overlying soft tissues the left  chest concerning for metastatic disease.   ____________________________________________   PROCEDURES  Procedures  ____________________________________________   INITIAL IMPRESSION / ASSESSMENT AND PLAN / ED COURSE  Pertinent labs & imaging results that were available during my care of the patient were reviewed by me and considered in my medical decision making (see chart for details).  Patient presented to the emergency department today because of concerns for worsening shortness breath. The patient was recently admitted to the hospital for pleural effusion which was drained. Will plan on checking a CT scan given that chest x-ray earlier today and did not show any concerning change in the effusion.  Clinical Course   Patient CT scan does not show any pulmonary embolism. The  patient was ambulated and was found to be quite tachycardic. Given this finding and possible inflammation will plan on admitting for treatment for pneumonia. ____________________________________________   FINAL CLINICAL IMPRESSION(S) / ED DIAGNOSES  Final diagnoses:  Shortness of breath  Tachycardia     Note: This dictation was prepared with Dragon dictation. Any transcriptional errors that result from this process are unintentional    Nance Pear, MD 12/16/15 2352

## 2015-12-16 NOTE — Telephone Encounter (Signed)
[name] [name] spoke to [name] about pt this am. She was suppose to get pleurex catheter placed and when he saw her she was very sob but cxr did not change very much but sx worse.  After speaking to [name] she wanted me to speak to pt.  Pt states she is trying to stay still every time she gets up and takes a few steps she is so SOB. I told pt if she felt that bad that dr [name] [name] feels she should go to ER but if she is ok to wait til tom. We can see her in [name].  She states she is so SOB she had already thought about going to ER.  I advised her to go and she has a ride to get her there.

## 2015-12-17 ENCOUNTER — Ambulatory Visit: Payer: Medicare Other | Admitting: Internal Medicine

## 2015-12-17 ENCOUNTER — Observation Stay: Payer: Medicare Other

## 2015-12-17 DIAGNOSIS — C50919 Malignant neoplasm of unspecified site of unspecified female breast: Secondary | ICD-10-CM | POA: Diagnosis not present

## 2015-12-17 DIAGNOSIS — I808 Phlebitis and thrombophlebitis of other sites: Secondary | ICD-10-CM | POA: Diagnosis not present

## 2015-12-17 DIAGNOSIS — I319 Disease of pericardium, unspecified: Secondary | ICD-10-CM

## 2015-12-17 DIAGNOSIS — C799 Secondary malignant neoplasm of unspecified site: Secondary | ICD-10-CM

## 2015-12-17 DIAGNOSIS — I82A12 Acute embolism and thrombosis of left axillary vein: Secondary | ICD-10-CM | POA: Diagnosis present

## 2015-12-17 DIAGNOSIS — I248 Other forms of acute ischemic heart disease: Secondary | ICD-10-CM | POA: Diagnosis not present

## 2015-12-17 LAB — URINALYSIS COMPLETE WITH MICROSCOPIC (ARMC ONLY)
Bilirubin Urine: NEGATIVE
Glucose, UA: NEGATIVE mg/dL
Hgb urine dipstick: NEGATIVE
Ketones, ur: NEGATIVE mg/dL
Leukocytes, UA: NEGATIVE
NITRITE: NEGATIVE
PH: 6 (ref 5.0–8.0)
PROTEIN: NEGATIVE mg/dL
SPECIFIC GRAVITY, URINE: 1.033 — AB (ref 1.005–1.030)

## 2015-12-17 LAB — TROPONIN I
Troponin I: <0.03 ng/mL (ref <0.03)
Troponin I: <0.03 ng/mL (ref <0.03)
Troponin I: 0.09 ng/mL (ref <0.03)
Troponin I: <0.03 ng/mL (ref <0.03)
Troponin I: <0.03 ng/mL (ref <0.03)

## 2015-12-17 LAB — BASIC METABOLIC PANEL
Anion gap: 8 (ref 5–15)
BUN: 10 mg/dL (ref 6–20)
CHLORIDE: 102 mmol/L (ref 101–111)
CO2: 24 mmol/L (ref 22–32)
CREATININE: 0.71 mg/dL (ref 0.44–1.00)
Calcium: 8.6 mg/dL — ABNORMAL LOW (ref 8.9–10.3)
GFR calc Af Amer: >60 mL/min (ref >60)
GFR calc non Af Amer: >60 mL/min (ref >60)
Glucose, Bld: 93 mg/dL (ref 65–99)
Potassium: 3.5 mmol/L (ref 3.5–5.1)
Sodium: 134 mmol/L — ABNORMAL LOW (ref 135–145)

## 2015-12-17 LAB — MAGNESIUM: MAGNESIUM: 2 mg/dL (ref 1.7–2.4)

## 2015-12-17 LAB — CBC
HCT: 32 % — ABNORMAL LOW (ref 35.0–47.0)
Hemoglobin: 10.9 g/dL — ABNORMAL LOW (ref 12.0–16.0)
MCH: 30.3 pg (ref 26.0–34.0)
MCHC: 34 g/dL (ref 32.0–36.0)
MCV: 89 fL (ref 80.0–100.0)
PLATELETS: 280 10*3/uL (ref 150–440)
RBC: 3.6 MIL/uL — AB (ref 3.80–5.20)
RDW: 18.1 % — AB (ref 11.5–14.5)
WBC: 6.8 10*3/uL (ref 3.6–11.0)

## 2015-12-17 LAB — APTT: APTT: 26 s (ref 24–36)

## 2015-12-17 LAB — PHOSPHORUS: PHOSPHORUS: 4 mg/dL (ref 2.5–4.6)

## 2015-12-17 LAB — PROTIME-INR
INR: 1.19
PROTHROMBIN TIME: 15.2 s (ref 11.4–15.2)

## 2015-12-17 MED ORDER — ZOLPIDEM TARTRATE 5 MG PO TABS: 5.0000 mg | ORAL_TABLET | Freq: Every evening | ORAL | Status: DC | PRN

## 2015-12-17 MED ORDER — ENOXAPARIN SODIUM 40 MG/0.4ML ~~LOC~~ SOLN: 40.0000 mg | SUBCUTANEOUS | Status: DC

## 2015-12-17 MED ORDER — MEGESTROL ACETATE 400 MG/10ML PO SUSP
200.0000 mg | Freq: Every day | ORAL | Status: DC
  Administered 2015-12-17 – 2015-12-18 (×2): 200 mg via ORAL
  Filled 2015-12-17 (×2): qty 5

## 2015-12-17 MED ORDER — HYDROCODONE-ACETAMINOPHEN 5-325 MG PO TABS
1.0000 | ORAL_TABLET | ORAL | Status: DC | PRN
  Administered 2015-12-17: 1 via ORAL
  Administered 2015-12-17: 2 via ORAL
  Administered 2015-12-17: 1 via ORAL
  Administered 2015-12-18: 13:00:00 2 via ORAL
  Filled 2015-12-17 (×2): qty 1
  Filled 2015-12-17 (×2): qty 2

## 2015-12-17 MED ORDER — LORAZEPAM 0.5 MG PO TABS: 0.5000 mg | ORAL_TABLET | Freq: Every day | ORAL | Status: DC | PRN

## 2015-12-17 MED ORDER — ONDANSETRON HCL 4 MG PO TABS: 4.0000 mg | ORAL_TABLET | Freq: Four times a day (QID) | ORAL | Status: DC | PRN

## 2015-12-17 MED ORDER — MAGNESIUM CITRATE PO SOLN
1.0000 | Freq: Once | ORAL | Status: DC | PRN
  Filled 2015-12-17: qty 296

## 2015-12-17 MED ORDER — TRAMADOL HCL 50 MG PO TABS
50.0000 mg | ORAL_TABLET | Freq: Four times a day (QID) | ORAL | Status: DC | PRN
  Administered 2015-12-17: 22:00:00 100 mg via ORAL
  Filled 2015-12-17: qty 2

## 2015-12-17 MED ORDER — BISACODYL 5 MG PO TBEC
5.0000 mg | DELAYED_RELEASE_TABLET | Freq: Every day | ORAL | Status: DC | PRN
  Administered 2015-12-19: 08:00:00 5 mg via ORAL
  Filled 2015-12-17: qty 1

## 2015-12-17 MED ORDER — POLYETHYLENE GLYCOL 3350 17 G PO PACK
17.0000 g | PACK | Freq: Every day | ORAL | Status: DC
  Administered 2015-12-17 – 2015-12-19 (×3): 17 g via ORAL
  Filled 2015-12-17 (×3): qty 1

## 2015-12-17 MED ORDER — ENSURE ENLIVE PO LIQD
237.0000 mL | Freq: Two times a day (BID) | ORAL | Status: DC
  Administered 2015-12-17 – 2015-12-19 (×4): 237 mL via ORAL

## 2015-12-17 MED ORDER — EXEMESTANE 25 MG PO TABS
25.0000 mg | ORAL_TABLET | Freq: Every day | ORAL | Status: DC
  Filled 2015-12-17: qty 1

## 2015-12-17 MED ORDER — SENNOSIDES-DOCUSATE SODIUM 8.6-50 MG PO TABS
1.0000 | ORAL_TABLET | Freq: Every evening | ORAL | Status: DC | PRN
  Administered 2015-12-19: 1 via ORAL
  Filled 2015-12-17: qty 1

## 2015-12-17 MED ORDER — SODIUM CHLORIDE 0.9 % IV SOLN
INTRAVENOUS | Status: DC
  Administered 2015-12-17: 02:00:00 via INTRAVENOUS

## 2015-12-17 MED ORDER — ONDANSETRON HCL 4 MG/2ML IJ SOLN
4.0000 mg | Freq: Four times a day (QID) | INTRAMUSCULAR | Status: DC | PRN
  Administered 2015-12-18: 4 mg via INTRAVENOUS

## 2015-12-17 MED ORDER — ACETAMINOPHEN 325 MG PO TABS
650.0000 mg | ORAL_TABLET | Freq: Four times a day (QID) | ORAL | Status: DC | PRN
Start: 2015-12-17 — End: 2015-12-19

## 2015-12-17 MED ORDER — POTASSIUM CHLORIDE CRYS ER 10 MEQ PO TBCR
10.0000 meq | EXTENDED_RELEASE_TABLET | Freq: Every day | ORAL | Status: DC
  Administered 2015-12-17 – 2015-12-19 (×3): 10 meq via ORAL
  Filled 2015-12-17 (×3): qty 1

## 2015-12-17 MED ORDER — IPRATROPIUM-ALBUTEROL 0.5-2.5 (3) MG/3ML IN SOLN: 3.0000 mL | Freq: Four times a day (QID) | RESPIRATORY_TRACT | Status: DC | PRN

## 2015-12-17 MED ORDER — ACETAMINOPHEN 650 MG RE SUPP: 650.0000 mg | Freq: Four times a day (QID) | RECTAL | Status: DC | PRN

## 2015-12-17 MED ORDER — EVEROLIMUS 10 MG PO TABS: 10.0000 mg | ORAL_TABLET | Freq: Every day | ORAL | Status: DC

## 2015-12-17 NOTE — Progress Notes (Signed)
Initial Nutrition Assessment  DOCUMENTATION CODES:   Not applicable  INTERVENTION:  Ensure Enlive po BID, each supplement provides 350 kcal and 20 grams of protein Encourage PO Intake  NUTRITION DIAGNOSIS:   Inadequate oral intake related to poor appetite, cancer and cancer related treatments as evidenced by per patient/family report.  GOAL:   Patient will meet greater than or equal to 90% of their needs  MONITOR:   PO intake, Labs, Weight trends, Supplement acceptance, I & O's  REASON FOR ASSESSMENT:   Malnutrition Screening Tool    ASSESSMENT:   [name] [name] is a 65 y.o. female with a known history of Malignant Pleural effusion, left sided inflammatory breast cancer presents the emergency department today complaining of shortness of breath.   Spoke with [name] briefly before MD came to speak with her. She endorses eating 2 meals per day at home, though she states she isn't always successful at doing so. She does consume ensure at home. States on chemo her appetite is severely decreased. She was placed on megace today, should see appetite increase. Was tired during my visit, with a Lunch tray at bedside she had not consumed much of. Per chart she has gained 19# over the past 7 days - likely related to Pleural Effusion, Pleurx cather to be placed tomorrow. Nutrition-Focused physical exam completed. Findings are mild fat depletion, no muscle depletion, and no edema.  Labs and medications reviewed.   Diet Order:  Diet regular Room service appropriate? Yes; Fluid consistency: Thin  Skin:  Wound (see comment) (Wound to L Chest)  Last BM:  12/14/2015  Height:   Ht Readings from Last 1 Encounters:  12/17/15 5\' 7"  (1.702 m)    Weight:   Wt Readings from Last 1 Encounters:  12/17/15 156 lb 11.2 oz (71.1 kg)    Ideal Body Weight:  61.36 kg  BMI:  Body mass index is 24.54 kg/m.  Estimated Nutritional Needs:   Kcal:  2127-2481 calories  Protein:  85-106  gm  Fluid:  >/= 2L  EDUCATION NEEDS:   No education needs identified at this time  Satira Anis. Tamotsu Wiederholt, MS, RD LDN Inpatient Clinical Dietitian Pager [phone]

## 2015-12-17 NOTE — Care Management Obs Status (Signed)
[place] NOTIFICATION   Patient Details  Name: [name] [name] MRN: [id number] Date of Birth: 1950-10-23   Medicare Observation Status Notification Given:  Yes    Shelbie Ammons, RN 12/17/2015, 12:37 PM

## 2015-12-17 NOTE — Progress Notes (Signed)
[place] [facility] [place] NAME: [name] [name]    MR#:  [id number]  DATE OF BIRTH:  1950-08-23  SUBJECTIVE:  CHIEF COMPLAINT:   Chief Complaint  Patient presents with  . Shortness of Breath  feels SOB, swollen LUE REVIEW OF SYSTEMS:  Review of Systems  Constitutional: Negative for chills, fever and weight loss.  HENT: Negative for nosebleeds and sore throat.   Eyes: Negative for blurred vision.  Respiratory: Positive for shortness of breath. Negative for cough and wheezing.   Cardiovascular: Negative for chest pain, orthopnea, leg swelling and PND.  Gastrointestinal: Negative for abdominal pain, constipation, diarrhea, heartburn, nausea and vomiting.  Genitourinary: Negative for dysuria and urgency.  Musculoskeletal: Negative for back pain.  Skin: Negative for rash.  Neurological: Negative for dizziness, speech change, focal weakness and headaches.  Endo/Heme/Allergies: Does not bruise/bleed easily.  Psychiatric/Behavioral: Negative for depression.    DRUG ALLERGIES:   Allergies  Allergen Reactions  . Sulfa Antibiotics Nausea And Vomiting and Other (See Comments)    headache   VITALS:  Blood pressure 105/65, pulse (!) 108, temperature 98.4 F (36.9 C), temperature source Oral, resp. rate 18, height 5\' 7"  (1.702 m), weight 71.1 kg (156 lb 11.2 oz), SpO2 100 %. PHYSICAL EXAMINATION:  Physical Exam  Constitutional: She is oriented to person, place, and time and well-developed, well-nourished, and in no distress.  HENT:  Head: Normocephalic and atraumatic.  Eyes: Conjunctivae and EOM are normal. Pupils are equal, round, and reactive to light.  Neck: Normal range of motion. Neck supple. No tracheal deviation present. No thyromegaly present.  Cardiovascular: Normal rate, regular rhythm and normal heart sounds.   Pulmonary/Chest: Effort normal and breath sounds normal. No respiratory distress. She has no wheezes. She exhibits no  tenderness.  Abdominal: Soft. Bowel sounds are normal. She exhibits no distension. There is no tenderness.  Musculoskeletal: Normal range of motion.       Left forearm: She exhibits swelling and edema.  Swollen LUE  Neurological: She is alert and oriented to person, place, and time. No cranial nerve deficit.  Skin: Skin is warm and dry. No rash noted.  Psychiatric: Mood and affect normal.   LABORATORY PANEL:   CBC  Recent Labs Lab 12/17/15 0350  WBC 6.8  HGB 10.9*  HCT 32.0*  PLT 280   ------------------------------------------------------------------------------------------------------------------ Chemistries   Recent Labs Lab 12/16/15 1710 12/17/15 0350  NA 132* 134*  K 3.7 3.5  CL 102 102  CO2 26 24  GLUCOSE 92 93  BUN 14 10  CREATININE 0.64 0.71  CALCIUM 9.1 8.6*  MG  --  2.0  AST 27  --   ALT 12*  --   ALKPHOS 73  --   BILITOT 0.2*  --    RADIOLOGY:  US Venous Img Upper Uni Left  Result Date: 12/17/2015 CLINICAL DATA:  Left upper extremity edema for the past 3 days. History of breast cancer. History of smoking. Evaluate for DVT. EXAM: LEFT UPPER EXTREMITY VENOUS DOPPLER ULTRASOUND TECHNIQUE: Gray-scale sonography with graded compression, as well as color Doppler and duplex ultrasound were performed to evaluate the upper extremity deep venous system from the level of the subclavian vein and including the jugular, axillary, basilic, radial, ulnar and upper cephalic vein. Spectral Doppler was utilized to evaluate flow at rest and with distal augmentation maneuvers. COMPARISON:  None. FINDINGS: Contralateral Subclavian Vein: Respiratory phasicity is normal and symmetric with the symptomatic side. No evidence of thrombus. Normal  compressibility. Internal Jugular Vein: No evidence of thrombus. Normal compressibility, respiratory phasicity and response to augmentation. Subclavian Vein: There is eccentric mixed echogenic nonocclusive thrombus within the peripheral aspect of  the left subclavian vein (representative images 7 and 8. Axillary Vein: Hypoechoic expansile occlusive DVT is noted within the left axillary vein (images 10 and 11. Cephalic Vein: No evidence of thrombus. Normal compressibility, respiratory phasicity and response to augmentation. Basilic Vein: No evidence of thrombus. Normal compressibility, respiratory phasicity and response to augmentation. Brachial Veins: There is hypoechoic occlusive thrombus within at least 1 of the 3 brachial veins extending from the proximal to the distal upper arm (representative images 22-24). Radial Veins: No evidence of thrombus. Normal compressibility, respiratory phasicity and response to augmentation. Ulnar Veins: Not well imaged. Other Findings: There is a approximately 2.1 x 0.9 x 1.8 cm fairly well-defined nodule within the left side of the neck which appears to contain a punctate echogenic calcification (representative images 34 through 39). Note is made of an additional benign appearing non pathologically enlarged left supraclavicular lymph node which is not enlarged by size criteria (measuring 0.6 cm in diameter) and maintains a benign fatty hilum (image 43). IMPRESSION: 1. Examination is positive for largely occlusive DVT extending from the peripheral aspect of the left subclavian vein, through the axillary vein and into at least 1 of the brachial veins. 2. Note made of an approximately 2.1 cm nodule within the left side of the neck, indeterminate on this examination though given history of breast cancer, may represent a pathologically enlarged lymph node. Clinical correlation is advised. These results will be called to the ordering clinician or representative by the Radiologist Assistant, and communication documented in the PACS or zVision Dashboard. Electronically Signed   By: Sandi Mariscal M.D.   On: 12/17/2015 11:16   ASSESSMENT AND PLAN:  This is a 65 y.o. 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 CT of the chest neg for PE.  - d/w Dr Genevive Bi who will plan PleurX cathetar tomorrow as she is symptomatic (SOB) and no other obvious etio for her SOB  * Lt UE DVT - will start anticoagulation after procedure tomorrow. - Onco c/s  * h/o breast CA: - Onco c/s  * Hyponatremia - improving with hydration     All the records are reviewed and case discussed with Care Management/Social Worker. Management plans discussed with the patient, family and they are in agreement.  CODE STATUS: FULL CODE  TOTAL TIME TAKING CARE OF THIS PATIENT: 35 minutes.   More than 50% of the time was spent in counseling/coordination of care: YES  POSSIBLE D/C IN 1-2 DAYS, DEPENDING ON CLINICAL CONDITION.   [facility], Malaika Arnall M.D on 12/17/2015 at 10:05 PM  Between 7am to 6pm - Pager - [phone]  After 6pm go to [email or web address] - [employer] [facility]  Office  [phone]  CC: Primary care physician; No PCP Per Patient  Note: This dictation was prepared with Dragon dictation along with smaller phrase technology. Any transcriptional errors that result from this process are unintentional.

## 2015-12-17 NOTE — Plan of Care (Signed)
Problem: Pain Managment: Goal: General experience of comfort will improve Outcome: Progressing Pt has required pain med for left breast pain.

## 2015-12-17 NOTE — Progress Notes (Signed)
I had a long discussion today with Dr. Manuella Ghazi about the options for her right pleural effusion.  Given the need for long term anticoagulation (DVT in LUE), we have elected to proceed with PleurX catheter on the right side.  I explained this in detail to the patient and family and they are in agreement.  Will place tomorrow morning.    [employer].

## 2015-12-17 NOTE — Care Management (Signed)
[employer] information for  Pleurx drainage kits started and placed on chart. Shelbie Ammons RN MSN CCM Care Management [phone]

## 2015-12-17 NOTE — Progress Notes (Signed)
Anticoagulation monitoring  Pt with new LUE DVT on [place]. Per Dr. Manuella Ghazi, holding anticoagulation for 24h per Dr Genevive Bi for Pleurx catheter insertion. Planning on starting anticoag after procedure.

## 2015-12-17 NOTE — Progress Notes (Signed)
[name] [name] Inpatient Post-Op Note  Patient ID: [name] [name], female   DOB: [birth year], 65 y.o.   MRN: [id number]  HISTORY: I saw this patient yesterday in my office and she was complaining of significant shortness of breath that appeared out of proportion to a chest xray that showed a small right sided pleural effusion and stable left sided loculated pleural effusion.  I did not think that a PleurX catheter would result in significant improvement in her dyspnea as the right sided pleural effusion was small.  She became more short of breath and presented to the ER with those symptoms and was admitted after a CT scan of the chest showed no PE, small to mod pleural and pericardial effusions.  She states she is still short of breath this morning and is about the same as yesterday.   Vitals:   12/17/15 0123 12/17/15 0423  BP: 114/67 100/62  Pulse: (!) 108 (!) 108  Resp: 17 18  Temp: 98.4 F (36.9 C) 98.7 F (37.1 C)     EXAM:    Resp: Lungs show decreased breath sounds on the left and slightly decreased breath sounds on the right.  No respiratory distress, normal effort. Heart:  Regular without murmurs Abd:  Abdomen is soft, non distended and non tender. No masses are palpable.  There is no rebound and no guarding.  Neurological: Alert and oriented to person, place, and time. Coordination normal.  Skin: Skin is warm and dry. No rash noted. No diaphoretic. No erythema. No pallor. LUE swelling Psychiatric: Normal mood and affect. Normal behavior. Judgment and thought content normal.    ASSESSMENT: There is a small right sided pleural effusion.  I am not sure that is the cause of her shortness of breath.  We could tap the right chest dry and see if that improves her symptoms and if so, we can then place the PleurX catheter    PLAN:   If a PleurX catheter is planned, I would like to have her off Lovenox for 24 hours.  Would also recommend an echo to assess the pericardial effusion and it's  clinical significance.    Nestor Lewandowsky, MD

## 2015-12-17 NOTE — H&P (Addendum)
[place] @ [facility] Admission History and Physical [employer], D.O.  ---------------------------------------------------------------------------------------------------------------------   PATIENT NAME: [name] [name] MR#: [id number] DATE OF BIRTH: 28-Dec-1950 DATE OF ADMISSION: 12/16/2015 PRIMARY CARE PHYSICIAN: No PCP Per Patient  REQUESTING/REFERRING PHYSICIAN: ED Dr. Archie Balboa  CHIEF COMPLAINT: Chief Complaint  Patient presents with  . Shortness of Breath    HISTORY OF PRESENT ILLNESS: [name] [name] is a 65 y.o. female with a known history of Malignant Pleural effusion, left sided inflammatory breast cancer presents the emergency department today complaining of shortness of breath. Patient was recently hospitalized for pleural effusion which was drained via thoracentesis (which was positive for malignant effusion secondary to breast cancer) which quickly improved her symptoms. She had been discharged and plan to see general surgery for placement of a pleural catheter. Since her discharge from the hospital she has had progressively worsening shortness of breath as well as left upper extremity swelling. She states swelling has been there chronically but has been significantly worse, extending down to the forearm and hand for the last 2 days. Hospital medicine was requested for admission to the hospital given tachycardia with ambulation despite improvement in her shortness of breath during her visit in the emergency department.  Of note she was seen in consultation today by Dr. Genevive Bi for consideration of catheter placement. However it was determined that there was minimal fluid seen on her chest x-ray and placement of the catheter was delayed for further review with  [name].  Otherwise there has been no change in status. Patient has been taking medication as prescribed and there has been no recent change in medication or diet.  There has been no recent illness,  travel or sick contacts.    Patient denies fevers/chills, weakness, dizziness, chest pain,  N/V/C/D, abdominal pain, dysuria/frequency, changes in mental status.   EMS/ED COURSE:     PAST MEDICAL HISTORY: Past Medical History:  Diagnosis Date  . Anemia   . Anxiety   . Blood transfusion without reported diagnosis   . Emphysema of lung ([place])   . History of left breast cancer , known active 2013  . Personal history of malignant neoplasm of breast       PAST SURGICAL HISTORY: Past Surgical History:  Procedure Laterality Date  . ABDOMINAL HYSTERECTOMY  1995  . BREAST BIOPSY Left 2014  . BREAST BIOPSY Left 01-21-14  . BREAST SURGERY Right 1996   mastectomy  . mastectomy Right 1997  . NEPHRECTOMY    . oophrectomy  1980      SOCIAL HISTORY: Social History  Substance Use Topics  . Smoking status: Current Every Day Smoker    Packs/day: 0.25    Years: 25.00    Types: Cigarettes  . Smokeless tobacco: Never Used     Comment: 10/29/14-now decreased. smokes 1 pack per week  . Alcohol use No      FAMILY HISTORY: Family History  Problem Relation Age of Onset  . Cancer Father     lung  . Heart disease Sister   . Diabetes Sister   . Heart disease Brother   . Cancer Paternal Grandmother     breast  . Cancer Sister     colon     MEDICATIONS AT HOME: Prior to Admission medications   Medication Sig Start Date End Date Taking? Authorizing Provider  Calcium Carb-Cholecalciferol (CALCIUM 600 + D PO) Take 1 tablet by mouth 2 (two) times daily.   Yes Historical Provider, MD  KLOR-CON 10 10  MEQ tablet TAKE 1 TABLET (10 MEQ TOTAL) BY MOUTH DAILY. 05/25/15  Yes Lequita Asal, MD  LORazepam (ATIVAN) 0.5 MG tablet Take 0.5 mg by mouth daily as needed for anxiety.  08/30/14  Yes Historical Provider, MD  megestrol (MEGACE) 400 MG/10ML suspension Take 5 mLs (200 mg total) by mouth daily. 12/11/15  Yes Lequita Asal, MD  traMADol (ULTRAM) 50 MG tablet 1 - 2 tablets (50 - 100 mg)  every 6 hours as needed for pain Patient taking differently: Take 50-100 mg by mouth every 6 (six) hours as needed for moderate pain.  11/21/15  Yes Lequita Asal, MD  everolimus (AFINITOR) 10 MG tablet Take 10 mg by mouth daily.    Historical Provider, MD  exemestane (AROMASIN) 25 MG tablet Take 25 mg by mouth daily after breakfast.    Historical Provider, MD  feeding supplement, ENSURE ENLIVE, (ENSURE ENLIVE) LIQD Take 237 mLs by mouth 2 (two) times daily between meals. Patient not taking: Reported on 12/17/2015 12/10/15   Lytle Butte, MD  levofloxacin (LEVAQUIN) 750 MG tablet Take 1 tablet (750 mg total) by mouth daily. 12/16/15 12/23/15  Nance Pear, MD      DRUG ALLERGIES: Allergies  Allergen Reactions  . Sulfa Antibiotics Nausea And Vomiting and Other (See Comments)    headache     REVIEW OF SYSTEMS: CONSTITUTIONAL: No fever/chills, fatigue, weakness, weight gain/loss, headache EYES: No blurry or double vision. ENT: No tinnitus, postnasal drip, redness or soreness of the oropharynx. RESPIRATORY: No cough, wheeze, hemoptysis. Positivedyspnea. CARDIOVASCULAR: No chest pain, orthopnea, palpitations, syncope. GASTROINTESTINAL: No nausea, vomiting, constipation, diarrhea, abdominal pain, hematemesis, melena or hematochezia. GENITOURINARY: No dysuria or hematuria. ENDOCRINE: No polyuria or nocturia. No heat or cold intolerance. HEMATOLOGY: No anemia, bruising, bleeding. INTEGUMENTARY: No rashes, ulcers, lesions. MUSCULOSKELETAL: No arthritis, gout. positive left upper extremity swelling NEUROLOGIC: No numbness, tingling, weakness or ataxia. No seizure-type activity. PSYCHIATRIC: No anxiety, depression, insomnia.  PHYSICAL EXAMINATION: VITAL SIGNS: Blood pressure 114/67, pulse (!) 108, temperature 98.4 F (36.9 C), temperature source Oral, resp. rate 17, height 5\' 7"  (1.702 m), weight 71.1 kg (156 lb 11.2 oz), SpO2 99 %.  GENERAL: 65 y.o.-year-old  black female patient,  well-developed, well-nourished lying in the bed in no acute distress.  Pleasant and cooperative.   HEENT: Head atraumatic, normocephalic. Pupils equal, round, reactive to light and accommodation. No scleral icterus. Extraocular muscles intact. Nares are patent. Oropharynx is clear. Mucus membranes moist. NECK: Supple, full range of motion. No JVD, no bruit heard. No thyroid enlargement, no tenderness, no cervical lymphadenopathy. CHEST: Normal breath sounds bilaterally. No wheezing, rales, rhonchi or crackles. No use of accessory muscles of respiration.  No reproducible chest wall tenderness.  CARDIOVASCULAR: S1, S2 normal. No murmurs, rubs, or gallops. Cap refill <2 seconds. ABDOMEN: Soft, nontender, nondistended. No rebound, guarding, rigidity. Normoactive bowel sounds present in all four quadrants. No organomegaly or mass. EXTREMITIES: Full range of motion. No pedal edema, cyanosis, or clubbing. there is puffy nonpitting edema of the entire left upper extremity.  NEUROLOGIC: Cranial nerves II through XII are grossly intact with no focal sensorimotor deficit. Muscle strength 5/5 in all extremities. Sensation intact. Gait not checked. PSYCHIATRIC: The patient is alert and oriented x 3. Normal affect, mood, thought content. SKIN: Warm, dry, and intact without obvious rash, lesion, or ulcer.  LABORATORY PANEL:  CBC  Recent Labs Lab 12/16/15 1710  WBC 8.6  HGB 11.9*  HCT 34.5*  PLT 311   ----------------------------------------------------------------------------------------------------------------- Chemistries  Recent Labs Lab 12/16/15 1710  NA 132*  K 3.7  CL 102  CO2 26  GLUCOSE 92  BUN 14  CREATININE 0.64  CALCIUM 9.1  AST 27  ALT 12*  ALKPHOS 73  BILITOT 0.2*   ------------------------------------------------------------------------------------------------------------------ Cardiac Enzymes  Recent Labs Lab 12/16/15 1710  TROPONINI <0.03    ------------------------------------------------------------------------------------------------------------------  RADIOLOGY: Dg Chest 2 View  Result Date: 12/16/2015 CLINICAL DATA:  65 year old female with shortness upper EXAM: CHEST  2 VIEW COMPARISON:  Chest radiograph dated are 12/15/2015 and CT dated 12/07/2015 FINDINGS: There are small bilateral pleural effusions, grossly similar to prior study. No stable appearing masslike opacity in the left mid lung field corresponds to the fluid seen in the major fissure on the prior CT. There is mild diffuse of the hazy density throughout the left lungs, likely related to atelectatic changes. Superimposed pneumonia is less likely but not excluded. Clinical correlation is recommended. There is no pneumothorax. Stable cardiac silhouette. Right pectoral Port-A-Cath with tip in stable positioning. Multiple surgical clips noted over the right axilla. No acute osseous pathology. IMPRESSION: No significant change in the in small bilateral pleural effusions and fluid in the left major fissure. Overall stable appearance of the lungs. Electronically Signed   By: Anner Crete M.D.   On: 12/16/2015 19:57   Dg Chest 2 View  Result Date: 12/16/2015 CLINICAL DATA:  Recent include crease in fluid retention and shortness of breath, thoracentesis 1 week ago, history of breast malignancy, finished most recent therapy 3 weeks ago. EXAM: CHEST  2 VIEW COMPARISON:  Portable chest x-ray of December 10, 2015 FINDINGS: There is a new right pleural effusion. A small left pleural effusion is stable. There is soft tissue masslike density in the left mid lung not greatly changed from the previous study. The heart is normal in size. There is tortuosity of the ascending and descending thoracic aorta. There is calcification in the wall of the aortic arch. The Port-A-Cath appliance tip projects over the distal third of the SVC. There are surgical clips in the right axillary region.  IMPRESSION: Small bilateral pleural effusions slightly greater on the left than on the right. The findings on the left are not greatly changed but on the right there is been interval reaccumulation of fluid over the past week. Persistent masslike density in the left mid lung. Aortic atherosclerosis Electronically Signed   By: David  Martinique M.D.   On: 12/16/2015 10:52   Ct Angio Chest Pe W And/or Wo Contrast  Result Date: 12/16/2015 CLINICAL DATA:  Shortness of breath. Current history of breast cancer. EXAM: CT ANGIOGRAPHY CHEST WITH CONTRAST TECHNIQUE: Multidetector CT imaging of the chest was performed using the standard protocol during bolus administration of intravenous contrast. Multiplanar CT image reconstructions and MIPs were obtained to evaluate the vascular anatomy. CONTRAST:  75 mL of Isovue 370 intravenously. COMPARISON:  CT scan of December 07, 2015. FINDINGS: No pneumothorax is noted. Moderate right pleural effusion is noted with minimal right basilar subsegmental atelectasis. Continued presence of loculated effusion in left major fissure. Increased opacity is noted in lingular segment of left upper lobe consistent with atelectasis or possibly inflammation. There is no definite evidence of pulmonary embolus. Atherosclerosis of thoracic aorta is noted without aneurysm or dissection. Right internal jugular Port-A-Cath is noted. Multiple nodules are noted in the overlying soft tissues of the left chest consistent with metastatic disease. There is continued presence of fluid within the mediastinum which is stable. Visualized portion of upper abdomen is unremarkable.  No significant osseous abnormality is noted. Review of the MIP images confirms the above findings. IMPRESSION: No definite evidence of pulmonary embolus. Moderate right pleural effusion is noted with minimal right basilar subsegmental atelectasis. Stable loculated effusions seen in left major fissure. Increased opacity is noted in lingular  segment of left upper lobe concerning for atelectasis or possibly inflammation. Aortic atherosclerosis. Multiple nodules are noted in the overlying soft tissues the left chest concerning for metastatic disease. Electronically Signed   By: Marijo Conception, M.D.   On: 12/16/2015 21:04    EKG:  Sinus tachycardia at 110 bpm with normal axis, Q waves in V1 and V2 with nonspecific ST and T wave changes.  IMPRESSION AND PLAN:  This is a 65 y.o. 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 CT of the chest was done to evaluate for PE. There was no evidence of PE however the pleural effusion on the right is noted to be moderate, along with a right basilar subsegmental atelectasis increased opacity in the lingular segment of the left upper lobe consistent with atelectasis or inflammation, stable loculated effusions in the left major fissure.  We will admit the patient for observation, oxygen therapy, DuoNeb's, surgical consultation for consideration of pleural catheter placement for palliative relief of recurrent pleural effusion.  Patient does not have any symptoms of pneumonia, normal white count And has been afebrile, therefore infectious etiology is less likely.   We will also obtain left upper extremity Doppler to rule out DVT given recent worsening of edema. However swelling may be related to lymphedema following breast surgery.    Otherwise we'll continue her regular home medications.   Diet/Nutrition:  Regular Fluids:  Hep-Lock DVT Px: Lovenox, SCDs and early ambulation Code Status: DNR - again confirmed with patient.   All the records are reviewed and case discussed with ED provider. Management plans discussed with the patient and/or family who express understanding and agree with plan of care.   TOTAL TIME TAKING  CARE OF THIS PATIENT: 60 minutes.   [name] [name] D.O. on 12/17/2015 at 1:51 AM Between 7am to 6pm - Pager - [phone] After 6pm go to [email or web address] - [employer] [facility] Office [phone] CC: Primary care physician; No PCP Per Patient     Note: This dictation was prepared with Dragon dictation along with smaller phrase technology. Any transcriptional errors that result from this process are unintentional.

## 2015-12-17 NOTE — Plan of Care (Signed)
Problem: Physical Regulation: Goal: Will remain free from infection Outcome: Not Progressing Known MRSA infection  Comments: Known MRSA infection (R chest wound), wound consult today

## 2015-12-17 NOTE — Care Management (Signed)
Admitted to [facility] with the diagnosis of shortness of breathe under observation status. Friend is Chilton Si [phone]). Discharged from this facility 12/10/15. Goes to the [facility] for services. No home health. No skilled facility. No home oxygen. Takes care of all basic activities of daily living herself. No falls. Decreased appetite for a while. Prescriptions are filled at [facility] on [address]. Possible Pleux cath placement 12/18/15. Varney Baas Hazel Leveille RN MSN CCM Care Management [phone]

## 2015-12-17 NOTE — Consult Note (Signed)
Cardiology Consultation Note  Patient ID: [name] [name], MRN: [id number], DOB/AGE: December 17, 1950 64 y.o. Admit date: 12/16/2015   Date of Consult: 12/17/2015 Primary Physician: No PCP Per Patient Primary Cardiologist: Dr. Fletcher Anon, MD (never seen as an outpatient) Requesting Physician: Dr. Manuella Ghazi, MD  Chief Complaint: SOB Reason for Consult: Elevated troponin in the setting of acute LUE DVT  HPI: 65 y.o. female with h/o recurrent malignant breast cancer s/p radical mastectomy in 1998 s/p radiation, chemotherapy and medical management with Taxotere, Cytoxin, Adrimycin/cyclophosphamide, Aromasin, Afinitor, Femara, letrozole, Ibrance, Taxol, Xeloda, Halaven, and Keytruda. Also with history of anemia s/p prior transfusion, COPD, and anxiety who was just admitted to [facility] in late August for recurrent pleural effusion returned to [facility] for recurrent plural effsuion related to her metastatic breast cancer. She was also found to have an acute LUE DVT. Cardiology is consulted for mildly elevated troponin in the setting of the above.   She previously underwent CT chest on 10/20/15 that showed a trace pericardial effusion. Prior to this study imaging from 06/2015 did not reveal pericardial effusion. Follow up echo on 11/20/15 showed an EF of 50-55%, pericardium was poorly visualized though no evidence of pericardial effusion. Admitted to [facility] in late August with hypoxic respiratory distress 2/2 pleural effusion. Repeat echo on 8/28 showed an EF of 45-50% and a small pericardial effusion without hemodynamic compromise. Plans were for placement of a pleural catheter as an outpatient. She was seen by Dr. Genevive Bi today for evaluation of this cathter. On CXR there was minimal fluid seen on exam, thus this was delayed.   She returned to [facility] on 9/6 with increased SOB felt to be related to her pleural effusion. Upon her arrival to [facility] CTA of the chest was negative for PE, though did show moderate pleural effusion. UE doppler was  positive for acute DVT of the LUE. She has been seen by Dr. Genevive Bi with the plan to proceed with PleurX catheter on the right side in the AM of 9/7. Anticoagulation has been held x 24 hours for the placement of her PleurX cathter. Troponin was slightly elevated in this setting at 0.09, after negative values preceding this. Never with chest pain.   Past Medical History:  Diagnosis Date  . Anemia   . Anxiety   . Blood transfusion without reported diagnosis   . Emphysema of lung ([place])   . History of left breast cancer , known active 2013  . Personal history of malignant neoplasm of breast       Most Recent Cardiac Studies: As above   Surgical History:  Past Surgical History:  Procedure Laterality Date  . ABDOMINAL HYSTERECTOMY  1995  . BREAST BIOPSY Left 2014  . BREAST BIOPSY Left 01-21-14  . BREAST SURGERY Right 1996   mastectomy  . mastectomy Right 1997  . NEPHRECTOMY    . oophrectomy  1980     Home Meds: Prior to Admission medications   Medication Sig Start Date End Date Taking? Authorizing Provider  Calcium Carb-Cholecalciferol (CALCIUM 600 + D PO) Take 1 tablet by mouth 2 (two) times daily.   Yes Historical Provider, MD  KLOR-CON 10 10 MEQ tablet TAKE 1 TABLET (10 MEQ TOTAL) BY MOUTH DAILY. 05/25/15  Yes Lequita Asal, MD  LORazepam (ATIVAN) 0.5 MG tablet Take 0.5 mg by mouth daily as needed for anxiety.  08/30/14  Yes Historical Provider, MD  megestrol (MEGACE) 400 MG/10ML suspension Take 5 mLs (200 mg total) by mouth daily. 12/11/15  Yes  Lequita Asal, MD  traMADol (ULTRAM) 50 MG tablet 1 - 2 tablets (50 - 100 mg) every 6 hours as needed for pain Patient taking differently: Take 50-100 mg by mouth every 6 (six) hours as needed for moderate pain.  11/21/15  Yes Lequita Asal, MD  everolimus (AFINITOR) 10 MG tablet Take 10 mg by mouth daily.    Historical Provider, MD  exemestane (AROMASIN) 25 MG tablet Take 25 mg by mouth daily after breakfast.    Historical  Provider, MD  feeding supplement, ENSURE ENLIVE, (ENSURE ENLIVE) LIQD Take 237 mLs by mouth 2 (two) times daily between meals. Patient not taking: Reported on 12/17/2015 12/10/15   Lytle Butte, MD  levofloxacin (LEVAQUIN) 750 MG tablet Take 1 tablet (750 mg total) by mouth daily. 12/16/15 12/23/15  Nance Pear, MD    Inpatient Medications:  . feeding supplement (ENSURE ENLIVE)  237 mL Oral BID BM  . megestrol  200 mg Oral Daily  . polyethylene glycol  17 g Oral Daily  . potassium chloride  10 mEq Oral Daily      Allergies:  Allergies  Allergen Reactions  . Sulfa Antibiotics Nausea And Vomiting and Other (See Comments)    headache    Social History   Social History  . Marital status: Single    Spouse name: N/A  . Number of children: N/A  . Years of education: N/A   Occupational History  . Not on file.   Social History Main Topics  . Smoking status: Current Every Day Smoker    Packs/day: 0.25    Years: 25.00    Types: Cigarettes  . Smokeless tobacco: Never Used     Comment: 10/29/14-now decreased. smokes 1 pack per week  . Alcohol use No  . Drug use: No  . Sexual activity: Not Currently   Other Topics Concern  . Not on file   Social History Narrative  . No narrative on file     Family History  Problem Relation Age of Onset  . Cancer Father     lung  . Heart disease Sister   . Diabetes Sister   . Heart disease Brother   . Cancer Paternal Grandmother     breast  . Cancer Sister     colon     Review of Systems: Review of Systems  Constitutional: Positive for malaise/fatigue and weight loss. Negative for chills, diaphoresis and fever.  HENT: Negative for congestion.   Eyes: Negative for discharge and redness.  Respiratory: Positive for shortness of breath. Negative for cough, hemoptysis, sputum production and wheezing.   Cardiovascular: Negative for chest pain, palpitations, orthopnea, claudication, leg swelling and PND.  Gastrointestinal: Positive for  nausea. Negative for abdominal pain, heartburn and vomiting.  Musculoskeletal: Negative for falls and myalgias.  Skin: Negative for rash.  Neurological: Positive for weakness. Negative for dizziness, tingling, tremors, sensory change, speech change, focal weakness and loss of consciousness.  Endo/Heme/Allergies: Does not bruise/bleed easily.  Psychiatric/Behavioral: Negative for substance abuse. The patient is not nervous/anxious.   All other systems reviewed and are negative.   Labs:  Recent Labs  12/16/15 1710 12/17/15 0350 12/17/15 1049 12/17/15 1401  TROPONINI <0.03 <0.03 <0.03 0.09*   Lab Results  Component Value Date   WBC 6.8 12/17/2015   HGB 10.9 (L) 12/17/2015   HCT 32.0 (L) 12/17/2015   MCV 89.0 12/17/2015   PLT 280 12/17/2015     Recent Labs Lab 12/16/15 1710 12/17/15 0350  NA 132*  134*  K 3.7 3.5  CL 102 102  CO2 26 24  BUN 14 10  CREATININE 0.64 0.71  CALCIUM 9.1 8.6*  PROT 7.0  --   BILITOT 0.2*  --   ALKPHOS 73  --   ALT 12*  --   AST 27  --   GLUCOSE 92 93   No results found for: CHOL, HDL, LDLCALC, TRIG No results found for: DDIMER  Radiology/Studies:  Dg Chest 1 View  Result Date: 12/08/2015 CLINICAL DATA:  Status post right thoracentesis today. Postprocedural imaging. EXAM: CHEST 1 VIEW COMPARISON:  PA and lateral chest and CT chest 12/07/2015. FINDINGS: Small right pleural effusion seen on the comparison examination is markedly decreased. No pneumothorax. The lungs are emphysematous. Left pleural effusion including a loculated component along the major fissure is unchanged. Left chest wall masses seen on the prior CT are not well demonstrated on this exam. IMPRESSION: Negative for pneumothorax after right thoracentesis. No new abnormality. Electronically Signed   By: Inge Rise M.D.   On: 12/08/2015 11:19   Dg Chest 2 View  Result Date: 12/16/2015 CLINICAL DATA:  65 year old female with shortness upper EXAM: CHEST  2 VIEW COMPARISON:   Chest radiograph dated are 12/15/2015 and CT dated 12/07/2015 FINDINGS: There are small bilateral pleural effusions, grossly similar to prior study. No stable appearing masslike opacity in the left mid lung field corresponds to the fluid seen in the major fissure on the prior CT. There is mild diffuse of the hazy density throughout the left lungs, likely related to atelectatic changes. Superimposed pneumonia is less likely but not excluded. Clinical correlation is recommended. There is no pneumothorax. Stable cardiac silhouette. Right pectoral Port-A-Cath with tip in stable positioning. Multiple surgical clips noted over the right axilla. No acute osseous pathology. IMPRESSION: No significant change in the in small bilateral pleural effusions and fluid in the left major fissure. Overall stable appearance of the lungs. Electronically Signed   By: Anner Crete M.D.   On: 12/16/2015 19:57   Dg Chest 2 View  Result Date: 12/16/2015 CLINICAL DATA:  Recent include crease in fluid retention and shortness of breath, thoracentesis 1 week ago, history of breast malignancy, finished most recent therapy 3 weeks ago. EXAM: CHEST  2 VIEW COMPARISON:  Portable chest x-ray of December 10, 2015 FINDINGS: There is a new right pleural effusion. A small left pleural effusion is stable. There is soft tissue masslike density in the left mid lung not greatly changed from the previous study. The heart is normal in size. There is tortuosity of the ascending and descending thoracic aorta. There is calcification in the wall of the aortic arch. The Port-A-Cath appliance tip projects over the distal third of the SVC. There are surgical clips in the right axillary region. IMPRESSION: Small bilateral pleural effusions slightly greater on the left than on the right. The findings on the left are not greatly changed but on the right there is been interval reaccumulation of fluid over the past week. Persistent masslike density in the left mid  lung. Aortic atherosclerosis Electronically Signed   By: David  Martinique M.D.   On: 12/16/2015 10:52   Dg Chest 2 View  Result Date: 12/07/2015 CLINICAL DATA:  RIGHT-sided mastectomy 20 years prior. LEFT breast cancer recurrence. EXAM: CHEST  2 VIEW COMPARISON:  CT 10/20/2015 FINDINGS: RIGHT port noted. Stable enlarged cardiac silhouette. Increased in fullness along the LEFT suprahilar mediastinum measuring up to 5 cm. There is increase in intrafissural fluid in  the LEFT lung along the oblique fissure. Bilateral small pleural effusions at the lung bases are also increased. No pulmonary edema. No focal infiltrate. IMPRESSION: 1. Increase in volume of loculated fluid along the LEFT oblique fissure. 2. Increased in basilar pleural effusions. 3. Increased fullness in the LEFT suprahilar mediastinum is concerning for loculated fluid versus pleural metastasis. Electronically Signed   By: Suzy Bouchard M.D.   On: 12/07/2015 14:42   Ct Angio Chest Pe W And/or Wo Contrast  Result Date: 12/16/2015 CLINICAL DATA:  Shortness of breath. Current history of breast cancer. EXAM: CT ANGIOGRAPHY CHEST WITH CONTRAST TECHNIQUE: Multidetector CT imaging of the chest was performed using the standard protocol during bolus administration of intravenous contrast. Multiplanar CT image reconstructions and MIPs were obtained to evaluate the vascular anatomy. CONTRAST:  75 mL of Isovue 370 intravenously. COMPARISON:  CT scan of December 07, 2015. FINDINGS: No pneumothorax is noted. Moderate right pleural effusion is noted with minimal right basilar subsegmental atelectasis. Continued presence of loculated effusion in left major fissure. Increased opacity is noted in lingular segment of left upper lobe consistent with atelectasis or possibly inflammation. There is no definite evidence of pulmonary embolus. Atherosclerosis of thoracic aorta is noted without aneurysm or dissection. Right internal jugular Port-A-Cath is noted. Multiple  nodules are noted in the overlying soft tissues of the left chest consistent with metastatic disease. There is continued presence of fluid within the mediastinum which is stable. Visualized portion of upper abdomen is unremarkable. No significant osseous abnormality is noted. Review of the MIP images confirms the above findings. IMPRESSION: No definite evidence of pulmonary embolus. Moderate right pleural effusion is noted with minimal right basilar subsegmental atelectasis. Stable loculated effusions seen in left major fissure. Increased opacity is noted in lingular segment of left upper lobe concerning for atelectasis or possibly inflammation. Aortic atherosclerosis. Multiple nodules are noted in the overlying soft tissues the left chest concerning for metastatic disease. Electronically Signed   By: Marijo Conception, M.D.   On: 12/16/2015 21:04   Ct Angio Chest Pe W And/or Wo Contrast  Result Date: 12/07/2015 CLINICAL DATA:  65 y/o F; history of breast cancer with worsening shortness of breath. EXAM: CT ANGIOGRAPHY CHEST WITH CONTRAST TECHNIQUE: Multidetector CT imaging of the chest was performed using the standard protocol during bolus administration of intravenous contrast. Multiplanar CT image reconstructions and MIPs were obtained to evaluate the vascular anatomy. CONTRAST:  75 cc Isovue 370. COMPARISON:  Chest x-ray 12/07/2015 and CT chest 10/20/2015. FINDINGS: Mediastinum/Lymph Nodes: No pulmonary embolus or thoracic aortic dissection is identified. Moderate pericardial effusion. Normal heart size. Lungs/Pleura: Small right and trace left pleural effusions. Loculated fluid tracking along the left major fissure. Moderate centrilobular emphysema with predominant upper lobe distribution. Minor dependent opacities within the lower lobes are probably atelectasis. Nodularity along the left aspect of the pericardium and left anterior costophrenic angle is partially obscured by pleural fluid and poorly enhancing  due to contrast bolus timing. Upper abdomen: No acute findings. Musculoskeletal: There is a large wound on the left chest, extensive skin thickening and multiple foci infiltrative soft tissue consistent with metastatic disease extending inferiorly below the field of view in the left lateral abdominal wall, extending to the contralateral subcutaneous fat of the right thorax, extending along the sternum, invading the left pectoralis muscle, and invading the left trapezius muscle muscle in the left lateral chest wall. The degree of soft tissue infiltration has increased from the prior CT for example a large left  lateral chest wall lesion measures 54 x 20 mm axially, previously 49 x 60 mm (series 5, image 69). Review of the MIP images confirms the above findings. IMPRESSION: 1. No pulmonary embolus or thoracic aortic dissection is identified. 2. Increase in pericardial effusion, now moderate. 3. Increased pleural fluid with small right and trace left effusions. Increased loculation along the left major fissure Minor associated dependent atelectasis in the lower lobes. 4. Increasing infiltrative neoplasm centered in the left anterolateral chest wall with extension into left pectoralis muscle, left trapezius muscle, left axilla with nodularity along the anterior surface of the sternum, extending to the contralateral right chest wall and left upper abdomen wall. 5. Increased size of numerous skin nodules of anterior chest and abdomen possibly representing metastasis. 6. Possible increase in pleural metastasis along the left heart border and left anterior costophrenic sulcus. 7. Moderate emphysema. Electronically Signed   By: Kristine Garbe M.D.   On: 12/07/2015 19:01   US Venous Img Upper Uni Left  Result Date: 12/17/2015 CLINICAL DATA:  Left upper extremity edema for the past 3 days. History of breast cancer. History of smoking. Evaluate for DVT. EXAM: LEFT UPPER EXTREMITY VENOUS DOPPLER ULTRASOUND TECHNIQUE:  Gray-scale sonography with graded compression, as well as color Doppler and duplex ultrasound were performed to evaluate the upper extremity deep venous system from the level of the subclavian vein and including the jugular, axillary, basilic, radial, ulnar and upper cephalic vein. Spectral Doppler was utilized to evaluate flow at rest and with distal augmentation maneuvers. COMPARISON:  None. FINDINGS: Contralateral Subclavian Vein: Respiratory phasicity is normal and symmetric with the symptomatic side. No evidence of thrombus. Normal compressibility. Internal Jugular Vein: No evidence of thrombus. Normal compressibility, respiratory phasicity and response to augmentation. Subclavian Vein: There is eccentric mixed echogenic nonocclusive thrombus within the peripheral aspect of the left subclavian vein (representative images 7 and 8. Axillary Vein: Hypoechoic expansile occlusive DVT is noted within the left axillary vein (images 10 and 11. Cephalic Vein: No evidence of thrombus. Normal compressibility, respiratory phasicity and response to augmentation. Basilic Vein: No evidence of thrombus. Normal compressibility, respiratory phasicity and response to augmentation. Brachial Veins: There is hypoechoic occlusive thrombus within at least 1 of the 3 brachial veins extending from the proximal to the distal upper arm (representative images 22-24). Radial Veins: No evidence of thrombus. Normal compressibility, respiratory phasicity and response to augmentation. Ulnar Veins: Not well imaged. Other Findings: There is a approximately 2.1 x 0.9 x 1.8 cm fairly well-defined nodule within the left side of the neck which appears to contain a punctate echogenic calcification (representative images 34 through 39). Note is made of an additional benign appearing non pathologically enlarged left supraclavicular lymph node which is not enlarged by size criteria (measuring 0.6 cm in diameter) and maintains a benign fatty hilum (image  43). IMPRESSION: 1. Examination is positive for largely occlusive DVT extending from the peripheral aspect of the left subclavian vein, through the axillary vein and into at least 1 of the brachial veins. 2. Note made of an approximately 2.1 cm nodule within the left side of the neck, indeterminate on this examination though given history of breast cancer, may represent a pathologically enlarged lymph node. Clinical correlation is advised. These results will be called to the ordering clinician or representative by the Radiologist Assistant, and communication documented in the PACS or zVision Dashboard. Electronically Signed   By: Sandi Mariscal M.D.   On: 12/17/2015 11:16   Dg Chest [facility]  Result Date: 12/10/2015 CLINICAL DATA:  Shortness of breath. EXAM: PORTABLE CHEST 1 VIEW COMPARISON:  December 08, 2015 chest radiograph and chest CT December 07, 2015 FINDINGS: Port-A-Cath tip is in the superior vena cava,, stable. No pneumothorax. There is new patchy infiltrate in the right base. These sizable mass in the left mid lung region is stable. There is diffuse interstitial prominence on the left, indicative of asymmetric edema. Heart size is normal. There is a small left effusion which appears at least partially loculated. Soft tissue prominence in the left perihilar region remains which may be due to pericardial effusion based on the recent CT appearance. No change in the mediastinal or hilar contours. No bone lesions evident. There are surgical clips in the right axilla. IMPRESSION: New patchy infiltrate right base. Mass with asymmetric edema on the left appears stable. Small partially loculated left effusion. Soft tissue prominence the left perihilar region is stable. Question pericardial effusion based on CT appearance. No pneumothorax. Electronically Signed   By: Lowella Grip III M.D.   On: 12/10/2015 07:59   US Thoracentesis Asp Pleural Space W/img Guide  Result Date: 12/08/2015 INDICATION:  Thoracentesis EXAM: ULTRASOUND GUIDED RIGHT THORACENTESIS MEDICATIONS: None. COMPLICATIONS: None immediate. PROCEDURE: An ultrasound guided thoracentesis was thoroughly discussed with the patient and questions answered. The benefits, risks, alternatives and complications were also discussed. The patient understands and wishes to proceed with the procedure. Written consent was obtained. Ultrasound was performed to localize and mark an adequate pocket of fluid in the right chest. The area was then prepped and draped in the normal sterile fashion. 1% Lidocaine was used for local anesthesia. Under ultrasound guidance a Safe-T-Centesis catheter was introduced. Thoracentesis was performed. The catheter was removed and a dressing applied. FINDINGS: A total of approximately 700 cc of clear yellow fluid was removed. Samples were sent to the laboratory as requested by the clinical team. IMPRESSION: Successful ultrasound guided right thoracentesis yielding 700 cc of pleural fluid. Electronically Signed   By: Marybelle Killings M.D.   On: 12/08/2015 12:47    EKG: Interpreted by me showed: sinus tachycardia, 110 bpm, nonspecific st/t changes   Weights: Filed Weights   12/16/15 1652 12/17/15 0123  Weight: 150 lb (68 kg) 156 lb 11.2 oz (71.1 kg)     Physical Exam: Blood pressure 110/64, pulse (!) 108, temperature 98.5 F (36.9 C), temperature source Oral, resp. rate 18, height 5\' 7"  (1.702 m), weight 156 lb 11.2 oz (71.1 kg), SpO2 100 %. Body mass index is 24.54 kg/m. General: Frail appearing, in no acute distress. Head: Normocephalic, atraumatic, sclera non-icteric, no xanthomas, nares are without discharge.  Neck: Negative for carotid bruits. JVD not elevated. Lungs: Clear bilaterally to auscultation without wheezes, rales, or rhonchi. Breathing is unlabored. Heart: RRR with S1 S2. No murmurs, rubs, or gallops appreciated. Abdomen: Soft, non-tender, non-distended with normoactive bowel sounds. No hepatomegaly. No  rebound/guarding. No obvious abdominal masses. Msk:  Strength and tone appear normal for age. Extremities: No clubbing or cyanosis. No edema. Distal pedal pulses are 2+ and equal bilaterally. Neuro: Alert and oriented X 3. No facial asymmetry. No focal deficit. Moves all extremities spontaneously. Psych:  Responds to questions appropriately with a normal affect.    Assessment and Plan:  Principal Problem:   Pericardial effusion Active Problems:   Metastatic breast cancer ([place])   DVT of axillary vein, acute left   Protein-calorie malnutrition, severe   Shortness of breath   Demand ischemia ([place])    1. Demand ischemia: -  Minimally elevated troponin likely in the setting of the patient's acute LUE DVT as well as recurrent pleural effusion -Never with chest pain -No plans for ischemic work up  2. Recurrent pleural effusion: -Planning for PleurX catheter on 9/7 -Per primary service  3. Metastatic breast cancer: -Per IM/oncology  4. Systolic dysfunction: -Continue medical management    Signed, Marcille Blanco [place] Pager: [phone] 12/17/2015, 4:30 PM

## 2015-12-17 NOTE — Progress Notes (Signed)
Port would not flush or give blood return, shortly after blood draw and prior flush.  Offer to re- access port patient declined at this time , has peripheral IV site

## 2015-12-18 ENCOUNTER — Encounter
Admission: EM | Disposition: A | Discharge status: Hospice | Payer: Self-pay | Source: Home / Self Care | Attending: Internal Medicine

## 2015-12-18 ENCOUNTER — Inpatient Hospital Stay: Payer: Medicare Other | Admitting: Hematology and Oncology

## 2015-12-18 ENCOUNTER — Observation Stay: Payer: Medicare Other | Admitting: Anesthesiology

## 2015-12-18 ENCOUNTER — Encounter: Payer: Self-pay | Admitting: *Deleted

## 2015-12-18 ENCOUNTER — Ambulatory Visit: Payer: Medicare Other | Admitting: Radiation Oncology

## 2015-12-18 ENCOUNTER — Observation Stay: Payer: Medicare Other

## 2015-12-18 DIAGNOSIS — E43 Unspecified severe protein-calorie malnutrition: Secondary | ICD-10-CM | POA: Diagnosis present

## 2015-12-18 DIAGNOSIS — Z801 Family history of malignant neoplasm of trachea, bronchus and lung: Secondary | ICD-10-CM | POA: Diagnosis not present

## 2015-12-18 DIAGNOSIS — Z9221 Personal history of antineoplastic chemotherapy: Secondary | ICD-10-CM | POA: Diagnosis not present

## 2015-12-18 DIAGNOSIS — F419 Anxiety disorder, unspecified: Secondary | ICD-10-CM | POA: Diagnosis present

## 2015-12-18 DIAGNOSIS — Z923 Personal history of irradiation: Secondary | ICD-10-CM | POA: Diagnosis not present

## 2015-12-18 DIAGNOSIS — Z66 Do not resuscitate: Secondary | ICD-10-CM | POA: Diagnosis present

## 2015-12-18 DIAGNOSIS — Z8249 Family history of ischemic heart disease and other diseases of the circulatory system: Secondary | ICD-10-CM | POA: Diagnosis not present

## 2015-12-18 DIAGNOSIS — I82A12 Acute embolism and thrombosis of left axillary vein: Secondary | ICD-10-CM | POA: Diagnosis present

## 2015-12-18 DIAGNOSIS — I313 Pericardial effusion (noninflammatory): Secondary | ICD-10-CM | POA: Diagnosis present

## 2015-12-18 DIAGNOSIS — Z833 Family history of diabetes mellitus: Secondary | ICD-10-CM | POA: Diagnosis not present

## 2015-12-18 DIAGNOSIS — R000 Tachycardia, unspecified: Secondary | ICD-10-CM | POA: Diagnosis present

## 2015-12-18 DIAGNOSIS — J449 Chronic obstructive pulmonary disease, unspecified: Secondary | ICD-10-CM | POA: Diagnosis present

## 2015-12-18 DIAGNOSIS — D649 Anemia, unspecified: Secondary | ICD-10-CM | POA: Diagnosis present

## 2015-12-18 DIAGNOSIS — Z79818 Long term (current) use of other agents affecting estrogen receptors and estrogen levels: Secondary | ICD-10-CM | POA: Diagnosis not present

## 2015-12-18 DIAGNOSIS — Z515 Encounter for palliative care: Secondary | ICD-10-CM | POA: Diagnosis not present

## 2015-12-18 DIAGNOSIS — Z800 Family history of malignant neoplasm of digestive organs: Secondary | ICD-10-CM | POA: Diagnosis not present

## 2015-12-18 DIAGNOSIS — I248 Other forms of acute ischemic heart disease: Secondary | ICD-10-CM | POA: Diagnosis present

## 2015-12-18 DIAGNOSIS — F17210 Nicotine dependence, cigarettes, uncomplicated: Secondary | ICD-10-CM | POA: Diagnosis present

## 2015-12-18 DIAGNOSIS — E871 Hypo-osmolality and hyponatremia: Secondary | ICD-10-CM | POA: Diagnosis present

## 2015-12-18 DIAGNOSIS — I739 Peripheral vascular disease, unspecified: Secondary | ICD-10-CM | POA: Diagnosis present

## 2015-12-18 DIAGNOSIS — C50912 Malignant neoplasm of unspecified site of left female breast: Secondary | ICD-10-CM | POA: Diagnosis present

## 2015-12-18 DIAGNOSIS — Z803 Family history of malignant neoplasm of breast: Secondary | ICD-10-CM | POA: Diagnosis not present

## 2015-12-18 DIAGNOSIS — R0602 Shortness of breath: Secondary | ICD-10-CM | POA: Diagnosis present

## 2015-12-18 DIAGNOSIS — I319 Disease of pericardium, unspecified: Secondary | ICD-10-CM | POA: Diagnosis not present

## 2015-12-18 DIAGNOSIS — J910 Malignant pleural effusion: Secondary | ICD-10-CM | POA: Diagnosis present

## 2015-12-18 DIAGNOSIS — Z170 Estrogen receptor positive status [ER+]: Secondary | ICD-10-CM | POA: Diagnosis not present

## 2015-12-18 HISTORY — PX: CHEST TUBE INSERTION: SHX231

## 2015-12-18 LAB — TROPONIN I: Troponin I: <0.03 ng/mL (ref <0.03)

## 2015-12-18 SURGERY — CHEST TUBE INSERTION
Anesthesia: General | Laterality: Right | Wound class: Clean Contaminated

## 2015-12-18 MED ORDER — ENOXAPARIN SODIUM 80 MG/0.8ML ~~LOC~~ SOLN
70.0000 mg | Freq: Two times a day (BID) | SUBCUTANEOUS | Status: DC
  Administered 2015-12-18 – 2015-12-19 (×2): 70 mg via SUBCUTANEOUS
  Filled 2015-12-18 (×2): qty 0.8

## 2015-12-18 MED ORDER — PHENYLEPHRINE HCL 10 MG/ML IJ SOLN
INTRAMUSCULAR | Status: DC | PRN
  Administered 2015-12-18: 50 ug via INTRAVENOUS
  Administered 2015-12-18: 100 ug via INTRAVENOUS

## 2015-12-18 MED ORDER — DEXAMETHASONE SODIUM PHOSPHATE 10 MG/ML IJ SOLN
INTRAMUSCULAR | Status: DC | PRN
  Administered 2015-12-18: 8 mg via INTRAVENOUS

## 2015-12-18 MED ORDER — APIXABAN 5 MG PO TABS
10.0000 mg | ORAL_TABLET | Freq: Two times a day (BID) | ORAL | Status: DC
  Administered 2015-12-18: 10 mg via ORAL
  Filled 2015-12-18: qty 2

## 2015-12-18 MED ORDER — ROCURONIUM BROMIDE 100 MG/10ML IV SOLN
INTRAVENOUS | Status: DC | PRN
  Administered 2015-12-18: 15 mg via INTRAVENOUS
  Administered 2015-12-18: 5 mg via INTRAVENOUS

## 2015-12-18 MED ORDER — PROPOFOL 10 MG/ML IV BOLUS
INTRAVENOUS | Status: DC | PRN
  Administered 2015-12-18: 100 mg via INTRAVENOUS
  Administered 2015-12-18 (×2): 10 mg via INTRAVENOUS
  Administered 2015-12-18: 30 mg via INTRAVENOUS

## 2015-12-18 MED ORDER — LACTATED RINGERS IV SOLN
INTRAVENOUS | Status: DC | PRN
  Administered 2015-12-18: 10:00:00 via INTRAVENOUS

## 2015-12-18 MED ORDER — APIXABAN 5 MG PO TABS: 10.0000 mg | ORAL_TABLET | Freq: Two times a day (BID) | ORAL | Status: DC

## 2015-12-18 MED ORDER — APIXABAN 5 MG PO TABS: 5.0000 mg | ORAL_TABLET | Freq: Two times a day (BID) | ORAL | Status: DC

## 2015-12-18 MED ORDER — FENTANYL CITRATE (PF) 100 MCG/2ML IJ SOLN
INTRAMUSCULAR | Status: DC | PRN
  Administered 2015-12-18: 50 ug via INTRAVENOUS
  Administered 2015-12-18: 100 ug via INTRAVENOUS
  Administered 2015-12-18: 50 ug via INTRAVENOUS

## 2015-12-18 MED ORDER — LIDOCAINE HCL (CARDIAC) 20 MG/ML IV SOLN
INTRAVENOUS | Status: DC | PRN
  Administered 2015-12-18: 40 mg via INTRAVENOUS

## 2015-12-18 MED ORDER — MIDAZOLAM HCL 2 MG/2ML IJ SOLN
INTRAMUSCULAR | Status: DC | PRN
  Administered 2015-12-18 (×2): 1 mg via INTRAVENOUS

## 2015-12-18 MED ORDER — SUGAMMADEX SODIUM 200 MG/2ML IV SOLN: INTRAVENOUS | Status: DC | PRN

## 2015-12-18 MED ORDER — OXYCODONE-ACETAMINOPHEN 5-325 MG PO TABS
1.0000 | ORAL_TABLET | ORAL | Status: DC | PRN
  Administered 2015-12-18 – 2015-12-19 (×3): 1 via ORAL
  Filled 2015-12-18 (×3): qty 1

## 2015-12-18 MED ORDER — FENTANYL CITRATE (PF) 100 MCG/2ML IJ SOLN: 25.0000 ug | INTRAMUSCULAR | Status: DC | PRN

## 2015-12-18 MED ORDER — ONDANSETRON HCL 4 MG/2ML IJ SOLN: 4.0000 mg | Freq: Once | INTRAMUSCULAR | Status: DC | PRN

## 2015-12-18 MED ORDER — SUGAMMADEX SODIUM 200 MG/2ML IV SOLN
INTRAVENOUS | Status: DC | PRN
  Administered 2015-12-18: 150 mg via INTRAVENOUS

## 2015-12-18 MED ORDER — EPHEDRINE SULFATE 50 MG/ML IJ SOLN
INTRAMUSCULAR | Status: DC | PRN
  Administered 2015-12-18: 15 mg via INTRAVENOUS

## 2015-12-18 MED ORDER — LIDOCAINE HCL (PF) 1 % IJ SOLN
INTRAMUSCULAR | Status: DC | PRN
  Administered 2015-12-18: 8 mL

## 2015-12-18 SURGICAL SUPPLY — 35 items
BLADE SURG SZ11 CARB STEEL (BLADE) ×2 IMPLANT
CANISTER SUCT 1200ML W/VALVE (MISCELLANEOUS) ×2 IMPLANT
CHLORAPREP W/TINT 26ML (MISCELLANEOUS) ×2 IMPLANT
DRAIN CHEST DRY SUCT SGL (MISCELLANEOUS) ×2 IMPLANT
DRAPE LAPAROTOMY 77X122 PED (DRAPES) ×2 IMPLANT
ELECT REM PT RETURN 9FT ADLT (ELECTROSURGICAL) ×2
ELECTRODE REM PT RTRN 9FT ADLT (ELECTROSURGICAL) ×1 IMPLANT
GAUZE SPONGE 4X4 12PLY STRL (GAUZE/BANDAGES/DRESSINGS) ×2 IMPLANT
GLOVE SURG SYN 7.5  E (GLOVE) ×6
GLOVE SURG SYN 7.5 E (GLOVE) ×6 IMPLANT
GOWN STRL REUS W/ TWL LRG LVL3 (GOWN DISPOSABLE) ×3 IMPLANT
GOWN STRL REUS W/TWL LRG LVL3 (GOWN DISPOSABLE) ×3
KIT PLEURX DRAIN CATH 15.5FR (DRAIN) ×2 IMPLANT
KIT RM TURNOVER STRD PROC AR (KITS) ×2 IMPLANT
LABEL OR SOLS (LABEL) ×2 IMPLANT
MARKER SKIN DUAL TIP RULER LAB (MISCELLANEOUS) ×2 IMPLANT
PACK BASIN MINOR ARMC (MISCELLANEOUS) ×2 IMPLANT
SUCTION FRAZIER HANDLE 10FR (MISCELLANEOUS)
SUCTION TUBE FRAZIER 10FR DISP (MISCELLANEOUS) IMPLANT
SUT ETH BLK MONO 3 0 FS 1 12/B (SUTURE) ×2 IMPLANT
SUT ETHILON 3-0 FS-10 30 BLK (SUTURE) ×2
SUT ETHILON 4-0 (SUTURE)
SUT ETHILON 4-0 FS2 18XMFL BLK (SUTURE)
SUT SILK 0 (SUTURE) ×1
SUT SILK 0 30XBRD TIE 6 (SUTURE) ×1 IMPLANT
SUT SILK 1 SH (SUTURE) ×2 IMPLANT
SUT VIC AB 0 SH 27 (SUTURE) ×2 IMPLANT
SUT VIC AB 2-0 SH 27 (SUTURE) ×1
SUT VIC AB 2-0 SH 27XBRD (SUTURE) ×1 IMPLANT
SUT VIC AB 3-0 SH 27 (SUTURE)
SUT VIC AB 3-0 SH 27X BRD (SUTURE) IMPLANT
SUTURE EHLN 3-0 FS-10 30 BLK (SUTURE) ×1 IMPLANT
SUTURE ETHLN 4-0 FS2 18XMF BLK (SUTURE) IMPLANT
SYR 30ML LL (SYRINGE) IMPLANT
SYRINGE 10CC LL (SYRINGE) IMPLANT

## 2015-12-18 NOTE — Progress Notes (Signed)
Anticoagulation monitoring  65 yo female (70.8 kg) with new LUE DVT starting on apixaban. Pt s/p chest tube placement this AM. SCr 0.71, CrCl ~ 71 ml/min.   Per Dr. Manuella Ghazi ok to start apixaban now s/p chest tube placement.   Apixaban 10 mg PO BID x 7 days then 5 mg PO BID.

## 2015-12-18 NOTE — Consult Note (Signed)
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  Known to [facility] team Pressure Ulcer POA: N/A Measurement:6 cm x 10 cm unable to visualize wound bed.  Yellow, devitalized tissue to wound bed.  Wound bed:100% devitalized tissue.  Drainage (amount, consistency, odor) Moderate serosanguinous effluent.  Foul, pungent odor.  Periwound:Intact.  Medical Adhesive Related Skin Injury (MARSI) 9:00 Dressing procedure/placement/frequency:Cleanse CA lesion to left breast with NS and pat gently dry.  Apply Aquacel Ag to wound bed.  Cover with ABD pad and tape.  Change daily . Will not follow at this time.  Please re-consult if needed.  Domenic Moras RN BSN [facility] Pager [phone]

## 2015-12-18 NOTE — Op Note (Signed)
  12/16/2015 - 12/18/2015  11:36 AM  PATIENT:  [name] [name]  65 y.o. female  PRE-OPERATIVE DIAGNOSIS:  Malignant pleural effusion  POST-OPERATIVE DIAGNOSIS:  same  PROCEDURE:  Insertion of Pleurx catheter  SURGEON:  Surgeon(s) and Role:    * Nestor Lewandowsky, MD - Primary  ASSISTANTS: Jen Mow PA-S  ANESTHESIA: LMA  INDICATIONS FOR PROCEDURE recurrent symptomatic pleural effusion  DICTATION: Patient was brought to the operating suite and placed in the supine position. Laryngeal mask airway was established. The patient was turned with the right side up and patient was prepped and draped in usual sterile fashion. Local anesthesia was used to provide some adequate pain control postoperatively. A 1.5 cm skin incision was made and carried down through the muscles of the chest wall total pleural space was entered. The Pleurx catheter was tunneled from an anterior site and positioned into the pleural space. Immediately there was drainage of clear slightly yellow fluid. The catheter was secured to the skin with 0 silk. The muscles of the chest were closed over the catheter with 0 Vicryl subcutaneous tissues with 2-0 Vicryl and the skin with nylon. Patient was turned in the supine position where she was then extubated and taken to the recovery room in stable condition. All sponge needle and instrument counts were correct as reported to me at the end of the case.   Nestor Lewandowsky, MD

## 2015-12-18 NOTE — Progress Notes (Signed)
Received call from Hubert Azure about the the patient beng on contact isolation on 12/17/2015. She stated the patient had a mrsa pcr neg and did not need to be on contact isolation

## 2015-12-18 NOTE — Progress Notes (Signed)
Dr Mike Gip made aware that pt HR maintaining in the 124-130 range, states to monitor pts pain and to continue to monitor HR

## 2015-12-18 NOTE — Care Management (Signed)
Spoke with Dr. Janece Canterbury. Will need hospice services in the home. Spoke with Ms. Galliher at the bedside. [name] [name]. Will update Flo Shanks RN representative for [employer]. Varney Baas Zamyiah Tino RN MSN CCM Care Management [phone]

## 2015-12-18 NOTE — Progress Notes (Signed)
ANTICOAGULATION CONSULT NOTE - Initial Consult  Pharmacy Consult for Lovenox  Indication: VTE treatment  Allergies  Allergen Reactions  . Sulfa Antibiotics Nausea And Vomiting and Other (See Comments)    headache    Patient Measurements: Height: 5\' 7"  (170.2 cm) Weight: 156 lb (70.8 kg) IBW/kg (Calculated) : 61.6 Heparin Dosing Weight:   Vital Signs: Temp: 98.3 F (36.8 C) (09/07 1252) Temp Source: Oral (09/07 1252) BP: 110/72 (09/07 1252) Pulse Rate: 110 (09/07 1252)  Labs:  Recent Labs  12/16/15 1710 12/17/15 0350  12/17/15 1550 12/17/15 1946 12/17/15 2344  HGB 11.9* 10.9*  --   --   --   --   HCT 34.5* 32.0*  --   --   --   --   PLT 311 280  --   --   --   --   APTT  --   --   --  26  --   --   LABPROT  --   --   --  15.2  --   --   INR  --   --   --  1.19  --   --   CREATININE 0.64 0.71  --   --   --   --   TROPONINI <0.03 <0.03  < > <0.03 <0.03 <0.03  < > = values in this interval not displayed.  Estimated Creatinine Clearance: 70.9 mL/min (by C-G formula based on SCr of 0.8 mg/dL).   Medical History: Past Medical History:  Diagnosis Date  . Anemia   . Anxiety   . Blood transfusion without reported diagnosis   . Emphysema of lung ([place])   . History of left breast cancer , known active 2013  . Personal history of malignant neoplasm of breast     Medications:  Prescriptions Prior to Admission  Medication Sig Dispense Refill Last Dose  . Calcium Carb-Cholecalciferol (CALCIUM 600 + D PO) Take 1 tablet by mouth 2 (two) times daily.   12/15/15 at 900  . KLOR-CON 10 10 MEQ tablet TAKE 1 TABLET (10 MEQ TOTAL) BY MOUTH DAILY. 30 tablet 1 12/15/15 at 900  . LORazepam (ATIVAN) 0.5 MG tablet Take 0.5 mg by mouth daily as needed for anxiety.   0 Past Week at Unknown time  . megestrol (MEGACE) 400 MG/10ML suspension Take 5 mLs (200 mg total) by mouth daily. 150 mL 1 Past Week at 900  . traMADol (ULTRAM) 50 MG tablet 1 - 2 tablets (50 - 100 mg) every 6 hours as needed  for pain (Patient taking differently: Take 50-100 mg by mouth every 6 (six) hours as needed for moderate pain. ) 90 tablet 0 12/16/2015 at 900  . everolimus (AFINITOR) 10 MG tablet Take 10 mg by mouth daily.   Not Taking at Unknown time  . exemestane (AROMASIN) 25 MG tablet Take 25 mg by mouth daily after breakfast.   Not Taking at Unknown time  . feeding supplement, ENSURE ENLIVE, (ENSURE ENLIVE) LIQD Take 237 mLs by mouth 2 (two) times daily between meals. (Patient not taking: Reported on 12/17/2015) 237 mL 12 Not Taking at Unknown time    Assessment: Pharmacy consulted to dose lovenox in this 65 year old female with DVT.  Pt on Eliquis at home , last dose on 9/7 AM. TBW = 70.8 kg CrCl = 70.9 ml/min  Goal of Therapy:  resolutuion of DVT  Monitor platelets by anticoagulation protocol: Yes   Plan:  Lovenox 70 mg SQ Q12H ordered  to start 9/7 @22 :00. Will check platelets daily.   Tyson Masin D 12/18/2015,6:36 PM

## 2015-12-18 NOTE — Anesthesia Postprocedure Evaluation (Signed)
Anesthesia Post Note  Patient: [name] [name]  Procedure(s) Performed: Procedure(s) (LRB): PLEURX CATHETER INSERTION (Right)  Patient location during evaluation: PACU Anesthesia Type: General Level of consciousness: awake and alert Pain management: pain level controlled Vital Signs Assessment: post-procedure vital signs reviewed and stable Respiratory status: spontaneous breathing and respiratory function stable Cardiovascular status: stable Anesthetic complications: no    Last Vitals:  Vitals:   12/18/15 1151 12/18/15 1200  BP:  135/84  Pulse: (!) 113 (!) 115  Resp: 15 16  Temp:      Last Pain:  Vitals:   12/18/15 0933  TempSrc: Tympanic  PainSc:                  KEPHART,WILLIAM K

## 2015-12-18 NOTE — Progress Notes (Signed)
PT Cancellation Note  Patient Details Name: TEIONA SOLIVAN MRN: [id number] DOB: 1950/08/25   Cancelled Treatment:    Reason Eval/Treat Not Completed: Patient at procedure or test/unavailable (Consult received.  Patient currently off unit for diagnostic procedure (pleurex catheter placement).  Will re-attempt at later date/time as patient medically appropriate and available.)   Joniya Boberg H. Owens Shark, PT, DPT, NCS 12/18/15, 10:38 AM [phone]

## 2015-12-18 NOTE — Progress Notes (Signed)
[facility] [place] NAME: [name] [name]    MR#:  [id number]  DATE OF BIRTH:  04-May-1950  SUBJECTIVE:  CHIEF COMPLAINT:   Chief Complaint  Patient presents with  . Shortness of Breath  feels better, swollen LUE, waiting for PleurX placement this am REVIEW OF SYSTEMS:  Review of Systems  Constitutional: Negative for chills, fever and weight loss.  HENT: Negative for nosebleeds and sore throat.   Eyes: Negative for blurred vision.  Respiratory: Positive for shortness of breath. Negative for cough and wheezing.   Cardiovascular: Negative for chest pain, orthopnea, leg swelling and PND.  Gastrointestinal: Negative for abdominal pain, constipation, diarrhea, heartburn, nausea and vomiting.  Genitourinary: Negative for dysuria and urgency.  Musculoskeletal: Negative for back pain.  Skin: Negative for rash.  Neurological: Negative for dizziness, speech change, focal weakness and headaches.  Endo/Heme/Allergies: Does not bruise/bleed easily.  Psychiatric/Behavioral: Negative for depression.   DRUG ALLERGIES:   Allergies  Allergen Reactions  . Sulfa Antibiotics Nausea And Vomiting and Other (See Comments)    headache   VITALS:  Blood pressure 110/67, pulse (!) 108, temperature 98.2 F (36.8 C), temperature source Oral, resp. rate 18, height 5\' 7"  (1.702 m), weight 71.1 kg (156 lb 11.2 oz), SpO2 99 %. PHYSICAL EXAMINATION:  Physical Exam  Constitutional: She is oriented to person, place, and time and well-developed, well-nourished, and in no distress.  HENT:  Head: Normocephalic and atraumatic.  Eyes: Conjunctivae and EOM are normal. Pupils are equal, round, and reactive to light.  Neck: Normal range of motion. Neck supple. No tracheal deviation present. No thyromegaly present.  Cardiovascular: Normal rate, regular rhythm and normal heart sounds.   Pulmonary/Chest: Effort normal and breath sounds normal. No respiratory distress. She  has no wheezes. She exhibits no tenderness.  Abdominal: Soft. Bowel sounds are normal. She exhibits no distension. There is no tenderness.  Musculoskeletal: Normal range of motion.       Left forearm: She exhibits swelling and edema.  Swollen LUE  Neurological: She is alert and oriented to person, place, and time. No cranial nerve deficit.  Skin: Skin is warm and dry. No rash noted.  Psychiatric: Mood and affect normal.   LABORATORY PANEL:   CBC  Recent Labs Lab 12/17/15 0350  WBC 6.8  HGB 10.9*  HCT 32.0*  PLT 280   ------------------------------------------------------------------------------------------------------------------ Chemistries   Recent Labs Lab 12/16/15 1710 12/17/15 0350  NA 132* 134*  K 3.7 3.5  CL 102 102  CO2 26 24  GLUCOSE 92 93  BUN 14 10  CREATININE 0.64 0.71  CALCIUM 9.1 8.6*  MG  --  2.0  AST 27  --   ALT 12*  --   ALKPHOS 73  --   BILITOT 0.2*  --    RADIOLOGY:  US Venous Img Upper Uni Left  Result Date: 12/17/2015 CLINICAL DATA:  Left upper extremity edema for the past 3 days. History of breast cancer. History of smoking. Evaluate for DVT. EXAM: LEFT UPPER EXTREMITY VENOUS DOPPLER ULTRASOUND TECHNIQUE: Gray-scale sonography with graded compression, as well as color Doppler and duplex ultrasound were performed to evaluate the upper extremity deep venous system from the level of the subclavian vein and including the jugular, axillary, basilic, radial, ulnar and upper cephalic vein. Spectral Doppler was utilized to evaluate flow at rest and with distal augmentation maneuvers. COMPARISON:  None. FINDINGS: Contralateral Subclavian Vein: Respiratory phasicity is normal and symmetric with the symptomatic side.  No evidence of thrombus. Normal compressibility. Internal Jugular Vein: No evidence of thrombus. Normal compressibility, respiratory phasicity and response to augmentation. Subclavian Vein: There is eccentric mixed echogenic nonocclusive thrombus  within the peripheral aspect of the left subclavian vein (representative images 7 and 8. Axillary Vein: Hypoechoic expansile occlusive DVT is noted within the left axillary vein (images 10 and 11. Cephalic Vein: No evidence of thrombus. Normal compressibility, respiratory phasicity and response to augmentation. Basilic Vein: No evidence of thrombus. Normal compressibility, respiratory phasicity and response to augmentation. Brachial Veins: There is hypoechoic occlusive thrombus within at least 1 of the 3 brachial veins extending from the proximal to the distal upper arm (representative images 22-24). Radial Veins: No evidence of thrombus. Normal compressibility, respiratory phasicity and response to augmentation. Ulnar Veins: Not well imaged. Other Findings: There is a approximately 2.1 x 0.9 x 1.8 cm fairly well-defined nodule within the left side of the neck which appears to contain a punctate echogenic calcification (representative images 34 through 39). Note is made of an additional benign appearing non pathologically enlarged left supraclavicular lymph node which is not enlarged by size criteria (measuring 0.6 cm in diameter) and maintains a benign fatty hilum (image 43). IMPRESSION: 1. Examination is positive for largely occlusive DVT extending from the peripheral aspect of the left subclavian vein, through the axillary vein and into at least 1 of the brachial veins. 2. Note made of an approximately 2.1 cm nodule within the left side of the neck, indeterminate on this examination though given history of breast cancer, may represent a pathologically enlarged lymph node. Clinical correlation is advised. These results will be called to the ordering clinician or representative by the Radiologist Assistant, and communication documented in the PACS or zVision Dashboard. Electronically Signed   By: Sandi Mariscal M.D.   On: 12/17/2015 11:16   ASSESSMENT AND PLAN:  This is a 65 y.o. female with a history of  known  inflammatory breast cancer with metastatic disease and malignant pleural effusion on the right now being admitted with:  * Small/moderate recurrent pleural effusion. CT of the chest neg for PE.  - d/w Dr Genevive Bi who will plan PleurX cathetar today as she is symptomatic (SOB) and no other obvious etio for her SOB  * Lt UE DVT - will start anticoagulation (xarelto/eliquis) after procedure today. - Onco c/s pending  * h/o breast CA: - Onco c/s pending  * Hyponatremia - improving with hydration  * weakness: PT c/s   All the records are reviewed and case discussed with Care Management/Social Worker. Management plans discussed with the patient, family and they are in agreement.  CODE STATUS: FULL CODE  TOTAL TIME TAKING CARE OF THIS PATIENT: 35 minutes.   More than 50% of the time was spent in counseling/coordination of care: YES  POSSIBLE D/C IN 1-2 DAYS, DEPENDING ON CLINICAL CONDITION.   [facility], Tanayia Wahlquist M.D on 12/18/2015 at 8:21 AM  Between 7am to 6pm - Pager - [phone]  After 6pm go to [email or web address] - [employer] [facility]  Office  [phone]  CC: Primary care physician; No PCP Per Patient  Note: This dictation was prepared with Dragon dictation along with smaller phrase technology. Any transcriptional errors that result from this process are unintentional.

## 2015-12-18 NOTE — Transfer of Care (Signed)
Immediate Anesthesia Transfer of Care Note  Patient: [name] [name]  Procedure(s) Performed: Procedure(s): PLEURX CATHETER INSERTION (Right)  Patient Location: PACU  Anesthesia Type:General  Level of Consciousness: sedated and unresponsive  Airway & Oxygen Therapy: Patient Spontanous Breathing and Patient connected to face mask oxygen  Post-op Assessment: Report given to RN and Post -op Vital signs reviewed and stable  Post vital signs: Reviewed and stable  Last Vitals:  Vitals:   12/18/15 0933 12/18/15 1130  BP: 107/79 116/76  Pulse: (!) 111 (!) 107  Resp: 16 15  Temp: (!) 35.7 C 36.7 C    Last Pain:  Vitals:   12/18/15 0933  TempSrc: Tympanic  PainSc:          Complications: No apparent anesthesia complications

## 2015-12-18 NOTE — Care Management (Addendum)
Chest catheter placed per Dr. Genevive Bi. Chest tube to lower continuous pressure. Shelbie Ammons RN MSN CCM Care Management   [phone]

## 2015-12-18 NOTE — Anesthesia Preprocedure Evaluation (Signed)
Anesthesia Evaluation  Patient identified by MRN, date of birth, ID band Patient awake    Reviewed: Allergy & Precautions, NPO status , Patient's Chart, lab work & pertinent test results  History of Anesthesia Complications Negative for: history of anesthetic complications  Airway Mallampati: II       Dental  (+) Upper Dentures, Lower Dentures, Missing, Poor Dentition   Pulmonary shortness of breath and at rest, COPD, Current Smoker,           Cardiovascular + Peripheral Vascular Disease       Neuro/Psych Anxiety    GI/Hepatic negative GI ROS, Neg liver ROS,   Endo/Other  negative endocrine ROS  Renal/GU negative Renal ROS     Musculoskeletal   Abdominal   Peds  Hematology  (+) anemia ,   Anesthesia Other Findings   Reproductive/Obstetrics                             Anesthesia Physical Anesthesia Plan  ASA: III  Anesthesia Plan: General   Post-op Pain Management:    Induction: Intravenous  Airway Management Planned: LMA  Additional Equipment:   Intra-op Plan:   Post-operative Plan:   Informed Consent: I have reviewed the patients History and Physical, chart, labs and discussed the procedure including the risks, benefits and alternatives for the proposed anesthesia with the patient or authorized representative who has indicated his/her understanding and acceptance.     Plan Discussed with:   Anesthesia Plan Comments:         Anesthesia Quick Evaluation

## 2015-12-18 NOTE — Progress Notes (Signed)
[facility] Hematology/Oncology Progress Note  Date of admission: 12/16/2015  Hospital day:  12/18/2015  Chief Complaint: [name] [name] is a 65 y.o. female with metastatic breast cancer who was admitted with shortness of breath.  History of Present Illness:  She was admitted to [facility] from 12/07/2015 - 12/10/2015 with a 3 day history of worsening shortness of breath. Chest CT angiogram revealed no evidence of pulmonary embolism, but a moderate pericardial effusion, increased pleural fluid (small right and trace left), increasing infiltrative neoplasm on the left chest wall, and increase in pleural metastasis along the left heart border.  She underwent right sided thoracentesis on 12/08/2015. 700 cc were removed.  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  Tumor was ER positive.  Echo revealed an EF of 45-50% with a small anterior pericardial effusion.  She was seen in clinic on 12/11/2015.  At that time, she noted fatigue.  She declined cycle #2 Keytruda.  She presented with increasing fatigue, shortness of breath and left upper extremity edema.  Chest CT angiogram on 12/16/2015 revealed no definite evidence of pulmonary embolus.   There was a moderate right sided pleural effusion.  She underwent PleurX catheter placement today.  Left upper extremity duplex on 12/17/2015 revealed a large occlusive DVT extending from the peripheral aspect of the left subclavian vein, through the axillary vein and into at least 1 of the brachial veins.  She is on  Eliquis.  Subjective:   Feels tired.  Pain better controlled with oxycodone.  Social History: The patient is accompanied by her sister today.  Allergies:  Allergies  Allergen Reactions  . Sulfa Antibiotics Nausea And Vomiting and Other (See Comments)    headache    Scheduled Medications: . apixaban  10 mg Oral BID   Followed by  . [START ON 12/25/2015] apixaban  5 mg Oral BID  .  feeding supplement (ENSURE ENLIVE)  237 mL Oral BID BM  . polyethylene glycol  17 g Oral Daily  . potassium chloride  10 mEq Oral Daily    Review of Systems: GENERAL:  Fatigue.  No fevers, sweats.  Ongoing weight loss. PERFORMANCE STATUS (ECOG):  2-3 HEENT:  No visual changes, runny nose, sore throat, mouth sores or tenderness. Lungs: Shortness of breath.  No cough.  No hemoptysis. Cardiac:  No chest pain, palpitations, orthopnea, or PND. GI:  No nausea, vomiting, diarrhea, constipation, melena or hematochezia. GU:  No urgency, frequency, dysuria, or hematuria. Musculoskeletal:  No back pain.  No joint pain.  No muscle tenderness. Extremities:  No pain or swelling. Skin:  Chest wall lesions. Neuro:  No headache, numbness or weakness, balance or coordination issues. Endocrine:  No diabetes, thyroid issues, hot flashes or night sweats. Psych:  No mood changes, depression or anxiety. Pain:  Chest wall pain secondary to breast cancer. Review of systems:  All other systems reviewed and found to be negative.  Physical Exam: Blood pressure 110/72, pulse (!) 110, temperature 98.3 F (36.8 C), temperature source Oral, resp. rate 18, height 5' 7"  (1.702 m), weight 156 lb (70.8 kg), SpO2 96 %.  GENERAL: Chronically fatigued appearing woman sitting up in bed on the medical unit in no acute distress. MENTAL STATUS: Alert and oriented to person, place and time. HEAD: Normocephalic, atraumatic, face symmetric, no Cushingoid features. EYES: Brown eyes. Pupils equal round and reactive to light and accomodation. No conjunctivitis or scleral icterus. ENT: Oropharynx clear without lesion. Tongue normal. Mucous membranes moist.  NECK: Left  neck cyst (stable). BREAST: Dressing covering left anterior chest wall with bloody drainage on dressing.  Coalesced deep necrotic deep open lesions (foul smelling). Extensive cutaneous disease beyond open areas.  Multiple dark nodules on upper abdominal  wall. SKIN: Cutaneous breast cancer.  EXTREMITIES: Marked left upper extremity edema.  No skin discoloration or tenderness. No palpable cords. NEUROLOGICAL: Unremarkable.  PSYCH: Appropriate.   Results for orders placed or performed during the hospital encounter of 12/16/15 (from the past 48 hour(s))  CBC     Status: Abnormal   Collection Time: 12/16/15  5:10 PM  Result Value Ref Range   WBC 8.6 3.6 - 11.0 K/uL   RBC 3.89 3.80 - 5.20 MIL/uL   Hemoglobin 11.9 (L) 12.0 - 16.0 g/dL   HCT 34.5 (L) 35.0 - 47.0 %   MCV 88.7 80.0 - 100.0 fL   MCH 30.5 26.0 - 34.0 pg   MCHC 34.4 32.0 - 36.0 g/dL   RDW 17.8 (H) 11.5 - 14.5 %   Platelets 311 150 - 440 K/uL  Comprehensive metabolic panel     Status: Abnormal   Collection Time: 12/16/15  5:10 PM  Result Value Ref Range   Sodium 132 (L) 135 - 145 mmol/L   Potassium 3.7 3.5 - 5.1 mmol/L   Chloride 102 101 - 111 mmol/L   CO2 26 22 - 32 mmol/L   Glucose, Bld 92 65 - 99 mg/dL   BUN 14 6 - 20 mg/dL   Creatinine, Ser 0.64 0.44 - 1.00 mg/dL   Calcium 9.1 8.9 - 10.3 mg/dL   Total Protein 7.0 6.5 - 8.1 g/dL   Albumin 3.3 (L) 3.5 - 5.0 g/dL   AST 27 15 - 41 U/L   ALT 12 (L) 14 - 54 U/L   Alkaline Phosphatase 73 38 - 126 U/L   Total Bilirubin 0.2 (L) 0.3 - 1.2 mg/dL   GFR calc non Af Amer >60 >60 mL/min   GFR calc Af Amer >60 >60 mL/min    Comment: (NOTE) The eGFR has been calculated using the CKD EPI equation. This calculation has not been validated in all clinical situations. eGFR's persistently <60 mL/min signify possible Chronic Kidney Disease.    Anion gap 4 (L) 5 - 15  Troponin I     Status: None   Collection Time: 12/16/15  5:10 PM  Result Value Ref Range   Troponin I <0.03 <0.03 ng/mL  Urinalysis complete, with microscopic ([facility] only)     Status: Abnormal   Collection Time: 12/17/15  3:13 AM  Result Value Ref Range   Color, Urine YELLOW (A) YELLOW   APPearance HAZY (A) CLEAR   Glucose, UA NEGATIVE NEGATIVE mg/dL    Bilirubin Urine NEGATIVE NEGATIVE   Ketones, ur NEGATIVE NEGATIVE mg/dL   Specific Gravity, Urine 1.033 (H) 1.005 - 1.030   Hgb urine dipstick NEGATIVE NEGATIVE   pH 6.0 5.0 - 8.0   Protein, ur NEGATIVE NEGATIVE mg/dL   Nitrite NEGATIVE NEGATIVE   Leukocytes, UA NEGATIVE NEGATIVE   RBC / HPF 0-5 0 - 5 RBC/hpf   WBC, UA 0-5 0 - 5 WBC/hpf   Bacteria, UA RARE (A) NONE SEEN   Squamous Epithelial / LPF 6-30 (A) NONE SEEN   Mucous PRESENT   Basic metabolic panel     Status: Abnormal   Collection Time: 12/17/15  3:50 AM  Result Value Ref Range   Sodium 134 (L) 135 - 145 mmol/L   Potassium 3.5 3.5 - 5.1 mmol/L  Chloride 102 101 - 111 mmol/L   CO2 24 22 - 32 mmol/L   Glucose, Bld 93 65 - 99 mg/dL   BUN 10 6 - 20 mg/dL   Creatinine, Ser 0.71 0.44 - 1.00 mg/dL   Calcium 8.6 (L) 8.9 - 10.3 mg/dL   GFR calc non Af Amer >60 >60 mL/min   GFR calc Af Amer >60 >60 mL/min    Comment: (NOTE) The eGFR has been calculated using the CKD EPI equation. This calculation has not been validated in all clinical situations. eGFR's persistently <60 mL/min signify possible Chronic Kidney Disease.    Anion gap 8 5 - 15  CBC     Status: Abnormal   Collection Time: 12/17/15  3:50 AM  Result Value Ref Range   WBC 6.8 3.6 - 11.0 K/uL   RBC 3.60 (L) 3.80 - 5.20 MIL/uL   Hemoglobin 10.9 (L) 12.0 - 16.0 g/dL   HCT 32.0 (L) 35.0 - 47.0 %   MCV 89.0 80.0 - 100.0 fL   MCH 30.3 26.0 - 34.0 pg   MCHC 34.0 32.0 - 36.0 g/dL   RDW 18.1 (H) 11.5 - 14.5 %   Platelets 280 150 - 440 K/uL  Magnesium     Status: None   Collection Time: 12/17/15  3:50 AM  Result Value Ref Range   Magnesium 2.0 1.7 - 2.4 mg/dL  Phosphorus     Status: None   Collection Time: 12/17/15  3:50 AM  Result Value Ref Range   Phosphorus 4.0 2.5 - 4.6 mg/dL  Troponin I     Status: None   Collection Time: 12/17/15  3:50 AM  Result Value Ref Range   Troponin I <0.03 <0.03 ng/mL  Troponin I     Status: None   Collection Time: 12/17/15 10:49  AM  Result Value Ref Range   Troponin I <0.03 <0.03 ng/mL  Troponin I     Status: Abnormal   Collection Time: 12/17/15  2:01 PM  Result Value Ref Range   Troponin I 0.09 (HH) <0.03 ng/mL    Comment: CRITICAL RESULT CALLED TO, READ BACK BY AND VERIFIED WITH DONNA HESLEP 12/17/15 1441 SGD   APTT     Status: None   Collection Time: 12/17/15  3:50 PM  Result Value Ref Range   aPTT 26 24 - 36 seconds  Protime-INR     Status: None   Collection Time: 12/17/15  3:50 PM  Result Value Ref Range   Prothrombin Time 15.2 11.4 - 15.2 seconds   INR 1.19   Troponin I     Status: None   Collection Time: 12/17/15  3:50 PM  Result Value Ref Range   Troponin I <0.03 <0.03 ng/mL  Troponin I     Status: None   Collection Time: 12/17/15  7:46 PM  Result Value Ref Range   Troponin I <0.03 <0.03 ng/mL  Troponin I     Status: None   Collection Time: 12/17/15 11:44 PM  Result Value Ref Range   Troponin I <0.03 <0.03 ng/mL   Dg Chest 2 View  Result Date: 12/16/2015 CLINICAL DATA:  65 year old female with shortness upper EXAM: CHEST  2 VIEW COMPARISON:  Chest radiograph dated are 12/15/2015 and CT dated 12/07/2015 FINDINGS: There are small bilateral pleural effusions, grossly similar to prior study. No stable appearing masslike opacity in the left mid lung field corresponds to the fluid seen in the major fissure on the prior CT. There is mild diffuse of the  hazy density throughout the left lungs, likely related to atelectatic changes. Superimposed pneumonia is less likely but not excluded. Clinical correlation is recommended. There is no pneumothorax. Stable cardiac silhouette. Right pectoral Port-A-Cath with tip in stable positioning. Multiple surgical clips noted over the right axilla. No acute osseous pathology. IMPRESSION: No significant change in the in small bilateral pleural effusions and fluid in the left major fissure. Overall stable appearance of the lungs. Electronically Signed   By: Anner Crete  M.D.   On: 12/16/2015 19:57   Ct Angio Chest Pe W And/or Wo Contrast  Result Date: 12/16/2015 CLINICAL DATA:  Shortness of breath. Current history of breast cancer. EXAM: CT ANGIOGRAPHY CHEST WITH CONTRAST TECHNIQUE: Multidetector CT imaging of the chest was performed using the standard protocol during bolus administration of intravenous contrast. Multiplanar CT image reconstructions and MIPs were obtained to evaluate the vascular anatomy. CONTRAST:  75 mL of Isovue 370 intravenously. COMPARISON:  CT scan of December 07, 2015. FINDINGS: No pneumothorax is noted. Moderate right pleural effusion is noted with minimal right basilar subsegmental atelectasis. Continued presence of loculated effusion in left major fissure. Increased opacity is noted in lingular segment of left upper lobe consistent with atelectasis or possibly inflammation. There is no definite evidence of pulmonary embolus. Atherosclerosis of thoracic aorta is noted without aneurysm or dissection. Right internal jugular Port-A-Cath is noted. Multiple nodules are noted in the overlying soft tissues of the left chest consistent with metastatic disease. There is continued presence of fluid within the mediastinum which is stable. Visualized portion of upper abdomen is unremarkable. No significant osseous abnormality is noted. Review of the MIP images confirms the above findings. IMPRESSION: No definite evidence of pulmonary embolus. Moderate right pleural effusion is noted with minimal right basilar subsegmental atelectasis. Stable loculated effusions seen in left major fissure. Increased opacity is noted in lingular segment of left upper lobe concerning for atelectasis or possibly inflammation. Aortic atherosclerosis. Multiple nodules are noted in the overlying soft tissues the left chest concerning for metastatic disease. Electronically Signed   By: Marijo Conception, M.D.   On: 12/16/2015 21:04   US Venous Img Upper Uni Left  Result Date:  12/17/2015 CLINICAL DATA:  Left upper extremity edema for the past 3 days. History of breast cancer. History of smoking. Evaluate for DVT. EXAM: LEFT UPPER EXTREMITY VENOUS DOPPLER ULTRASOUND TECHNIQUE: Gray-scale sonography with graded compression, as well as color Doppler and duplex ultrasound were performed to evaluate the upper extremity deep venous system from the level of the subclavian vein and including the jugular, axillary, basilic, radial, ulnar and upper cephalic vein. Spectral Doppler was utilized to evaluate flow at rest and with distal augmentation maneuvers. COMPARISON:  None. FINDINGS: Contralateral Subclavian Vein: Respiratory phasicity is normal and symmetric with the symptomatic side. No evidence of thrombus. Normal compressibility. Internal Jugular Vein: No evidence of thrombus. Normal compressibility, respiratory phasicity and response to augmentation. Subclavian Vein: There is eccentric mixed echogenic nonocclusive thrombus within the peripheral aspect of the left subclavian vein (representative images 7 and 8. Axillary Vein: Hypoechoic expansile occlusive DVT is noted within the left axillary vein (images 10 and 11. Cephalic Vein: No evidence of thrombus. Normal compressibility, respiratory phasicity and response to augmentation. Basilic Vein: No evidence of thrombus. Normal compressibility, respiratory phasicity and response to augmentation. Brachial Veins: There is hypoechoic occlusive thrombus within at least 1 of the 3 brachial veins extending from the proximal to the distal upper arm (representative images 22-24). Radial Veins: No evidence  of thrombus. Normal compressibility, respiratory phasicity and response to augmentation. Ulnar Veins: Not well imaged. Other Findings: There is a approximately 2.1 x 0.9 x 1.8 cm fairly well-defined nodule within the left side of the neck which appears to contain a punctate echogenic calcification (representative images 34 through 39). Note is made of  an additional benign appearing non pathologically enlarged left supraclavicular lymph node which is not enlarged by size criteria (measuring 0.6 cm in diameter) and maintains a benign fatty hilum (image 43). IMPRESSION: 1. Examination is positive for largely occlusive DVT extending from the peripheral aspect of the left subclavian vein, through the axillary vein and into at least 1 of the brachial veins. 2. Note made of an approximately 2.1 cm nodule within the left side of the neck, indeterminate on this examination though given history of breast cancer, may represent a pathologically enlarged lymph node. Clinical correlation is advised. These results will be called to the ordering clinician or representative by the Radiologist Assistant, and communication documented in the PACS or zVision Dashboard. Electronically Signed   By: Sandi Mariscal M.D.   On: 12/17/2015 11:16   Dg Chest Port 1 View  Result Date: 12/18/2015 CLINICAL DATA:  Postop.  Breast carcinoma EXAM: PORTABLE CHEST 1 VIEW COMPARISON:  Chest radiograph 12/16/2015 FINDINGS: RIGHT-sided port. Stable cardiac silhouette with ectatic aorta. There is increased LEFT lower lobe density effusion. Chest tube in the RIGHT hemi thorax. No pneumothorax present. IMPRESSION: 1. Increasing LEFT lower lobe density representing atelectasis, aspiration or infiltrate. 2. Loculated LEFT effusion unchanged. 3. No pneumothorax. Electronically Signed   By: Suzy Bouchard M.D.   On: 12/18/2015 12:00    Assessment:  [name] [name] is a 65 y.o. female with metastatic breast cancer s/p multiple lines of chemotherapy.  She is s/p cycle #1 Keytruda (11/20/2015).  She has locally advanced disease with erosion into the chest wall, pleural and pericardial disease.    She underwent right sided thoracentesis on 12/08/2015. 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  Tumor was ER positive.  Echo revealed an EF of 45-50% with a small  anterior pericardial effusion.  She represented with shortness of breath and left upper extremity edema.  She underwent right sided PleurX catheter placement today for a moderate right sided effusion.  She is on Eliquis for left upper extremity DVT.  Plan: 1.  Oncology:  End stage metastatic breast cancer.  Discuss limited treatment options.  Patient has become progressively fatigued with increasing complications.  Patient aware that treatment with Beryle Flock is palliative with unclear benefit. She feels too fatigued to continue therapy.  Discuss supportive care focusing on quality of life.  Patient interested in Hospice referral.  2.  Hematology:  Discuss need for life long anticoagulation.  Would prefer that patient be anticoagulated with Lovenox rather Eliquis.  No long term data with Eliquis treating patients with malignancy.  If patient has complication with bleeding or requires intervention unable to reverse Eliquis.  3.  Pian and Toxicology: Patient notes improved relief with oxycodone as compared to hydrocodone.  Order written.  4.  Disposition:  Palliative care consult ordered.  Plan for admission to Hospice.  Code status is DNR.   Lequita Asal, MD  12/18/2015, 2:47 PM

## 2015-12-18 NOTE — Progress Notes (Signed)
PT Cancellation Note  Patient Details Name: [name] [name] MRN: [id number] DOB: [birth year]   Cancelled Treatment:    Reason Eval/Treat Not Completed: Medical issues which prohibited therapy (Patient returned from procedure this AM.  Upon further chart review, noted that procedure was completed under general anesthesia.  Per policy, patient will require new orders to initiate PT services after use of general anesthesia.  Please re-consult post-procedure as appropriate.    Of note, patient also noted with largely occlusive DVT to L UE (noted 9/6) with anticoagulation scheduled to start 9/7 after procedure.  Per policy, required to be on anticoagulation x6 hours prior to start of PT after newly-diagnosed DVT.  Will continue efforts as new order received and patient medically appropriate next date.)   Holleigh Crihfield H. Owens Shark, PT, DPT, NCS 12/18/15, 2:44 PM [phone]

## 2015-12-18 NOTE — Anesthesia Procedure Notes (Signed)
Procedure Name: LMA Insertion Date/Time: 12/18/2015 10:15 AM Performed by: Allean Found Pre-anesthesia Checklist: Patient identified, Emergency Drugs available, Suction available, Patient being monitored and Timeout performed Patient Re-evaluated:Patient Re-evaluated prior to inductionOxygen Delivery Method: Circle system utilized Preoxygenation: Pre-oxygenation with 100% oxygen Intubation Type: IV induction Ventilation: Mask ventilation without difficulty LMA: LMA inserted and LMA with gastric port inserted LMA Size: 4.0 Number of attempts: 1 Placement Confirmation: positive ETCO2 Tube secured with: Tape Dental Injury: Teeth and Oropharynx as per pre-operative assessment

## 2015-12-18 NOTE — Progress Notes (Signed)
New referral for [facility] services at home following discharge received from [facility]. Ms. Waltermire is a 65 year old woman with recurrent breast cancer s/p mastectomy and several cycles of chemo and radiation. She was admitted to [facility] on 9/5 for evaluation of shortness of breath, she is known to have a malignant pleural effusion which was drained by thoracentesis at last admission. She has undergone placement today of a right sided indwelling pleurex catheter. She also has a fungating wound to her left chest, with significant left arm swelling noted. Per oncology note review "patient feels too fatigued to continue therapy". She is now open to hospice services at discharge. Writer met in the room with Ms. West and her sister Mrs. Turner to initiate education regarding hospice services, philosophy and team approach to care with understanding voiced. No current DME needs at this time. Patient is hoping to discharge home tomorrow. She will need pleurex draining supplies at discharge. Will continue to follow through final disposition. Thank you. Flo Shanks RN, BSN, [place] [facility] [place], [facility] [phone] c

## 2015-12-18 NOTE — Progress Notes (Signed)
Dr.Corcoran Called me, as he may have high risk for pericardia effusion- she will like to keep him on lovenox on discharge instead of Eliquis.  I spoke to pharmacist to switch to lovenox.

## 2015-12-19 ENCOUNTER — Other Ambulatory Visit: Payer: Self-pay | Admitting: *Deleted

## 2015-12-19 ENCOUNTER — Telehealth: Payer: Self-pay | Admitting: *Deleted

## 2015-12-19 DIAGNOSIS — G893 Neoplasm related pain (acute) (chronic): Secondary | ICD-10-CM

## 2015-12-19 LAB — CBC
HEMATOCRIT: 34.4 % — AB (ref 35.0–47.0)
HEMOGLOBIN: 11.9 g/dL — AB (ref 12.0–16.0)
MCH: 30.8 pg (ref 26.0–34.0)
MCHC: 34.8 g/dL (ref 32.0–36.0)
MCV: 88.7 fL (ref 80.0–100.0)
Platelets: 286 10*3/uL (ref 150–440)
RBC: 3.87 MIL/uL (ref 3.80–5.20)
RDW: 18.3 % — AB (ref 11.5–14.5)
WBC: 9.7 10*3/uL (ref 3.6–11.0)

## 2015-12-19 MED ORDER — OXYCODONE-ACETAMINOPHEN 5-325 MG PO TABS: 1.0000 | ORAL_TABLET | Freq: Three times a day (TID) | ORAL | 0 refills | Status: AC | PRN

## 2015-12-19 MED ORDER — OXYCODONE-ACETAMINOPHEN 5-325 MG PO TABS: 1.0000 | ORAL_TABLET | Freq: Three times a day (TID) | ORAL | 0 refills | Status: DC | PRN

## 2015-12-19 MED ORDER — ENOXAPARIN SODIUM 80 MG/0.8ML ~~LOC~~ SOLN: 70.0000 mg | Freq: Two times a day (BID) | SUBCUTANEOUS | 0 refills | Status: DC

## 2015-12-19 NOTE — Progress Notes (Signed)
Pt refused to have port reaccessed.  After much discussion with 2 different nurses, she still refuses.  She understands that blood draws will have to be done daily as ordered.

## 2015-12-19 NOTE — Telephone Encounter (Signed)
Needs orders specific to her pleurex catheter care

## 2015-12-19 NOTE — Telephone Encounter (Signed)
Lattie Haw informed per Dr Mike Gip to call Dr Genevive Bi

## 2015-12-19 NOTE — Progress Notes (Signed)
Patient Saturations on Room Air while Ambulating with PT = 98%

## 2015-12-19 NOTE — Care Management (Signed)
Ordered 6 pleurex kits from central supply for delivery to room.

## 2015-12-19 NOTE — Progress Notes (Signed)
Follow up visit made to new referral for [facility] services following discharge. Patient seen sitting up in bed, alert and oriented. Staff RN's present to change Right pleurex dressing and disconnect from suction. Drainage canisters to be sent home with patient (2)  hospice to provide after admission. Draining instructions of every 3 days received. Updated information faxed to referral. Patient discharged home via car, will contact hospice when she arrives home. Thank you.  Flo Shanks RN, BSN, [facility] [place], hospital Liaison [phone] c

## 2015-12-19 NOTE — Progress Notes (Signed)
Pt being discharged home with hospice, discharge instructions and prescription reviewed with pt and sister, pt teaching done on lovenox administration, pt demonstrates and states understanding, pt education provided on plurex cath/drainage, supplies given to pt, pt states understanding of info provided, states that she will watch DVD at home, hospice nurses to assist pt with plurex cath at home, pt with no questions at discharge, no complaints, no distress or discomfort noted

## 2015-12-19 NOTE — Discharge Instructions (Signed)
Please seek medical attention for any high fevers, chest pain, shortness of breath, change in behavior, persistent vomiting, bloody stool or any other new or concerning symptoms.  Hospice Hospice is a service that is designed to provide people who are terminally ill and their families with medical, spiritual, and psychological support. Its aim is to improve your quality of life by keeping you as alert and comfortable as possible. Hospice is performed by a team of health care professionals and volunteers who:  Help keep you comfortable. Hospice can be provided in your home or in a homelike setting. The hospice staff works with your family and friends to help meet your needs. You will enjoy the support of loved ones by receiving much of your basic care from family and friends.  Provide pain relief and manage your symptoms. The staff supply all necessary medicines and equipment.  Provide companionship when you are alone.  Allow you and your family to rest. They may do light housekeeping, prepare meals, and run errands.  Provide counseling. They will make sure your emotional, spiritual, and social needs and those of your family are being met.  Provide spiritual care. Spiritual care is individualized to meet your needs and your family's needs. It may involve helping you look at what death means to you, say goodbye, or perform a specific religious ceremony or ritual. Hospice teams often include:  A nurse.  A doctor.  Social workers.  Religious leaders (such as a Clinical biochemist).  Trained volunteers. WHEN SHOULD HOSPICE CARE BEGIN? Most people who use hospice are believed to have fewer than 6 months to live. Your family and health care providers can help you decide when hospice services should begin. If your condition improves, you may discontinue the program. WHAT [place]? Most hospice programs are run by nonprofit, independent organizations. Some are affiliated  with hospitals, nursing homes, or home health care agencies. Hospice programs can take place in the home or at a hospice center, hospital, or skilled nursing facility. When choosing a hospice program, ask the following questions:  What services are available to me?  What services are offered to my loved ones?  How involved are my loved ones?  How involved is my health care provider?  Who makes up the hospice care team? How are they trained or screened?  How will my pain and symptoms be managed?  If my circumstances change, can the services be provided in a different setting, such as my home or in the hospital?  Is the program reviewed and licensed by the state or certified in some other way? WHERE CAN I LEARN MORE ABOUT HOSPICE? You can learn about existing hospice programs in your area from your health care providers. You can also read more about hospice online. The websites of the following organizations contain helpful information:  The [facility] and Palliative Care Organization [facility]).  The Hospice Association of America ([place]).  The [place].  The American Cancer Society (ACS).  Hospice Net.   This information is not intended to replace advice given to you by your health care provider. Make sure you discuss any questions you have with your health care provider.   Document Released: 07/16/2003 Document Revised: 04/03/2013 Document Reviewed: 02/06/2013 Elsevier Interactive Patient Education [employer].

## 2015-12-19 NOTE — Care Management Important Message (Signed)
Important Message  Patient Details  Name: [name] [name] MRN: [id number] Date of Birth: [birth year]   Medicare Important Message Given:  Yes    Jolly Mango, RN 12/19/2015, 9:23 AM

## 2015-12-19 NOTE — Progress Notes (Signed)
[name] [name] Inpatient Post-Op Note  Patient ID: [name] [name], female   DOB: 12-31-1950, 64 y.o.   MRN: [id number]  HISTORY: Overall the chest tube is draining about 400 cc since insertion. Patient states that she feels about the same pre-and postoperatively. She is on oxygen therapy now was not preoperatively.   Vitals:   12/18/15 2014 12/19/15 0416  BP: 106/66 109/65  Pulse: (!) 117 (!) 107  Resp: 18   Temp: 98.6 F (37 C) 98.3 F (36.8 C)     EXAM:    Resp: Lungs are clear On the right but diminished on the left.  No respiratory distress, normal effort. Heart:  Regular without murmurs Neurological: Alert and oriented to person, place, and time. Coordination normal.  Skin: Skin is warm and dry. No rash noted. No diaphoretic. No erythema. No pallor.  Psychiatric: Normal mood and affect. Normal behavior. Judgment and thought content normal.    ASSESSMENT: There is no air leak from the chest tube and it has only drained a minimal amount. I discussed her care today with Dr. Koren Bound who has recommended that the patient be treated with hospice services at home. I am in agreement with that.   PLAN:   I have asked the nurses to show the patient and her family the video for wound care regarding her Pleurx catheter. I gave the patient my business card and told her to contact my office for suture removal in approximately 2 weeks. Of also gave the patient a copy of the relevant literature regarding her Pleurx catheter including a DVD for her to use at home should she have any questions.    Nestor Lewandowsky, MD

## 2015-12-19 NOTE — Evaluation (Signed)
Physical Therapy Evaluation Patient Details Name: [name] [name] MRN: [id number] DOB: Mar 01, 1951 Today's Date: 12/19/2015   History of Present Illness  Pt admitted for continued symptoms of SOB. Pt now diagnoses with pericardial effusion. Underwent R side indwelling pleurex catheter on 12/19/15. New orders received. Also, pt with positive DVT in L subclavian; with therapeudic dose beginning on 12/18/15 at 2027. Per RN, pt appropriate for PT eval at this time.  Pt with history of breast cancer and has decided to go home with hospice.   Clinical Impression  Pt is a pleasant 65 year old female who was admitted for SOB symptoms and pericardial effusion. Pt performs bed mobility/transfers with independence and ambulation with cga. No AD used and then further ambulation performed with RW. Pt with improved balance while using RW, recommend continued use in home environment. All mobility performed on room air with sats at 98%. HR elevated to 141 during ambulation. Pt demonstrates deficits with strength/endurance. Would benefit from skilled PT to address above deficits and promote optimal return to PLOF.      Follow Up Recommendations  (home with hospice )    Equipment Recommendations  Rolling walker with 5" wheels    Recommendations for Other Services       Precautions / Restrictions Precautions Precautions: Fall Precaution Comments: Metastatic disease Restrictions Weight Bearing Restrictions: No      Mobility  Bed Mobility Overal bed mobility: Independent             General bed mobility comments: safe technique performed  Transfers Overall transfer level: Independent Equipment used: None             General transfer comment: safe technique with upright posture noted. No AD required  Ambulation/Gait Ambulation/Gait assistance: Min guard Ambulation Distance (Feet): 130 Feet Assistive device: None Gait Pattern/deviations: Step-to pattern;Shuffle;Narrow base of support      General Gait Details: ambulated using no AD with unsteadiness noted. Pt with small BOS and occasionally reaching out for hand railing in hall. Pt with no overt LOB. Very slow gait speed. No SOB symptoms noted as all ambulation performed on room air.  Stairs            Wheelchair Mobility    Modified Rankin (Stroke Patients Only)       Balance Overall balance assessment: Modified Independent                                           Pertinent Vitals/Pain Pain Assessment: No/denies pain    Home Living Family/patient expects to be discharged to:: Private residence Living Arrangements:  (lives with a tenant who is there at night) Available Help at Discharge: Friend(s);Available PRN/intermittently Type of Home: House Home Access: Level entry     Home Layout: One level Home Equipment: None      Prior Function Level of Independence: Independent         Comments: Pt reports she has been independent with all mobility     Hand Dominance        Extremity/Trunk Assessment   Upper Extremity Assessment: Overall WFL for tasks assessed           Lower Extremity Assessment: Generalized weakness (B LE grossly 4/5)         Communication   Communication: No difficulties  Cognition Arousal/Alertness: Awake/alert Behavior During Therapy: WFL for tasks assessed/performed Overall Cognitive Status: Within  Functional Limits for tasks assessed                      General Comments      Exercises Other Exercises Other Exercises: Further ambulation performed with RW for additional 50' and cga. Improved gait speed and balance. Pt able to demonstrate upright posture and normal BOS.       Assessment/Plan    PT Assessment Patient needs continued PT services  PT Diagnosis Difficulty walking;Generalized weakness   PT Problem List Decreased strength;Decreased balance;Decreased mobility  PT Treatment Interventions DME instruction;Gait  training;Therapeutic exercise   PT Goals (Current goals can be found in the Care Plan section) Acute Rehab PT Goals Patient Stated Goal: to go home PT Goal Formulation: With patient Time For Goal Achievement: 01/02/16 Potential to Achieve Goals: Good    Frequency Min 2X/week   Barriers to discharge        Co-evaluation               End of Session   Activity Tolerance: Patient tolerated treatment well Patient left: in bed;with call bell/phone within reach;with family/visitor present Nurse Communication: Mobility status         Time: [id number] PT Time Calculation (min) (ACUTE ONLY): 14 min   Charges:   PT Evaluation $PT Eval Low Complexity: 1 Procedure PT Treatments $Gait Training: 8-22 mins   PT G Codes:        Jemeka Wagler December 25, 2015, 11:38 AM  Greggory Stallion, PT, DPT [phone]

## 2015-12-20 NOTE — Discharge Summary (Signed)
[facility] [place] NAME: [name] [name]    MR#:  [id number]  DATE OF BIRTH:  22-Oct-1950  DATE OF ADMISSION:  12/16/2015   ADMITTING PHYSICIAN: Harvie Bridge, DO  DATE OF DISCHARGE: 12/19/2015 11:30 AM  PRIMARY CARE PHYSICIAN: Lequita Asal, MD   ADMISSION DIAGNOSIS:  Shortness of breath [R06.02] Tachycardia [R00.0] DISCHARGE DIAGNOSIS:  Principal Problem:   Pericardial effusion Active Problems:   Metastatic breast cancer (HCC)   Pleural effusion   Protein-calorie malnutrition, severe   Shortness of breath   DVT of axillary vein, acute left   Demand ischemia ([place])  SECONDARY DIAGNOSIS:   Past Medical History:  Diagnosis Date  . Anemia   . Anxiety   . Blood transfusion without reported diagnosis   . Emphysema of lung ([place])   . History of left breast cancer , known active 2013  . Personal history of malignant neoplasm of breast    HOSPITAL COURSE:  This is a 65 y.o.femalewith a history of known inflammatory breast cancer with metastatic disease and malignant pleural effusion on the rightnow being admitted with:  * Small/moderate recurrent pleural effusion. CT of the chest neg for PE.  - s/p PleurX cathetar by Dr Genevive Bi  * Lt UE DVT - started on lovenox injection per On[facility] for full doseanticoagulation  * h/o breast CA: - Onco c/s pending  * Hyponatremia - improving with hydration  * weakness: multifactorial  DISCHARGE CONDITIONS:  stable CONSULTS OBTAINED:  Treatment Team:  Nestor Lewandowsky, MD DRUG ALLERGIES:   Allergies  Allergen Reactions  . Sulfa Antibiotics Nausea And Vomiting and Other (See Comments)    headache   DISCHARGE MEDICATIONS:     Medication List    STOP taking these medications   CALCIUM 600 + D PO   KLOR-CON 10 10 MEQ tablet Generic drug:  potassium chloride     TAKE these medications   enoxaparin 80 MG/0.8ML injection Commonly known as:  LOVENOX Inject 0.7 mLs (70  mg total) into the skin every 12 (twelve) hours.   everolimus 10 MG tablet Commonly known as:  AFINITOR Take 10 mg by mouth daily.   exemestane 25 MG tablet Commonly known as:  AROMASIN Take 25 mg by mouth daily after breakfast.   feeding supplement (ENSURE ENLIVE) Liqd Take 237 mLs by mouth 2 (two) times daily between meals.   levofloxacin 750 MG tablet Commonly known as:  LEVAQUIN Take 1 tablet (750 mg total) by mouth daily.   LORazepam 0.5 MG tablet Commonly known as:  ATIVAN Take 0.5 mg by mouth daily as needed for anxiety.   megestrol 400 MG/10ML suspension Commonly known as:  MEGACE Take 5 mLs (200 mg total) by mouth daily.   traMADol 50 MG tablet Commonly known as:  ULTRAM 1 - 2 tablets (50 - 100 mg) every 6 hours as needed for pain What changed:  how much to take  how to take this  when to take this  reasons to take this  additional instructions      DISCHARGE INSTRUCTIONS:   DIET:  Regular diet DISCHARGE CONDITION:  Good ACTIVITY:  Activity as tolerated OXYGEN:  Home Oxygen: No.  Oxygen Delivery: room air DISCHARGE LOCATION:  home with Hospice  If you experience worsening of your admission symptoms, develop shortness of breath, life threatening emergency, suicidal or homicidal thoughts you must seek medical attention immediately by calling 911 or calling your MD immediately  if symptoms less severe.  You Must read complete instructions/literature along with all the possible adverse reactions/side effects for all the Medicines you take and that have been prescribed to you. Take any new Medicines after you have completely understood and accpet all the possible adverse reactions/side effects.   Please note  You were cared for by a hospitalist during your hospital stay. If you have any questions about your discharge medications or the care you received while you were in the hospital after you are discharged, you can call the unit and asked to speak  with the hospitalist on call if the hospitalist that took care of you is not available. Once you are discharged, your primary care physician will handle any further medical issues. Please note that NO REFILLS for any discharge medications will be authorized once you are discharged, as it is imperative that you return to your primary care physician (or establish a relationship with a primary care physician if you do not have one) for your aftercare needs so that they can reassess your need for medications and monitor your lab values.    On the day of Discharge:  VITAL SIGNS:  Blood pressure 109/65, pulse (!) 107, temperature 98.3 F (36.8 C), temperature source Oral, resp. rate 18, height 5\' 7"  (1.702 m), weight 70.8 kg (156 lb), SpO2 100 %. PHYSICAL EXAMINATION:  GENERAL:  65 y.o.-year-old patient lying in the bed with no acute distress.  EYES: Pupils equal, round, reactive to light and accommodation. No scleral icterus. Extraocular muscles intact.  HEENT: Head atraumatic, normocephalic. Oropharynx and nasopharynx clear.  NECK:  Supple, no jugular venous distention. No thyroid enlargement, no tenderness.  LUNGS: Normal breath sounds bilaterally, no wheezing, rales,rhonchi or crepitation. No use of accessory muscles of respiration.  CARDIOVASCULAR: S1, S2 normal. No murmurs, rubs, or gallops.  ABDOMEN: Soft, non-tender, non-distended. Bowel sounds present. No organomegaly or mass.  EXTREMITIES: No pedal edema, cyanosis, or clubbing.  NEUROLOGIC: Cranial nerves II through XII are intact. Muscle strength 5/5 in all extremities. Sensation intact. Gait not checked.  PSYCHIATRIC: The patient is alert and oriented x 3.  SKIN: No obvious rash, lesion, or ulcer.  DATA REVIEW:   CBC  Recent Labs Lab 12/19/15 0801  WBC 9.7  HGB 11.9*  HCT 34.4*  PLT 286    Chemistries   Recent Labs Lab 12/16/15 1710 12/17/15 0350  NA 132* 134*  K 3.7 3.5  CL 102 102  CO2 26 24  GLUCOSE 92 93  BUN 14  10  CREATININE 0.64 0.71  CALCIUM 9.1 8.6*  MG  --  2.0  AST 27  --   ALT 12*  --   ALKPHOS 73  --   BILITOT 0.2*  --     Follow-up Information    Primary Care Follow up in 2 day(s).        Lequita Asal, MD. Schedule an appointment as soon as possible for a visit in 1 week(s).   Specialty:  Hematology and Oncology Contact information: [address] [place] [id number] [phone]            Management plans discussed with the patient, family and they are in agreement.  CODE STATUS: DNR, comfort care  TOTAL TIME TAKING CARE OF THIS PATIENT: 45 minutes.    [facility], Tashiba Timoney M.D on 12/20/2015 at 5:03 PM  Between 7am to 6pm - Pager - [phone]  After 6pm go to [email or web address] - [facility]  Office  [phone]  CC: Primary care physician; Lequita Asal, MD   Note: This dictation was prepared with Dragon dictation along with smaller phrase technology. Any transcriptional errors that result from this process are unintentional.

## 2015-12-22 ENCOUNTER — Encounter: Payer: Self-pay | Admitting: Cardiothoracic Surgery

## 2015-12-23 ENCOUNTER — Telehealth: Payer: Self-pay | Admitting: Cardiothoracic Surgery

## 2015-12-23 DIAGNOSIS — R918 Other nonspecific abnormal finding of lung field: Secondary | ICD-10-CM

## 2015-12-23 NOTE — Telephone Encounter (Signed)
Nurse Lattie Haw with [facility] would like to speak with the nurse of Dr Genevive Bi regarding this patient. Please call Lattie Haw at [phone]. Thanks.

## 2015-12-23 NOTE — Telephone Encounter (Signed)
Spoke to patient's nurse Lattie Haw from [facility] and she stated that she had tried to drain patient's pleurx catheter and has not been successful. She wanted to know if Dr. Genevive Bi will be able to see patient for apparatus malfunction. I told her that Dr. Genevive Bi will be able to see her on Friday 12/26/2015. Lattie Haw wanted patient to be seen before. I told her that I would contact Dr. Genevive Bi and I would ask. I told her that I would contact patient in case we could see her before Friday. Lattie Haw understood.  I called Dr. Genevive Bi and he stated that he would be able to see the patient tomorrow morning at 10:00 AM after she has her Chest X-Ray done.  Patient understood.

## 2015-12-24 ENCOUNTER — Ambulatory Visit
Admission: RE | Admit: 2015-12-24 | Discharge: 2015-12-24 | Disposition: A | Discharge status: Home / Self Care | Source: Ambulatory Visit | Attending: Cardiothoracic Surgery | Admitting: Cardiothoracic Surgery

## 2015-12-24 ENCOUNTER — Ambulatory Visit (INDEPENDENT_AMBULATORY_CARE_PROVIDER_SITE_OTHER): Payer: Medicare Other | Admitting: Cardiothoracic Surgery

## 2015-12-24 ENCOUNTER — Encounter: Payer: Self-pay | Admitting: Cardiothoracic Surgery

## 2015-12-24 VITALS — BP 121/80 | HR 119 | Temp 98.2°F | Resp 22 | Ht 67.0 in | Wt 153.4 lb

## 2015-12-24 DIAGNOSIS — J910 Malignant pleural effusion: Secondary | ICD-10-CM | POA: Diagnosis not present

## 2015-12-24 DIAGNOSIS — R918 Other nonspecific abnormal finding of lung field: Secondary | ICD-10-CM

## 2015-12-24 DIAGNOSIS — I700 Atherosclerosis of aorta: Secondary | ICD-10-CM | POA: Insufficient documentation

## 2015-12-24 DIAGNOSIS — J90 Pleural effusion, not elsewhere classified: Secondary | ICD-10-CM | POA: Insufficient documentation

## 2015-12-24 NOTE — Progress Notes (Signed)
  Patient ID: [name] [name], female   DOB: [birth year], 65 y.o.   MRN: [id number]  HISTORY: Still feels short of breath.  Hospice nurse tried to drain the PleurX catheter but only a few cc's were obtained.  Still with significant LUE swelling   Vitals:   12/24/15 0942  BP: 121/80  Pulse: (!) 119  Resp: (!) 22  Temp: 98.2 F (36.8 C)     EXAM:    Resp: Lungs are clear on the right but decreased on the left.  No respiratory distress, normal effort. Heart:  Regular without murmurs but slightly tachycardic Abd:  Abdomen is soft, non distended and non tender. No masses are palpable.  There is no rebound and no guarding.  Neurological: Alert and oriented to person, place, and time. Coordination normal.  Skin: Skin is warm and dry. No rash noted. No diaphoretic. No erythema. No pallor.  Psychiatric: Normal mood and affect. Normal behavior. Judgment and thought content normal.    ASSESSMENT: Independent review of CXRAy shows minimal right sided pleural effusion   PLAN:   To followup with [name].  Instructed not to attempt drainage of catheter for 7 days.    Nestor Lewandowsky, MD

## 2015-12-24 NOTE — Patient Instructions (Signed)
Please follow-up with your Pulmonary doctor and your hospice nurse in regards to your Heart rate. It was 119-130 today in the office. Drinking more water may help bring this down.

## 2015-12-25 ENCOUNTER — Other Ambulatory Visit: Payer: Self-pay | Admitting: *Deleted

## 2015-12-25 MED ORDER — TRAMADOL HCL 50 MG PO TABS: ORAL_TABLET | ORAL | 0 refills | Status: DC

## 2015-12-25 NOTE — Telephone Encounter (Signed)
Does not feel her pain is bad enough to warrant using Oxycodone

## 2015-12-26 ENCOUNTER — Encounter: Payer: Self-pay | Admitting: Cardiothoracic Surgery

## 2015-12-30 ENCOUNTER — Telehealth: Payer: Self-pay | Admitting: *Deleted

## 2015-12-30 NOTE — Telephone Encounter (Signed)
[name] [name] pleurex is not draining anything and Dr Genevive Bi has checked it and said it is in place. He told [name] [name] to wait a week before draining and it only drained a few drops. Asking for new orders to drain every 1 - 2 weeks and not every 3 days.

## 2015-12-30 NOTE — Telephone Encounter (Signed)
Per Dr Mike Gip, [place] to change order to every 1 - 2 weeks. Lisa informed.

## 2016-01-01 ENCOUNTER — Encounter: Payer: Self-pay | Admitting: Radiation Oncology

## 2016-01-01 ENCOUNTER — Ambulatory Visit
Admission: RE | Admit: 2016-01-01 | Discharge: 2016-01-01 | Disposition: A | Discharge status: Home / Self Care | Payer: Medicare Other | Source: Ambulatory Visit | Attending: Radiation Oncology | Admitting: Radiation Oncology

## 2016-01-01 ENCOUNTER — Inpatient Hospital Stay: Payer: Medicare Other | Attending: Hematology and Oncology | Admitting: Hematology and Oncology

## 2016-01-01 VITALS — BP 118/81 | HR 123 | Temp 96.4°F | Resp 20 | Wt 155.2 lb

## 2016-01-01 VITALS — BP 114/79 | HR 118 | Temp 95.7°F | Resp 18 | Wt 155.2 lb

## 2016-01-01 DIAGNOSIS — I313 Pericardial effusion (noninflammatory): Secondary | ICD-10-CM | POA: Diagnosis not present

## 2016-01-01 DIAGNOSIS — F17210 Nicotine dependence, cigarettes, uncomplicated: Secondary | ICD-10-CM | POA: Insufficient documentation

## 2016-01-01 DIAGNOSIS — G893 Neoplasm related pain (acute) (chronic): Secondary | ICD-10-CM

## 2016-01-01 DIAGNOSIS — Z79811 Long term (current) use of aromatase inhibitors: Secondary | ICD-10-CM | POA: Insufficient documentation

## 2016-01-01 DIAGNOSIS — J439 Emphysema, unspecified: Secondary | ICD-10-CM | POA: Insufficient documentation

## 2016-01-01 DIAGNOSIS — C50412 Malignant neoplasm of upper-outer quadrant of left female breast: Secondary | ICD-10-CM | POA: Insufficient documentation

## 2016-01-01 DIAGNOSIS — C792 Secondary malignant neoplasm of skin: Secondary | ICD-10-CM

## 2016-01-01 DIAGNOSIS — C7989 Secondary malignant neoplasm of other specified sites: Secondary | ICD-10-CM | POA: Insufficient documentation

## 2016-01-01 DIAGNOSIS — Z171 Estrogen receptor negative status [ER-]: Secondary | ICD-10-CM | POA: Insufficient documentation

## 2016-01-01 DIAGNOSIS — Z853 Personal history of malignant neoplasm of breast: Secondary | ICD-10-CM | POA: Insufficient documentation

## 2016-01-01 DIAGNOSIS — C50919 Malignant neoplasm of unspecified site of unspecified female breast: Secondary | ICD-10-CM | POA: Insufficient documentation

## 2016-01-01 DIAGNOSIS — Z9010 Acquired absence of unspecified breast and nipple: Secondary | ICD-10-CM | POA: Diagnosis not present

## 2016-01-01 DIAGNOSIS — C773 Secondary and unspecified malignant neoplasm of axilla and upper limb lymph nodes: Secondary | ICD-10-CM | POA: Diagnosis not present

## 2016-01-01 DIAGNOSIS — Z9221 Personal history of antineoplastic chemotherapy: Secondary | ICD-10-CM | POA: Insufficient documentation

## 2016-01-01 DIAGNOSIS — Z7901 Long term (current) use of anticoagulants: Secondary | ICD-10-CM | POA: Diagnosis not present

## 2016-01-01 DIAGNOSIS — F419 Anxiety disorder, unspecified: Secondary | ICD-10-CM | POA: Insufficient documentation

## 2016-01-01 DIAGNOSIS — C50912 Malignant neoplasm of unspecified site of left female breast: Secondary | ICD-10-CM

## 2016-01-01 DIAGNOSIS — I82622 Acute embolism and thrombosis of deep veins of left upper extremity: Secondary | ICD-10-CM | POA: Insufficient documentation

## 2016-01-01 DIAGNOSIS — G629 Polyneuropathy, unspecified: Secondary | ICD-10-CM | POA: Diagnosis not present

## 2016-01-01 DIAGNOSIS — C7800 Secondary malignant neoplasm of unspecified lung: Secondary | ICD-10-CM | POA: Diagnosis not present

## 2016-01-01 DIAGNOSIS — Z923 Personal history of irradiation: Secondary | ICD-10-CM | POA: Diagnosis not present

## 2016-01-01 DIAGNOSIS — I82A12 Acute embolism and thrombosis of left axillary vein: Secondary | ICD-10-CM

## 2016-01-01 DIAGNOSIS — Z170 Estrogen receptor positive status [ER+]: Secondary | ICD-10-CM | POA: Insufficient documentation

## 2016-01-01 DIAGNOSIS — R0602 Shortness of breath: Secondary | ICD-10-CM

## 2016-01-01 NOTE — Progress Notes (Signed)
Radiation Oncology Follow up Note  Name: [name] [name]   Date:   01/01/2016 MRN:  [id number] DOB: Jun 14, 1950    This 65 y.o. female presents to the clinic today for reevaluation of progressive disease of the left chest wall. 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 She was status post neoadjuvant chemotherapy then underwent left breast and peripheral lymphatic radiation. I saw her back in July and recommended possible short course of palliative radiation therapy. Patient has gone to do with a decline to give her any further radiation therapy. She has progressed in the chest has ulcerative disease although no significant pain.. 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 She is undergone thoracentesis. She's also been tried on Keytruda which has been discontinued. I been asked to reevaluate her for possible further palliative treatment to her chest. She is currently under hospice care.  COMPLICATIONS OF TREATMENT: none  FOLLOW UP COMPLIANCE: keeps appointments   PHYSICAL EXAM:  BP 118/81   Pulse (!) 123   Temp (!) 96.4 F (35.8 C)   Resp 20   Wt 155 lb 3.3 oz (70.4 kg)   BMI 24.31 kg/m  Frail female in NAD. She has significant edema of her left upper extremity from presumed blood clot feet. She also has a bandaged ulcerative lesion of the left chest wall which is fairly extensive. Well-developed well-nourished patient in NAD. HEENT reveals PERLA, EOMI, discs not visualized.  Oral cavity is clear. No oral mucosal lesions are identified. Neck is clear without evidence of cervical or supraclavicular  adenopathy. Lungs are clear to A&P. Cardiac examination is essentially unremarkable with regular rate and rhythm without murmur rub or thrill. Abdomen is benign with no organomegaly or masses noted. Motor sensory and DTR levels are equal and symmetric in the upper and lower extremities. Cranial nerves II through XII are grossly intact. Proprioception is intact. No peripheral adenopathy or edema is identified. No motor or sensory levels are noted. Crude visual fields are within normal range.  RADIOLOGY RESULTS: CT scans are reviewed  PLAN: At this time based on [facility]'s recommendation and my own personal observation I'm declining any further palliative radiation therapy. This to areas received high-dose radiation the past and the minimal amount of radiation I can provide would not provide significant palliation. At this time she is currently under hospice his care and will continued with symptomatic management. I would be happy to reevaluate the patient any time should further palliative treatment be indicated.  I would like to take this opportunity to thank you for allowing me to participate in the care of your patient.Armstead Peaks., MD

## 2016-01-01 NOTE — Progress Notes (Signed)
Patient here today as hospital follow up.  HR 118.

## 2016-01-01 NOTE — Progress Notes (Signed)
[facility] day:  01/01/2016  Chief Complaint: [name] [name] is a 65 y.o. female with metastatic breast cancer who is seen for reassessment after interval hospitalization.  HPI: The patient was last seen in the medical oncology clinic on 12/11/2015.  At that time, she had progressive disease.  Pain was well controlled.  She was short of breath with exertion.  Exam revealed deep left sided necrotic chest wall disease.  She was losing weight.  Pleural fluid from her thoracentesis was positive for breast cancer.  Megace was prescribed.  We discussed possible PleurX catheter if her pleural fluid reaccumulated.  We discuss palliative radiation.  She was admitted to [facility] from 12/16/2015 - 12/19/2015 with shortness of breath.  Chest CT angiogram on 12/16/2015 revealed no definite evidence of pulmonary embolus.   There was a moderate right sided pleural effusion.  She underwent PleurX catheter placement on 12/18/2015.  Left upper extremity duplex on 12/17/2015 revealed a large occlusive DVT extending from the peripheral aspect of the left subclavian vein, through the axillary vein and into at least 1 of the brachial veins.  Lovenox was initiated.  She was discharged on Hospice.  Symptomatically, she notes shortness of breath all the time. She is using oxygen at home. She is using Tramadol for pain. She states that the oxycodone she was prescribed causes her heart rate to go up. She is exhausted. She denies any nausea or vomiting or diarrhea. Hospice nurses are coming out twice a week.  She is also receiving wound care.   Past Medical History:  Diagnosis Date  . Anemia   . Anxiety   . Blood transfusion without reported diagnosis   . Emphysema of lung ([place])   . History of left breast cancer , known active 2013  . Personal history of malignant neoplasm of breast     Past Surgical History:  Procedure Laterality Date  . ABDOMINAL HYSTERECTOMY  1995  .  BREAST BIOPSY Left 2014  . BREAST BIOPSY Left 01-21-14  . BREAST SURGERY Right 1996   mastectomy  . CHEST TUBE INSERTION Right 12/18/2015   Procedure: PLEURX CATHETER INSERTION;  Surgeon: Nestor Lewandowsky, MD;  Location: [facility] ORS;  Service: General;  Laterality: Right;  . mastectomy Right 1997  . NEPHRECTOMY    . oophrectomy  1980    Family History  Problem Relation Age of Onset  . Cancer Father     lung  . Heart disease Sister   . Diabetes Sister   . Heart disease Brother   . Cancer Paternal Grandmother     breast  . Cancer Sister     colon    Social History:  reports that she has been smoking Cigarettes.  She has a 6.25 pack-year smoking history. She has never used smokeless tobacco. She reports that she does not drink alcohol or use drugs.  She is smoking 1/2 pack per day.  She is accompanied by her sister, [name] [name], today.  Allergies:  Allergies  Allergen Reactions  . Sulfa Antibiotics Nausea And Vomiting and Other (See Comments)    headache    Current Medications: Current Outpatient Prescriptions  Medication Sig Dispense Refill  . enoxaparin (LOVENOX) 80 MG/0.8ML injection Inject 0.7 mLs (70 mg total) into the skin every 12 (twelve) hours. 30 Syringe 0  . everolimus (AFINITOR) 10 MG tablet Take 10 mg by mouth daily.    Marland Kitchen exemestane (AROMASIN) 25 MG tablet Take 25 mg by mouth daily after  breakfast.    . feeding supplement, ENSURE ENLIVE, (ENSURE ENLIVE) LIQD Take 237 mLs by mouth 2 (two) times daily between meals. 237 mL 12  . LORazepam (ATIVAN) 0.5 MG tablet Take 0.5 mg by mouth daily as needed for anxiety.   0  . megestrol (MEGACE) 400 MG/10ML suspension Take 5 mLs (200 mg total) by mouth daily. 150 mL 1  . oxyCODONE-acetaminophen (PERCOCET/ROXICET) 5-325 MG tablet Take 1 tablet by mouth every 8 (eight) hours as needed for severe pain. 45 tablet 0  . traMADol (ULTRAM) 50 MG tablet 1 - 2 tablets (50 - 100 mg) every 6 hours as needed for pain 90 tablet 0   No current  facility-administered medications for this visit.    Facility-Administered Medications Ordered in Other Visits  Medication Dose Route Frequency Provider Last Rate Last Dose  . sodium chloride 0.9 % injection 10 mL  10 mL Intravenous PRN Lequita Asal, MD   10 mL at 10/08/14 1335  . sodium chloride 0.9 % injection 10 mL  10 mL Intracatheter PRN Lequita Asal, MD   10 mL at 11/26/14 1355    Review of Systems:  GENERAL:  Fatigue.  No fevers or sweats.  Weight down 15 pounds. PERFORMANCE STATUS (ECOG):  2 HEENT:  No visual changes, runny nose, sore throat, mouth sores or tenderness. Lungs:  Chronic shortness of breath.  No cough.  No hemoptysis. O2 sats normal. Cardiac:  No chest pain, palpitations, orthopnea, or PND. GI:  Appetite poor.  No nausea, vomiting, diarrhea, melena or hematochezia. GU:  No urgency, frequency, dysuria, or hematuria. Musculoskeletal:  No back pain.  No joint pain.  No muscle tenderness.  Interval muscle cramps. Extremities:  No pain or swelling. Skin:  Hair thin.  Chest wall lesions enlarging.  Neuro:  No headache, numbness or weakness, or coordination issues. Endocrine:  No diabetes, thyroid issues, hot flashes or night sweats. Pain:  Chest wall lesion pain well controlled with Tramadol. Review of systems:  All other systems reviewed and found to be negative.   Physical Exam: Blood pressure 114/79, pulse (!) 118, temperature (!) 95.7 F (35.4 C), temperature source Tympanic, resp. rate 18, weight 155 lb 3.3 oz (70.4 kg).  GENERAL: Chronically fatigued appearing woman sitting in a wheelchair under a blanket in the exam room in no acute distress. MENTAL STATUS: Alert and oriented to person, place and time. HEAD: Normocephalic, atraumatic, face symmetric, no Cushingoid features. EYES: Brown eyes. Pupils equal round and reactive to light and accomodation. No conjunctivitis or scleral icterus. ENT: Oropharynx clear without lesion. Tongue normal.  Mucous membranes moist.  BREAST: Dressing covering left anterior chest wall (partially removed).  Oozing at one site if dressing adheres to lesions.  Coalesced deep necrotic open lesions (foul smelling). Extensive cutaneous disease beyond open areas.  Multiple dark nodules on upper abdominal wall to waist line. SKIN: Cutaneous breast cancer noted in breast section. No other lesions.  EXTREMITIES: No edema, no skin discoloration or tenderness. No palpable cords. LYMPH NODES: Left thick axillary ridge. No palpable cervical, supraclavicular, or inguinal adenopathy  NEUROLOGICAL: Unremarkable.  PSYCH: Appropriate.   No visits with results within 3 Day(s) from this visit.  Latest known visit with results is:  Admission on 12/16/2015, Discharged on 12/19/2015  Component Date Value Ref Range Status  . WBC 12/16/2015 8.6  3.6 - 11.0 K/uL Final  . RBC 12/16/2015 3.89  3.80 - 5.20 MIL/uL Final  . Hemoglobin 12/16/2015 11.9* 12.0 - 16.0 g/dL Final  .  HCT 12/16/2015 34.5* 35.0 - 47.0 % Final  . MCV 12/16/2015 88.7  80.0 - 100.0 fL Final  . MCH 12/16/2015 30.5  26.0 - 34.0 pg Final  . MCHC 12/16/2015 34.4  32.0 - 36.0 g/dL Final  . RDW 12/16/2015 17.8* 11.5 - 14.5 % Final  . Platelets 12/16/2015 311  150 - 440 K/uL Final  . Sodium 12/16/2015 132* 135 - 145 mmol/L Final  . Potassium 12/16/2015 3.7  3.5 - 5.1 mmol/L Final  . Chloride 12/16/2015 102  101 - 111 mmol/L Final  . CO2 12/16/2015 26  22 - 32 mmol/L Final  . Glucose, Bld 12/16/2015 92  65 - 99 mg/dL Final  . BUN 12/16/2015 14  6 - 20 mg/dL Final  . Creatinine, Ser 12/16/2015 0.64  0.44 - 1.00 mg/dL Final  . Calcium 12/16/2015 9.1  8.9 - 10.3 mg/dL Final  . Total Protein 12/16/2015 7.0  6.5 - 8.1 g/dL Final  . Albumin 12/16/2015 3.3* 3.5 - 5.0 g/dL Final  . AST 12/16/2015 27  15 - 41 U/L Final  . ALT 12/16/2015 12* 14 - 54 U/L Final  . Alkaline Phosphatase 12/16/2015 73  38 - 126 U/L Final  . Total Bilirubin 12/16/2015 0.2* 0.3  - 1.2 mg/dL Final  . GFR calc non Af Amer 12/16/2015 >60  >60 mL/min Final  . GFR calc Af Amer 12/16/2015 >60  >60 mL/min Final   Comment: (NOTE) The eGFR has been calculated using the CKD EPI equation. This calculation has not been validated in all clinical situations. eGFR's persistently <60 mL/min signify possible Chronic Kidney Disease.   . Anion gap 12/16/2015 4* 5 - 15 Final  . Troponin I 12/16/2015 <0.03  <0.03 ng/mL Final  . Sodium 12/17/2015 134* 135 - 145 mmol/L Final  . Potassium 12/17/2015 3.5  3.5 - 5.1 mmol/L Final  . Chloride 12/17/2015 102  101 - 111 mmol/L Final  . CO2 12/17/2015 24  22 - 32 mmol/L Final  . Glucose, Bld 12/17/2015 93  65 - 99 mg/dL Final  . BUN 12/17/2015 10  6 - 20 mg/dL Final  . Creatinine, Ser 12/17/2015 0.71  0.44 - 1.00 mg/dL Final  . Calcium 12/17/2015 8.6* 8.9 - 10.3 mg/dL Final  . GFR calc non Af Amer 12/17/2015 >60  >60 mL/min Final  . GFR calc Af Amer 12/17/2015 >60  >60 mL/min Final   Comment: (NOTE) The eGFR has been calculated using the CKD EPI equation. This calculation has not been validated in all clinical situations. eGFR's persistently <60 mL/min signify possible Chronic Kidney Disease.   . Anion gap 12/17/2015 8  5 - 15 Final  . WBC 12/17/2015 6.8  3.6 - 11.0 K/uL Final  . RBC 12/17/2015 3.60* 3.80 - 5.20 MIL/uL Final  . Hemoglobin 12/17/2015 10.9* 12.0 - 16.0 g/dL Final  . HCT 12/17/2015 32.0* 35.0 - 47.0 % Final  . MCV 12/17/2015 89.0  80.0 - 100.0 fL Final  . MCH 12/17/2015 30.3  26.0 - 34.0 pg Final  . MCHC 12/17/2015 34.0  32.0 - 36.0 g/dL Final  . RDW 12/17/2015 18.1* 11.5 - 14.5 % Final  . Platelets 12/17/2015 280  150 - 440 K/uL Final  . Troponin I 12/17/2015 <0.03  <0.03 ng/mL Final  . Troponin I 12/17/2015 0.09* <0.03 ng/mL Final   Comment: CRITICAL RESULT CALLED TO, READ BACK BY AND VERIFIED WITH DONNA HESLEP 12/17/15 1441 SGD   . Color, Urine 12/17/2015 YELLOW* YELLOW Final  . APPearance 12/17/2015  HAZY* CLEAR  Final  . Glucose, UA 12/17/2015 NEGATIVE  NEGATIVE mg/dL Final  . Bilirubin Urine 12/17/2015 NEGATIVE  NEGATIVE Final  . Ketones, ur 12/17/2015 NEGATIVE  NEGATIVE mg/dL Final  . Specific Gravity, Urine 12/17/2015 1.033* 1.005 - 1.030 Final  . Hgb urine dipstick 12/17/2015 NEGATIVE  NEGATIVE Final  . pH 12/17/2015 6.0  5.0 - 8.0 Final  . Protein, ur 12/17/2015 NEGATIVE  NEGATIVE mg/dL Final  . Nitrite 12/17/2015 NEGATIVE  NEGATIVE Final  . Leukocytes, UA 12/17/2015 NEGATIVE  NEGATIVE Final  . RBC / HPF 12/17/2015 0-5  0 - 5 RBC/hpf Final  . WBC, UA 12/17/2015 0-5  0 - 5 WBC/hpf Final  . Bacteria, UA 12/17/2015 RARE* NONE SEEN Final  . Squamous Epithelial / LPF 12/17/2015 6-30* NONE SEEN Final  . Mucous 12/17/2015 PRESENT   Final  . Magnesium 12/17/2015 2.0  1.7 - 2.4 mg/dL Final  . Phosphorus 12/17/2015 4.0  2.5 - 4.6 mg/dL Final  . Troponin I 12/17/2015 <0.03  <0.03 ng/mL Final  . aPTT 12/17/2015 26  24 - 36 seconds Final  . Prothrombin Time 12/17/2015 15.2  11.4 - 15.2 seconds Final  . INR 12/17/2015 1.19   Final  . Troponin I 12/17/2015 <0.03  <0.03 ng/mL Final  . Troponin I 12/17/2015 <0.03  <0.03 ng/mL Final  . Troponin I 12/18/2015 <0.03  <0.03 ng/mL Final  . WBC 12/19/2015 9.7  3.6 - 11.0 K/uL Final  . RBC 12/19/2015 3.87  3.80 - 5.20 MIL/uL Final  . Hemoglobin 12/19/2015 11.9* 12.0 - 16.0 g/dL Final  . HCT 12/19/2015 34.4* 35.0 - 47.0 % Final  . MCV 12/19/2015 88.7  80.0 - 100.0 fL Final  . MCH 12/19/2015 30.8  26.0 - 34.0 pg Final  . MCHC 12/19/2015 34.8  32.0 - 36.0 g/dL Final  . RDW 12/19/2015 18.3* 11.5 - 14.5 % Final  . Platelets 12/19/2015 286  150 - 440 K/uL Final    Assessment:  [name] [name] is a 65 y.o. African American woman with a history of stage I right breast cancer s/p modified radical mastectomy on 06/12/1996. Pathology revealed a grade III mutifocal infiltrating ductal carcinoma (tumor size 8.5 cm with an invasive component 1.6 cm). Twenty-one lymph  nodes were negative. Tumor was ER/PR negative. She received 4 cycles of AC at [facility]  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 PET scan on 05/22/2012 revealed multiple areas of disease. She received 6 cycles of Taxotere and Cytoxan (TC) from 06/06/2012 - 09/29/2012. PET scan on 09/07/2012 noted marked improvement. Post chemotherapy biopsy was positive. She was not a surgical candidate. She was placed on Femara.  PET scan on 05/03/2013 revealed a new focus along lateral left breast and a new level II cervical lymph node. She received radiation from 09/13/2013 - 11/05/2013. PET scan on 01/01/2014 revealed new nodule medial left chest wall pleural/paraspinal activity LUL. Multiple biopsies on 01/19/2014 were positive. She began daily letrozole and Ibrance on 02/12/2014. Despite treatment, new nodules formed. She received a total of 2 cycles (delays in therapy).   PET scan on 05/09/2014 revealed marked progression of disease with multifocal masses in chest wall (left > right). She did not receive Imbrance in 04/2014. CA27.29 has increased: 22.9 on 01/08/2014, 54 on 03/05/2014, 170.8 on 05/06/2014, 250.8 on 05/23/2014, 445.8 on 06/14/2014, and 531.1 on 07/02/2014.   She received 3 cycles of Xeloda (02/12 - 07/05/2014). Chest and  abdomen CT scan on 07/05/2014 revealed persistent and slightly progressive diffuse locally invasive chest wall tumor. Bone scan on 07/25/2014 revealed no evidence of metastatic disease.   She received 8 cycles of [place] (08/06/2014 - 01/07/2015) on ACCRU [id number] I. 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  Chest, abdomen, and pelvic CT scan on 10/24/2014 revealed improved multifocal inflammatory left breast cancer.  The dominant 2.6 x 3.5 cm lesion in the left upper outer left breast had decreased to 1.7 x 3.5 cm. The anterior sternal  lesion has decreased from 2 x 4 cm to 1.5 x 3.9 cm. There were no suspicious mediastinal, hilar or axillary adenopathy.    Chest, abdomen, and pelvic CT scan on 01/20/2015 revealed inflammatory left breast cancer with new necrotic appearing subcutaneous nodules in the lateral left breast and lateral left chest wall.   CA27.29 was 339.8 on 08/06/2014, 51.7 on 10/15/2014, 37.6 on 12/10/2014, 26.4 on 01/07/2015, 34.1 on 01/28/2015, and 49.3 on 03/10/2015,  258.6 on 06/12/2015, 421.3 on 07/10/2015, 726.3 on 07/25/2015, 911 on 08/14/2015, 1031 on 09/17/2015, and 1242 on 10/17/2015.  She received 2 cycles of Taxol given 3 weeks on/1 week off (02/10/2015 - 03/10/2015).  Chest and abdomen CT scan on 04/03/2015 revealed interval growth of superficial tumors in the lateral left breast and lateral left chest wall.  In the left lateral chest, disease had increased from 2.3 x 2 to 2.9 x 2.2 cm.  In the superficial lateral left chest wall, disease had increased from 1.7 x 1.2 cm to 2.7 x 1.8 cm.  There were stable findings of locally infiltrative tumor in the left chest wall and left axilla, including extensive left chest wall skin thickening, infiltrative soft tissue encasing the left axillary artery and focal pleural thickening in the anterior left mid pleural space.  There was no metastatic disease in the abdomen.  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  Estrogen receptor was > 90%, progesterone receptor was > 90%, and Her2/neu was 1+.    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 Treatment options of Faslodex (ESR1) and everolimus (approved) and temsirolimus (approved therapy for other tumor types).  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  Mutational burden was TMB-intermediate; 12 Muts/Mb.  She received Faslodex from 05/08/2015 - 09/19/2015. She received 3 cycles of Ibrance (07/26/2015 - 09/19/2015).  Chest, abdomen, and pelvic CT scans on 07/09/2015 revealed interval progression of disease in the left anterior and lateral chest wall.  There was interval development of nodular pleural thickening in the posterior medial left hemi thorax with some focal fluid in the left major fissure. Findings raised concern for interval development of pleural involvement by tumor.  There was no evidence for metastatic disease in the abdomen   Chest, abdomen, and pelvic CT scan on 10/20/2015 revealed progression of subcarinal lymph node (now 1.1 cm).  There was the interval development of trace pericardial effusion with some pericardial nodularity along the left heart border.  There was progression of fissural fluid in the left hemi thorax with increase in left pleural nodularity.  There was progression of left antral lateral chest wall disease with invasion of the left pectoralis major muscle.  Subcutaneous metastatic nodules had progressed.  She received 1 cycle of Aromasin and everolimus (began 10/21/2015 and 10/29/2015).  Disease progressed.  She is s/p 1 cycle of Keytruda (11/20/2015).  Chest CT angiogram on 12/07/2015 revealed no evidence of pulmonary embolism, but a moderate pericardial effusion, increased pleural fluid (small right and trace left), increasing infiltrative neoplasm on the left chest wall, and increase in pleural metastasis along the left heart border.  She underwent right sided thoracentesis on 12/08/2015.  Cytology revealed metastatic mammary lobular carcinoma.  Echo revealed an EF of 45-50% with a small anterior pericardial effusion.  She was admitted to [facility] from 12/16/2015 - 12/19/2015 with shortness of breath.  Chest CT angiogram on 12/16/2015 revealed no definite evidence of pulmonary embolus.   There was a  moderate right sided pleural effusion.  She underwent PleurX catheter placement on 12/18/2015.  Left upper extremity duplex on 12/17/2015 revealed a large occlusive DVT extending from the peripheral aspect of the left subclavian vein, through the axillary vein and into at least 1 of the brachial veins.  She is on Lovenox.  She was discharged on Hospice.  Symptomatically, has progressive disease.  Pain is well controlled.  She has chronic shortness of breath.  Exam reveals extensive chest wall disease with deep left sided necrotic lesions.   Plan: 1.  Discuss current supportive care.  PleurX catheter drainage as needed (currently minimal output). 2.  Discuss conversation with Dr. Baruch Gouty- not a candidate for palliative radiation. 3.  Discuss with Hospice use of liquid morphine for air hunger/shortness of breath. 4.  Support provided patient and sister. 5.  RTC prn.   Lequita Asal, MD  01/01/2016

## 2016-01-03 ENCOUNTER — Encounter: Payer: Self-pay | Admitting: Hematology and Oncology

## 2016-01-06 ENCOUNTER — Encounter: Payer: Medicare Other | Attending: Internal Medicine | Admitting: Internal Medicine

## 2016-01-06 DIAGNOSIS — I959 Hypotension, unspecified: Secondary | ICD-10-CM | POA: Diagnosis not present

## 2016-01-06 DIAGNOSIS — S21002A Unspecified open wound of left breast, initial encounter: Secondary | ICD-10-CM | POA: Diagnosis not present

## 2016-01-06 DIAGNOSIS — C50412 Malignant neoplasm of upper-outer quadrant of left female breast: Secondary | ICD-10-CM | POA: Diagnosis not present

## 2016-01-06 DIAGNOSIS — X58XXXA Exposure to other specified factors, initial encounter: Secondary | ICD-10-CM | POA: Insufficient documentation

## 2016-01-06 DIAGNOSIS — C50812 Malignant neoplasm of overlapping sites of left female breast: Secondary | ICD-10-CM | POA: Diagnosis not present

## 2016-01-06 DIAGNOSIS — Z9221 Personal history of antineoplastic chemotherapy: Secondary | ICD-10-CM | POA: Insufficient documentation

## 2016-01-06 DIAGNOSIS — S21001A Unspecified open wound of right breast, initial encounter: Secondary | ICD-10-CM | POA: Diagnosis not present

## 2016-01-06 NOTE — Progress Notes (Addendum)
[name] [name], [name] [name] ([id number]) Visit Report for 01/06/2016 Arrival Information Details Patient Name: [name] [name], [name] [name] Date of Service: 01/06/2016 10:45 AM Medical Record Patient Account Number: [id number] Number: Treating RN: Baruch Gouty, RN, BSN, Rita [birth year] [phone] y.o. Other Clinician: Date of Birth/Sex: Female) Treating ROBSON, MICHAEL Primary Care Physician/Extender: Cherylann Parr, MELISSA Physician: Referring Physician: Nolon Stalls Weeks in Treatment: 56 Visit Information History Since Last Visit All ordered tests and consults were completed: No Patient Arrived: Ambulatory Added or deleted any medications: No Arrival Time: 10:48 Any new allergies or adverse reactions: No Accompanied By: self Had a fall or experienced change in No Transfer Assistance: None activities of daily living that may affect Patient Identification Verified: Yes risk of falls: Secondary Verification Process Yes Signs or symptoms of abuse/neglect since last No Completed: visito Patient Requires Transmission-Based No Hospitalized since last visit: No Precautions: Has Dressing in Place as Prescribed: Yes Patient Has Alerts: No Pain Present Now: Yes Electronic Signature(s) Signed: 01/06/2016 10:49:22 AM By: Regan Lemming BSN, RN Previous Signature: 01/06/2016 10:48:49 AM Version By: Regan Lemming BSN, RN Entered By: Regan Lemming on 01/06/2016 10:49:21 [name] [name] ([id number]) -------------------------------------------------------------------------------- Clinic Level of Care Assessment Details Patient Name: [name] [name] Date of Service: 01/06/2016 10:45 AM Medical Record Patient Account Number: [id number] Number: Treating RN: Baruch Gouty, RN, BSN, Rita Mar 01, 1951 [phone] y.o. Other Clinician: Date of Birth/Sex: Female) Treating ROBSON, MICHAEL Primary Care Physician/Extender: Cherylann Parr, MELISSA Physician: Referring Physician: Nolon Stalls Weeks in Treatment: 62 Clinic  Level of Care Assessment Items TOOL 4 Quantity Score []  - Use when only an EandM is performed on FOLLOW-UP visit 0 ASSESSMENTS - Nursing Assessment / Reassessment X - Reassessment of Co-morbidities (includes updates in patient status) 1 10 X - Reassessment of Adherence to Treatment Plan 1 5 ASSESSMENTS - Wound and Skin Assessment / Reassessment []  - Simple Wound Assessment / Reassessment - one wound 0 X - Complex Wound Assessment / Reassessment - multiple wounds 1 5 []  - Dermatologic / Skin Assessment (not related to wound area) 0 ASSESSMENTS - Focused Assessment []  - Circumferential Edema Measurements - multi extremities 0 []  - Nutritional Assessment / Counseling / Intervention 0 []  - Lower Extremity Assessment (monofilament, tuning fork, pulses) 0 []  - Peripheral Arterial Disease Assessment (using hand held doppler) 0 ASSESSMENTS - Ostomy and/or Continence Assessment and Care []  - Incontinence Assessment and Management 0 []  - Ostomy Care Assessment and Management (repouching, etc.) 0 PROCESS - Coordination of Care X - Simple Patient / Family Education for ongoing care 1 15 []  - Complex (extensive) Patient / Family Education for ongoing care 0 []  - Staff obtains Consents, Records, Test Results / Process Orders 0 [name] [name] ([id number]) []  - Staff telephones HHA, Nursing Homes / Clarify orders / etc 0 []  - Routine Transfer to another Facility (non-emergent condition) 0 []  - Routine Hospital Admission (non-emergent condition) 0 []  - New Admissions / Biomedical engineer / Ordering NPWT, Apligraf, etc. 0 []  - Emergency Hospital Admission (emergent condition) 0 []  - Simple Discharge Coordination 0 []  - Complex (extensive) Discharge Coordination 0 PROCESS - Special Needs []  - Pediatric / Minor Patient Management 0 []  - Isolation Patient Management 0 []  - Hearing / Language / Visual special needs 0 []  - Assessment of Community assistance (transportation, D/C planning, etc.)  0 []  - Additional assistance / Altered mentation 0 []  - Support Surface(s) Assessment (bed, cushion, seat, etc.) 0 INTERVENTIONS - Wound Cleansing / Measurement []  - Simple Wound Cleansing - one wound  0 X - Complex Wound Cleansing - multiple wounds 1 5 X - Wound Imaging (photographs - any number of wounds) 1 5 []  - Wound Tracing (instead of photographs) 0 []  - Simple Wound Measurement - one wound 0 X - Complex Wound Measurement - multiple wounds 1 5 INTERVENTIONS - Wound Dressings []  - Small Wound Dressing one or multiple wounds 0 X - Medium Wound Dressing one or multiple wounds 1 15 []  - Large Wound Dressing one or multiple wounds 0 []  - Application of Medications - topical 0 []  - Application of Medications - injection 0 [name] [name], [name] INTERVENTIONS - Miscellaneous []  - External ear exam 0 []  - Specimen Collection (cultures, biopsies, blood, body fluids, etc.) 0 []  - Specimen(s) / Culture(s) sent or taken to Lab for analysis 0 []  - Patient Transfer (multiple staff / Harrel Lemon Lift / Similar devices) 0 []  - Simple Staple / Suture removal (25 or less) 0 []  - Complex Staple / Suture removal (26 or more) 0 []  - Hypo / Hyperglycemic Management (close monitor of Blood Glucose) 0 []  - Ankle / Brachial Index (ABI) - do not check if billed separately 0 X - Vital Signs 1 5 Has the patient been seen at the hospital within the last three years: Yes Total Score: 70 Level Of Care: New/Established - Level 2 Electronic Signature(s) Signed: 01/07/2016 4:39:12 PM By: Regan Lemming BSN, RN Entered By: Regan Lemming on 01/07/2016 12:31:14 [name] [name] ([id number]) -------------------------------------------------------------------------------- Encounter Discharge Information Details Patient Name: [name] [name] Date of Service: 01/06/2016 10:45 AM Medical Record Patient Account Number: [id number] Number: Treating RN: Baruch Gouty, RN, BSN, Rita Dec 30, 1950 [phone] y.o. Other  Clinician: Date of Birth/Sex: Female) Treating ROBSON, MICHAEL Primary Care Physician/Extender: Cherylann Parr, MELISSA Physician: Referring Physician: Nolon Stalls Weeks in Treatment: 45 Encounter Discharge Information Items Schedule Follow-up Appointment: No Medication Reconciliation completed and No provided to Patient/Care [name] [name]: Patient Clinical Summary of Care: Declined Electronic Signature(s) Signed: 01/06/2016 11:28:41 AM By: Ruthine Dose Entered By: Ruthine Dose on 01/06/2016 11:28:41 [name] [name] ([id number]) -------------------------------------------------------------------------------- Lower Extremity Assessment Details Patient Name: [name] [name] Date of Service: 01/06/2016 10:45 AM Medical Record Patient Account Number: [id number] Number: Treating RN: Baruch Gouty RN, BSN, Rita 1951-01-04 [phone] y.o. Other Clinician: Date of Birth/Sex: Female) Treating ROBSON, MICHAEL Primary Care Physician/Extender: Cherylann Parr, Buena Physician: Referring Physician: Nolon Stalls Weeks in Treatment: 83 Electronic Signature(s) Signed: 01/06/2016 4:53:50 PM By: Regan Lemming BSN, RN Previous Signature: 01/06/2016 10:49:31 AM Version By: Regan Lemming BSN, RN Entered By: Regan Lemming on 01/06/2016 10:58:05 [name] [name] ([id number]) -------------------------------------------------------------------------------- Multi Wound Chart Details Patient Name: [name] [name] Date of Service: 01/06/2016 10:45 AM Medical Record Patient Account Number: [id number] Number: Treating RN: Baruch Gouty RN, BSN, Rita June 15, 1950 [phone] y.o. Other Clinician: Date of Birth/Sex: Female) Treating ROBSON, MICHAEL Primary Care Physician/Extender: Cherylann Parr, MELISSA Physician: Referring Physician: Nolon Stalls Weeks in Treatment: 48 Vital Signs Height(in): 68 Pulse(bpm): 118 Weight(lbs): 180.5 Blood Pressure 118/67 (mmHg): Body Mass Index(BMI):  27 Temperature(F): 97.7 Respiratory Rate 20 (breaths/min): Photos: [N/A:N/A] Wound Location: Left Chest Left, Distal Chest N/A Wounding Event: Other Lesion Radiation Burn N/A Primary Etiology: Malignant Wound Malignant Wound N/A Comorbid History: Received Chemotherapy, N/A N/A Received Radiation Date Acquired: 05/10/2014 07/13/2015 N/A Weeks of Treatment: 77 25 N/A Wound Status: Open Converted N/A Measurements L x W x D 11x16x0.3 0x0x0 N/A (cm) Area (cm) : 138.23 0 N/A Volume (cm) : 41.469 0 N/A % Reduction in Area: -486.70% 100.00% N/A %  Reduction in Volume: -780.10% 100.00% N/A Classification: Full Thickness Without Full Thickness Without N/A Exposed Support Exposed Support Structures Structures Exudate Amount: Large N/A N/A Exudate Type: Serosanguineous N/A N/A Exudate Color: red, brown N/A N/A Foul Odor After Yes N/A N/A Cleansing: [name] [name], HANDLER. ([id number]) Odor Anticipated Due to No N/A N/A Product Use: Wound Margin: Fibrotic scar, thickened N/A N/A scar Granulation Amount: Small (1-33%) N/A N/A Granulation Quality: Pink, Pale N/A N/A Necrotic Amount: Large (67-100%) N/A N/A Necrotic Tissue: Eschar, Adherent Slough N/A N/A Exposed Structures: Fascia: No N/A N/A Fat: No Tendon: No Muscle: No Joint: No Bone: No Limited to Skin Breakdown Epithelialization: None N/A N/A Periwound Skin Texture: Induration: Yes No Abnormalities Noted N/A Scarring: Yes Edema: No Excoriation: No Callus: No Crepitus: No Fluctuance: No Friable: No Rash: No Periwound Skin No Abnormalities Noted No Abnormalities Noted N/A Moisture: Periwound Skin Color: No Abnormalities Noted No Abnormalities Noted N/A Temperature: No Abnormality N/A N/A Tenderness on Yes No N/A Palpation: Wound Preparation: Ulcer Cleansing: N/A N/A Rinsed/Irrigated with Saline Topical Anesthetic Applied: None Treatment Notes Electronic Signature(s) Signed: 01/07/2016 12:31:43 PM By: Regan Lemming  BSN, RN Entered By: Regan Lemming on 01/07/2016 12:31:43 [name] [name] ([id number]) -------------------------------------------------------------------------------- [name] [name] Date of Service: 01/06/2016 10:45 AM Medical Record Patient Account Number: [id number] Number: Treating RN: [name] [name] 13-Apr-1950 (64 y.o. Other Clinician: Date of Birth/Sex: Female) Treating ROBSON, MICHAEL Primary Care Physician/Extender: Cherylann Parr, MELISSA Physician: Referring Physician: Nolon Stalls Weeks in Treatment: 71 Active Inactive Electronic Signature(s) Signed: 01/07/2016 11:32:14 AM By: Gretta Cool, RN, BSN, Kim RN, BSN Entered By: Gretta Cool, RN, BSN, Kim on 01/07/2016 11:32:12 [name] [name] ([id number]) -------------------------------------------------------------------------------- Pain Assessment Details Patient Name: [name] [name] Date of Service: 01/06/2016 10:45 AM Medical Record Patient Account Number: [id number] Number: Treating RN: Baruch Gouty RN, BSN, Rita May 26, 1950 [phone] y.o. Other Clinician: Date of Birth/Sex: Female) Treating ROBSON, MICHAEL Primary Care Physician/Extender: Cherylann Parr, MELISSA Physician: Referring Physician: Nolon Stalls Weeks in Treatment: 52 Active Problems Location of Pain Severity and Description of Pain Patient Has Paino Yes Site Locations Pain Location: Pain in Ulcers With Dressing Change: No Rate the pain. Current Pain Level: 5 Worst Pain Level: 8 Character of Pain Describe the Pain: Aching, Burning, Tender, Throbbing Pain Management and Medication Current Pain Management: How does your pain impact your activities of daily livingo Sleep: Yes Bathing: Yes Appetite: Yes Relationship With Others: Yes Bladder Continence: Yes Emotions: Yes Bowel Continence: Yes Work: Yes Toileting: Yes Drive: Yes Dressing: Yes Hobbies: Yes Electronic Signature(s) Signed:  01/06/2016 10:49:12 AM By: Regan Lemming BSN, RN Entered By: Regan Lemming on 01/06/2016 10:49:12 [name] [name] ([id number]) [name] [name], [name] [name] ([id number]) -------------------------------------------------------------------------------- Wound Assessment Details Patient Name: [name] [name] Date of Service: 01/06/2016 10:45 AM Medical Record Patient Account Number: [id number] Number: Treating RN: Baruch Gouty, RN, BSN, Rita 1950-05-28 [phone] y.o. Other Clinician: Date of Birth/Sex: Female) Treating ROBSON, MICHAEL Primary Care Physician/Extender: Cherylann Parr, MELISSA Physician: Referring Physician: Nolon Stalls Weeks in Treatment: 77 Wound Status Wound Number: 1 Primary Malignant Wound Etiology: Wound Location: Left Chest Wound Status: Open Wounding Event: Other Lesion Comorbid Received Chemotherapy, Received Date Acquired: 05/10/2014 History: Radiation Weeks Of Treatment: 77 Clustered Wound: No Photos Photo Uploaded By: Regan Lemming on 01/06/2016 17:03:30 Wound Measurements Length: (cm) 11 Width: (cm) 16 Depth: (cm) 0.3 Area: (cm) 138.23 Volume: (cm) 41.469 % Reduction in Area: -486.7% % Reduction in Volume: -780.1% Epithelialization: None Tunneling: No Undermining: No Wound Description  Full Thickness Without Exposed Classification: Support Structures Wound Margin: Fibrotic scar, thickened scar Exudate Large Amount: Exudate Type: Serosanguineous Exudate Color: red, brown Foul Odor After Cleansing: Yes Due to Product Use: No Wound Bed Granulation Amount: Small (1-33%) Exposed Structure Granulation Quality: Pink, Pale Fascia Exposed: No [name] [name], [name] ([id number]) Necrotic Amount: Large (67-100%) Fat Layer Exposed: No Necrotic Quality: Eschar, Adherent Slough Tendon Exposed: No Muscle Exposed: No Joint Exposed: No Bone Exposed: No Limited to Skin Breakdown Periwound Skin Texture Texture Color No Abnormalities Noted: No No Abnormalities  Noted: Yes Callus: No Temperature / Pain Crepitus: No Temperature: No Abnormality Excoriation: No Tenderness on Palpation: Yes Fluctuance: No Friable: No Induration: Yes Localized Edema: No Rash: No Scarring: Yes Moisture No Abnormalities Noted: Yes Wound Preparation Ulcer Cleansing: Rinsed/Irrigated with Saline Topical Anesthetic Applied: None Electronic Signature(s) Signed: 01/06/2016 4:53:50 PM By: Regan Lemming BSN, RN Entered By: Regan Lemming on 01/06/2016 11:03:50 [name] [name] ([id number]) -------------------------------------------------------------------------------- Wound Assessment Details Patient Name: [name] [name] Date of Service: 01/06/2016 10:45 AM Medical Record Patient Account Number: [id number] Number: Treating RN: Baruch Gouty, RN, BSN, Rita Feb 07, 1951 [phone] y.o. Other Clinician: Date of Birth/Sex: Female) Treating ROBSON, MICHAEL Primary Care Physician/Extender: Cherylann Parr, MELISSA Physician: Referring Physician: Nolon Stalls Weeks in Treatment: 77 Wound Status Wound Number: 4 Primary Etiology: Malignant Wound Wound Location: Left, Distal Chest Wound Status: Converted Wounding Event: Radiation Burn Date Acquired: 07/13/2015 Weeks Of Treatment: 25 Clustered Wound: No Photos Photo Uploaded By: Regan Lemming on 01/06/2016 17:03:31 Wound Measurements Length: (cm) 0 % Reducti Width: (cm) 0 % Reducti Depth: (cm) 0 Area: (cm) 0 Volume: (cm) 0 on in Area: 100% on in Volume: 100% Wound Description Full Thickness Without Exposed Classification: Support Structures Periwound Skin Texture Texture Color No Abnormalities Noted: No No Abnormalities Noted: No Moisture No Abnormalities Noted: No Electronic Signature(s) [name] [name], [name] [name] ([id number]) Signed: 01/06/2016 4:53:50 PM By: Regan Lemming BSN, RN Entered By: Regan Lemming on 01/06/2016 11:08:15 [name] [name]  ([id number]) -------------------------------------------------------------------------------- Vitals Details Patient Name: [name] [name] Date of Service: 01/06/2016 10:45 AM Medical Record Patient Account Number: [id number] Number: Treating RN: Baruch Gouty, RN, BSN, Rita Sep 15, 1950 [phone] y.o. Other Clinician: Date of Birth/Sex: Female) Treating ROBSON, MICHAEL Primary Care Physician/Extender: Cherylann Parr, MELISSA Physician: Referring Physician: Nolon Stalls Weeks in Treatment: 70 Vital Signs Time Taken: 10:56 Temperature (F): 97.7 Height (in): 68 Pulse (bpm): 118 Weight (lbs): 180.5 Respiratory Rate (breaths/min): 20 Body Mass Index (BMI): 27.4 Blood Pressure (mmHg): 118/67 Reference Range: 80 - 120 mg / dl Notes Patient came today with oxygen. Left arm swollen which she said is DVt. SHE IS TAKING DAILy lovenox shots. Electronic Signature(s) Signed: 01/06/2016 4:53:50 PM By: Regan Lemming BSN, RN Entered By: Regan Lemming on 01/06/2016 10:57:57

## 2016-01-07 NOTE — Progress Notes (Signed)
MADALYNE, [name] ([id number]) Visit Report for 01/06/2016 Chief Complaint Document Details Patient Name: [name] [name], [name] [name] Date of Service: 01/06/2016 10:45 AM Medical Record Patient Account Number: [id number] Number: Treating RN: Baruch Gouty, RN, BSN, Rita November 28, 1950 [phone] y.o. Other Clinician: Date of Birth/Sex: Female) Treating Nicolet Griffy Primary Care Physician/Extender: Cherylann Parr, MELISSA Physician: Referring Physician: Nolon Stalls Weeks in Treatment: 18 Information Obtained from: Patient Chief Complaint Patient presents to the wound care center for a consult due non healing wound patient is a self-referral who comes with a history of having open wounds to her left chest wall and right chest wall for about 2 months. 07/18/2014 -- since last week the patient has had no complaints and is on her last cycle of chemotherapy. I have reviewed 155 pages of her reports from a medical oncologist and the summary is made in the history of present illness. Electronic Signature(s) Signed: 01/07/2016 8:02:21 AM By: Linton Ham MD Entered By: Linton Ham on 01/06/2016 12:36:29 [name] [name] ([id number]) -------------------------------------------------------------------------------- HPI Details Patient Name: [name] [name] Date of Service: 01/06/2016 10:45 AM Medical Record Patient Account Number: [id number] Number: Treating RN: Baruch Gouty RN, BSN, Rita 1950-10-07 [phone] y.o. Other Clinician: Date of Birth/Sex: Female) Treating Lindy Garczynski Primary Care Physician/Extender: Cherylann Parr, MELISSA Physician: Referring Physician: Nolon Stalls Weeks in Treatment: 36 History of Present Illness HPI Description: this 65 year old patient has come by herself today as a self-referral and has no documentation with her. She has had open wounds to her left chest and the right chest wall for about 2 months. She is not a diabetic and has no significant medical  history except breast cancer. Her right breast cancer was diagnosed 18 years ago and she had a mastectomy and chemotherapy. 3 years ago she was then diagnosed with a breast cancer of the left side which was advanced and could not be operated and was given chemotherapy for a year and then followed by radiation. Her last radiation was over a year ago. She has not had any biopsy done recently from the ulcerated area but she says there have been other biopsies done from the chest wall and these reports are not available at the present time. She was told to keep the wounds open and let them dry out but she is finding it's more and more difficult to stop soiling her clothes if she leaves wounds open. She is on chemotherapy and we will try and obtain these notes from her oncologist. She is not in pain and does not have any significant problems except the discomfort from an open wound on the chest wall. 07/18/2014 This is a summary of [name] [name] date of birth Feb 26, 1951. These are obtained by reviewing 151 pages of medical records which were obtained from the [facility] 65 year old patient who had a history of stage I right breast cancer status post modified radical mastectomy on 06/12/1996. 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 She received 4 cycles of AC at [facility]. In January 2014 she presented with left breast swelling and apparently cellulitis. Breast biopsy done revealed lobular carcinoma which was ER and PR positive and HER-2/neu negative. On PET scan and she was shown to have multiple areas of disease left breast, axilla and supraclavicular areas. She received 6 cycles of chemotherapy and then received Femara because she was not a surgical candidate. PET scan also revealed disease in the left lateral breast and new cervical lymph nodes at  level II. The patient was then sent for radiation therapy and received this over a  month. In January 2016 she had a PET CT scan done which was compatible with marked progression of disease with multifocal soft tissue masses in the chest wall left greater than right. She is currently receiving Xeloda and is noted to have disease in the chest wall which is widely present. Her most recent lab work from 07/02/2014 shows that her CMP is within normal limits with a serum albumin of 4.4. The previous CBC was within normal limits with a WBC count of 4.3 hemoglobin of 14 hematocrit of 40.9 and platelets of 202. 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 We will continue with palliative care and make her comfortable as much as possible as far as her wound care goes. Present time there is no odor but if this does occur we would use carbon- Goldner, Alohilani W. ([id number]) based products to mask the odor. Notes were also reviewed from her surgeon Dr. Hervey Ard --closure on 05/27/2014 for breast biopsy in view of the fact that she had extensive disease on her chest wall. Biopsies are taken from the nodular metastatic disease overlying the sternum and nodules chosen for biopsy. Punch biopsies were taken and the pathology on this confirmed that this was consistent with carcinoma 08/15/2014 -- she is started on a new course of injectable chemotherapy and this is 2 weeks on and 2 weeks off. She has no new symptoms no bleeding from the wounds nor is there any odor. She is doing fine otherwise. 09/16/2014. She is doing very well and the right side wounds have completely healed. She has finished 2 cycles of chemotherapy and she is 2 weeks on and 2 weeks off. He is to restart her chemotherapy this week. No fresh complaints pain is within control and there is no odor from the wounds. 10/21/2014 --  overall she is doing very well and is on a clinical trial with her medical oncologist. No issues with the wound on her left side and there is no odor or drainage. 11/25/2014 -- she continues to be under clinical trial protocol with the medical oncologist and has no fresh issues. 02/17/2015 -- she has been taken off the clinical trial protocol and now is on Taxol on a regular basis with her medical oncologists Dr. Susy Manor. 05/20/15; the patient follow see her for a nonhealing area on the left lateral chest. 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 She has been using Aquacel Ag on this area on a palliative basis. No debridement has been done. 06/17/15; the patient follows in this clinic monthly for a nonhealing area on her left lateral chest. She has known metastatic breast cancer which is metastatic to the chest wall. She returns today with a cluster of open areas on the lateral aspect of her chest. The nurses in the clinic described this as a large open area that progressively closed over. I've taken this opportunity to review her oncology status. On her arrival here she asked me to debridement the wounds on her chest stating that this was a request of [name] her medical oncologist. This is a patient who initially developed stage I right breast cancer treated with a modified  radical mastectomy in 1998. She really presented with inflammatory breast cancer in 2014 post chemotherapy biopsy was positive she was not a surgical candidate. Since then she has been treated with multiple regimens. Most particular to her wounds PET scan in January 2016 revealed marked progression of the disease with multifocal masses in the chest wall left greater than right. 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 Left  chest wall biopsy on 04/29/15 revealed metastatic carcinoma consistent with invasive lobular carcinoma. She has been receiving Faslodex. Weekly. She missed her last injection and is attributing this to the breakdown of tissue on her left chest wall. She was seen by Dr. Merlene Pulling in early March however the record is not complete 07/16/15; patient arrives for her monthly visit the. I've reviewed her oncologist notes Dr. Nelva Nay. Her notes for the increasing nodularity in the chest wall look. Her pain seems adequately controlled with tramadol. The measurements of the necrotic areas seem to be increasing. They are covered with a thick surface slough however I don't believe the debridement is indicated here. She is dressing knees with Aquacel Ag and foam 08/12/15; this is a patient I follow monthly. Her primary oncologist is Dr. Merlene Pulling of the local oncology clinic. The patient lives in [place]. Unfortunately she has expanding necrotic mass in her left anterior chest which is no doubt malignant wounds from her known metastatic breast cancer. She also has a lot of tenderness on the lateral aspect of this wound which might be cellulitis that she appears to have some form of drainage. I have done a culture although this also could be from expanding necrotic tumor 08/19/15; the patient is here for review of culture reports. At the far left area of her necrotic wound was [name] [name], [name] [name]. ([id number]) purulent drainage last week. This grew both Citrobacter Koseri and MRSA. The patient does not feel systemically unwell although she is still having pain in the area. No fever. She is now on Ibrance for her metastatic breast cancer 08/26/15; patient is just about completed her antibiotics. There is less drainage but still some tenderness on the lateral aspect of her necrotic chest wall wound. She has not been systemically unwell 09/23/15 there is extension of the necrotic malignant ulcer across her left  anterior chest. Surrounding tissue is probably also involved including extensively medially from the wound and also laterally into the axilla. There is no evidence of infection. The patient has an appointment with General surgery tomorrow apparently arranged by her oncologist. 10/21/15: pt reports cancerous lesion progressing. reports scan today. awaiting results. she denies pain or discomfort associated with her wound. 11/18/2015 -- seen this patient for a long while but she comes back with a rather large fungating lesion of her left anterior chest wall 01/06/16; patient arrives today now under hospice care. She was hospitalized for a DVT in her left arm and is now on Lovenox. She also has a pleural drain in the right side of her chest apparently for a pleural effusion. I have not looked up the details on either one of these issues. She is now being followed under hospice care. With regards to her wound this is clearly deeper and necrotic although not particularly malodorous Electronic Signature(s) Signed: 01/07/2016 8:02:21 AM By: Baltazar Najjar MD Entered By: Baltazar Najjar on 01/06/2016 12:38:08 [name] [name] ([id number]) -------------------------------------------------------------------------------- Physical Exam Details Patient Name: [name] [name].  Date of Service: 01/06/2016 10:45 AM Medical Record Patient Account Number: [id number] Number: Treating RN: Baruch Gouty, RN, BSN, Rita [birth year] [phone] y.o. Other Clinician: Date of Birth/Sex: Female) Treating Celester Morgan Primary Care Physician/Extender: Cherylann Parr, MELISSA Physician: Referring Physician: Nolon Stalls Weeks in Treatment: 72 Constitutional Sitting or standing Blood Pressure is within target range for patient.. Pulse regular and within target range for patient.Marland Kitchen Respirations regular, non-labored and within target range.. Temperature is normal and within the target range for the patient.. Patient's  appearance is neat and clean. Appears in no acute distress. Well nourished and well developed.Marland Kitchen Respiratory Respiratory effort is easy and symmetric bilaterally. Rate is normal at rest and on room air.. Bilateral breath sounds are clear and equal in all lobes with no wheezes, rales or rhonchi.. Cardiovascular Heart rhythm and rate regular, without murmur or gallop. JVP at 2 cm above the sternal angle at 45. There is still extensive edema in the left arm which is pitting. No major tenderness. She is attempting to keep her arm elevated. Chest Large necrotic area across her left anterior chest as clearly extended period. Lymphatic Could not feel any nodes in the left axilla over the skin here is firm and I wonder whether this was included and a radiation area.Marland Kitchen Psychiatric Patient looks somewhat fatigued. Notes Wound exam; extensive wound across her left anterior chest clearly with necrotic coverage and deep tissue invasion. There is no evidence of surrounding infection no drainage and no odor Electronic Signature(s) Signed: 01/07/2016 8:02:21 AM By: Linton Ham MD Entered By: Linton Ham on 01/06/2016 12:44:06 [name] [name] ([id number]) -------------------------------------------------------------------------------- Physician Orders Details Patient Name: [name] [name] Date of Service: 01/06/2016 10:45 AM Medical Record Patient Account Number: [id number] Number: Treating RN: Baruch Gouty RN, BSN, Rita [birth year] [phone] y.o. Other Clinician: Date of Birth/Sex: Female) Treating Wynnie Pacetti Primary Care Physician/Extender: Cherylann Parr, MELISSA Physician: Referring Physician: Nolon Stalls Weeks in Treatment: 80 Verbal / Phone Orders: Yes Clinician: Afful, RN, BSN, Rita Read Back and Verified: Yes Diagnosis Coding Wound Cleansing Wound #1 Left Chest o Cleanse wound with mild soap and water o May Shower, gently pat wound dry prior to applying new  dressing. Primary Wound Dressing Wound #1 Left Chest o Aquacel Ag Secondary Dressing Wound #1 Left Chest o ABD pad o Dry Gauze Dressing Change Frequency Wound #1 Left Chest o Change dressing every day. Additional Orders / Instructions Wound #1 Left Chest o Other: - Carboflex may be used for odor control. Discharge From [facility] Wound #1 Left Chest o Discharge from [facility] Completed. Discharge into hospice services. Will call if in need of Supply orders. Electronic Signature(s) Signed: 01/06/2016 4:53:50 PM By: Regan Lemming BSN, RN Signed: 01/07/2016 8:02:21 AM By: Linton Ham MD Entered By: Regan Lemming on 01/06/2016 11:16:33 [name] [name] ([id number]) [name] [name], [name] [name] ([id number]) -------------------------------------------------------------------------------- Problem List Details Patient Name: [name] [name] Date of Service: 01/06/2016 10:45 AM Medical Record Patient Account Number: [id number] Number: Treating RN: Baruch Gouty, RN, BSN, Rita Aug 24, 1950 [phone] y.o. Other Clinician: Date of Birth/Sex: Female) Treating Aahna Rossa Primary Care Physician/Extender: Cherylann Parr, MELISSA Physician: Referring Physician: Nolon Stalls Weeks in Treatment: 51 Active Problems ICD-10 Encounter Code Description Active Date Diagnosis C50.812 Malignant neoplasm of overlapping sites of left female 07/11/2014 Yes breast S21.001A Unspecified open wound of right breast, initial encounter 07/11/2014 Yes C50.412 Malignant neoplasm of upper-outer quadrant of left female 07/11/2014 Yes breast S21.002A Unspecified open wound of left breast, initial encounter 07/11/2014 Yes  Inactive Problems Resolved Problems Electronic Signature(s) Signed: 01/07/2016 8:02:21 AM By: Linton Ham MD Entered By: Linton Ham on 01/06/2016 12:32:50 [name] [name]  ([id number]) -------------------------------------------------------------------------------- Progress Note Details Patient Name: [name] [name] Date of Service: 01/06/2016 10:45 AM Medical Record Patient Account Number: [id number] Number: Treating RN: Baruch Gouty RN, BSN, Rita 1950-09-29 [phone] y.o. Other Clinician: Date of Birth/Sex: Female) Treating Kalyb Pemble Primary Care Physician/Extender: Cherylann Parr, MELISSA Physician: Referring Physician: Nolon Stalls Weeks in Treatment: 75 Subjective Chief Complaint Information obtained from Patient Patient presents to the wound care center for a consult due non healing wound patient is a self-referral who comes with a history of having open wounds to her left chest wall and right chest wall for about 2 months. 07/18/2014 -- since last week the patient has had no complaints and is on her last cycle of chemotherapy. I have reviewed 155 pages of her reports from a medical oncologist and the summary is made in the history of present illness. History of Present Illness (HPI) this 65 year old patient has come by herself today as a self-referral and has no documentation with her. She has had open wounds to her left chest and the right chest wall for about 2 months. She is not a diabetic and has no significant medical history except breast cancer. Her right breast cancer was diagnosed 18 years ago and she had a mastectomy and chemotherapy. 3 years ago she was then diagnosed with a breast cancer of the left side which was advanced and could not be operated and was given chemotherapy for a year and then followed by radiation. Her last radiation was over a year ago. She has not had any biopsy done recently from the ulcerated area but she says there have been other biopsies done from the chest wall and these reports are not available at the present time. She was told to keep the wounds open and let them dry out but she is finding it's more  and more difficult to stop soiling her clothes if she leaves wounds open. She is on chemotherapy and we will try and obtain these notes from her oncologist. She is not in pain and does not have any significant problems except the discomfort from an open wound on the chest wall. 07/18/2014 This is a summary of Kensly Bowmer date of birth 05-02-1950. These are obtained by reviewing 151 pages of medical records which were obtained from the [facility] 65 year old patient who had a history of stage I right breast cancer status post modified radical mastectomy on 06/12/1996. 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 She received 4 cycles of AC at [facility]. In January 2014 she presented with left breast swelling and apparently cellulitis. Breast biopsy done revealed lobular carcinoma which was ER and PR positive and HER-2/neu negative. On PET scan and she was shown to have multiple areas of disease left breast, axilla and supraclavicular areas. She received 6 cycles of chemotherapy and then received Femara because she was not a surgical candidate. PET scan also revealed disease in the left lateral breast and new cervical lymph nodes at level II. The patient was then sent for radiation therapy and received this over a month. In January 2016 she had a PET CT scan done which was compatible with marked progression of disease Labombard, Jaelee W. ([id number]) with multifocal soft tissue masses in the chest wall left greater than right. She is currently receiving Xeloda and is  noted to have disease in the chest wall which is widely present. Her most recent lab work from 07/02/2014 shows that her CMP is within normal limits with a serum albumin of 4.4. The previous CBC was within normal limits with a WBC count of 4.3 hemoglobin of 14 hematocrit of 40.9 and platelets of 202. After reading the reports as above I believe that this patient has  extensive stage IV breast carcinoma which has now infiltrated into the skin of the chest wall and has fungating tumors coming through in several places especially on the left. We will continue with palliative care and make her comfortable as much as possible as far as her wound care goes. Present time there is no odor but if this does occur we would use carbon- based products to mask the odor. Notes were also reviewed from her surgeon Dr. Hervey Ard --closure on 05/27/2014 for breast biopsy in view of the fact that she had extensive disease on her chest wall. Biopsies are taken from the nodular metastatic disease overlying the sternum and nodules chosen for biopsy. Punch biopsies were taken and the pathology on this confirmed that this was consistent with carcinoma 08/15/2014 -- she is started on a new course of injectable chemotherapy and this is 2 weeks on and 2 weeks off. She has no new symptoms no bleeding from the wounds nor is there any odor. She is doing fine otherwise. 09/16/2014. She is doing very well and the right side wounds have completely healed. She has finished 2 cycles of chemotherapy and she is 2 weeks on and 2 weeks off. He is to restart her chemotherapy this week. No fresh complaints pain is within control and there is no odor from the wounds. 10/21/2014 -- overall she is doing very well and is on a clinical trial with her medical oncologist. No issues with the wound on her left side and there is no odor or drainage. 11/25/2014 -- she continues to be under clinical trial protocol with the medical oncologist and has no fresh issues. 02/17/2015 -- she has been taken off the clinical trial protocol and now is on Taxol on a regular basis with her medical oncologists Dr. Susy Manor. 05/20/15; the patient follow see her for a nonhealing area on the left lateral chest. 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 She  has been using Aquacel Ag on this area on a palliative basis. No debridement has been done. 06/17/15; the patient follows in this clinic monthly for a nonhealing area on her left lateral chest. She has known metastatic breast cancer which is metastatic to the chest wall. She returns today with a cluster of open areas on the lateral aspect of her chest. The nurses in the clinic described this as a large open area that progressively closed over. I've taken this opportunity to review her oncology status. On her arrival here she asked me to debridement the wounds on her chest stating that this was a request of [name] her medical oncologist. This is a patient who initially developed stage I right breast cancer treated with a modified radical mastectomy in 1998. She really presented with inflammatory breast cancer in 2014 post chemotherapy biopsy was positive she was not a surgical candidate. Since then she has been treated with multiple regimens. Most particular to her wounds PET scan in January 2016 revealed marked progression of the disease with multifocal masses in the chest wall left greater  than right. 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 She has been receiving Faslodex. Weekly. She missed her last injection and is attributing this to the breakdown of tissue on her left chest wall. She was seen by [name] in early March however the record is not complete 07/16/15; patient arrives for her monthly visit the. I've reviewed her oncologist notes Dr. Nolon Stalls. Her notes for the increasing nodularity in the chest wall look. Her pain seems adequately controlled with [name] [name], [name] [name]. ([id number]) tramadol. The measurements of the necrotic areas  seem to be increasing. They are covered with a thick surface slough however I don't believe the debridement is indicated here. She is dressing knees with Aquacel Ag and foam 08/12/15; this is a patient I follow monthly. Her primary oncologist is [name] of the local oncology clinic. The patient lives in [place]. Unfortunately she has expanding necrotic mass in her left anterior chest which is no doubt malignant wounds from her known metastatic breast cancer. She also has a lot of tenderness on the lateral aspect of this wound which might be cellulitis that she appears to have some form of drainage. I have done a culture although this also could be from expanding necrotic tumor 08/19/15; the patient is here for review of culture reports. At the far left area of her necrotic wound was purulent drainage last week. This grew both Citrobacter Koseri and MRSA. The patient does not feel systemically unwell although she is still having pain in the area. No fever. She is now on Ibrance for her metastatic breast cancer 08/26/15; patient is just about completed her antibiotics. There is less drainage but still some tenderness on the lateral aspect of her necrotic chest wall wound. She has not been systemically unwell 09/23/15 there is extension of the necrotic malignant ulcer across her left anterior chest. Surrounding tissue is probably also involved including extensively medially from the wound and also laterally into the axilla. There is no evidence of infection. The patient has an appointment with General surgery tomorrow apparently arranged by her oncologist. 10/21/15: pt reports cancerous lesion progressing. reports scan today. awaiting results. she denies pain or discomfort associated with her wound. 11/18/2015 -- seen this patient for a long while but she comes back with a rather large fungating lesion of her left anterior chest wall 01/06/16; patient arrives today now under hospice care. She was  hospitalized for a DVT in her left arm and is now on Lovenox. She also has a pleural drain in the right side of her chest apparently for a pleural effusion. I have not looked up the details on either one of these issues. She is now being followed under hospice care. With regards to her wound this is clearly deeper and necrotic although not particularly malodorous Objective Constitutional Sitting or standing Blood Pressure is within target range for patient.. Pulse regular and within target range for patient.Marland Kitchen Respirations regular, non-labored and within target range.. Temperature is normal and within the target range for the patient.. Patient's appearance is neat and clean. Appears in no acute distress. Well nourished and well developed.. Vitals Time Taken: 10:56 AM, Height: 68 in, Weight: 180.5 lbs, BMI: 27.4, Temperature: 97.7 F, Pulse: 118 bpm, Respiratory Rate: 20 breaths/min, Blood Pressure: 118/67 mmHg. General Notes: Patient came today with  oxygen. Left arm swollen which she said is DVt. SHE IS TAKING DAILy lovenox shots. Respiratory Respiratory effort is easy and symmetric bilaterally. Rate is normal at rest and on room air.. Bilateral breath sounds are clear and equal in all lobes with no wheezes, rales or rhonchi.[name] [name], [name] [name] ([id number]) Cardiovascular Heart rhythm and rate regular, without murmur or gallop. JVP at 2 cm above the sternal angle at 45. There is still extensive edema in the left arm which is pitting. No major tenderness. She is attempting to keep her arm elevated. Chest Large necrotic area across her left anterior chest as clearly extended period. Lymphatic Could not feel any nodes in the left axilla over the skin here is firm and I wonder whether this was included and a radiation area.Marland Kitchen Psychiatric Patient looks somewhat fatigued. General Notes: Wound exam; extensive wound across her left anterior chest clearly with necrotic coverage and deep tissue  invasion. There is no evidence of surrounding infection no drainage and no odor Integumentary (Hair, Skin) Wound #1 status is Open. Original cause of wound was Other Lesion. The wound is located on the Left Chest. The wound measures 11cm length x 16cm width x 0.3cm depth; 138.23cm^2 area and 41.469cm^3 volume. The wound is limited to skin breakdown. There is no tunneling or undermining noted. There is a large amount of serosanguineous drainage noted. The wound margin is fibrotic, thickened scar. There is small (1-33%) pink, pale granulation within the wound bed. There is a large (67-100%) amount of necrotic tissue within the wound bed including Eschar and Adherent Slough. The periwound skin appearance had no abnormalities noted for moisture. The periwound skin appearance had no abnormalities noted for color. The periwound skin appearance exhibited: Induration, Scarring. The periwound skin appearance did not exhibit: Callus, Crepitus, Excoriation, Fluctuance, Friable, Localized Edema, Rash. Periwound temperature was noted as No Abnormality. The periwound has tenderness on palpation. Wound #4 status is Converted. Original cause of wound was Radiation Burn. The wound is located on the Left,Distal Chest. The wound measures 0cm length x 0cm width x 0cm depth; 0cm^2 area and 0cm^3 volume. Assessment Active Problems ICD-10 C50.812 - Malignant neoplasm of overlapping sites of left female breast S21.001A - Unspecified open wound of right breast, initial encounter C50.412 - Malignant neoplasm of upper-outer quadrant of left female breast S21.002A - Unspecified open wound of left breast, initial encounter [name] [name], [name] [name] ([id number]) Plan Wound Cleansing: Wound #1 Left Chest: Cleanse wound with mild soap and water May Shower, gently pat wound dry prior to applying new dressing. Primary Wound Dressing: Wound #1 Left Chest: Aquacel Ag Secondary Dressing: Wound #1 Left Chest: ABD pad Dry  Gauze Dressing Change Frequency: Wound #1 Left Chest: Change dressing every day. Additional Orders / Instructions: Wound #1 Left Chest: Other: - Carboflex may be used for odor control. Discharge From [facility] Services: Wound #1 Left Chest: Discharge from [facility] Completed. Discharge into hospice services. Will call if in need of Supply orders. #1 extensive distruction across her anterior chest from invasive breast carcinoma. #2 I don't really think it's necessary for this patient to come back to see [place]. I would be glad to assist hospice in anyway I can for this patient with silver alginate would provide a good palliative dressing here that I see no particular reason to change this. #3 I told the patient I didn't think it was necessary for her to come back to clinic however we will be glad to see her if another  need arises. Any hospice related issues such is signing paperwork will be taking care of as long as this is necessary. #4 extensive left arm edema in spite of the use of Lovenox. I would wonder if there is some more some was obstruction. Fortunately she feels well right now. #5 her respiratory exam was quite normal at the bedside almost surprisingly. Electronic Signature(s) [name] [name], [name] [name] ([id number]) Signed: 01/07/2016 8:02:21 AM By: Linton Ham MD Entered By: Linton Ham on 01/06/2016 12:46:18 [name] [name] ([id number]) -------------------------------------------------------------------------------- SuperBill Details Patient Name: [name] [name] Date of Service: 01/06/2016 Medical Record Patient Account Number: [id number] Number: Treating RN: Baruch Gouty RN, BSN, Rita 1951/03/07 [phone] y.o. Other Clinician: Date of Birth/Sex: Female) Treating Sanjuanita Condrey Primary Care Physician/Extender: Cherylann Parr, MELISSA Physician: Weeks in Treatment: 77 Referring Physician: Nolon Stalls Diagnosis Coding ICD-10 Codes Code  Description [phone] Malignant neoplasm of overlapping sites of left female breast S21.001A Unspecified open wound of right breast, initial encounter C50.412 Malignant neoplasm of upper-outer quadrant of left female breast S21.002A Unspecified open wound of left breast, initial encounter Facility Procedures CPT4 Code: [id number] Description: [phone] - WOUND CARE VISIT-LEV 2 EST PT Modifier: Quantity: 1 Physician Procedures CPT4 Code: [id number] Description: 46047 - WC PHYS LEVEL 3 - EST PT ICD-10 Description Diagnosis C50.812 Malignant neoplasm of overlapping sites of left Modifier: female breast Quantity: 1 Electronic Signature(s) Signed: 01/07/2016 12:31:32 PM By: Regan Lemming BSN, RN Previous Signature: 01/07/2016 8:02:21 AM Version By: Linton Ham MD Entered By: Regan Lemming on 01/07/2016 12:31:31

## 2016-01-09 ENCOUNTER — Telehealth: Payer: Self-pay | Admitting: Cardiothoracic Surgery

## 2016-01-09 NOTE — Telephone Encounter (Signed)
Lattie Haw, RN from Hospice called asking if patient needed to follow up with Dr. Genevive Bi. I told her that she did not. However, Lattie Haw was wondering if she was able to remove patient's stitches so she doesn't come here to have them removed. I told her that she was able to since it has been more than two weeks. Lattie Haw stated that she would remove her stitches. I told her to call us if she had any further questions.

## 2016-01-09 NOTE — Telephone Encounter (Signed)
ERROR

## 2016-01-17 ENCOUNTER — Other Ambulatory Visit: Payer: Self-pay | Admitting: Hematology and Oncology

## 2016-01-17 DIAGNOSIS — R0602 Shortness of breath: Secondary | ICD-10-CM

## 2016-01-17 MED ORDER — MORPHINE SULFATE (CONCENTRATE) 10 MG /0.5 ML PO SOLN: 5.0000 mg | ORAL | 0 refills | Status: DC | PRN

## 2016-01-22 ENCOUNTER — Other Ambulatory Visit: Payer: Self-pay | Admitting: *Deleted

## 2016-01-22 MED ORDER — TRAMADOL HCL 50 MG PO TABS: ORAL_TABLET | ORAL | 0 refills | Status: DC

## 2016-01-27 ENCOUNTER — Telehealth: Payer: Self-pay | Admitting: *Deleted

## 2016-01-27 MED ORDER — METRONIDAZOLE 500 MG PO TABS: ORAL_TABLET | ORAL | 3 refills | Status: AC

## 2016-01-27 NOTE — Telephone Encounter (Signed)
Asking for an order for Flagyl 500 mg to be crushed and used on wound with Metrogel to be changed daily to help with odor control. Please advise.

## 2016-01-27 NOTE — Telephone Encounter (Addendum)
Message to [place] left on her VM the Rx sent to pharmacy per VO Dr Mike Gip

## 2016-02-09 ENCOUNTER — Other Ambulatory Visit: Payer: Self-pay | Admitting: *Deleted

## 2016-02-09 MED ORDER — MORPHINE SULFATE (CONCENTRATE) 10 MG /0.5 ML PO SOLN: ORAL | 0 refills | Status: DC

## 2016-02-09 NOTE — Telephone Encounter (Signed)
Needs order signed and returned regarding pleurex draining twice a week due to patient intolerance to draw more than 500 ml at time

## 2016-02-09 NOTE — Telephone Encounter (Signed)
Per VO Dr Mike Gip, ok to drain pleurex twice a week. Lisa inofrmed

## 2016-02-10 ENCOUNTER — Telehealth: Payer: Self-pay | Admitting: *Deleted

## 2016-02-10 MED ORDER — APIXABAN 5 MG PO TABS: 5.0000 mg | ORAL_TABLET | Freq: Two times a day (BID) | ORAL | 2 refills | Status: AC

## 2016-02-10 MED ORDER — TRAMADOL HCL 50 MG PO TABS: ORAL_TABLET | ORAL | 0 refills | Status: AC

## 2016-02-10 NOTE — Telephone Encounter (Signed)
Per Dr Mike Gip, can be switched to Eliquis, Xarelto, or coumadin if they are going to monitor labs for this. Per Lattie Haw they will cover Eliquis and she will call it in for her

## 2016-02-10 NOTE — Telephone Encounter (Signed)
Dr Nyra Capes is requesting patient be taken off Lovenox and started on an oral anticoagulant. Please advise.

## 2016-02-19 ENCOUNTER — Other Ambulatory Visit: Payer: Self-pay | Admitting: *Deleted

## 2016-02-19 MED ORDER — MORPHINE SULFATE (CONCENTRATE) 10 MG /0.5 ML PO SOLN: ORAL | 0 refills | Status: AC

## 2016-03-12 DEATH — deceased

## 2016-07-23 ENCOUNTER — Other Ambulatory Visit: Payer: Self-pay | Admitting: Nurse Practitioner

## 2016-09-02 ENCOUNTER — Other Ambulatory Visit: Payer: Self-pay | Admitting: *Deleted

## 2016-09-02 NOTE — Telephone Encounter (Signed)
Error

## 2016-09-07 IMAGING — CT CT CHEST W/ CM
1 of 3 series · 12 of 32 positions shown, 17 images · IV contrast (omnipaque)
Comparison: 01/20/2015 CT chest, abdomen and pelvis.

CLINICAL DATA: Remote history of right breast cancer status post
right mastectomy. Left breast invasive lobular cancer diagnosed in
April 2012. Patient presents for restaging status post
chemotherapy.

EXAM:
CT CHEST AND ABDOMEN WITH CONTRAST
TECHNIQUE: Multidetector CT imaging of the chest and abdomen was performed
following the standard protocol during bolus administration of
intravenous contrast.
CONTRAST:  85mL OMNIPAQUE IOHEXOL 300 MG/ML  SOLN

[Series 2: cap with · axial · 0.65mm/px · z∈[-96,+280]mm · 12 of 87 slices shown, 17 images]
[im 6/87  soft-tissue]
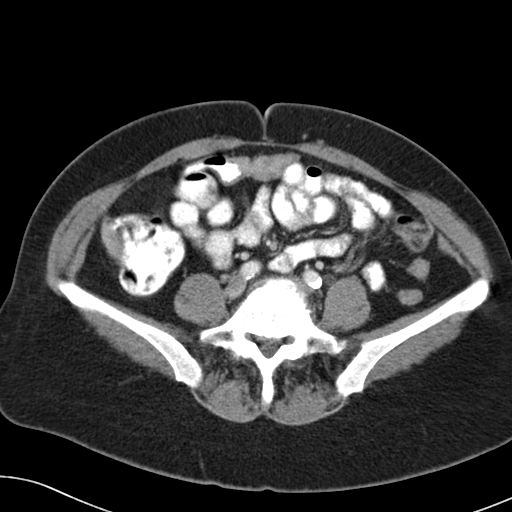
[im 6/87  bone]
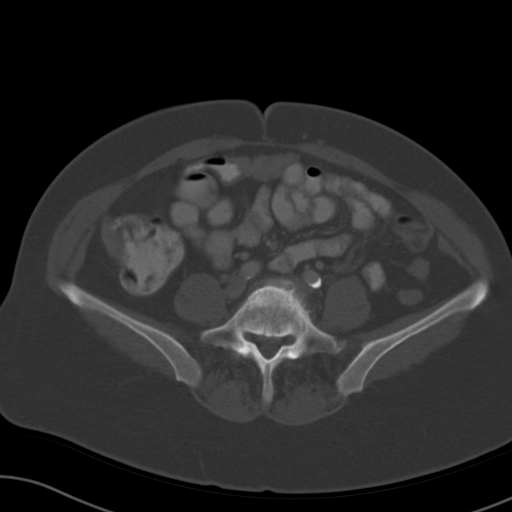
[im 12/87  soft-tissue]
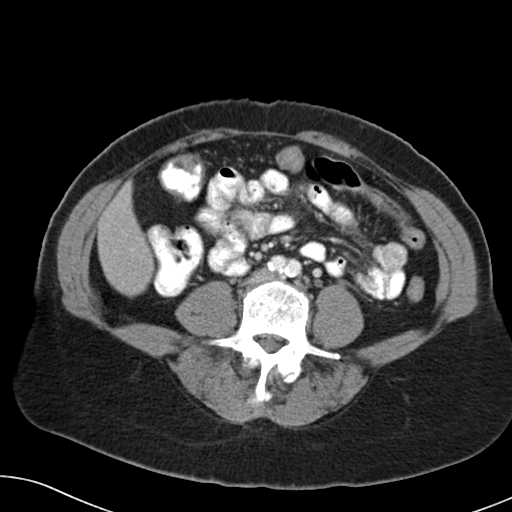
[im 23/87  soft-tissue]
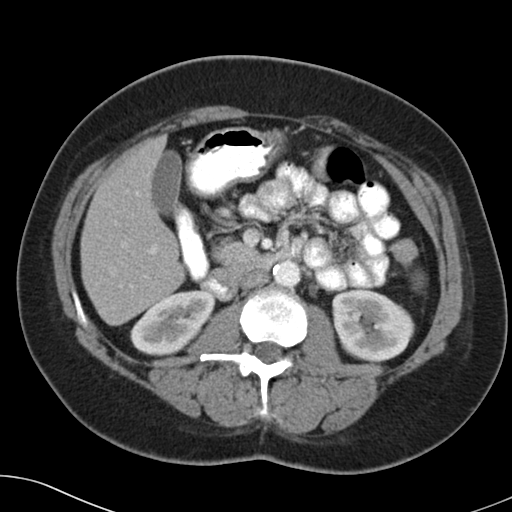
[im 29/87  soft-tissue]
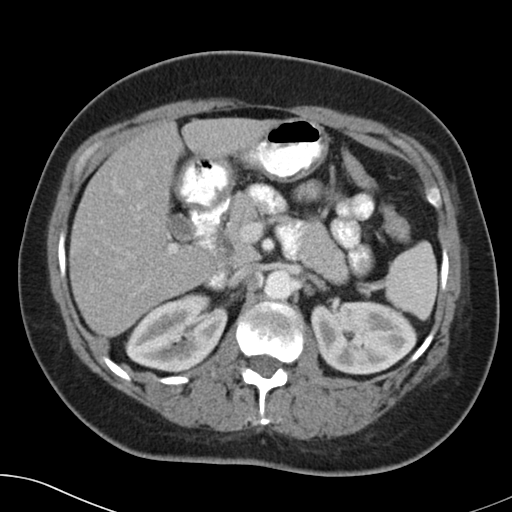
[im 35/87  soft-tissue]
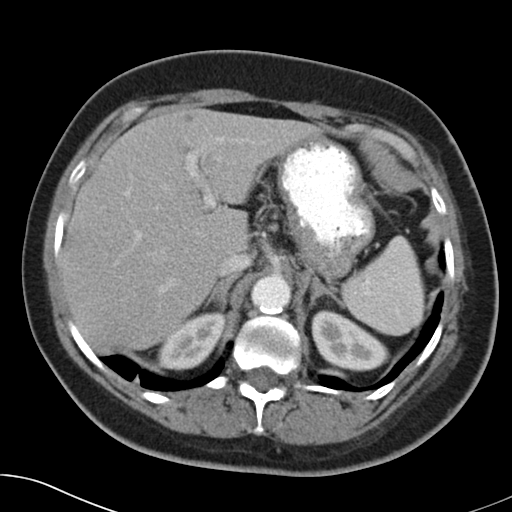
[im 46/87  soft-tissue]
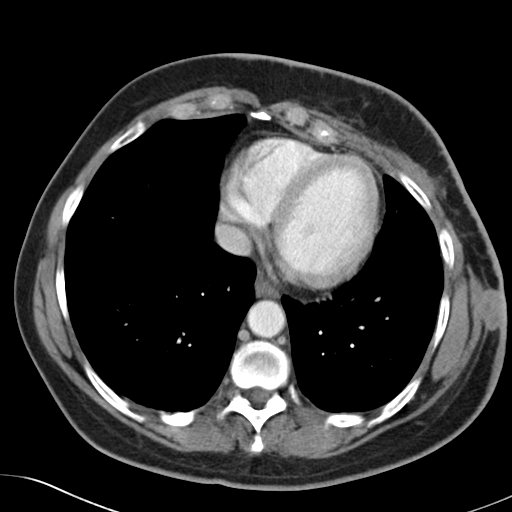
[im 52/87  soft-tissue]
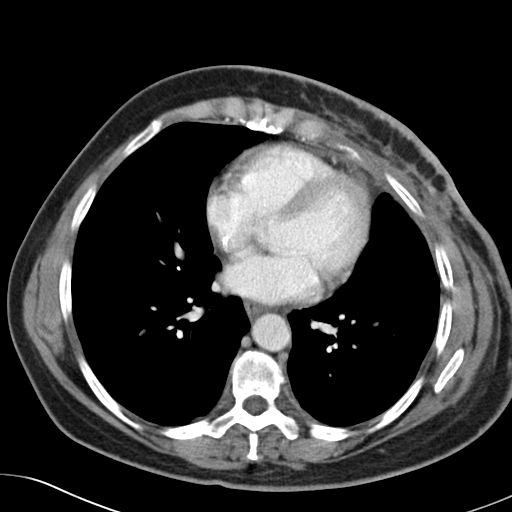
[im 58/87  soft-tissue]
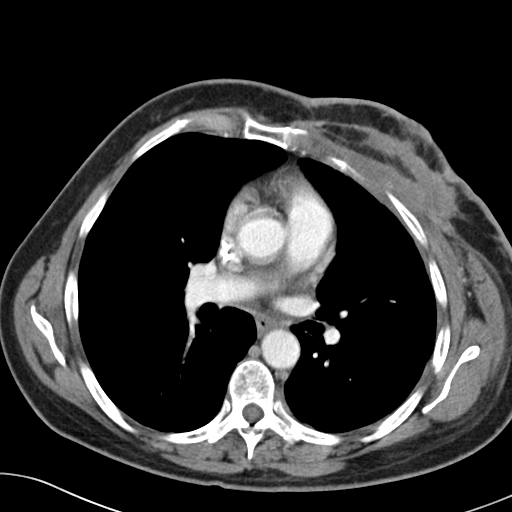
[im 64/87  soft-tissue]
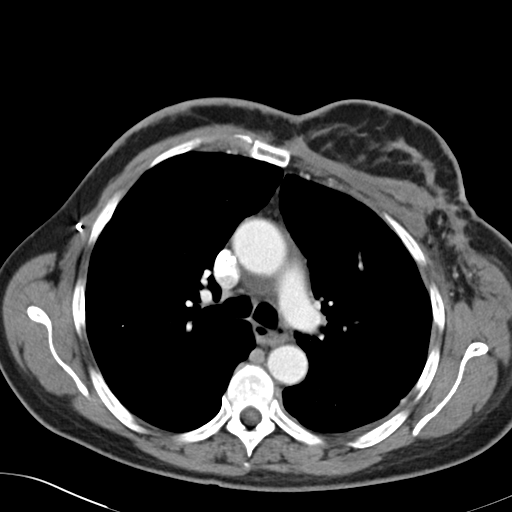
[im 64/87  lung]
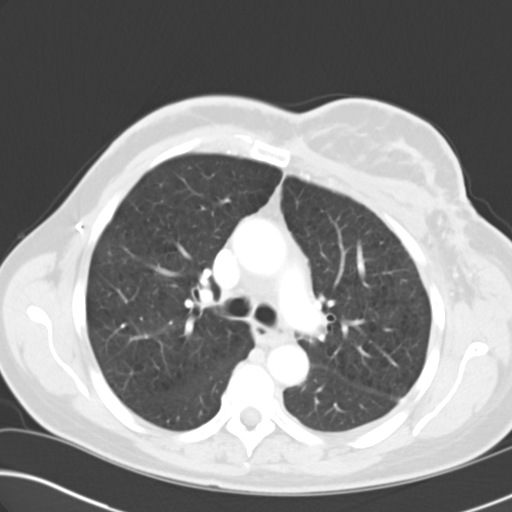
[im 64/87  bone]
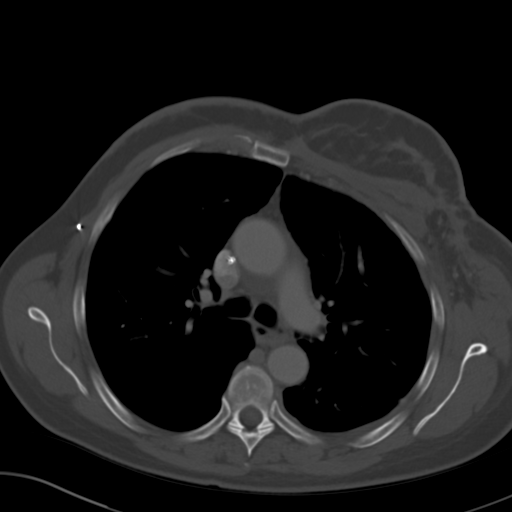
[im 69/87  lung]
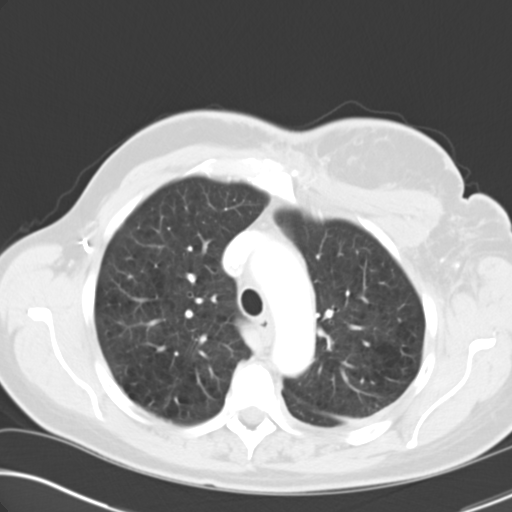
[im 75/87  soft-tissue]
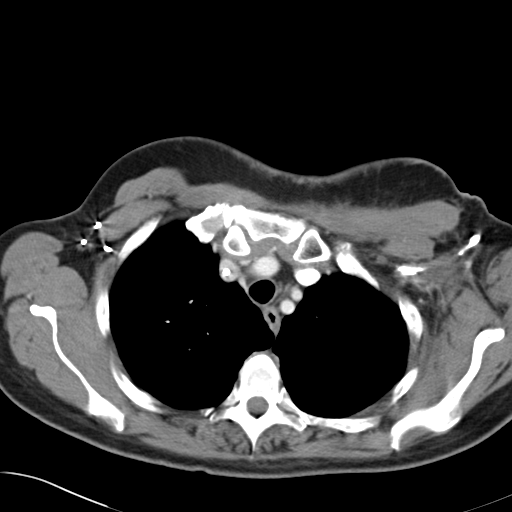
[im 75/87  lung]
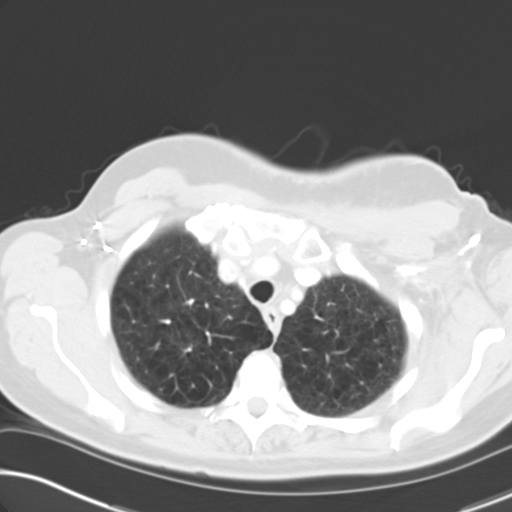
[im 81/87  soft-tissue]
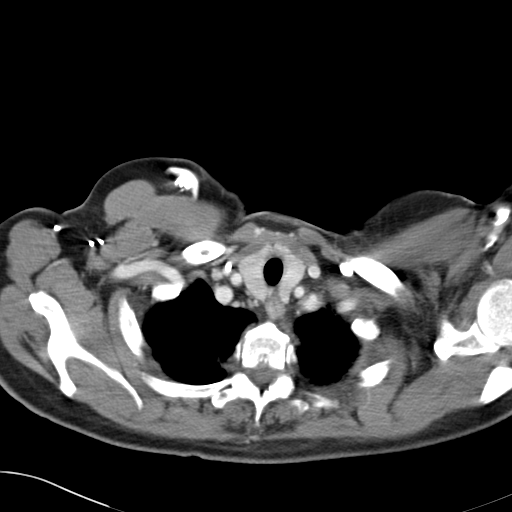
[im 81/87  lung]
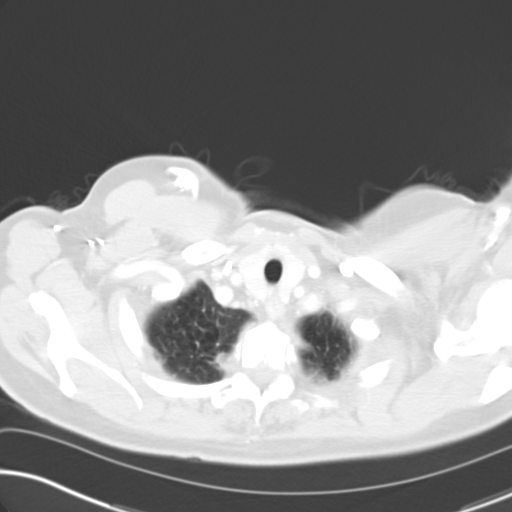

[12 of 32 positions shown; findings below may reference images not displayed]

FINDINGS: CT CHEST

Mediastinum/Nodes: Normal heart size. No pericardial
fluid/thickening. Right internal jugular MediPort terminates at the
cavoatrial junction. Great vessels are normal in course and caliber.
No central pulmonary emboli. Normal visualized thyroid. Normal
esophagus. Right axillary nodal clips are again noted. Status post
right mastectomy. No right axillary adenopathy or right chest wall
mass. Abnormal infiltrative soft tissue density encasing the left
axillary artery (series 2/ image 11) is not appreciably changed.
Superficial left lateral breast 2.9 x 2.2 cm heterogeneously
enhancing mass with dermal invasion (series 2/image 32) is mildly
increased from 2.3 x 2.0 cm. Superficial lateral left chest wall
x 1.8 cm mass with dermal invasion superficially and muscle invasion
at the deep margin (Series 2/image 42) is increased from 1.7 x
cm. There is continuous irregular abnormal skin thickening
throughout the left anterior and lateral chest wall (series 2/image
43), not appreciably changed. No pathologically enlarged mediastinal
or hilar nodes.

Lungs/Pleura: No pneumothorax. No pleural effusion. There is no
appreciable change in focal pleural thickening in the anterior left
mid pleural space (series 2/ image 31), measuring up to 6 mm in
thickness. No focal pleural nodule. Mild-to-moderate centrilobular
emphysema. No acute consolidative airspace disease, significant
pulmonary nodules or lung masses.

Musculoskeletal: No aggressive appearing focal osseous lesions.
Minimal degenerative changes in the upper thoracic spine.

CT ABDOMEN

Hepatobiliary: There is a solitary hypodense 0.5 cm lesion in the
anterior left liver lobe (series 2/ image 53), stable since
04/27/2012 and benign. No new liver lesions. Normal gallbladder with
no radiopaque cholelithiasis. No biliary ductal dilatation.

Pancreas: Normal, with no mass or duct dilation.

Spleen: Normal size. No mass.

Adrenals/Urinary Tract: Normal adrenals. Normal kidneys with no
hydronephrosis and no renal mass.

Stomach/Bowel: Grossly normal stomach. Visualized small and large
bowel is normal caliber, with no bowel wall thickening.

Vascular/Lymphatic: Atherosclerotic nonaneurysmal abdominal aorta.
Patent portal, splenic, hepatic and renal veins. No pathologically
enlarged lymph nodes in the abdomen.

Other: No pneumoperitoneum, ascites or focal fluid collection.

Musculoskeletal: No aggressive appearing focal osseous lesions.
Moderate degenerative changes in the lower lumbar spine.
IMPRESSION: 1. Interval growth of superficial tumors in the lateral left breast
and lateral left chest wall.
2. Otherwise stable findings of locally infiltrative tumor in the
left chest wall and left axilla as described, including extensive
left chest wall skin thickening, infiltrative soft tissue encasing
the left axillary artery and focal pleural thickening in the
anterior left mid pleural space.
3. No new sites of metastatic disease in the chest. No evidence of
metastatic disease in the abdomen.

## 2017-05-30 IMAGING — CR DG CHEST 2V
1 series · 2 of 2 positions shown · non-contrast
Comparison: Portable chest x-ray December 18, 2015

CLINICAL DATA: Follow-up left lung mass. Increasing shortness of
breath over the past 2 days. No chest pain. History of mastectomy,
emphysema, former smoker. Patient has a right-sided PleurX catheter.

EXAM:
CHEST  2 VIEW

[Series 5: w chest pa · 0.14mm/px · 2 of 2 slices shown]
[im 1/2]
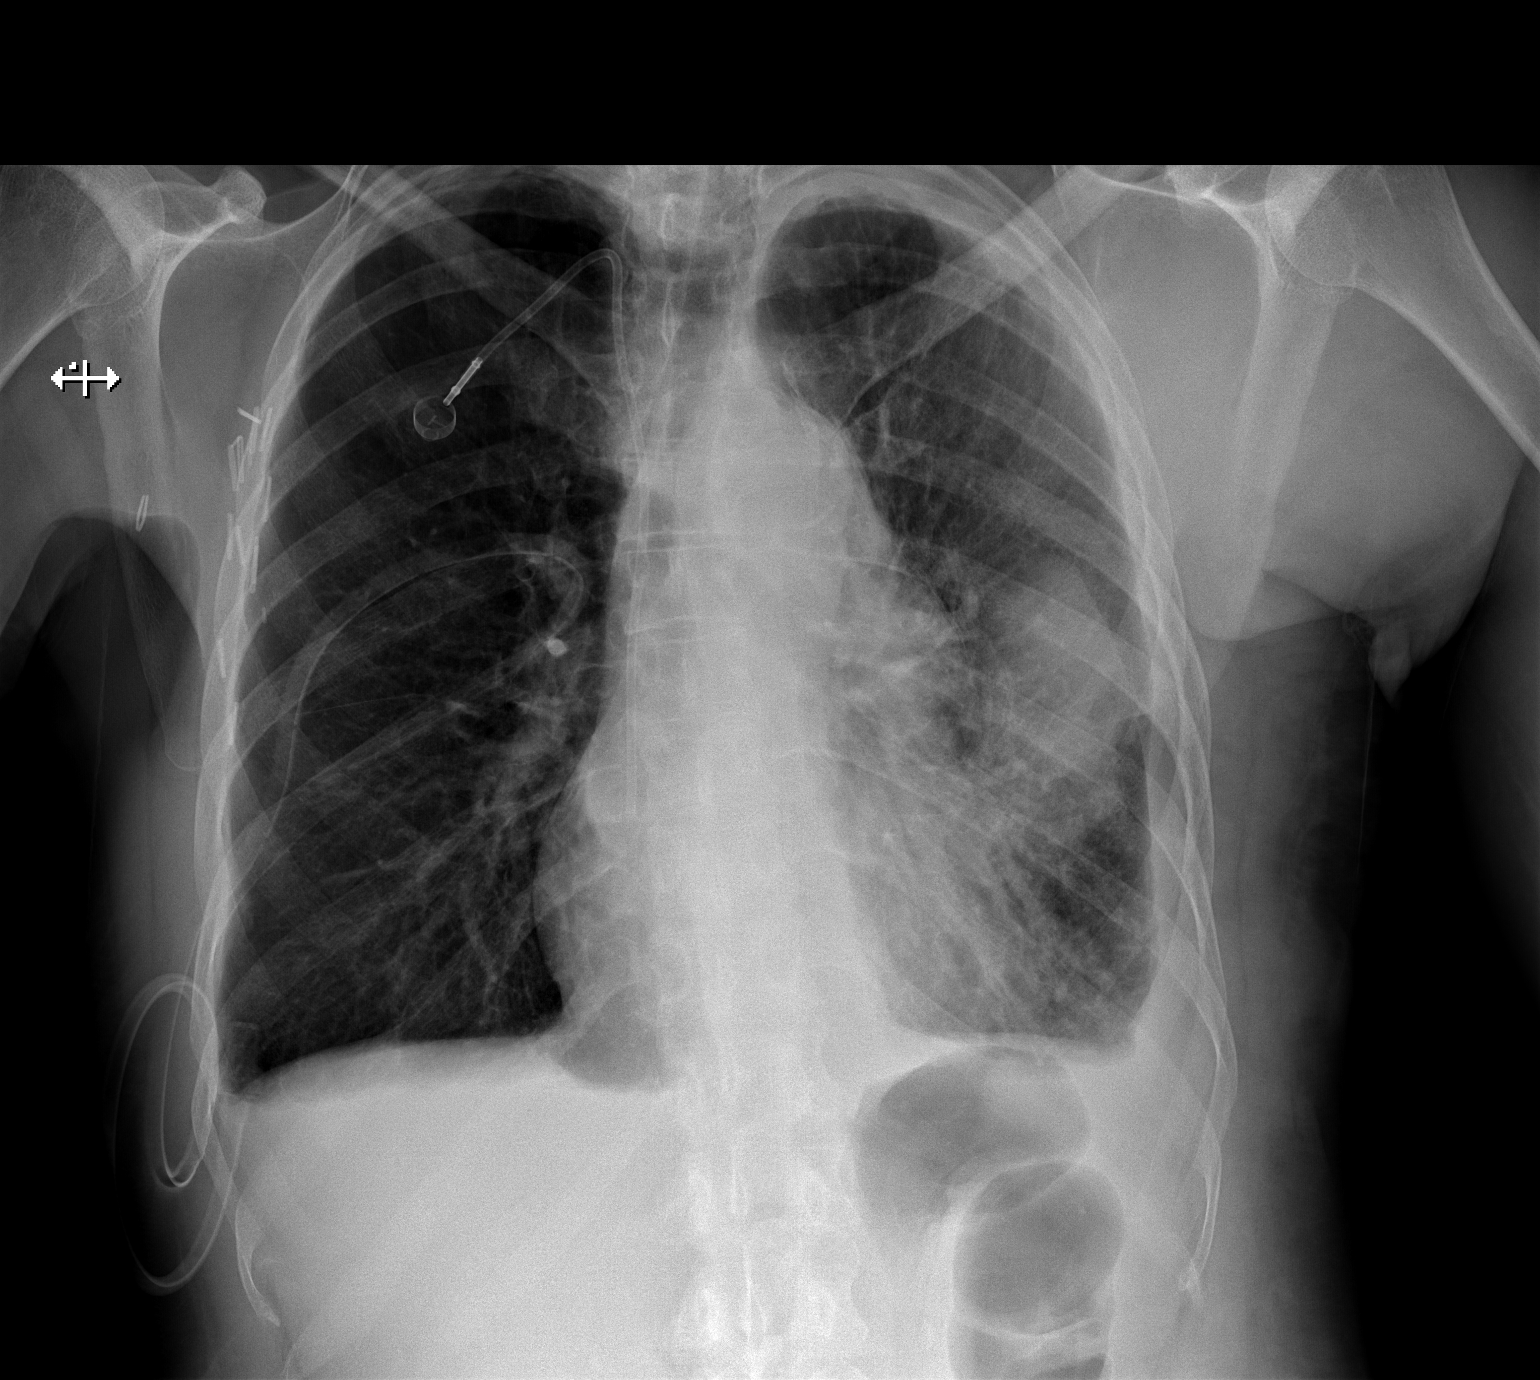
[im 2/2]
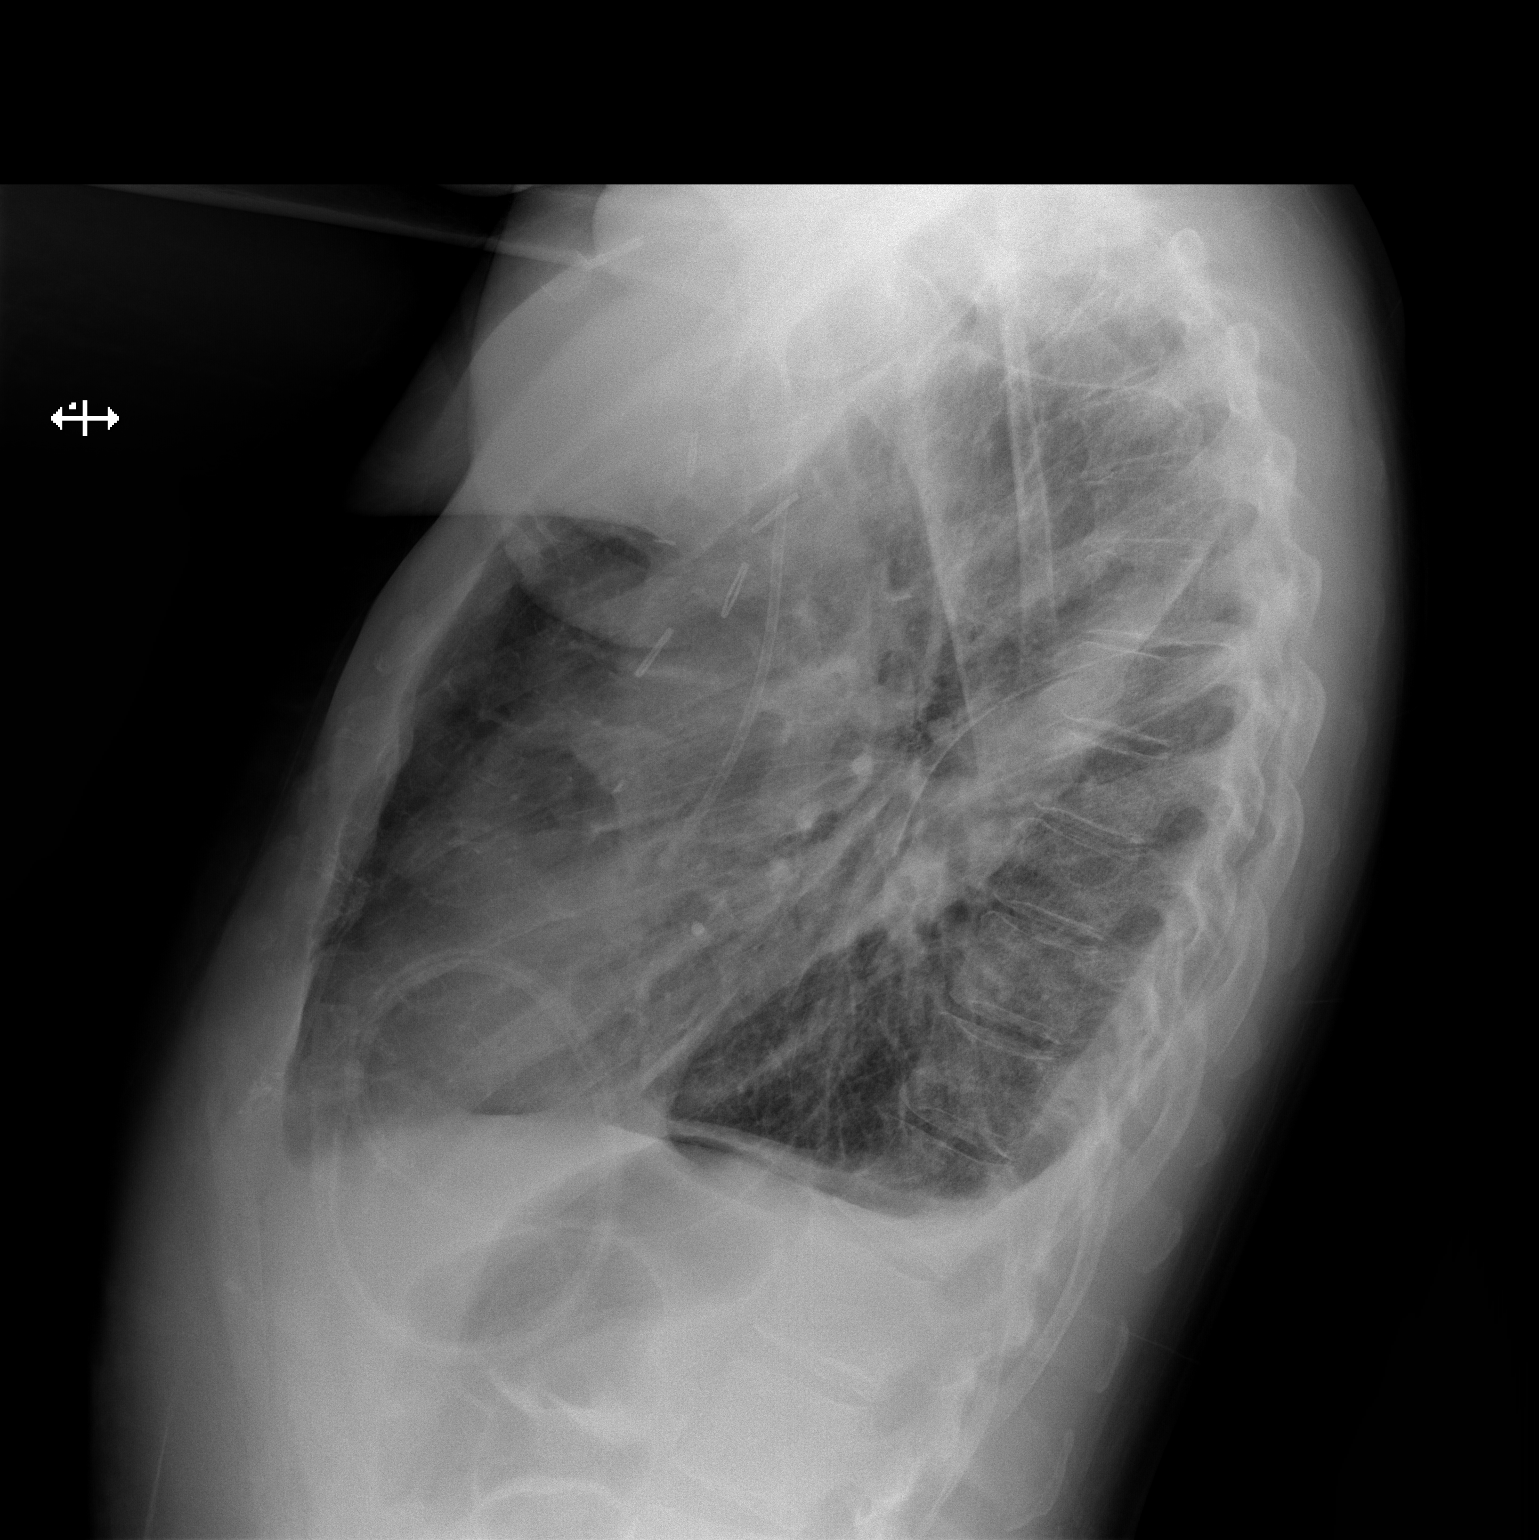

[2 of 2 positions shown; findings below may reference images not displayed]

FINDINGS: The right lung is mildly hyperinflated. A trace of pleural fluid at
the right lung base is present. There is no pneumothorax. The PleurX
catheter is in stable position. On the left aeration of the lung has
improved. The interstitial markings have decreased. There is a
persistent mass in the left mid lung. There is a small left pleural
effusion. The cardiac silhouette is normal in size. The pulmonary
vascularity is not engorged. There is calcification in the wall of
the aortic arch. The Port-A-Cath appliance tip projects over the
distal third of the SVC. There surgical clips in the right axillary
region from previous lymph node dissection.
IMPRESSION: 1. Improved appearance of the left lung with decreased interstitial
edema or pneumonia. Persistent mass in the left mid lung. Small left
pleural effusion, stable.
2. Hyperinflation of the right lung. Small pleural effusion layering
at the lung base. Stable PleurX catheter. No pneumothorax.
3. Aortic atherosclerosis.
